# Patient Record
Sex: Female | Born: 1955
Health system: Southern US, Community
[De-identification: ages and names within clinical notes are randomized; demographics above are authoritative.]

## PROBLEM LIST (undated history)

## (undated) DIAGNOSIS — E785 Hyperlipidemia, unspecified: Secondary | ICD-10-CM

## (undated) DIAGNOSIS — K219 Gastro-esophageal reflux disease without esophagitis: Secondary | ICD-10-CM

## (undated) DIAGNOSIS — I2510 Atherosclerotic heart disease of native coronary artery without angina pectoris: Secondary | ICD-10-CM

## (undated) DIAGNOSIS — N3010 Interstitial cystitis (chronic) without hematuria: Secondary | ICD-10-CM

## (undated) DIAGNOSIS — E039 Hypothyroidism, unspecified: Secondary | ICD-10-CM

## (undated) DIAGNOSIS — I639 Cerebral infarction, unspecified: Secondary | ICD-10-CM

## (undated) DIAGNOSIS — I219 Acute myocardial infarction, unspecified: Secondary | ICD-10-CM

## (undated) DIAGNOSIS — F319 Bipolar disorder, unspecified: Secondary | ICD-10-CM

## (undated) DIAGNOSIS — Z972 Presence of dental prosthetic device (complete) (partial): Secondary | ICD-10-CM

## (undated) DIAGNOSIS — I10 Essential (primary) hypertension: Secondary | ICD-10-CM

## (undated) DIAGNOSIS — Z974 Presence of external hearing-aid: Secondary | ICD-10-CM

## (undated) HISTORY — PX: HYSTEROTOMY: SHX1776

## (undated) HISTORY — PX: INTERSTIM IMPLANT PLACEMENT: SHX5130

## (undated) HISTORY — PX: APPENDECTOMY: SHX54

## (undated) HISTORY — PX: ABDOMINAL HYSTERECTOMY: SHX81

---

## 1998-01-16 ENCOUNTER — Ambulatory Visit (HOSPITAL_COMMUNITY): Admission: RE | Admit: 1998-01-16 | Discharge: 1998-01-16 | Payer: Self-pay | Admitting: *Deleted

## 2003-12-16 ENCOUNTER — Other Ambulatory Visit: Payer: Self-pay

## 2007-10-05 ENCOUNTER — Other Ambulatory Visit: Payer: Self-pay

## 2007-10-05 ENCOUNTER — Inpatient Hospital Stay: Payer: Self-pay | Admitting: Internal Medicine

## 2007-12-31 ENCOUNTER — Emergency Department: Payer: Self-pay | Admitting: Emergency Medicine

## 2008-04-23 ENCOUNTER — Inpatient Hospital Stay: Payer: Self-pay | Admitting: Unknown Physician Specialty

## 2008-05-06 ENCOUNTER — Ambulatory Visit: Payer: Self-pay | Admitting: Unknown Physician Specialty

## 2008-05-07 ENCOUNTER — Ambulatory Visit: Payer: Self-pay | Admitting: Unknown Physician Specialty

## 2008-05-27 ENCOUNTER — Ambulatory Visit: Payer: Self-pay | Admitting: Unknown Physician Specialty

## 2008-05-30 ENCOUNTER — Ambulatory Visit: Payer: Self-pay | Admitting: Psychiatry

## 2008-10-24 ENCOUNTER — Ambulatory Visit: Payer: Self-pay | Admitting: Psychiatry

## 2008-11-02 ENCOUNTER — Ambulatory Visit: Payer: Self-pay | Admitting: Psychiatry

## 2008-11-06 ENCOUNTER — Inpatient Hospital Stay: Payer: Self-pay | Admitting: Psychiatry

## 2009-02-08 ENCOUNTER — Other Ambulatory Visit: Payer: Self-pay | Admitting: Psychiatry

## 2009-08-11 ENCOUNTER — Ambulatory Visit: Payer: Self-pay | Admitting: Otolaryngology

## 2010-03-18 ENCOUNTER — Ambulatory Visit: Payer: Self-pay | Admitting: Otolaryngology

## 2010-07-04 ENCOUNTER — Ambulatory Visit: Payer: Self-pay | Admitting: Family Medicine

## 2010-09-07 ENCOUNTER — Emergency Department: Payer: Self-pay | Admitting: Emergency Medicine

## 2010-09-08 ENCOUNTER — Emergency Department: Payer: Self-pay | Admitting: Emergency Medicine

## 2010-12-13 ENCOUNTER — Other Ambulatory Visit: Payer: Self-pay

## 2011-01-21 ENCOUNTER — Inpatient Hospital Stay: Payer: Self-pay | Admitting: Psychiatry

## 2011-04-27 ENCOUNTER — Other Ambulatory Visit: Payer: Self-pay | Admitting: Anesthesiology

## 2011-10-26 ENCOUNTER — Other Ambulatory Visit: Payer: Self-pay | Admitting: Family Medicine

## 2011-10-26 LAB — COMPREHENSIVE METABOLIC PANEL
Albumin: 4.1 g/dL (ref 3.4–5.0)
Alkaline Phosphatase: 108 U/L (ref 50–136)
Anion Gap: 10 (ref 7–16)
BUN: 8 mg/dL (ref 7–18)
Bilirubin,Total: 0.4 mg/dL (ref 0.2–1.0)
Co2: 28 mmol/L (ref 21–32)
EGFR (Non-African Amer.): >60
Glucose: 82 mg/dL (ref 65–99)
Osmolality: 279 (ref 275–301)
Potassium: 4.6 mmol/L (ref 3.5–5.1)
Sodium: 141 mmol/L (ref 136–145)

## 2011-10-26 LAB — LIPID PANEL: Cholesterol: 293 mg/dL — ABNORMAL HIGH (ref 0–200)

## 2011-12-20 ENCOUNTER — Emergency Department: Payer: Self-pay | Admitting: Emergency Medicine

## 2012-02-22 ENCOUNTER — Ambulatory Visit: Payer: Self-pay

## 2012-06-14 ENCOUNTER — Emergency Department: Payer: Self-pay

## 2012-07-21 DIAGNOSIS — G5750 Tarsal tunnel syndrome, unspecified lower limb: Secondary | ICD-10-CM | POA: Insufficient documentation

## 2012-07-21 DIAGNOSIS — G622 Polyneuropathy due to other toxic agents: Secondary | ICD-10-CM | POA: Insufficient documentation

## 2012-07-21 DIAGNOSIS — G619 Inflammatory polyneuropathy, unspecified: Secondary | ICD-10-CM | POA: Insufficient documentation

## 2012-07-21 DIAGNOSIS — M722 Plantar fascial fibromatosis: Secondary | ICD-10-CM | POA: Insufficient documentation

## 2012-08-25 ENCOUNTER — Inpatient Hospital Stay: Payer: Self-pay | Admitting: Psychiatry

## 2012-08-25 LAB — DRUG SCREEN, URINE
Amphetamines, Ur Screen: NEGATIVE (ref ?–1000)
Cannabinoid 50 Ng, Ur ~~LOC~~: NEGATIVE (ref ?–50)
Cocaine Metabolite,Ur ~~LOC~~: NEGATIVE (ref ?–300)
Methadone, Ur Screen: NEGATIVE (ref ?–300)
Opiate, Ur Screen: NEGATIVE (ref ?–300)

## 2012-08-25 LAB — COMPREHENSIVE METABOLIC PANEL
Albumin: 4.2 g/dL (ref 3.4–5.0)
Alkaline Phosphatase: 142 U/L — ABNORMAL HIGH (ref 50–136)
Anion Gap: 5 — ABNORMAL LOW (ref 7–16)
BUN: 7 mg/dL (ref 7–18)
Chloride: 108 mmol/L — ABNORMAL HIGH (ref 98–107)
Co2: 27 mmol/L (ref 21–32)
Creatinine: 0.87 mg/dL (ref 0.60–1.30)
EGFR (Non-African Amer.): >60
Glucose: 108 mg/dL — ABNORMAL HIGH (ref 65–99)
Osmolality: 278 (ref 275–301)
Potassium: 4.1 mmol/L (ref 3.5–5.1)
SGPT (ALT): 23 U/L (ref 12–78)

## 2012-08-25 LAB — SALICYLATE LEVEL: Salicylates, Serum: <1.7 mg/dL

## 2012-08-25 LAB — URINALYSIS, COMPLETE
Bilirubin,UR: NEGATIVE
Blood: NEGATIVE
Glucose,UR: NEGATIVE mg/dL (ref 0–75)
Leukocyte Esterase: NEGATIVE
Nitrite: NEGATIVE
Ph: 7 (ref 4.5–8.0)
Protein: NEGATIVE
Squamous Epithelial: 3
WBC UR: 1 /HPF (ref 0–5)

## 2012-08-25 LAB — CBC
HCT: 39.1 % (ref 35.0–47.0)
MCH: 31.5 pg (ref 26.0–34.0)
MCHC: 33.2 g/dL (ref 32.0–36.0)
MCV: 95 fL (ref 80–100)
Platelet: 274 10*3/uL (ref 150–440)
WBC: 12.4 10*3/uL — ABNORMAL HIGH (ref 3.6–11.0)

## 2012-08-25 LAB — ETHANOL: Ethanol %: <0.003 % (ref 0.000–0.080)

## 2012-08-25 LAB — TSH: Thyroid Stimulating Horm: 4.39 u[IU]/mL

## 2012-08-25 LAB — ACETAMINOPHEN LEVEL: Acetaminophen: <2 ug/mL

## 2012-09-01 LAB — LITHIUM LEVEL: Lithium: 1.72 mmol/L

## 2012-09-01 LAB — LIPID PANEL
Cholesterol: 174 mg/dL (ref 0–200)
Ldl Cholesterol, Calc: 106 mg/dL — ABNORMAL HIGH (ref 0–100)
Triglycerides: 113 mg/dL (ref 0–200)
VLDL Cholesterol, Calc: 23 mg/dL (ref 5–40)

## 2012-09-04 LAB — BASIC METABOLIC PANEL
BUN: 15 mg/dL (ref 7–18)
Co2: 27 mmol/L (ref 21–32)
Creatinine: 0.9 mg/dL (ref 0.60–1.30)
EGFR (Non-African Amer.): >60
Glucose: 97 mg/dL (ref 65–99)
Osmolality: 278 (ref 275–301)
Sodium: 139 mmol/L (ref 136–145)

## 2013-01-02 DIAGNOSIS — M26609 Unspecified temporomandibular joint disorder, unspecified side: Secondary | ICD-10-CM | POA: Insufficient documentation

## 2013-01-19 DIAGNOSIS — Z720 Tobacco use: Secondary | ICD-10-CM | POA: Insufficient documentation

## 2013-02-11 ENCOUNTER — Emergency Department: Payer: Self-pay | Admitting: Unknown Physician Specialty

## 2013-05-25 ENCOUNTER — Inpatient Hospital Stay: Payer: Self-pay | Admitting: Psychiatry

## 2013-05-25 LAB — URINALYSIS, COMPLETE
Bacteria: NONE SEEN
Ketone: NEGATIVE
Leukocyte Esterase: NEGATIVE
Nitrite: NEGATIVE
Ph: 7 (ref 4.5–8.0)
Protein: NEGATIVE
Specific Gravity: 1.004 (ref 1.003–1.030)
WBC UR: 1 /HPF (ref 0–5)

## 2013-05-25 LAB — DRUG SCREEN, URINE
Amphetamines, Ur Screen: NEGATIVE (ref ?–1000)
Barbiturates, Ur Screen: NEGATIVE (ref ?–200)
Cannabinoid 50 Ng, Ur ~~LOC~~: NEGATIVE (ref ?–50)
Cocaine Metabolite,Ur ~~LOC~~: NEGATIVE (ref ?–300)
Methadone, Ur Screen: NEGATIVE (ref ?–300)

## 2013-05-25 LAB — COMPREHENSIVE METABOLIC PANEL
Albumin: 3.8 g/dL (ref 3.4–5.0)
BUN: 5 mg/dL — ABNORMAL LOW (ref 7–18)
Calcium, Total: 9.6 mg/dL (ref 8.5–10.1)
Creatinine: 0.7 mg/dL (ref 0.60–1.30)
EGFR (African American): >60
EGFR (Non-African Amer.): >60
Glucose: 96 mg/dL (ref 65–99)
Potassium: 4.3 mmol/L (ref 3.5–5.1)
SGOT(AST): 22 U/L (ref 15–37)
SGPT (ALT): 20 U/L (ref 12–78)
Sodium: 142 mmol/L (ref 136–145)

## 2013-05-25 LAB — ACETAMINOPHEN LEVEL: Acetaminophen: <2 ug/mL

## 2013-05-25 LAB — CBC
HCT: 37.5 % (ref 35.0–47.0)
HGB: 12.8 g/dL (ref 12.0–16.0)
MCH: 31.6 pg (ref 26.0–34.0)
MCHC: 34.1 g/dL (ref 32.0–36.0)
MCV: 93 fL (ref 80–100)
Platelet: 194 10*3/uL (ref 150–440)
RBC: 4.04 10*6/uL (ref 3.80–5.20)

## 2013-05-25 LAB — ETHANOL
Ethanol %: <0.003 % (ref 0.000–0.080)
Ethanol: <3 mg/dL

## 2013-05-25 LAB — LITHIUM LEVEL: Lithium: 0.31 mmol/L — ABNORMAL LOW

## 2013-05-25 LAB — TSH: Thyroid Stimulating Horm: 1.33 u[IU]/mL

## 2013-05-25 LAB — SALICYLATE LEVEL: Salicylates, Serum: 2.5 mg/dL

## 2013-05-29 LAB — LITHIUM LEVEL: Lithium: 1.52 mmol/L

## 2013-05-31 LAB — BASIC METABOLIC PANEL
BUN: 19 mg/dL — ABNORMAL HIGH (ref 7–18)
Calcium, Total: 9.4 mg/dL (ref 8.5–10.1)
Chloride: 109 mmol/L — ABNORMAL HIGH (ref 98–107)
Co2: 28 mmol/L (ref 21–32)
Glucose: 87 mg/dL (ref 65–99)
Osmolality: 281 (ref 275–301)
Sodium: 140 mmol/L (ref 136–145)

## 2013-05-31 LAB — LITHIUM LEVEL: Lithium: 1.26 mmol/L — ABNORMAL HIGH

## 2013-06-21 ENCOUNTER — Inpatient Hospital Stay: Payer: Self-pay | Admitting: Psychiatry

## 2013-06-21 LAB — SALICYLATE LEVEL: Salicylates, Serum: 2.8 mg/dL

## 2013-06-21 LAB — ETHANOL
Ethanol %: <0.003 % (ref 0.000–0.080)
Ethanol: <3 mg/dL

## 2013-06-21 LAB — CBC
HCT: 39.2 % (ref 35.0–47.0)
MCV: 92 fL (ref 80–100)
Platelet: 199 10*3/uL (ref 150–440)
RDW: 13.2 % (ref 11.5–14.5)
WBC: 8.7 10*3/uL (ref 3.6–11.0)

## 2013-06-21 LAB — COMPREHENSIVE METABOLIC PANEL
Albumin: 3.9 g/dL (ref 3.4–5.0)
Anion Gap: 2 — ABNORMAL LOW (ref 7–16)
BUN: 13 mg/dL (ref 7–18)
Bilirubin,Total: 0.4 mg/dL (ref 0.2–1.0)
Calcium, Total: 10 mg/dL (ref 8.5–10.1)
Co2: 26 mmol/L (ref 21–32)
Creatinine: 0.88 mg/dL (ref 0.60–1.30)
EGFR (African American): >60
EGFR (Non-African Amer.): >60
Osmolality: 272 (ref 275–301)
Potassium: 4 mmol/L (ref 3.5–5.1)
SGOT(AST): 19 U/L (ref 15–37)
Sodium: 136 mmol/L (ref 136–145)
Total Protein: 7.5 g/dL (ref 6.4–8.2)

## 2013-06-21 LAB — ACETAMINOPHEN LEVEL: Acetaminophen: <2 ug/mL

## 2013-06-21 LAB — URINALYSIS, COMPLETE
Bilirubin,UR: NEGATIVE
Ketone: NEGATIVE
Protein: NEGATIVE
Squamous Epithelial: 4
WBC UR: <1 /HPF (ref 0–5)

## 2013-06-21 LAB — DRUG SCREEN, URINE
Amphetamines, Ur Screen: NEGATIVE (ref ?–1000)
Barbiturates, Ur Screen: NEGATIVE (ref ?–200)
Benzodiazepine, Ur Scrn: NEGATIVE (ref ?–200)
Cannabinoid 50 Ng, Ur ~~LOC~~: NEGATIVE (ref ?–50)
Methadone, Ur Screen: NEGATIVE (ref ?–300)
Opiate, Ur Screen: NEGATIVE (ref ?–300)
Phencyclidine (PCP) Ur S: NEGATIVE (ref ?–25)

## 2013-06-21 LAB — MAGNESIUM: Magnesium: 2 mg/dL

## 2013-06-21 LAB — LITHIUM LEVEL: Lithium: 1.27 mmol/L — ABNORMAL HIGH

## 2013-06-23 LAB — LITHIUM LEVEL: Lithium: 0.34 mmol/L — ABNORMAL LOW

## 2013-10-26 DIAGNOSIS — F315 Bipolar disorder, current episode depressed, severe, with psychotic features: Secondary | ICD-10-CM | POA: Diagnosis not present

## 2013-11-08 DIAGNOSIS — IMO0001 Reserved for inherently not codable concepts without codable children: Secondary | ICD-10-CM | POA: Diagnosis not present

## 2013-11-08 DIAGNOSIS — F17200 Nicotine dependence, unspecified, uncomplicated: Secondary | ICD-10-CM | POA: Diagnosis not present

## 2013-11-08 DIAGNOSIS — M2550 Pain in unspecified joint: Secondary | ICD-10-CM | POA: Diagnosis not present

## 2013-11-08 DIAGNOSIS — E782 Mixed hyperlipidemia: Secondary | ICD-10-CM | POA: Diagnosis not present

## 2013-11-08 DIAGNOSIS — I10 Essential (primary) hypertension: Secondary | ICD-10-CM | POA: Diagnosis not present

## 2013-11-08 DIAGNOSIS — F339 Major depressive disorder, recurrent, unspecified: Secondary | ICD-10-CM | POA: Diagnosis not present

## 2013-11-09 ENCOUNTER — Other Ambulatory Visit: Payer: Self-pay | Admitting: Physician Assistant

## 2013-11-09 LAB — CBC WITH DIFFERENTIAL/PLATELET
BASOS PCT: 0.7 %
Basophil #: 0.1 10*3/uL (ref 0.0–0.1)
EOS PCT: 2.1 %
Eosinophil #: 0.2 10*3/uL (ref 0.0–0.7)
HCT: 35.5 % (ref 35.0–47.0)
HGB: 11.4 g/dL — AB (ref 12.0–16.0)
Lymphocyte #: 1.5 10*3/uL (ref 1.0–3.6)
Lymphocyte %: 19.7 %
MCH: 30.2 pg (ref 26.0–34.0)
MCHC: 32.1 g/dL (ref 32.0–36.0)
MCV: 94 fL (ref 80–100)
Monocyte #: 0.5 x10 3/mm (ref 0.2–0.9)
Monocyte %: 6.1 %
NEUTROS ABS: 5.6 10*3/uL (ref 1.4–6.5)
NEUTROS PCT: 71.4 %
PLATELETS: 193 10*3/uL (ref 150–440)
RBC: 3.77 10*6/uL — ABNORMAL LOW (ref 3.80–5.20)
RDW: 13.9 % (ref 11.5–14.5)
WBC: 7.8 10*3/uL (ref 3.6–11.0)

## 2013-11-09 LAB — COMPREHENSIVE METABOLIC PANEL
Albumin: 3.8 g/dL (ref 3.4–5.0)
Alkaline Phosphatase: 152 U/L — ABNORMAL HIGH
Anion Gap: 4 — ABNORMAL LOW (ref 7–16)
BUN: 11 mg/dL (ref 7–18)
Bilirubin,Total: 0.4 mg/dL (ref 0.2–1.0)
CHLORIDE: 107 mmol/L (ref 98–107)
CREATININE: 0.94 mg/dL (ref 0.60–1.30)
Calcium, Total: 9.8 mg/dL (ref 8.5–10.1)
Co2: 26 mmol/L (ref 21–32)
Glucose: 88 mg/dL (ref 65–99)
OSMOLALITY: 273 (ref 275–301)
Potassium: 4.4 mmol/L (ref 3.5–5.1)
SGOT(AST): 16 U/L (ref 15–37)
SGPT (ALT): 16 U/L (ref 12–78)
SODIUM: 137 mmol/L (ref 136–145)
Total Protein: 7.4 g/dL (ref 6.4–8.2)

## 2013-11-09 LAB — LIPID PANEL
CHOLESTEROL: 222 mg/dL — AB (ref 0–200)
HDL Cholesterol: 46 mg/dL (ref 40–60)
Ldl Cholesterol, Calc: 148 mg/dL — ABNORMAL HIGH (ref 0–100)
Triglycerides: 139 mg/dL (ref 0–200)
VLDL Cholesterol, Calc: 28 mg/dL (ref 5–40)

## 2013-11-09 LAB — LITHIUM LEVEL: LITHIUM: 0.87 mmol/L

## 2013-11-09 LAB — SEDIMENTATION RATE: Erythrocyte Sed Rate: 34 mm/hr — ABNORMAL HIGH (ref 0–30)

## 2013-11-09 LAB — TSH: Thyroid Stimulating Horm: 1.32 u[IU]/mL

## 2013-11-10 DIAGNOSIS — I10 Essential (primary) hypertension: Secondary | ICD-10-CM | POA: Diagnosis not present

## 2013-11-10 DIAGNOSIS — R079 Chest pain, unspecified: Secondary | ICD-10-CM | POA: Diagnosis not present

## 2013-11-10 DIAGNOSIS — R52 Pain, unspecified: Secondary | ICD-10-CM | POA: Diagnosis not present

## 2013-11-10 DIAGNOSIS — E785 Hyperlipidemia, unspecified: Secondary | ICD-10-CM | POA: Diagnosis not present

## 2013-11-10 DIAGNOSIS — R0789 Other chest pain: Secondary | ICD-10-CM | POA: Diagnosis not present

## 2013-11-10 LAB — TROPONIN I
Troponin-I: 0.04 ng/mL
Troponin-I: 0.63 ng/mL — ABNORMAL HIGH

## 2013-11-10 LAB — BASIC METABOLIC PANEL
Anion Gap: 5 — ABNORMAL LOW (ref 7–16)
BUN: 15 mg/dL (ref 7–18)
CHLORIDE: 108 mmol/L — AB (ref 98–107)
CO2: 22 mmol/L (ref 21–32)
CREATININE: 0.92 mg/dL (ref 0.60–1.30)
Calcium, Total: 9.1 mg/dL (ref 8.5–10.1)
GLUCOSE: 97 mg/dL (ref 65–99)
OSMOLALITY: 271 (ref 275–301)
POTASSIUM: 4.1 mmol/L (ref 3.5–5.1)
Sodium: 135 mmol/L — ABNORMAL LOW (ref 136–145)

## 2013-11-10 LAB — CK-MB: CK-MB: 10.7 ng/mL — AB (ref 0.5–3.6)

## 2013-11-10 LAB — LIPASE, BLOOD: LIPASE: 239 U/L (ref 73–393)

## 2013-11-10 LAB — CBC
HCT: 36.6 % (ref 35.0–47.0)
HGB: 12 g/dL (ref 12.0–16.0)
MCH: 31 pg (ref 26.0–34.0)
MCHC: 32.8 g/dL (ref 32.0–36.0)
MCV: 95 fL (ref 80–100)
PLATELETS: 214 10*3/uL (ref 150–440)
RBC: 3.86 10*6/uL (ref 3.80–5.20)
RDW: 14.2 % (ref 11.5–14.5)
WBC: 10.1 10*3/uL (ref 3.6–11.0)

## 2013-11-10 LAB — CK TOTAL AND CKMB (NOT AT ARMC)
CK, TOTAL: 55 U/L
CK-MB: 1.3 ng/mL (ref 0.5–3.6)

## 2013-11-11 ENCOUNTER — Inpatient Hospital Stay: Payer: Self-pay | Admitting: Specialist

## 2013-11-11 DIAGNOSIS — I10 Essential (primary) hypertension: Secondary | ICD-10-CM | POA: Diagnosis not present

## 2013-11-11 DIAGNOSIS — R0789 Other chest pain: Secondary | ICD-10-CM | POA: Diagnosis not present

## 2013-11-11 DIAGNOSIS — E039 Hypothyroidism, unspecified: Secondary | ICD-10-CM | POA: Diagnosis present

## 2013-11-11 DIAGNOSIS — Z886 Allergy status to analgesic agent status: Secondary | ICD-10-CM | POA: Diagnosis not present

## 2013-11-11 DIAGNOSIS — F17200 Nicotine dependence, unspecified, uncomplicated: Secondary | ICD-10-CM | POA: Diagnosis present

## 2013-11-11 DIAGNOSIS — I2510 Atherosclerotic heart disease of native coronary artery without angina pectoris: Secondary | ICD-10-CM | POA: Diagnosis not present

## 2013-11-11 DIAGNOSIS — F319 Bipolar disorder, unspecified: Secondary | ICD-10-CM | POA: Diagnosis present

## 2013-11-11 DIAGNOSIS — R079 Chest pain, unspecified: Secondary | ICD-10-CM

## 2013-11-11 DIAGNOSIS — E785 Hyperlipidemia, unspecified: Secondary | ICD-10-CM | POA: Diagnosis not present

## 2013-11-11 DIAGNOSIS — R52 Pain, unspecified: Secondary | ICD-10-CM | POA: Diagnosis not present

## 2013-11-11 DIAGNOSIS — I214 Non-ST elevation (NSTEMI) myocardial infarction: Secondary | ICD-10-CM | POA: Diagnosis not present

## 2013-11-11 DIAGNOSIS — I2582 Chronic total occlusion of coronary artery: Secondary | ICD-10-CM | POA: Diagnosis present

## 2013-11-11 LAB — LIPID PANEL
Cholesterol: 190 mg/dL (ref 0–200)
HDL Cholesterol: 40 mg/dL (ref 40–60)
LDL CHOLESTEROL, CALC: 113 mg/dL — AB (ref 0–100)
TRIGLYCERIDES: 183 mg/dL (ref 0–200)
VLDL Cholesterol, Calc: 37 mg/dL (ref 5–40)

## 2013-11-11 LAB — CK-MB
CK-MB: 33.7 ng/mL — AB (ref 0.5–3.6)
CK-MB: 49.5 ng/mL — ABNORMAL HIGH (ref 0.5–3.6)

## 2013-11-11 LAB — TROPONIN I: Troponin-I: 2.6 ng/mL — ABNORMAL HIGH

## 2013-11-12 LAB — BASIC METABOLIC PANEL
ANION GAP: 1 — AB (ref 7–16)
BUN: 17 mg/dL (ref 7–18)
CALCIUM: 9.4 mg/dL (ref 8.5–10.1)
CHLORIDE: 106 mmol/L (ref 98–107)
CREATININE: 1.07 mg/dL (ref 0.60–1.30)
Co2: 29 mmol/L (ref 21–32)
EGFR (African American): >60
EGFR (Non-African Amer.): 57 — ABNORMAL LOW
Glucose: 91 mg/dL (ref 65–99)
Osmolality: 273 (ref 275–301)
Potassium: 4 mmol/L (ref 3.5–5.1)
Sodium: 136 mmol/L (ref 136–145)

## 2013-11-12 LAB — CBC
HCT: 35.4 % (ref 35.0–47.0)
HGB: 11.6 g/dL — ABNORMAL LOW (ref 12.0–16.0)
MCH: 30.6 pg (ref 26.0–34.0)
MCHC: 32.7 g/dL (ref 32.0–36.0)
MCV: 94 fL (ref 80–100)
Platelet: 211 10*3/uL (ref 150–440)
RBC: 3.78 10*6/uL — ABNORMAL LOW (ref 3.80–5.20)
RDW: 14.5 % (ref 11.5–14.5)
WBC: 9.5 10*3/uL (ref 3.6–11.0)

## 2013-11-29 DIAGNOSIS — I10 Essential (primary) hypertension: Secondary | ICD-10-CM | POA: Diagnosis not present

## 2013-11-29 DIAGNOSIS — I2510 Atherosclerotic heart disease of native coronary artery without angina pectoris: Secondary | ICD-10-CM | POA: Diagnosis not present

## 2013-12-13 DIAGNOSIS — F17200 Nicotine dependence, unspecified, uncomplicated: Secondary | ICD-10-CM | POA: Diagnosis not present

## 2013-12-13 DIAGNOSIS — F339 Major depressive disorder, recurrent, unspecified: Secondary | ICD-10-CM | POA: Diagnosis not present

## 2013-12-13 DIAGNOSIS — IMO0001 Reserved for inherently not codable concepts without codable children: Secondary | ICD-10-CM | POA: Diagnosis not present

## 2013-12-13 DIAGNOSIS — I10 Essential (primary) hypertension: Secondary | ICD-10-CM | POA: Diagnosis not present

## 2013-12-13 DIAGNOSIS — I2510 Atherosclerotic heart disease of native coronary artery without angina pectoris: Secondary | ICD-10-CM | POA: Diagnosis not present

## 2013-12-13 DIAGNOSIS — M2550 Pain in unspecified joint: Secondary | ICD-10-CM | POA: Diagnosis not present

## 2013-12-13 DIAGNOSIS — E782 Mixed hyperlipidemia: Secondary | ICD-10-CM | POA: Diagnosis not present

## 2013-12-20 ENCOUNTER — Encounter: Payer: Self-pay | Admitting: Cardiology

## 2013-12-20 DIAGNOSIS — F315 Bipolar disorder, current episode depressed, severe, with psychotic features: Secondary | ICD-10-CM | POA: Diagnosis not present

## 2013-12-20 DIAGNOSIS — I252 Old myocardial infarction: Secondary | ICD-10-CM | POA: Diagnosis not present

## 2013-12-20 DIAGNOSIS — Z5189 Encounter for other specified aftercare: Secondary | ICD-10-CM | POA: Diagnosis not present

## 2013-12-24 DIAGNOSIS — M79609 Pain in unspecified limb: Secondary | ICD-10-CM | POA: Diagnosis not present

## 2013-12-24 DIAGNOSIS — IMO0001 Reserved for inherently not codable concepts without codable children: Secondary | ICD-10-CM | POA: Diagnosis not present

## 2014-01-14 ENCOUNTER — Encounter: Payer: Self-pay | Admitting: Cardiology

## 2014-01-14 DIAGNOSIS — Z5189 Encounter for other specified aftercare: Secondary | ICD-10-CM | POA: Diagnosis not present

## 2014-01-14 DIAGNOSIS — I252 Old myocardial infarction: Secondary | ICD-10-CM | POA: Diagnosis not present

## 2014-01-25 DIAGNOSIS — F315 Bipolar disorder, current episode depressed, severe, with psychotic features: Secondary | ICD-10-CM | POA: Diagnosis not present

## 2014-02-04 DIAGNOSIS — I2510 Atherosclerotic heart disease of native coronary artery without angina pectoris: Secondary | ICD-10-CM | POA: Diagnosis not present

## 2014-02-04 DIAGNOSIS — E785 Hyperlipidemia, unspecified: Secondary | ICD-10-CM | POA: Diagnosis not present

## 2014-02-04 DIAGNOSIS — I10 Essential (primary) hypertension: Secondary | ICD-10-CM | POA: Diagnosis not present

## 2014-02-05 DIAGNOSIS — F315 Bipolar disorder, current episode depressed, severe, with psychotic features: Secondary | ICD-10-CM | POA: Diagnosis not present

## 2014-02-13 ENCOUNTER — Encounter: Payer: Self-pay | Admitting: Cardiology

## 2014-02-13 DIAGNOSIS — I252 Old myocardial infarction: Secondary | ICD-10-CM | POA: Diagnosis not present

## 2014-02-13 DIAGNOSIS — Z5189 Encounter for other specified aftercare: Secondary | ICD-10-CM | POA: Diagnosis not present

## 2014-03-14 DIAGNOSIS — I10 Essential (primary) hypertension: Secondary | ICD-10-CM | POA: Diagnosis not present

## 2014-03-14 DIAGNOSIS — F17200 Nicotine dependence, unspecified, uncomplicated: Secondary | ICD-10-CM | POA: Diagnosis not present

## 2014-03-14 DIAGNOSIS — E782 Mixed hyperlipidemia: Secondary | ICD-10-CM | POA: Diagnosis not present

## 2014-03-14 DIAGNOSIS — F339 Major depressive disorder, recurrent, unspecified: Secondary | ICD-10-CM | POA: Diagnosis not present

## 2014-03-14 DIAGNOSIS — M2550 Pain in unspecified joint: Secondary | ICD-10-CM | POA: Diagnosis not present

## 2014-03-14 DIAGNOSIS — I2510 Atherosclerotic heart disease of native coronary artery without angina pectoris: Secondary | ICD-10-CM | POA: Diagnosis not present

## 2014-03-15 ENCOUNTER — Other Ambulatory Visit: Payer: Self-pay | Admitting: Physician Assistant

## 2014-03-15 LAB — CBC WITH DIFFERENTIAL/PLATELET
BASOS ABS: 0.1 10*3/uL (ref 0.0–0.1)
BASOS PCT: 0.7 %
EOS ABS: 0.2 10*3/uL (ref 0.0–0.7)
EOS PCT: 2.1 %
HCT: 33.1 % — ABNORMAL LOW (ref 35.0–47.0)
HGB: 10.6 g/dL — AB (ref 12.0–16.0)
LYMPHS ABS: 1.6 10*3/uL (ref 1.0–3.6)
Lymphocyte %: 20.4 %
MCH: 30.5 pg (ref 26.0–34.0)
MCHC: 32.2 g/dL (ref 32.0–36.0)
MCV: 95 fL (ref 80–100)
MONOS PCT: 5.6 %
Monocyte #: 0.4 x10 3/mm (ref 0.2–0.9)
NEUTROS ABS: 5.5 10*3/uL (ref 1.4–6.5)
Neutrophil %: 71.2 %
Platelet: 226 10*3/uL (ref 150–440)
RBC: 3.49 10*6/uL — ABNORMAL LOW (ref 3.80–5.20)
RDW: 15.2 % — ABNORMAL HIGH (ref 11.5–14.5)
WBC: 7.7 10*3/uL (ref 3.6–11.0)

## 2014-03-15 LAB — COMPREHENSIVE METABOLIC PANEL
ALBUMIN: 3.4 g/dL (ref 3.4–5.0)
AST: 21 U/L (ref 15–37)
Alkaline Phosphatase: 150 U/L — ABNORMAL HIGH
Anion Gap: 2 — ABNORMAL LOW (ref 7–16)
BUN: 8 mg/dL (ref 7–18)
Bilirubin,Total: 0.6 mg/dL (ref 0.2–1.0)
CALCIUM: 9.2 mg/dL (ref 8.5–10.1)
CO2: 29 mmol/L (ref 21–32)
Chloride: 109 mmol/L — ABNORMAL HIGH (ref 98–107)
Creatinine: 1.01 mg/dL (ref 0.60–1.30)
Glucose: 84 mg/dL (ref 65–99)
OSMOLALITY: 277 (ref 275–301)
POTASSIUM: 4 mmol/L (ref 3.5–5.1)
SGPT (ALT): 20 U/L
SODIUM: 140 mmol/L (ref 136–145)
Total Protein: 7 g/dL (ref 6.4–8.2)

## 2014-03-15 LAB — CK-MB: CK-MB: 1.4 ng/mL (ref 0.5–3.6)

## 2014-03-15 LAB — LIPID PANEL
Cholesterol: 207 mg/dL — ABNORMAL HIGH (ref 0–200)
HDL Cholesterol: 51 mg/dL (ref 40–60)
LDL CHOLESTEROL, CALC: 128 mg/dL — AB (ref 0–100)
Triglycerides: 142 mg/dL (ref 0–200)
VLDL Cholesterol, Calc: 28 mg/dL (ref 5–40)

## 2014-03-15 LAB — TSH: THYROID STIMULATING HORM: 3.32 u[IU]/mL

## 2014-03-15 LAB — SEDIMENTATION RATE: Erythrocyte Sed Rate: 39 mm/hr — ABNORMAL HIGH (ref 0–30)

## 2014-03-15 LAB — LITHIUM LEVEL: LITHIUM: 1.32 mmol/L — AB

## 2014-03-16 ENCOUNTER — Encounter: Payer: Self-pay | Admitting: Cardiology

## 2014-03-16 DIAGNOSIS — I252 Old myocardial infarction: Secondary | ICD-10-CM | POA: Diagnosis not present

## 2014-03-16 DIAGNOSIS — Z5189 Encounter for other specified aftercare: Secondary | ICD-10-CM | POA: Diagnosis not present

## 2014-03-18 DIAGNOSIS — Z5189 Encounter for other specified aftercare: Secondary | ICD-10-CM | POA: Diagnosis not present

## 2014-03-18 DIAGNOSIS — I252 Old myocardial infarction: Secondary | ICD-10-CM | POA: Diagnosis not present

## 2014-03-20 DIAGNOSIS — I252 Old myocardial infarction: Secondary | ICD-10-CM | POA: Diagnosis not present

## 2014-03-20 DIAGNOSIS — Z5189 Encounter for other specified aftercare: Secondary | ICD-10-CM | POA: Diagnosis not present

## 2014-03-22 DIAGNOSIS — I252 Old myocardial infarction: Secondary | ICD-10-CM | POA: Diagnosis not present

## 2014-03-22 DIAGNOSIS — Z5189 Encounter for other specified aftercare: Secondary | ICD-10-CM | POA: Diagnosis not present

## 2014-03-28 DIAGNOSIS — F315 Bipolar disorder, current episode depressed, severe, with psychotic features: Secondary | ICD-10-CM | POA: Diagnosis not present

## 2014-04-03 DIAGNOSIS — K5900 Constipation, unspecified: Secondary | ICD-10-CM | POA: Diagnosis not present

## 2014-04-03 DIAGNOSIS — K219 Gastro-esophageal reflux disease without esophagitis: Secondary | ICD-10-CM | POA: Diagnosis not present

## 2014-04-10 ENCOUNTER — Emergency Department: Payer: Self-pay | Admitting: Emergency Medicine

## 2014-04-10 DIAGNOSIS — R110 Nausea: Secondary | ICD-10-CM | POA: Diagnosis not present

## 2014-04-10 DIAGNOSIS — F17200 Nicotine dependence, unspecified, uncomplicated: Secondary | ICD-10-CM | POA: Diagnosis not present

## 2014-04-10 DIAGNOSIS — R51 Headache: Secondary | ICD-10-CM | POA: Diagnosis not present

## 2014-04-10 DIAGNOSIS — G43909 Migraine, unspecified, not intractable, without status migrainosus: Secondary | ICD-10-CM | POA: Diagnosis not present

## 2014-04-16 ENCOUNTER — Ambulatory Visit: Payer: Self-pay | Admitting: Physician Assistant

## 2014-04-16 DIAGNOSIS — Z1231 Encounter for screening mammogram for malignant neoplasm of breast: Secondary | ICD-10-CM | POA: Diagnosis not present

## 2014-04-24 ENCOUNTER — Other Ambulatory Visit: Payer: Self-pay | Admitting: Physician Assistant

## 2014-04-24 DIAGNOSIS — D649 Anemia, unspecified: Secondary | ICD-10-CM | POA: Diagnosis not present

## 2014-04-24 LAB — CBC WITH DIFFERENTIAL/PLATELET
BASOS PCT: 0.9 %
Basophil #: 0.1 10*3/uL (ref 0.0–0.1)
EOS ABS: 0.4 10*3/uL (ref 0.0–0.7)
Eosinophil %: 4.6 %
HCT: 33.9 % — ABNORMAL LOW (ref 35.0–47.0)
HGB: 10.8 g/dL — ABNORMAL LOW (ref 12.0–16.0)
LYMPHS ABS: 1.3 10*3/uL (ref 1.0–3.6)
Lymphocyte %: 14.4 %
MCH: 30.3 pg (ref 26.0–34.0)
MCHC: 31.9 g/dL — ABNORMAL LOW (ref 32.0–36.0)
MCV: 95 fL (ref 80–100)
Monocyte #: 0.4 x10 3/mm (ref 0.2–0.9)
Monocyte %: 4.9 %
NEUTROS ABS: 6.6 10*3/uL — AB (ref 1.4–6.5)
NEUTROS PCT: 75.2 %
Platelet: 207 10*3/uL (ref 150–440)
RBC: 3.57 10*6/uL — AB (ref 3.80–5.20)
RDW: 14.9 % — AB (ref 11.5–14.5)
WBC: 8.8 10*3/uL (ref 3.6–11.0)

## 2014-04-24 LAB — FOLATE: FOLIC ACID: 39.3 ng/mL (ref 3.1–100.0)

## 2014-04-24 LAB — FERRITIN: FERRITIN (ARMC): 8 ng/mL (ref 8–388)

## 2014-04-24 LAB — IRON AND TIBC
IRON: 39 ug/dL — AB (ref 50–170)
Iron Bind.Cap.(Total): 434 ug/dL (ref 250–450)
Iron Saturation: 9 %
UNBOUND IRON-BIND. CAP.: 395 ug/dL

## 2014-04-25 DIAGNOSIS — F315 Bipolar disorder, current episode depressed, severe, with psychotic features: Secondary | ICD-10-CM | POA: Diagnosis not present

## 2014-05-03 DIAGNOSIS — K5900 Constipation, unspecified: Secondary | ICD-10-CM | POA: Diagnosis not present

## 2014-05-03 DIAGNOSIS — D509 Iron deficiency anemia, unspecified: Secondary | ICD-10-CM | POA: Diagnosis not present

## 2014-05-03 DIAGNOSIS — M109 Gout, unspecified: Secondary | ICD-10-CM | POA: Diagnosis not present

## 2014-05-03 DIAGNOSIS — J019 Acute sinusitis, unspecified: Secondary | ICD-10-CM | POA: Diagnosis not present

## 2014-05-03 DIAGNOSIS — I10 Essential (primary) hypertension: Secondary | ICD-10-CM | POA: Diagnosis not present

## 2014-05-03 DIAGNOSIS — I2510 Atherosclerotic heart disease of native coronary artery without angina pectoris: Secondary | ICD-10-CM | POA: Diagnosis not present

## 2014-05-09 ENCOUNTER — Ambulatory Visit: Payer: Self-pay | Admitting: Gastroenterology

## 2014-05-09 DIAGNOSIS — Z886 Allergy status to analgesic agent status: Secondary | ICD-10-CM | POA: Diagnosis not present

## 2014-05-09 DIAGNOSIS — K219 Gastro-esophageal reflux disease without esophagitis: Secondary | ICD-10-CM | POA: Diagnosis not present

## 2014-05-09 DIAGNOSIS — K5900 Constipation, unspecified: Secondary | ICD-10-CM | POA: Diagnosis not present

## 2014-05-09 DIAGNOSIS — Z8249 Family history of ischemic heart disease and other diseases of the circulatory system: Secondary | ICD-10-CM | POA: Diagnosis not present

## 2014-05-09 DIAGNOSIS — D649 Anemia, unspecified: Secondary | ICD-10-CM | POA: Diagnosis not present

## 2014-05-09 DIAGNOSIS — D128 Benign neoplasm of rectum: Secondary | ICD-10-CM | POA: Diagnosis not present

## 2014-05-09 DIAGNOSIS — D126 Benign neoplasm of colon, unspecified: Secondary | ICD-10-CM | POA: Diagnosis not present

## 2014-05-09 DIAGNOSIS — E785 Hyperlipidemia, unspecified: Secondary | ICD-10-CM | POA: Diagnosis not present

## 2014-05-09 DIAGNOSIS — K449 Diaphragmatic hernia without obstruction or gangrene: Secondary | ICD-10-CM | POA: Diagnosis not present

## 2014-05-09 DIAGNOSIS — Z8489 Family history of other specified conditions: Secondary | ICD-10-CM | POA: Diagnosis not present

## 2014-05-09 DIAGNOSIS — Z800 Family history of malignant neoplasm of digestive organs: Secondary | ICD-10-CM | POA: Diagnosis not present

## 2014-05-09 DIAGNOSIS — R12 Heartburn: Secondary | ICD-10-CM | POA: Diagnosis not present

## 2014-05-09 DIAGNOSIS — K620 Anal polyp: Secondary | ICD-10-CM | POA: Diagnosis not present

## 2014-05-09 DIAGNOSIS — F319 Bipolar disorder, unspecified: Secondary | ICD-10-CM | POA: Diagnosis not present

## 2014-05-09 DIAGNOSIS — Z823 Family history of stroke: Secondary | ICD-10-CM | POA: Diagnosis not present

## 2014-05-09 DIAGNOSIS — Z1211 Encounter for screening for malignant neoplasm of colon: Secondary | ICD-10-CM | POA: Diagnosis not present

## 2014-05-10 LAB — PATHOLOGY REPORT

## 2014-05-20 DIAGNOSIS — R062 Wheezing: Secondary | ICD-10-CM | POA: Diagnosis not present

## 2014-05-20 DIAGNOSIS — J209 Acute bronchitis, unspecified: Secondary | ICD-10-CM | POA: Diagnosis not present

## 2014-05-20 DIAGNOSIS — J019 Acute sinusitis, unspecified: Secondary | ICD-10-CM | POA: Diagnosis not present

## 2014-05-27 ENCOUNTER — Ambulatory Visit: Payer: Self-pay | Admitting: Physician Assistant

## 2014-05-27 DIAGNOSIS — R0602 Shortness of breath: Secondary | ICD-10-CM | POA: Diagnosis not present

## 2014-05-27 DIAGNOSIS — R062 Wheezing: Secondary | ICD-10-CM | POA: Diagnosis not present

## 2014-05-27 DIAGNOSIS — J209 Acute bronchitis, unspecified: Secondary | ICD-10-CM | POA: Diagnosis not present

## 2014-05-27 DIAGNOSIS — R05 Cough: Secondary | ICD-10-CM | POA: Diagnosis not present

## 2014-06-06 DIAGNOSIS — R062 Wheezing: Secondary | ICD-10-CM | POA: Diagnosis not present

## 2014-06-06 DIAGNOSIS — J209 Acute bronchitis, unspecified: Secondary | ICD-10-CM | POA: Diagnosis not present

## 2014-06-06 DIAGNOSIS — Z23 Encounter for immunization: Secondary | ICD-10-CM | POA: Diagnosis not present

## 2014-07-08 DIAGNOSIS — F17210 Nicotine dependence, cigarettes, uncomplicated: Secondary | ICD-10-CM | POA: Diagnosis not present

## 2014-07-08 DIAGNOSIS — D485 Neoplasm of uncertain behavior of skin: Secondary | ICD-10-CM | POA: Diagnosis not present

## 2014-07-08 DIAGNOSIS — R05 Cough: Secondary | ICD-10-CM | POA: Diagnosis not present

## 2014-07-08 DIAGNOSIS — R062 Wheezing: Secondary | ICD-10-CM | POA: Diagnosis not present

## 2014-07-22 DIAGNOSIS — J449 Chronic obstructive pulmonary disease, unspecified: Secondary | ICD-10-CM | POA: Diagnosis not present

## 2014-07-22 DIAGNOSIS — F17211 Nicotine dependence, cigarettes, in remission: Secondary | ICD-10-CM | POA: Diagnosis not present

## 2014-07-22 DIAGNOSIS — Z23 Encounter for immunization: Secondary | ICD-10-CM | POA: Diagnosis not present

## 2014-08-20 DIAGNOSIS — I10 Essential (primary) hypertension: Secondary | ICD-10-CM | POA: Diagnosis not present

## 2014-08-20 DIAGNOSIS — E782 Mixed hyperlipidemia: Secondary | ICD-10-CM | POA: Diagnosis not present

## 2014-08-20 DIAGNOSIS — I2510 Atherosclerotic heart disease of native coronary artery without angina pectoris: Secondary | ICD-10-CM | POA: Diagnosis not present

## 2014-10-03 DIAGNOSIS — F3340 Major depressive disorder, recurrent, in remission, unspecified: Secondary | ICD-10-CM | POA: Diagnosis not present

## 2014-10-03 DIAGNOSIS — Z124 Encounter for screening for malignant neoplasm of cervix: Secondary | ICD-10-CM | POA: Diagnosis not present

## 2014-10-03 DIAGNOSIS — D509 Iron deficiency anemia, unspecified: Secondary | ICD-10-CM | POA: Diagnosis not present

## 2014-10-03 DIAGNOSIS — F17211 Nicotine dependence, cigarettes, in remission: Secondary | ICD-10-CM | POA: Diagnosis not present

## 2014-10-03 DIAGNOSIS — N898 Other specified noninflammatory disorders of vagina: Secondary | ICD-10-CM | POA: Diagnosis not present

## 2014-10-03 DIAGNOSIS — R300 Dysuria: Secondary | ICD-10-CM | POA: Diagnosis not present

## 2014-10-03 DIAGNOSIS — Z0001 Encounter for general adult medical examination with abnormal findings: Secondary | ICD-10-CM | POA: Diagnosis not present

## 2014-10-03 DIAGNOSIS — E782 Mixed hyperlipidemia: Secondary | ICD-10-CM | POA: Diagnosis not present

## 2014-10-03 DIAGNOSIS — S39012A Strain of muscle, fascia and tendon of lower back, initial encounter: Secondary | ICD-10-CM | POA: Diagnosis not present

## 2014-10-06 DIAGNOSIS — I200 Unstable angina: Secondary | ICD-10-CM | POA: Diagnosis not present

## 2014-10-22 DIAGNOSIS — F410 Panic disorder [episodic paroxysmal anxiety] without agoraphobia: Secondary | ICD-10-CM | POA: Diagnosis not present

## 2014-10-24 ENCOUNTER — Other Ambulatory Visit: Payer: Self-pay | Admitting: Physician Assistant

## 2014-10-24 DIAGNOSIS — Z0000 Encounter for general adult medical examination without abnormal findings: Secondary | ICD-10-CM | POA: Diagnosis not present

## 2014-10-24 DIAGNOSIS — E611 Iron deficiency: Secondary | ICD-10-CM | POA: Diagnosis not present

## 2014-10-24 DIAGNOSIS — E039 Hypothyroidism, unspecified: Secondary | ICD-10-CM | POA: Diagnosis not present

## 2014-10-24 DIAGNOSIS — Z23 Encounter for immunization: Secondary | ICD-10-CM | POA: Diagnosis not present

## 2014-12-04 ENCOUNTER — Emergency Department: Admit: 2014-12-04 | Disposition: A | Discharge status: Home / Self Care | Payer: Self-pay | Admitting: Student

## 2014-12-04 DIAGNOSIS — S60032A Contusion of left middle finger without damage to nail, initial encounter: Secondary | ICD-10-CM | POA: Diagnosis not present

## 2014-12-04 DIAGNOSIS — M79642 Pain in left hand: Secondary | ICD-10-CM | POA: Diagnosis not present

## 2014-12-04 DIAGNOSIS — S60042A Contusion of left ring finger without damage to nail, initial encounter: Secondary | ICD-10-CM | POA: Diagnosis not present

## 2014-12-04 DIAGNOSIS — M79645 Pain in left finger(s): Secondary | ICD-10-CM | POA: Diagnosis not present

## 2014-12-04 DIAGNOSIS — Z720 Tobacco use: Secondary | ICD-10-CM | POA: Diagnosis not present

## 2014-12-04 DIAGNOSIS — S6992XA Unspecified injury of left wrist, hand and finger(s), initial encounter: Secondary | ICD-10-CM | POA: Diagnosis not present

## 2014-12-04 DIAGNOSIS — S60022A Contusion of left index finger without damage to nail, initial encounter: Secondary | ICD-10-CM | POA: Diagnosis not present

## 2014-12-06 NOTE — H&P (Signed)
PATIENT NAME:  Carol [name], GATHERS MR#:  [id number] DATE OF BIRTH:  [birth year]  DATE OF ADMISSION:  06/22/2013  IDENTIFYING INFORMATION AND CHIEF COMPLAINT: A 59 year old woman with a history of recurrent depression versus bipolar disorder with possible posttraumatic stress disorder admitted to the hospital because of a suicide attempt.   CHIEF COMPLAINT: "I just got real overwhelmed."   HISTORY OF PRESENT ILLNESS: Information obtained from the patient and the chart. The patient felt like she was getting overwhelmed by stress. A most acute stress is that apparently she is involved with a next-door neighbor, somehow providing care for someone in the family, got upset with her over something. It is kind of hard to put together from her vague description. Additionally, she has the judicial hearing for her disability coming up this week which she has been worried about a great deal. The patient took an overdose of multiple medications. She describes it to me now as seeing herself doing it implying that it was somehow out of her control or that she was dissociating, although she admits that she had been having suicidal thoughts. Mood stays down and depressed with a lot of anxiety. Sleep is chronically poor. Tends to have a very hopeless outlook upon things. She has been going to her outpatient treatment when she can afford the gas to do it. She takes her medication regularly. May not have been fully compliant with her therapy as much as they would like. It does not appear that there have been any acute significant changes to her psychiatric medication.   PAST PSYCHIATRIC HISTORY: Long history of depression problems going back decades. Used to be seen by Dr. Thurmond Butts then stop seeing him for outpatient treatment. Has had multiple hospitalizations. Positive past history of overdoses and suicide attempts. Past diagnosis used to be depression and possibly PTSD. More recently has been changed to bipolar disorder, although I am  not entirely clear why since I do not see any history of any manic episodes. She has been on multiple medications with antipsychotics and mood stabilizers emphasized in the last few years. She has a hard time telling me what if anything has really been helpful for her before.   SUBSTANCE ABUSE HISTORY: The patient has overdosed on prescription medicine before but denies that she abuses drugs. Denies that she drinks alcohol.   FAMILY HISTORY: Positive for depression.   SOCIAL HISTORY: She lives with her husband. Neither of them are working. They live on her husband's Social Security check. The patient has applied for disability. It sounds like they are having some conflict with some neighbors.   PAST MEDICAL HISTORY: The patient has hypothyroidism, high blood pressure, dyslipidemia.   CURRENT MEDICATIONS:  1.  Propranolol 10 mg 3 times a day. 2.  Abilify 5 mg once a day. 3.  Lisinopril 5 mg once a day. 4.  Amitriptyline 50 mg at night. 5.  Lithium 300 mg in the morning and 600 mg at night. 6.  Quetiapine 300 mg at night. 7.  Levothyroxine 100 mcg per day. 8.  Simvastatin 20 mg per day.   ALLERGIES: ASPIRIN, MORPHINE, SEPTRA AND VIOXX.   REVIEW OF SYSTEMS: Recent depression. At the moment, she denies acute suicidal intent or desire. Denies homicidal ideation. Not reporting psychotic symptoms. Not having any other specific physical complaints right now.   MENTAL STATUS EXAMINATION: A somewhat disheveled woman who looks older than her stated age. Cooperative with the interview. Good eye contact. Psychomotor activity a little bit slow. Speech  easy to understand, normal volume. Affect is a little bit blunted, not severely so. Mood stated as being better. Thoughts are lucid with no evidence of loosening of associations or delusions. Denies auditory or visual hallucinations. Denies suicidal or homicidal ideation. Shows insight and judgment that have recently been quite impaired, but a little bit  better right now. Intelligence probably average to low average. Alert and oriented.   PHYSICAL EXAMINATION: GENERAL: The patient does not appear to be in any acute distress.  SKIN: She has a couple of scabs on her arms, but no acute skin lesions.  HEENT: Pupils equal and reactive. Face symmetric. Oral mucosa dry. Dentition poor.  MUSCULOSKELETAL: Full range of motion at extremities.  NEUROLOGIC: Gait within normal limits. Strength and reflexes normal and symmetric throughout. Cranial nerves symmetric and normal.  LUNGS: Clear without wheezes.  HEART: Regular rate and rhythm.  ABDOMEN: Soft, nontender. Normal bowel sounds.  VITAL SIGNS: Temperature 98.7, pulse 68, respirations  18, blood pressure 161/73.   LABORATORY RESULTS: Drug screen positive for tricyclics, as would be expected. Lithium level 1.27. Magnesium level 2. TSH low at 0.42. Alcohol undetected. Chemistry shows elevated chloride at 108, elevated alkaline phosphatase 160, otherwise normal. CBC all normal. Urinalysis unremarkable.   ASSESSMENT: A 59 year old woman with recurrent depression versus bipolar disorder, easily overwhelmed, took an overdose, now no longer endorsing suicidal ideation, but still with chronic depression and anxiety and poor coping skills.   TREATMENT PLAN: Continue suicide precautions. Continue current medicine. Reviewed her medicine history with her I suggest that we try restarting an actual antidepressant medicine such as Celexa. She agreed to the plan. I think this is relatively safe. I do not see any past history of antidepressants induced mania. Engage her in daily individual and group psychotherapy. The patient wants to be discharged by next Wednesday, which should not be a problem.   DIAGNOSIS, PRINCIPAL AND PRIMARY:  AXIS I: Bipolar disorder type II currently depressed.   SECONDARY DIAGNOSES: AXIS I: Post traumatic stress disorder. AXIS II: Deferred.  AXIS III: Hypertension, hypothyroid,  dyslipidemia.  AXIS IV: Severe from chronic poor functioning, chronic financial problems.  AXIS V: Functioning at time of evaluation is 30.  ____________________________ Gonzella Lex, MD jtc:sb D: 06/22/2013 13:30:33 ET T: 06/22/2013 13:53:22 ET JOB#: [id number]  cc: Gonzella Lex, MD, <Dictator> Gonzella Lex MD ELECTRONICALLY SIGNED 06/22/2013 17:15

## 2014-12-06 NOTE — H&P (Signed)
PATIENT NAME:  [name], Carol MR#:  [id number] DATE OF BIRTH:  1956-02-07  DATE OF ADMISSION:  05/25/2013  REFERRING PHYSICIAN:  Emergency Room MD  ATTENDING PHYSICIAN:  Orson Slick, MD  IDENTIFYING DATA:  Ms. Stroschein is a 59 year old female with history of bipolar disorder.   CHIEF COMPLAINT: " I am suicidal".   HISTORY OF PRESENT ILLNESS: Ms. Vickers reports that she has been doing well since her last hospitalization in January 2014. In the past 2 months, however, she became increasingly depressed, and in the past 2 weeks she had intrusive thoughts of suicide with a plan to overdose on pills. She reports that on multiple occasions, when taking her pills, she would have strong urges to overdose. She decided to come to the hospital. She reports poor sleep, decreased appetite, anhedonia, feeling of guilt, hopelessness, worthlessness, social isolation, poor memory and concentration, crying spells, and intrusive suicidal thoughts. She also reports increased anxiety with panic attacks and episodes of really low energy. This is due to a combination of insomnia and anxiety. She is under considerable stress. The financial situation is bad. Her husband receives disability, but she is still waiting. She has a Chief Executive Officer and is awaiting a court date, but it is unknown when this could happen. She denies psychotic symptoms, denies symptoms suggestive of bipolar mania, and has been compliant with her medications including lithium. She denies alcohol, illicit drug or prescription pill abuse.   PAST PSYCHIATRIC HISTORY: There are several psychiatric hospitalizations for depression and suicide attempts, they were by overdose. She has a history of alcoholism, but has been sober for the past 5 or 6 years. She has been tried on multiple medication regimens, but believes that lithium works best for her.   FAMILY PSYCHIATRIC HISTORY:  Son with anxiety.   PAST MEDICAL HISTORY: Fibromyalgia, hypothyroidism, migraine  headaches, hypertension.   ALLERGIES:  ASPIRIN, MORPHINE, SEPTRA, VIOXX.   MEDICATIONS ON ADMISSION:  Abilify 5 mg daily, Estrace 1 mg daily, Synthroid 125 mcg daily, lisinopril 5 mg daily, lithium 600 mg twice daily, Pravachol 20 mg daily, propranolol 10 mg 3 times daily, Seroquel 200 mg at bedtime, Restoril 15 mg at bedtime, tramadol 50 mg every 6 hours as needed for pain.   SOCIAL HISTORY:  She is unable to work due to mental and physical problems. She is married and lives with her husband, and awaits disability. She obtained her medications through [facility].   REVIEW OF SYSTEMS:   CONSTITUTIONAL: No fevers or chills. No weight changes.  EYES: No double or blurred vision.  EARS, NOSE, THROAT:  No hearing loss.  RESPIRATORY: No shortness of breath or cough.  CARDIOVASCULAR: No chest pain or orthopnea.  GASTROINTESTINAL: No abdominal pain, nausea, vomiting or diarrhea.  GENITOURINARY: No incontinence or frequency.  ENDOCRINE: No heat or cold intolerance.  LYMPHATIC: No anemia or easy bruising.  INTEGUMENTARY: No acne or rash.  MUSCULOSKELETAL: Positive for fibromyalgia.  NEUROLOGIC: No tingling or weakness.  PSYCHIATRIC: See history of present illness for details.   PHYSICAL EXAMINATION: VITAL SIGNS: Blood pressure 136/64, pulse 62, respirations 20, temperature 98.6.  GENERAL: This is a well-developed female in no acute distress.  HEENT: The pupils are equal, round and reactive to light. Sclerae anicteric.  NECK: Supple. No thyromegaly.  LUNGS: Clear to auscultation. No dullness to percussion.  HEART: Regular rhythm and rate. No murmurs, rubs or gallops.  ABDOMEN: Soft, nontender, nondistended. Positive bowel sounds.  MUSCULOSKELETAL: Normal muscle strength in all extremities.  SKIN: No rashes or bruises.  LYMPHATIC: No cervical adenopathy.  NEUROLOGIC: Cranial nerves II through XII are intact.   LABORATORY DATA: Chemistries are within normal limits. Blood  alcohol level is zero. LFTs within normal limits. TSH 1.33. Lithium 0.31. Urine tox screen negative for substances. CBC within normal limits. Urinalysis is not suggestive of urinary tract infection. Serum acetaminophen and salicylates are low.   MENTAL STATUS EXAMINATION ON ADMISSION: The patient is alert and oriented to person, place, time and situation. She is pleasant, polite and cooperative. She recognizes me from previous admission. She maintains good eye contact. Her speech is of normal rhythm, rate and volume. Mood is depressed, with flat affect. Thought process is logical and goal-oriented. Thought content: She denies suicidal or homicidal ideation at the moment, but reports intrusive, strong thoughts of suicide by overdose over the past 2 weeks. There are no delusions or paranoia. There are no auditory or visual hallucinations. Her cognition is grossly intact. She registers 3 out of 3 and recalls 3 out of 3 objects after 5 minutes. She can spell WORLD forward and backward. She knows the current president. Her insight and judgment are fair.   SUICIDE RISK ASSESSMENT ON ADMISSION: This is a patient with a history of depression, anxiety, mood instability, multiple suicide attempts, who is here for worsening of depression and suicidal thoughts in the context of severe social stressors. She is at increased risk of suicide.   INITIAL DIAGNOSES:  AXIS I: Bipolar disorder, depressed. Panic disorder, without agoraphobia. Post-traumatic stress disorder. Alcohol dependence, in full sustained remission.  AXIS II:  Deferred.  AXIS III:  Dyslipidemia, fibromyalgia, migraine headaches, hypothyroidism, hypertension.  AXIS IV:  Mental illness, financial problems.  AXIS V:  GAF on admission 25.   PLAN: The patient was admitted to [place] unit for safety, stabilization and medication management. She was initially placed on suicide precautions, and was closely monitored  for any unsafe behaviors. She underwent full psychiatric and risk assessment. She received pharmacotherapy, individual and group psychotherapy, substance abuse counseling, and support from therapeutic milieu.   1.  Suicidal ideation:  The patient is able to contract for safety.   2.  Mood:  Will continue all medications as prescribed in the community. Her lithium level was subtherapeutic. Will just restart 600 mg twice daily. Will continue Abilify.   3.  Anxiety: The patient suffers severe anxiety, with nightmares and flashbacks, stemming from past abuse. She is taking Minipress for that, with great success.   4. Medical:  We will continue antihypertensive, cholesterol-lowering drugs, and Synthroid as prescribed in the community.   5.  Disposition:  She will return to home.    ____________________________ Herma Ard B. Bary Leriche, MD jbp:mr D: 05/25/2013 82:64:15 ET T: 05/25/2013 22:13:47 ET JOB#: [id number]  cc: Makaylin Carlo B. Bary Leriche, MD, <Dictator> Clovis Fredrickson MD ELECTRONICALLY SIGNED 05/29/2013 21:00

## 2014-12-06 NOTE — Discharge Summary (Signed)
PATIENT NAME:  Carol [name], Carol [name] MR#:  [id number] DATE OF BIRTH:  October 13, 1955  DATE OF ADMISSION:  06/21/2013 DATE OF DISCHARGE:  06/27/2013   HOSPITAL COURSE: See dictated history and physical for details of admission. A 59 year old woman, admitted to the hospital after making a suicide attempt. On first admission, she was tearful, somewhat confused and agitated. She did not engage in any dangerous or suicidal behavior here in the hospital. She was treated with medication management, as well as individual and group psychotherapy. Medicines were adjusted, based on her history of prior response and current symptoms. By the time of discharge, she was taking a combination of a low dose of Abilify, Celexa 40 mg a day, lithium carbonate 300 mg in the morning and 600 mg at night and Seroquel 300 mg night, plus her medical medicine. She was not having any side effects. She participated in groups appropriately. Totally denied any suicidal ideation. Showed appropriate social interaction. The patient was counseled about the importance of staying stable on her medication and avoiding extra drama in her life, which she agreed to. She has follow-up in the community that has already been arranged at [facility].   MENTAL STATUS EXAM AT DISCHARGE: Neatly groomed, casually dressed woman, looks her stated age, cooperative with the interview. Good eye contact, normal psychomotor activity. Speech normal in rate, tone and volume. Affect euthymic, reactive, appropriate to the situation. Mood stated as good. Thoughts are lucid, without any loosening of associations or delusions. Denies auditory or visual hallucinations. Denies suicidal or homicidal ideation. Shows good insight and judgment. Normal intelligence. Alert and oriented x4.   DISCHARGE MEDICATIONS: Amitriptyline 50 mg at night, Abilify 5 mg in the morning, citalopram 40 mg per day, levothyroxine 100 mcg per day, lisinopril 5 mg per day, lithium carbonate 300 mg in the morning and 600  at night, propranolol 10 mg 3 times a day, Seroquel 300 mg at night, Zocor 20 mg at bedtime.   LABORATORY RESULTS: Lithium level checked prior to discharge showed a lithium level of 1.18. T4 level was normal at 6.3. Lithium on admission was low at 0.34. EKG was normal. Urinalysis normal. Drug screen positive for tricyclics. TSH was low at 0.42. Salicylates normal. CBC normal. Chemistry panel shows an elevated alkaline phosphatase at 160, otherwise normal.   DISPOSITION: Discharge home with her husband, follow up at [facility].   DIAGNOSIS, PRINCIPAL AND PRIMARY:  AXIS I: Bipolar disorder type 2, depressed.   SECONDARY DIAGNOSES: AXIS I: No further.   AXIS II: Borderline features.   AXIS III: Dyslipidemia, high blood pressure, chronic pain, chronic tremor, hypothyroid.   AXIS IV: Moderate to severe chronic social stress.   AXIS V: Functioning at time of discharge 60.  ____________________________ Gonzella Lex, MD jtc:cg D: 06/27/2013 22:38:21 ET T: 06/27/2013 23:31:32 ET JOB#: [id number]  cc: Gonzella Lex, MD, <Dictator> Gonzella Lex MD ELECTRONICALLY SIGNED 06/28/2013 11:13

## 2014-12-06 NOTE — H&P (Signed)
PATIENT NAME:  Carol [name], Carol [name] MR#:  [id number] DATE OF BIRTH:  Dec 21, 1955  DATE OF ADMISSION:  08/25/2012  DATE OF ADMISSION: Dependent effecting   PRIMARY CARE PHYSICIAN: Lenise Arena, MD.  ATTENDING PHYSICIAN:  Orson Slick, M.D.   IDENTIFYING DATA: The patient is a 59 year old female with history of bipolar disorder.   CHIEF COMPLAINT: "I have strange thoughts".   HISTORY OF PRESENT ILLNESS: The patient has been under considerable stress over the holidays and has been increasingly unstable. She had a manic episode a month and a half ago and then another to one last week. She then crashes into severe depression. She came to the hospital after she developed thoughts of hurting her husband, killing her three dogs and herself. the thoughts are very strange and ego-dystonic to her. She need help. She reports poor sleep, decreased appetite, anhedonia, feeling of guilt, hopelessness, worthlessness, decreased memory and concentration, crying spells, social isolation, and suicidal and homicidal thoughts. There is also heightened anxiety with panic attacks. She describes two types of panic attacks. One is associated with episodes of exhaustion. The patient reports good compliance with treatment and doctor's visits. She denies alcohol, illicit drugs, or prescription pill abuse.   PAST PSYCHIATRIC HISTORY: She has been hospitalized several times at [facility] for worsening of depression and suicide attempts by overdose. She has a history of alcoholism but has not been drinking since her discharge from [facility] in 2009. She has been tried on different medications but has been stable on her current regimen.   FAMILY PSYCHIATRIC HISTORY: Son with panic attacks.   PAST MEDICAL HISTORY: 1. Migraine headaches.  2. Fibromyalgia.  3. Hypothyroidism.   ALLERGIES: ASPIRIN, MORPHINE, SEPTRA, VIOXX.   MEDICATIONS ON ADMISSION:  1. Vitamin D2 50,000 units  once monthly. 2. Tramadol 100 mg 3 times daily. 3. Seroquel 200 mg at bedtime. 4. Propranolol 10 mg 3 times daily. 5. Pravastatin 20 mg daily. 6. Lithium 300 mg in the morning and 600 mg in the evening.  7. Lisinopril 10 mg daily.  8. Synthroid 125 mcg daily.  9. Estradiol 1 mg daily.  10. Xanax 0.5 mg daily as needed for panic attacks. 11. Abilify 5 mg daily.   SOCIAL HISTORY: She lives with her husband who is very support. She has two children and three grandchildren. She has an associate degree in business administration and they used to work for [employer].  REVIEW OF SYSTEMS: CONSTITUTIONAL: No fevers or chills. No weight changes.  EYES: No double or blurred vision.  ENT: No hearing losses.  RESPIRATORY: No shortness of breath or cough.  CARDIOVASCULAR: No chest pain or orthopnea.  GASTROINTESTINAL: No abdominal pain, nausea, vomiting, or diarrhea.  GENITOURINARY: No incontinence or frequency.  ENDOCRINE: No heat or cold intolerance.  LYMPHATIC: No anemia or easy bruising.  INTEGUMENTARY: No acne or rash.  MUSCULOSKELETAL: No muscle or joint pain.  NEUROLOGIC: No tingling or weakness.  PSYCHIATRIC: See history of present illness for details.   PHYSICAL EXAMINATION:  VITAL SIGNS: Blood pressure 134/76, pulse 72, respirations 20, temperature 98.3.  GENERAL: This is a slender female in no acute distress.  HEENT: The pupils are equal, round, and reactive to light. Sclerae are anicteric.  NECK: Supple. No thyromegaly.  LUNGS: Clear to auscultation. No dullness to percussion.  HEART: Regular rhythm and rate. No murmurs, rubs, or gallops.  ABDOMEN: Soft, nontender, nondistended. Positive bowel sounds.  MUSCULOSKELETAL: Normal muscle strength in lower extremities.  LYMPHATIC: No cervical  adenopathy.  SKIN: No rashes or bruises.  NEUROLOGIC: Cranial nerves II through XII are intact.   LABORATORY DATA: Chemistries are within normal limits. Blood alcohol level is zero. LFTs normal limits  except for alkaline phosphatase 142. TSH 4.39, lithium 0.9. A urine tox screen positive for benzodiazepines. CBC within normal limits except for white blood count of 12.4. Urinalysis is not suggestive of urinary tract infection. Serum acetaminophen and salicylates are low.   MENTAL STATUS EXAMINATION ON ADMISSION: The patient is alert and oriented to person, place, time, and situation. She is pleasant, polite and cooperative. She maintains good eye contact. She is wearing hospital scrubs. Her speech is soft. Her mood is depressed with flat affect. Thought processing is logical and goal oriented. Thought content: She denies suicidal or homicidal ideations at the moment but was brought to the hospital after voicing thoughts of hurting herself, her husband and the dogs. There are no delusions or paranoia. There are no auditory or visual hallucinations. Her cognition is grossly intact. Her insight and judgment are fair.   SUICIDE RISK ASSESSMENT ON ADMISSION: This is a patient with a history of bipolar with frequent cycling mood instability and multiple suicide attempts in the past who is under considerable stress from family relations.   DIAGNOSES:  AXIS I: Bipolar affective disorder, most recent episode depressed, panic disorder, PTSD, alcohol dependence, in full sustained remission.  AXIS II: Deferred.  AXIS III: Dyslipidemia, fibromyalgia, migraine headaches, hypothyroidism.  AXIS IV: Mental illness, financial problems, family conflict.  AXIS V: GAF on admission: 25.   PLAN: The patient was admitted to [place] Unit for safety, stabilization and medication management. She was initially placed on suicide precautions and was closely monitored for any unsafe behaviors. She underwent full psychiatric and risk assessment. She received pharmacotherapy, individual and group psychotherapy, substance abuse counseling, and support from therapeutic milieu.   1.  Suicidal and homicidal ideation. The patient is able to contract for safety.  2. Mood: We will continue all medications as prescribed in the community:  Seroquel, lithium, Abilify and Xanax.  3. Medical: We will continue antihypertensives and Synthroid.   DISPOSITION: The patient will return to home.  ____________________________ Herma Ard B. Bary Leriche, MD jbp:jm D: 08/26/2012 14:53:18 ET T: 08/26/2012 16:28:30 ET JOB#: [id number]  cc: Yahmir Sokolov B. Bary Leriche, MD, <Dictator> Clovis Fredrickson MD ELECTRONICALLY SIGNED 08/28/2012 0:07

## 2014-12-07 NOTE — Consult Note (Signed)
   Present Illness 59 yo female with no prior cardiac history and history of hypertension and tobacco abuse who was admitted with chest pain. She has ruled in for a nstemi with canadian class iv angina. She is currently stable. EKG is unremarkable. She has not had pain like this in the past.   Physical Exam:  GEN no acute distress   HEENT PERRL, hearing intact to voice   NECK supple   RESP normal resp effort   CARD Regular rate and rhythm  Normal, S1, S2  No murmur   ABD denies tenderness  denies Flank Tenderness   LYMPH negative neck, negative axillae   EXTR negative cyanosis/clubbing, negative edema   SKIN normal to palpation   NEURO cranial nerves intact, negative Babinski R/L, motor/sensory function intact   PSYCH A+O to time, place, person   Review of Systems:  Subjective/Chief Complaint chest pain   General: Fatigue   Skin: No Complaints   ENT: No Complaints   Eyes: No Complaints   Neck: No Complaints   Respiratory: Short of breath   Cardiovascular: Chest pain or discomfort  Tightness   Gastrointestinal: No Complaints   Genitourinary: No Complaints   Vascular: No Complaints   Musculoskeletal: No Complaints   Neurologic: No Complaints   Hematologic: No Complaints   Endocrine: No Complaints   Psychiatric: Depression  Anxiety   Medications/Allergies Reviewed Medications/Allergies reviewed   EKG:  EKG NSR   Abnormal NSSTTW changes    Morphine: GI Distress  Septra: Blisters, Hives  Aspirin: Unknown  Vioxx: Unknown   Impression 59 yo female with history of bipolar disorder and no prior cardiac history who was admitted with chest pain. Ruled in for nstemi. Currrently stable on current meds inclusing asa, beta blockers and nitrates.   Plan 1. Continue current meds including propranolol, asa and topical nitrates 2. Risk and benefits or cardiac cath explained to patient and she agrees to proceed.  3. Further recs after cath   Electronic  Signatures: Teodoro Spray (MD)  (Signed 29-Mar-15 15:32)  Authored: General Aspect/Present Illness, History and Physical Exam, Review of System, Home Medications, EKG , Allergies, Impression/Plan   Last Updated: 29-Mar-15 15:32 by Teodoro Spray (MD)

## 2014-12-07 NOTE — Discharge Summary (Signed)
PATIENT NAME:  Carol [name], Carol [name] MR#:  [id number] DATE OF BIRTH:  Jun 22, 1956  DATE OF ADMISSION:  11/11/2013 DATE OF DISCHARGE:  11/12/2013  For detailed note, please see history and physical done on admission by Dr. Valentino Nose.   DIAGNOSES AT DISCHARGE: Non-ST-elevation myocardial infarction. Bipolar disorder.  Hypertension. Hyperlipidemia. Tobacco abuse. Hypothyroidism.   DISCHARGE DIET: The patient is being discharged on a low-sodium, low-fat diet.   ACTIVITY: As tolerated.   FOLLOWUP: Dr. Jordan Hawks in next 1-2 weeks.    DISCHARGE MEDICATIONS: Lisinopril 5 mg daily, amitriptyline 50 mg at bedtime, Celexa 40 mg daily, simvastatin 20 mg at bedtime, lithium 300 mg 2 tabs at bedtime, Seroquel 300 mg at bedtime,  Abilify 5 mg daily, propranolol 10 mg t.i.d., Synthroid 100 mcg daily, amoxicillin 500 mg q. 6 hours,  meloxicam 7.5 mg daily, Voltaren gel to be applied to the affected area, lithium 300 mg once daily in the morning, Plavix 75 mg daily.   CONSULTANTS DURING THE HOSPITAL COURSE: Dr. Jordan Hawks from cardiology.   PERTINENT STUDIES DONE DURING THE HOSPITAL COURSE:  A chest x-ray done on admission showing no acute cardiopulmonary disease. A cardiac catheterization done on 11/12/2013 showing ejection fraction to be 50%, significant single vessel coronary artery disease with 100% stenosis of the mid RCA, but with good collateral flow.   HOSPITAL COURSE: This is a 59 year old female with medical problems as mentioned above, presented to the hospital, with chest pain.   1. Non-ST-elevation myocardial infarction. This was likely the cause of the patient's chest pain as the patient ruled in by cardiac markers, as the troponins went up to as high as 2. She was admitted to the hospital, started on aspirin, Lovenox, maintained on her beta-blockers and statin and ACE inhibitor. A cardiology consult was obtained. The patient was seen by Dr. Jordan Hawks. The patient underwent a cardiac catheterization , which  showed significant single-vessel coronary artery disease with 100% stenosis of the mid RCA, although the patient had good collateral flow with no other further acute intervention needed. At this point, the patient is being discharged on medical management on Plavix, beta-blocker, statin, with close follow up with cardiology as an outpatient.  2. Hypertension. The patient remained hemodynamically stable. She will continue her propranolol and lisinopril.  3. Hyperlipidemia. The patient was maintained on her simvastatin. She will resume that.  4. Hypothyroidism. The patient was maintained on her Synthroid. She will resume that. 5. History of bipolar disorder. The patient was maintained on lithium, Seroquel and Abilify. She will resume that. A lithium level on admission was normal at 0.87.   The patient is a full code.   DISPOSITION: She is being discharged home.   TIME SPENT: Forty minutes.   ____________________________ Belia Heman. Verdell Carmine, MD vjs:tc D: 11/12/2013 14:19:57 ET T: 11/12/2013 20:49:25 ET JOB#: [id number]  cc: Belia Heman. Verdell Carmine, MD, <Dictator> Javier Docker. Ubaldo Glassing, MD  Henreitta Leber MD ELECTRONICALLY SIGNED 11/14/2013 14:52

## 2014-12-07 NOTE — H&P (Signed)
PATIENT NAME:  Carol [name], Carol [name] MR#:  [id number] DATE OF BIRTH:  1956-01-28  DATE OF ADMISSION:  11/10/2013  REFERRING PHYSICIAN:  Dr. Hinda Kehr and physician's assistant Delene Loll.  PRIMARY CARE PHYSICIAN:  Leta Baptist, PA-C.  CHIEF COMPLAINT:  Chest pain.   HISTORY OF PRESENT ILLNESS:  A 59 year old Caucasian female with history of hypertension, hyperlipidemia, presenting with chest pain.  She describes acute onset of chest pain occurring at rest, retrosternal in location and burning pressure in quality, 8 out of 10 in intensity, radiating to the left arm.  No worsening or relieving factors.  No prior anginal symptoms.  No prior chest pain symptoms.  She also describes associated shortness of breath.  Describes dyspnea on exertion with nausea, vomiting x 1, nonbloody, nonbilious emesis and diaphoresis.  Her symptoms have markedly improved while in the Emergency Department; however, still has minimal chest pain.  Otherwise, no further complaints.   REVIEW OF SYSTEMS:  CONSTITUTIONAL:  Denies fever, fatigue, weakness.  EYES:  Denies blurred vision, double vision, eye pain.  EARS, NOSE, THROAT:  Denies tinnitus, ear pain, hearing loss.  RESPIRATORY:  Denies cough, wheeze.  Positive shortness of breath as per above.  CARDIOVASCULAR:  Positive for chest pain as described above.  Denies any orthopnea, edema, palpitations.   GASTROINTESTINAL:  Denies abdominal pain.  Positive for nausea, vomiting.  GENITOURINARY:  Denies dysuria, hematuria.  ENDOCRINE:  Denies nocturia or thyroid problems.  HEMATOLOGIC AND LYMPHATIC:  Denies easy bruising, bleeding.  SKIN:  Denies rash or lesions.  MUSCULOSKELETAL:  Denies pain in neck, back, shoulders, knees, hips or arthritic symptoms.  NEUROLOGIC:  Denies paralysis, paresthesia.  PSYCHIATRIC:  Denies anxiety or depressive symptoms.   Otherwise, full review of systems performed by me is negative.   PAST MEDICAL HISTORY:  Hypothyroidism, hypertension,  hyperlipidemia, bipolar disorder, not otherwise specified, anxiety, not otherwise specified.   SOCIAL HISTORY:  Positive for tobacco usage.  Denies alcohol or drug usage.   FAMILY HISTORY:  Positive for coronary artery disease.   ALLERGIES:  ASPIRIN CAUSING RASH.  MORPHINE CAUSING HALLUCINATIONS, SEPTRA AND VIOXX.   HOME MEDICATIONS:  Meloxicam 7.5 mg by mouth daily, lisinopril 5 mg by mouth daily, propranolol 10 mg by mouth 3 times daily, amitriptyline 50 mg by mouth at bedtime, citalopram 40 mg by mouth daily, simvastatin 20 mg by mouth at bedtime, aripiprazole 5 mg by mouth daily, lithium 600 mg by mouth at bedtime, lithium 300 mg by mouth daily, quetiapine 300 mg by mouth at bedtime, Synthroid 100 mcg by mouth daily.   PHYSICAL EXAMINATION: VITAL SIGNS:  Temperature 98.2, heart rate 62, respirations 20, blood pressure 156/70, saturating 95% on room air.  Weight 71.7 kg, BMI of 30.9.  GENERAL:  Well-nourished, well-developed, Caucasian female, currently in no acute distress.  HEAD:  Normocephalic, atraumatic.  EYES:  Pupils equal, round and reactive to light.  Extraocular muscles intact.  No scleral icterus.   MOUTH:  Moist mucous membranes.  Dentition intact.  No abscess noted.   EAR, NOSE, THROAT:  Throat clear without exudates.  No external lesions.  NECK:  Supple.  No thyromegaly.  No nodules.  No JVD.  PULMONARY:  Clear to auscultation bilaterally without wheeze, rubs or rhonchi.  No use of accessory muscles.  Good respiratory effort.  CHEST:  Nontender to palpation.  CARDIOVASCULAR:  S1, S2, regular rate and rhythm.  No murmurs, rubs, or gallops.  No edema.  Pedal pulses 2+ bilaterally.  GASTROINTESTINAL:  Soft, nontender, nondistended.  No masses.  Positive bowel sounds.  No hepatosplenomegaly.  MUSCULOSKELETAL:  No swelling, clubbing, edema.  Range of motion full in all extremities.  NEUROLOGIC:  Cranial nerves II through XII intact.  No gross focal neurological deficits.  Sensation  intact.  Reflexes intact.  SKIN:  No ulcerations, lesions, rash, cyanosis.  Skin warm, dry.  Turgor intact.  PSYCHIATRIC:  Mood and affect within normal limits.  The patient is awake, alert, oriented x 3.  Insight and judgment intact.   LABORATORY DATA:  EKG performed, sinus bradycardia, heart rate 59, in lead III and aVF there is less than 0.5 mm concave ST elevation consistent with repolarization abnormality.  Remainder of laboratory data:  Sodium 135, potassium 4.1, chloride 108, bicarb 22, BUN 15, creatinine 0.92, glucose 97.  Troponin I 0.04.  WBC 10.1, hemoglobin 12, platelets 214.  Chest x-ray performed, no acute cardiopulmonary process.   ASSESSMENT AND PLAN:  A 59 year old Caucasian female with history of hypertension, hyperlipidemia, presenting with chest pain.  1.  Chest pain.  Admit to telemetry under observation status.  She has received aspirin.  She is to continue with aspirin and statin therapy.  Trend cardiac enzymes x 3 and nitroglycerin as needed for pain as well as Norco if required.  2.  Hyperlipidemia.  Continue statin therapy.  3.  Hypertension.  Continue lisinopril.  4.  Bipolar disorder, not otherwise specified.  Continue with lithium.  Her Lithium level was checked yesterday and within normal limits.  5.  Venous thromboembolism prophylaxis with heparin subQ. 6.  CODE STATUS:  THE PATIENT IS A FULL CODE.    TIME SPENT:  45 minutes.    ____________________________ Aaron Mose. Malva Diesing, MD dkh:ea D: 11/10/2013 21:23:11 ET T: 11/10/2013 23:00:43 ET JOB#: [id number]  cc: Aaron Mose. Ellyssa Zagal, MD, <Dictator> Merrick Feutz Woodfin Ganja MD ELECTRONICALLY SIGNED 11/11/2013 2:58

## 2015-02-03 ENCOUNTER — Emergency Department: Payer: Medicare Other

## 2015-02-03 ENCOUNTER — Observation Stay
Admission: EM | Admit: 2015-02-03 | Discharge: 2015-02-04 | Disposition: A | Discharge status: Home / Self Care | Payer: Medicare Other | Attending: Internal Medicine | Admitting: Internal Medicine

## 2015-02-03 DIAGNOSIS — I200 Unstable angina: Secondary | ICD-10-CM

## 2015-02-03 DIAGNOSIS — E785 Hyperlipidemia, unspecified: Secondary | ICD-10-CM | POA: Insufficient documentation

## 2015-02-03 DIAGNOSIS — E039 Hypothyroidism, unspecified: Secondary | ICD-10-CM | POA: Diagnosis not present

## 2015-02-03 DIAGNOSIS — F17200 Nicotine dependence, unspecified, uncomplicated: Secondary | ICD-10-CM | POA: Diagnosis not present

## 2015-02-03 DIAGNOSIS — Z720 Tobacco use: Secondary | ICD-10-CM | POA: Diagnosis not present

## 2015-02-03 DIAGNOSIS — R0602 Shortness of breath: Secondary | ICD-10-CM | POA: Diagnosis not present

## 2015-02-03 DIAGNOSIS — R0789 Other chest pain: Secondary | ICD-10-CM | POA: Diagnosis not present

## 2015-02-03 DIAGNOSIS — R918 Other nonspecific abnormal finding of lung field: Secondary | ICD-10-CM | POA: Diagnosis not present

## 2015-02-03 DIAGNOSIS — F319 Bipolar disorder, unspecified: Secondary | ICD-10-CM | POA: Insufficient documentation

## 2015-02-03 DIAGNOSIS — Z886 Allergy status to analgesic agent status: Secondary | ICD-10-CM | POA: Insufficient documentation

## 2015-02-03 DIAGNOSIS — I25110 Atherosclerotic heart disease of native coronary artery with unstable angina pectoris: Secondary | ICD-10-CM | POA: Diagnosis not present

## 2015-02-03 DIAGNOSIS — Z8249 Family history of ischemic heart disease and other diseases of the circulatory system: Secondary | ICD-10-CM | POA: Diagnosis not present

## 2015-02-03 DIAGNOSIS — Z79899 Other long term (current) drug therapy: Secondary | ICD-10-CM | POA: Insufficient documentation

## 2015-02-03 DIAGNOSIS — I10 Essential (primary) hypertension: Secondary | ICD-10-CM | POA: Diagnosis not present

## 2015-02-03 DIAGNOSIS — R079 Chest pain, unspecified: Secondary | ICD-10-CM | POA: Diagnosis not present

## 2015-02-03 HISTORY — DX: Bipolar disorder, unspecified: F31.9

## 2015-02-03 HISTORY — DX: Hypothyroidism, unspecified: E03.9

## 2015-02-03 HISTORY — DX: Hyperlipidemia, unspecified: E78.5

## 2015-02-03 HISTORY — DX: Essential (primary) hypertension: I10

## 2015-02-03 HISTORY — DX: Atherosclerotic heart disease of native coronary artery without angina pectoris: I25.10

## 2015-02-03 LAB — BASIC METABOLIC PANEL
Anion gap: 7 (ref 5–15)
BUN: 11 mg/dL (ref 6–20)
CHLORIDE: 109 mmol/L (ref 101–111)
CO2: 24 mmol/L (ref 22–32)
CREATININE: 0.89 mg/dL (ref 0.44–1.00)
Calcium: 9.8 mg/dL (ref 8.9–10.3)
GFR calc Af Amer: >60 mL/min (ref >60)
GFR calc non Af Amer: >60 mL/min (ref >60)
Glucose, Bld: 79 mg/dL (ref 65–99)
Potassium: 3.8 mmol/L (ref 3.5–5.1)
Sodium: 140 mmol/L (ref 135–145)

## 2015-02-03 LAB — CBC
HCT: 34.3 % — ABNORMAL LOW (ref 35.0–47.0)
HEMOGLOBIN: 11.3 g/dL — AB (ref 12.0–16.0)
MCH: 31.7 pg (ref 26.0–34.0)
MCHC: 33 g/dL (ref 32.0–36.0)
MCV: 96 fL (ref 80.0–100.0)
PLATELETS: 215 10*3/uL (ref 150–440)
RBC: 3.58 MIL/uL — AB (ref 3.80–5.20)
RDW: 14.8 % — ABNORMAL HIGH (ref 11.5–14.5)
WBC: 8.8 10*3/uL (ref 3.6–11.0)

## 2015-02-03 LAB — TROPONIN I

## 2015-02-03 LAB — CK: Total CK: 34 U/L — ABNORMAL LOW (ref 38–234)

## 2015-02-03 MED ORDER — ACETAMINOPHEN 325 MG PO TABS: 650.0000 mg | ORAL_TABLET | Freq: Four times a day (QID) | ORAL | Status: DC | PRN

## 2015-02-03 MED ORDER — NITROGLYCERIN 0.4 MG SL SUBL: 0.4000 mg | SUBLINGUAL_TABLET | SUBLINGUAL | Status: DC | PRN

## 2015-02-03 MED ORDER — HYDROCODONE-ACETAMINOPHEN 5-325 MG PO TABS: 1.0000 | ORAL_TABLET | ORAL | Status: DC | PRN

## 2015-02-03 MED ORDER — NITROGLYCERIN 0.4 MG SL SUBL
0.4000 mg | SUBLINGUAL_TABLET | Freq: Once | SUBLINGUAL | Status: AC
  Administered 2015-02-03: 0.4 mg via SUBLINGUAL

## 2015-02-03 MED ORDER — ENOXAPARIN SODIUM 80 MG/0.8ML ~~LOC~~ SOLN
70.0000 mg | Freq: Two times a day (BID) | SUBCUTANEOUS | Status: DC
  Administered 2015-02-04: 70 mg via SUBCUTANEOUS
  Filled 2015-02-03 (×3): qty 0.8

## 2015-02-03 MED ORDER — SODIUM CHLORIDE 0.9 % IJ SOLN
3.0000 mL | Freq: Two times a day (BID) | INTRAMUSCULAR | Status: DC
Start: 2015-02-03 — End: 2015-02-03

## 2015-02-03 MED ORDER — NITROGLYCERIN 0.4 MG SL SUBL
SUBLINGUAL_TABLET | SUBLINGUAL | Status: AC
  Administered 2015-02-03: 0.4 mg via SUBLINGUAL
  Filled 2015-02-03: qty 1

## 2015-02-03 MED ORDER — LEVOTHYROXINE SODIUM 100 MCG PO TABS
100.0000 ug | ORAL_TABLET | Freq: Every day | ORAL | Status: DC
  Administered 2015-02-04: 100 ug via ORAL
  Filled 2015-02-03 (×2): qty 1

## 2015-02-03 MED ORDER — PROPRANOLOL HCL 10 MG PO TABS
10.0000 mg | ORAL_TABLET | Freq: Three times a day (TID) | ORAL | Status: DC
Start: 2015-02-03 — End: 2015-02-04
  Administered 2015-02-03 – 2015-02-04 (×2): 10 mg via ORAL
  Filled 2015-02-03 (×5): qty 1

## 2015-02-03 MED ORDER — ALUM & MAG HYDROXIDE-SIMETH 200-200-20 MG/5ML PO SUSP
30.0000 mL | Freq: Four times a day (QID) | ORAL | Status: DC | PRN
  Administered 2015-02-03: 30 mL via ORAL
  Filled 2015-02-03: qty 30

## 2015-02-03 MED ORDER — LORATADINE 10 MG PO TABS
10.0000 mg | ORAL_TABLET | Freq: Every day | ORAL | Status: DC
  Administered 2015-02-04: 10 mg via ORAL
  Filled 2015-02-03: qty 1

## 2015-02-03 MED ORDER — ONDANSETRON HCL 4 MG PO TABS: 4.0000 mg | ORAL_TABLET | Freq: Four times a day (QID) | ORAL | Status: DC | PRN

## 2015-02-03 MED ORDER — CITALOPRAM HYDROBROMIDE 20 MG PO TABS
40.0000 mg | ORAL_TABLET | Freq: Every day | ORAL | Status: DC
  Administered 2015-02-04: 40 mg via ORAL
  Filled 2015-02-03: qty 2

## 2015-02-03 MED ORDER — NICOTINE 21 MG/24HR TD PT24
21.0000 mg | MEDICATED_PATCH | Freq: Every day | TRANSDERMAL | Status: DC
  Administered 2015-02-03 – 2015-02-04 (×3): 21 mg via TRANSDERMAL
  Filled 2015-02-03: qty 1

## 2015-02-03 MED ORDER — QUETIAPINE FUMARATE 300 MG PO TABS
300.0000 mg | ORAL_TABLET | Freq: Every day | ORAL | Status: DC
  Administered 2015-02-03: 300 mg via ORAL
  Filled 2015-02-03: qty 1

## 2015-02-03 MED ORDER — AMITRIPTYLINE HCL 50 MG PO TABS
50.0000 mg | ORAL_TABLET | Freq: Every day | ORAL | Status: DC
  Administered 2015-02-03: 50 mg via ORAL
  Filled 2015-02-03: qty 1

## 2015-02-03 MED ORDER — NICOTINE 21 MG/24HR TD PT24
MEDICATED_PATCH | TRANSDERMAL | Status: AC
  Administered 2015-02-03: 21 mg via TRANSDERMAL
  Filled 2015-02-03: qty 1

## 2015-02-03 MED ORDER — BUDESONIDE-FORMOTEROL FUMARATE 160-4.5 MCG/ACT IN AERO
1.0000 | INHALATION_SPRAY | Freq: Two times a day (BID) | RESPIRATORY_TRACT | Status: DC
  Administered 2015-02-03 – 2015-02-04 (×2): 1 via RESPIRATORY_TRACT
  Filled 2015-02-03: qty 6

## 2015-02-03 MED ORDER — SODIUM CHLORIDE 0.9 % IJ SOLN: 3.0000 mL | INTRAMUSCULAR | Status: DC | PRN

## 2015-02-03 MED ORDER — CLOPIDOGREL BISULFATE 75 MG PO TABS
75.0000 mg | ORAL_TABLET | Freq: Every day | ORAL | Status: DC
  Administered 2015-02-04: 75 mg via ORAL
  Filled 2015-02-03: qty 1

## 2015-02-03 MED ORDER — LISINOPRIL 5 MG PO TABS
5.0000 mg | ORAL_TABLET | Freq: Every day | ORAL | Status: DC
  Administered 2015-02-04: 5 mg via ORAL
  Filled 2015-02-03: qty 1

## 2015-02-03 MED ORDER — NITROGLYCERIN 2 % TD OINT
0.5000 [in_us] | TOPICAL_OINTMENT | Freq: Four times a day (QID) | TRANSDERMAL | Status: DC
  Administered 2015-02-03 – 2015-02-04 (×3): 0.5 [in_us] via TOPICAL
  Filled 2015-02-03 (×3): qty 1

## 2015-02-03 MED ORDER — LITHIUM CARBONATE 300 MG PO CAPS
300.0000 mg | ORAL_CAPSULE | Freq: Two times a day (BID) | ORAL | Status: DC
  Administered 2015-02-03: 600 mg via ORAL
  Administered 2015-02-04: 300 mg via ORAL
  Filled 2015-02-03: qty 2
  Filled 2015-02-03: qty 1

## 2015-02-03 MED ORDER — SENNOSIDES-DOCUSATE SODIUM 8.6-50 MG PO TABS: 1.0000 | ORAL_TABLET | Freq: Every evening | ORAL | Status: DC | PRN

## 2015-02-03 MED ORDER — ACETAMINOPHEN 650 MG RE SUPP: 650.0000 mg | Freq: Four times a day (QID) | RECTAL | Status: DC | PRN

## 2015-02-03 MED ORDER — SIMVASTATIN 20 MG PO TABS
20.0000 mg | ORAL_TABLET | Freq: Every day | ORAL | Status: DC
  Administered 2015-02-03: 20 mg via ORAL
  Filled 2015-02-03: qty 1

## 2015-02-03 MED ORDER — ONDANSETRON HCL 4 MG/2ML IJ SOLN: 4.0000 mg | Freq: Four times a day (QID) | INTRAMUSCULAR | Status: DC | PRN

## 2015-02-03 NOTE — Progress Notes (Signed)
   02/03/15 1600  Clinical Encounter Type  Visited With Patient and family together  Visit Type Initial  Spiritual Encounters  Spiritual Needs Prayer  Stress Factors  Patient Stress Factors Health changes  Family Stress Factors None identified   Faith Tradition: [employer] Status: alert oriented and in good spirits Family: Husband since 2012 by bedside Visit Assessment: Chaplain visited with patient and her husband. She shared that she may have had a heart attack for the 2nd time. She says that she is trying to relax. She said that she is awaiting   nitroGLYCERIN  for the pain.       She also shared that she has a rat terrier dog.  Chaplain and pastoral care can be reached by pager No. [phone] or by submitting an online request, 24x7

## 2015-02-03 NOTE — ED Provider Notes (Signed)
[facility] Emergency Department Provider Note  Time seen: 7:38 PM  I have reviewed the triage vital signs and the nursing notes.   HISTORY  Chief Complaint Chest Pain    HPI Carol [name] is a 59 y.o. female with a past medical history of CAD, hypertension, bipolar, hyperlipidemia, hypothyroidism, MI last year who presents the emergency department with chest pain. According to the patient she developed midsternal chest pain earlier this afternoon. She states this is the first chest pain she has had since her myocardial infarction one year ago. Patient states at that time she had a 100% blockage, but they did not stent. Patient sees Dr. Ubaldo Glassing. Patient describes her chest pressure as dull, pressure than sedation, moderate in severity currently. She ranks it a 9/10 at its maximum severity. Does state some shortness of breath with the chest pressure, but denies any nausea or diaphoresis.     Past Medical History  Diagnosis Date  . Coronary artery disease   . Hypertension   . Bipolar affective disorder   . Hyperlipidemia   . Hypothyroid     Patient Active Problem List   Diagnosis Date Noted  . Unstable angina 02/03/2015    History reviewed. No pertinent past surgical history.  No current outpatient prescriptions on file.  Allergies Asa  History reviewed. No pertinent family history.  Social History History  Substance Use Topics  . Smoking status: Current Every Day Smoker  . Smokeless tobacco: Never Used  . Alcohol Use: No    Review of Systems Constitutional: Negative for fever. Cardiovascular: Positive for chest pressure. Respiratory: Positive for shortness breath, now resolved. Gastrointestinal: Negative for abdominal pain, vomiting and diarrhea. Musculoskeletal: Negative for back pain.  10-point ROS otherwise negative.  ____________________________________________   PHYSICAL EXAM:  VITAL SIGNS: ED Triage Vitals  Enc Vitals Group      BP 02/03/15 1438 133/64 mmHg     Pulse Rate 02/03/15 1438 65     Resp 02/03/15 1530 18     Temp 02/03/15 1438 98.9 F (37.2 C)     Temp Source 02/03/15 1828 Oral     SpO2 02/03/15 1438 100 %     Weight 02/03/15 1438 155 lb (70.308 kg)     Height 02/03/15 1438 5' (1.524 m)     Head Cir --      Peak Flow --      Pain Score 02/03/15 1439 7     Pain Loc --      Pain Edu? --      Excl. in [place]? --     Constitutional: Alert and oriented. Well appearing ENT   Mouth/Throat: Mucous membranes are moist. Cardiovascular: Normal rate, regular rhythm. No murmur Respiratory: Normal respiratory effort without tachypnea nor retractions. Breath sounds are clear  Gastrointestinal: Soft and nontender. No distention.   Musculoskeletal: Nontender with normal range of motion in all extremities. No lower extremity tenderness or edema. Neurologic:  Normal speech and language. No gross focal neurologic deficits Skin:  Skin is warm, dry and intact.  Psychiatric: Mood and affect are normal. Speech and behavior are normal.  ____________________________________________    EKG  EKG reviewed and interpreted by myself. No STEMI.  ____________________________________________    RADIOLOGY  Chest x-ray shows a small nodule, otherwise within normal limits.  ____________________________________________   INITIAL IMPRESSION / ASSESSMENT AND PLAN / ED COURSE  Pertinent labs & imaging results that were available during my care of the patient were reviewed by me and  considered in my medical decision making (see chart for details).  Patient with largely normal labs, nodule on chest x-ray, within normal limits EKG who presents the emergency department with chest pain. I discussed the patient with Dr. Clayborn Bigness.  We will admit the patient for further workup and close monitoring. Patient agreeable to plan.  ____________________________________________   FINAL CLINICAL IMPRESSION(S) / ED DIAGNOSES  Chest  pain   Harvest Dark, MD 02/03/15 2227

## 2015-02-03 NOTE — H&P (Signed)
[facility] [place] NAME: Carol [name]    MR#:  [id number]  DATE OF BIRTH:  1956-06-20  DATE OF ADMISSION:  02/03/2015  PRIMARY CARE PHYSICIAN: Christie Nottingham., MD   REQUESTING/REFERRING PHYSICIAN: Dr. Kerman Passey   CHIEF COMPLAINT:   Chest pain HISTORY OF PRESENT ILLNESS:  Carol [name]  is a 60 y.o. female with a known history of CAD with a cardiac catheterization May 2015 showing 100% lesion with collaterals, essential hypertension and bipolar affective disorder who presents with above complaint. Patient says approximate noon this afternoon while she was walking on her porch she developed sudden onset of left arm pain which then proceeded to her chest. It was a 9 out of 10 at its worse. She called 911. Upon arrival to the R she received nitroglycerin her pain has now subsided. She has not had pain or pressure since May 2015. She does states she is compliant with her medications. Patient denies any aggravating factors. Patient does state that she shortness of breath associated with her pain.  PAST MEDICAL HISTORY:   Past Medical History  Diagnosis Date  . Coronary artery disease   . Hypertension   . Bipolar affective disorder   . Hyperlipidemia   . Hypothyroid     PAST SURGICAL HISTORY:  Appendectomy Hysterectomy Carpal tunnel surgery Tarsal surgery  SOCIAL HISTORY:   History  Substance Use Topics  . Smoking status: Current Every Day Smoker  . Smokeless tobacco: Never Used  . Alcohol Use: No    FAMILY HISTORY:  Positive for CAD  DRUG ALLERGIES:   Allergies  Allergen Reactions  . Asa [Aspirin] Hives     REVIEW OF SYSTEMS:  CONSTITUTIONAL: No fever, fatigue or weakness.  EYES: No blurred or double vision.  EARS, NOSE, AND THROAT: No tinnitus or ear pain.  RESPIRATORY: No cough, shortness of breath, wheezing or hemoptysis.  CARDIOVASCULAR: Positive chest pain associated with shortness of breath and left arm pain.  No orthopnea or edema  GASTROINTESTINAL: No nausea, vomiting, diarrhea or abdominal pain.  GENITOURINARY: No dysuria, hematuria.  ENDOCRINE: No polyuria, nocturia,  HEMATOLOGY: No anemia, easy bruising or bleeding SKIN: No rash or lesion. MUSCULOSKELETAL: No joint pain or arthritis.   NEUROLOGIC: No tingling, numbness, weakness. She has tardive dyskinesia PSYCHIATRY: She has bipolar affective disorder  MEDICATIONS AT HOME:   Prior to Admission medications   Medication Sig Start Date End Date Taking? Authorizing Provider  amitriptyline (ELAVIL) 50 MG tablet Take 50 mg by mouth at bedtime.   Yes Historical Provider, MD  budesonide-formoterol (SYMBICORT) 160-4.5 MCG/ACT inhaler Inhale 1 puff into the lungs 2 (two) times daily.   Yes Historical Provider, MD  cetirizine (ZYRTEC) 10 MG tablet Take 10 mg by mouth daily.   Yes Historical Provider, MD  citalopram (CELEXA) 40 MG tablet Take 40 mg by mouth daily.   Yes Historical Provider, MD  clopidogrel (PLAVIX) 75 MG tablet Take 75 mg by mouth daily.   Yes Historical Provider, MD  levothyroxine (SYNTHROID, LEVOTHROID) 100 MCG tablet Take 100 mcg by mouth daily.   Yes Historical Provider, MD  lisinopril (PRINIVIL,ZESTRIL) 5 MG tablet Take 5 mg by mouth daily.   Yes Historical Provider, MD  lithium carbonate 300 MG capsule Take 300-600 mg by mouth 2 (two) times daily. Pt takes one capsule in the morning and two at bedtime.   Yes Historical Provider, MD  lubiprostone (AMITIZA) 8 MCG capsule Take 8 mcg by mouth 2 (two)  times daily as needed for constipation.   Yes Historical Provider, MD  propranolol (INDERAL) 10 MG tablet Take 10 mg by mouth 3 (three) times daily.   Yes Historical Provider, MD  QUEtiapine (SEROQUEL) 300 MG tablet Take 300 mg by mouth at bedtime.   Yes Historical Provider, MD  simvastatin (ZOCOR) 20 MG tablet Take 20 mg by mouth at bedtime.   Yes Historical Provider, MD      VITAL SIGNS:  Blood pressure 141/76, pulse 65, temperature  98.9 F (37.2 C), resp. rate 18, height 5' (1.524 m), weight 70.308 kg (155 lb), SpO2 98 %.  PHYSICAL EXAMINATION:  GENERAL:  59 y.o.-year-old patient lying in the bed with no acute distress.  EYES: Pupils equal, round, reactive to light and accommodation. No scleral icterus. Extraocular muscles intact.  HEENT: Head atraumatic, normocephalic. Oropharynx and nasopharynx clear.  NECK:  Supple, no jugular venous distention. No thyroid enlargement, no tenderness.  LUNGS: Normal breath sounds bilaterally, no wheezing, rales,rhonchi or crepitation. No use of accessory muscles of respiration.  CARDIOVASCULAR: S1, S2 normal. No murmurs, rubs, or gallops.  ABDOMEN: Soft, nontender, nondistended. Bowel sounds present. No organomegaly or mass.  EXTREMITIES: No pedal edema, cyanosis, or clubbing.  NEUROLOGIC: Cranial nerves II through XII are intact. Muscle strength 5/5 in all extremities. Sensation intact. Gait not checked. She has tardive dyskinesia  PSYCHIATRIC: The patient is alert and oriented x 3.  SKIN: No obvious rash, lesion, or ulcer.   LABORATORY PANEL:   CBC  Recent Labs Lab 02/03/15 1434  WBC 8.8  HGB 11.3*  HCT 34.3*  PLT 215   ------------------------------------------------------------------------------------------------------------------  Chemistries   Recent Labs Lab 02/03/15 1434  NA 140  K 3.8  CL 109  CO2 24  GLUCOSE 79  BUN 11  CREATININE 0.89  CALCIUM 9.8   ------------------------------------------------------------------------------------------------------------------  Cardiac Enzymes  Recent Labs Lab 02/03/15 1434  TROPONINI <0.03   ------------------------------------------------------------------------------------------------------------------  RADIOLOGY:  Dg Chest 2 View  02/03/2015   IMPRESSION: Rounded density overlying the left lung base. It measures approximately 8 mm. It is not well appreciated on the lateral projection and may represent  a nipple shadow or calcified granuloma. If the patient is at high risk for bronchogenic carcinoma, follow-up chest CT at 3-8months is recommended. If the patient is at low risk for bronchogenic carcinoma, follow-up chest CT at 6-12 months is recommended. This recommendation follows the consensus statement: Guidelines for Management of Small Pulmonary Nodules Detected on CT Scans: A Statement from the [employer] as published in Radiology 2005; 237:395-400.   Electronically Signed   By: Inez Catalina M.D.   On: 02/03/2015 16:43   Dg Chest Portable 1 View  02/03/2015     IMPRESSION: Low lung volumes. Opacity at the LEFT costophrenic angle which may represent atelectasis or airspace disease. Consider PA and lateral with full inspiration when patient condition permits.   Electronically Signed   By: Dereck Ligas M.D.   On: 02/03/2015 15:12    EKG:  Sinus rhythm with short PR no ST elevation or depression. She has Q waves in the inferior leads  IMPRESSION AND PLAN:  56-year-old female with known CAD, essential hypertension and hyperlipidemia who presents with unstable angina.  1. Unstable angina: Patient has known CAD with a cardiac catheterization in May 2015 which shows 100% lesion and collaterals. She did not have a stent placed at that time. Patient will be admitted to Telemetry for further monitoring. I will check serial cardiac enzymes and lipid panel.  Patient will continue on Full Medical Therapy including Plavix, BB blocker, Statin and  PRN sublingual NTG. If cardiac enzymes times three are negative then sheshould undergo cardiac stress test in am. I have also placed a cardiology consult.     2. Essential hypertension: Controlled. Patient will continue on propanolol and lisinopril.  3. Bipolar affective disorder:  Stable. Patient will continue on Celexa, amitriptyline, lithium and Seroquel.  4. Hyperlipidemia: Patient will continue Zocor. I will check fasting lipids in a.m.  5.  Hypothyroidism: Patient is on Synthroid which I will continue.  6. Tobacco dependence: Patient was counseled for 4 minutes regarding stopping smoking. She says she has stopped in the past and would want to stop. I will place a nicotine patch.  7. Abnormal chest x-ray: Patient will need a follow-up CT scan in 3 months due to the abnormal chest x-ray showing a 8 mm lung nodule. She is at high risk due to her smoking history. At discharge she should have follow-up appointment with pulmonary.  All the records are reviewed and case discussed with ED provider. Management plans discussed with the patient and she is in agreement.  CODE STATUS: FULL  TOTAL TIME TAKING CARE OF THIS PATIENT: 50 minutes.    Cecil Bixby M.D on 02/03/2015 at 5:18 PM  Between 7am to 6pm - Pager - [phone] After 6pm go to [email or web address] - password [email or web address] [facility]  Office  [phone]  CC: Primary care physician; Christie Nottingham., MD

## 2015-02-03 NOTE — Progress Notes (Signed)
ANTICOAGULATION CONSULT NOTE - Initial Consult  Pharmacy Consult for Lovenox Indication: chest pain/ACS  Allergies  Allergen Reactions  . Asa [Aspirin] Hives    Patient Measurements: Height: 5' (152.4 cm) Weight: 155 lb (70.308 kg) IBW/kg (Calculated) : 45.5 Heparin Dosing Weight:   Vital Signs: Temp: 98.9 F (37.2 C) (06/20 1438) BP: 117/64 mmHg (06/20 1700) Pulse Rate: 61 (06/20 1700)  Labs:  Recent Labs  02/03/15 1434  HGB 11.3*  HCT 34.3*  PLT 215  CREATININE 0.89  TROPONINI <0.03    Estimated Creatinine Clearance: 58.8 mL/min (by C-G formula based on Cr of 0.89).   Medical History: Past Medical History  Diagnosis Date  . Coronary artery disease   . Hypertension   . Bipolar affective disorder   . Hyperlipidemia   . Hypothyroid     Medications:   (Not in a hospital admission)  Assessment: ACS CrCl = 58.8 ml/min  Goal of Therapy:   Monitor platelets by anticoagulation protocol: Yes   Plan:  Will begin Lovenox 70 mg SQ Q12H.  Griffyn Kucinski D 02/03/2015,6:03 PM

## 2015-02-03 NOTE — ED Notes (Signed)
Pt brought in via EMS for chest pain.  18g peripheral IV to left wrist in place.  Pt verbalized that she has heaviness in chest that started 12 pm today and feels like her last MI in May 2015.  Pt denies nausea and vomiting.

## 2015-02-04 ENCOUNTER — Inpatient Hospital Stay: Payer: Medicare Other

## 2015-02-04 DIAGNOSIS — I25119 Atherosclerotic heart disease of native coronary artery with unspecified angina pectoris: Secondary | ICD-10-CM | POA: Diagnosis not present

## 2015-02-04 DIAGNOSIS — I10 Essential (primary) hypertension: Secondary | ICD-10-CM | POA: Diagnosis not present

## 2015-02-04 DIAGNOSIS — F319 Bipolar disorder, unspecified: Secondary | ICD-10-CM | POA: Diagnosis not present

## 2015-02-04 DIAGNOSIS — M940 Chondrocostal junction syndrome [Tietze]: Secondary | ICD-10-CM | POA: Diagnosis not present

## 2015-02-04 DIAGNOSIS — I2510 Atherosclerotic heart disease of native coronary artery without angina pectoris: Secondary | ICD-10-CM | POA: Diagnosis not present

## 2015-02-04 DIAGNOSIS — I209 Angina pectoris, unspecified: Secondary | ICD-10-CM | POA: Diagnosis not present

## 2015-02-04 LAB — NM MYOCAR MULTI W/SPECT W/WALL MOTION / EF
CSEPED: 1 min
CSEPEDS: 0 s
CSEPPHR: 77 {beats}/min
Estimated workload: 1 METS
LV dias vol: 63 mL
LV sys vol: 12 mL
Rest HR: 62 {beats}/min
SDS: 0
SRS: 0
SSS: 0
TID: 1.22

## 2015-02-04 LAB — LIPID PANEL
CHOL/HDL RATIO: 5 ratio
Cholesterol: 223 mg/dL — ABNORMAL HIGH (ref 0–200)
HDL: 45 mg/dL (ref >40)
LDL CALC: 128 mg/dL — AB (ref 0–99)
TRIGLYCERIDES: 248 mg/dL — AB (ref <150)
VLDL: 50 mg/dL — ABNORMAL HIGH (ref 0–40)

## 2015-02-04 MED ORDER — REGADENOSON 0.4 MG/5ML IV SOLN
0.4000 mg | Freq: Once | INTRAVENOUS | Status: AC
  Administered 2015-02-04: 0.4 mg via INTRAVENOUS

## 2015-02-04 MED ORDER — TECHNETIUM TC 99M SESTAMIBI - CARDIOLITE
12.7800 | Freq: Once | INTRAVENOUS | Status: AC | PRN
  Administered 2015-02-04: 12.78 via INTRAVENOUS

## 2015-02-04 MED ORDER — SIMVASTATIN 40 MG PO TABS: 40.0000 mg | ORAL_TABLET | Freq: Every day | ORAL | Status: DC

## 2015-02-04 MED ORDER — TECHNETIUM TC 99M SESTAMIBI - CARDIOLITE
33.3000 | Freq: Once | INTRAVENOUS | Status: AC | PRN
  Administered 2015-02-04: 10:00:00 33.3 via INTRAVENOUS

## 2015-02-04 MED ORDER — TRAMADOL HCL 50 MG PO TABS
50.0000 mg | ORAL_TABLET | ORAL | Status: AC
  Administered 2015-02-04: 50 mg via ORAL
  Filled 2015-02-04: qty 1

## 2015-02-04 MED ORDER — TRAMADOL HCL 50 MG PO TABS
50.0000 mg | ORAL_TABLET | Freq: Four times a day (QID) | ORAL | Status: DC | PRN
Start: 2015-02-04 — End: 2016-06-17

## 2015-02-04 NOTE — Discharge Instructions (Signed)

## 2015-02-04 NOTE — Care Management (Signed)
Patient admitted with chest pain.  Troponins are negative and cardiology consult and stress pending.  Patient with mi one year ago and has 100% blockage of a vessel per cath at that time but was managed medically.    Patient presents from home and independent in all adls.   Has health insurance.  Denies issues accessing medical care, obtaining medications, maintaining housing, utilities and food.

## 2015-02-04 NOTE — Care Management (Signed)
Secondary review by Dr. Doy Hutching determined observation.  Medicare observation letter reviewed and signed by patient.  Obs letter and code 44 sheet sent to medical records

## 2015-02-04 NOTE — Progress Notes (Signed)
Pt has not reported any chest pain. A & O. Stress test was neg. Takes meds ok.; Iv and tele removed. Prescriptions given to pt. Discharge instructions given to pt. Pt has no further concerns at this time.

## 2015-02-04 NOTE — Consult Note (Signed)
Reason for Consult:Angina chest pain Referring Physician:  Dr. Benjie Karvonen  hospitalist Cardiologist Dr. Penny Pia Carol [name] is an 59 y.o. female.  HPI:  59 y/o white female known coronary disease history of cardiac catheterization in May, 2015 which showed 100% lesion with collaterals. Patient has history of hypertension bipolar disorder she has been having angina and started having worsening symptoms so she finally came to the emergency room. Patient states nitroglycerin seemed to improve her symptoms she complained of chest pressure similar to what her angina was in the past patient denies blackout spells syncope no palpitations or tachycardia.  Past Medical History  Diagnosis Date  . Coronary artery disease   . Hypertension   . Bipolar affective disorder   . Hyperlipidemia   . Hypothyroid     History reviewed. No pertinent past surgical history.  History reviewed. No pertinent family history.  Social History:  reports that she has been smoking.  She has never used smokeless tobacco. She reports that she does not drink alcohol or use illicit drugs.  Allergies:  Allergies  Allergen Reactions  . Asa [Aspirin] Hives    Medications:  Prior to Admission:  No prescriptions prior to admission    Results for orders placed or performed during the hospital encounter of 02/03/15 (from the past 48 hour(s))  CBC     Status: Abnormal   Collection Time: 02/03/15  2:34 PM  Result Value Ref Range   WBC 8.8 3.6 - 11.0 K/uL   RBC 3.58 (L) 3.80 - 5.20 MIL/uL   Hemoglobin 11.3 (L) 12.0 - 16.0 g/dL   HCT 34.3 (L) 35.0 - 47.0 %   MCV 96.0 80.0 - 100.0 fL   MCH 31.7 26.0 - 34.0 pg   MCHC 33.0 32.0 - 36.0 g/dL   RDW 14.8 (H) 11.5 - 14.5 %   Platelets 215 150 - 440 K/uL  Basic metabolic panel     Status: None   Collection Time: 02/03/15  2:34 PM  Result Value Ref Range   Sodium 140 135 - 145 mmol/L   Potassium 3.8 3.5 - 5.1 mmol/L   Chloride 109 101 - 111 mmol/L   CO2 24 22 - 32 mmol/L    Glucose, Bld 79 65 - 99 mg/dL   BUN 11 6 - 20 mg/dL   Creatinine, Ser 0.89 0.44 - 1.00 mg/dL   Calcium 9.8 8.9 - 10.3 mg/dL   GFR calc non Af Amer >60 >60 mL/min   GFR calc Af Amer >60 >60 mL/min    Comment: (NOTE) The eGFR has been calculated using the CKD EPI equation. This calculation has not been validated in all clinical situations. eGFR's persistently <60 mL/min signify possible Chronic Kidney Disease.    Anion gap 7 5 - 15  Troponin I     Status: None   Collection Time: 02/03/15  2:34 PM  Result Value Ref Range   Troponin I <0.03 <0.031 ng/mL    Comment:        NO INDICATION OF MYOCARDIAL INJURY.   Troponin I     Status: None   Collection Time: 02/03/15  5:39 PM  Result Value Ref Range   Troponin I <0.03 <0.031 ng/mL    Comment:        NO INDICATION OF MYOCARDIAL INJURY.   CK     Status: Abnormal   Collection Time: 02/03/15 10:26 PM  Result Value Ref Range   Total CK 34 (L) 38 - 234 U/L  Lipid panel  Status: Abnormal   Collection Time: 02/04/15  3:58 AM  Result Value Ref Range   Cholesterol 223 (H) 0 - 200 mg/dL   Triglycerides 248 (H) <150 mg/dL   HDL 45 >40 mg/dL   Total CHOL/HDL Ratio 5.0 RATIO   VLDL 50 (H) 0 - 40 mg/dL   LDL Cholesterol 128 (H) 0 - 99 mg/dL    Comment:        Total Cholesterol/HDL:CHD Risk Coronary Heart Disease Risk Table                     Men   Women  1/2 Average Risk   3.4   3.3  Average Risk       5.0   4.4  2 X Average Risk   9.6   7.1  3 X Average Risk  23.4   11.0        Use the calculated Patient Ratio above and the CHD Risk Table to determine the patient's CHD Risk.        ATP III CLASSIFICATION (LDL):  <100     mg/dL   Optimal  100-129  mg/dL   Near or Above                    Optimal  130-159  mg/dL   Borderline  160-189  mg/dL   High  >190     mg/dL   Very High     Dg Chest 2 View  02/03/2015   CLINICAL DATA:  Chest pain for 1 day  EXAM: CHEST - 2 VIEW  COMPARISON:  Film from earlier in the same day   FINDINGS: Cardiac shadow is within normal limits. The lungs are well aerated bilaterally. Extrinsic leads are noted over the upper chest bilaterally. Rounded density is noted in the left lung base which is not well appreciated on the lateral projection. This may represent a nipple shadow or calcified granuloma. No other focal abnormality is noted.  IMPRESSION: Rounded density overlying the left lung base. It measures approximately 8 mm. It is not well appreciated on the lateral projection and may represent a nipple shadow or calcified granuloma. If the patient is at high risk for bronchogenic carcinoma, follow-up chest CT at 3-31month is recommended. If the patient is at low risk for bronchogenic carcinoma, follow-up chest CT at 6-12 months is recommended. This recommendation follows the consensus statement: Guidelines for Management of Small Pulmonary Nodules Detected on CT Scans: A Statement from the [employer] published in Radiology 2005; 237:395-400.   Electronically Signed   By: MInez CatalinaM.D.   On: 02/03/2015 16:43   Nm Myocar Multi W/spect W/wall Motion / Ef  02/04/2015    The study is normal.  This is a low risk study.  The left ventricular ejection fraction is hyperdynamic (>65%).  There was no ST segment deviation noted during stress.    Dg Chest Portable 1 View  02/03/2015   CLINICAL DATA:  Chest pain. Onset of chest pain today. Worsening shortness of breath. LEFT side and arm pain.  EXAM: PORTABLE CHEST - 1 VIEW  COMPARISON:  05/27/2014.  07/04/2010.  FINDINGS: Cardiopericardial silhouette within normal limits. RIGHT lung clear. Monitoring leads project over the chest. Lung volumes are lower than on prior exams. There is density at the LEFT costophrenic angle which could represent a small focus of airspace disease or atelectasis based on the low volumes.  IMPRESSION: Low lung volumes. Opacity at the LEFT  costophrenic angle which may represent atelectasis or airspace disease.  Consider PA and lateral with full inspiration when patient condition permits.   Electronically Signed   By: Dereck Ligas M.D.   On: 02/03/2015 15:12    Review of Systems  Constitutional: Negative.   HENT: Negative.   Eyes: Negative.   Respiratory: Negative.   Cardiovascular: Positive for chest pain.  Gastrointestinal: Negative.   Genitourinary: Negative.   Musculoskeletal: Negative.   Skin: Negative.   Neurological: Positive for speech change.  Psychiatric/Behavioral: Negative.    Blood pressure 120/58, pulse 62, temperature 98.2 F (36.8 C), temperature source Oral, resp. rate 17, height 5' (1.524 m), weight 70.67 kg (155 lb 12.8 oz), SpO2 95 %. Physical Exam  Constitutional: She is oriented to person, place, and time. She appears well-developed and well-nourished.  HENT:  Head: Normocephalic.  Eyes: Pupils are equal, round, and reactive to light.  Neck: Normal range of motion. Neck supple.  Cardiovascular: Normal rate and normal heart sounds.   Respiratory: Effort normal and breath sounds normal.  GI: Soft. Bowel sounds are normal.  Musculoskeletal: Normal range of motion.  Neurological: She is alert and oriented to person, place, and time. She has normal reflexes.  Skin: Skin is warm and dry.  Psychiatric: She has a normal mood and affect.    Assessment/Plan:  unstable angina  coronary artery disease  hypertension  hyperlipidemia  bipolar affective disorder  hypothyroidism . PLAN  rule out for myocardial infarction  follow-up cardiac enzymes  follow-up EKGs  continue telemetry  recommend functional study  continue hypertension control  continue aspirin and Plavix therapy  agree with simvastatin for lipid management  levothyroxine therapy for hypothyroidism  outpatient follow-up with Cardiology as an outpatient of [facility] was okay   Ac Colan D. 02/04/2015, 3:50 PM

## 2015-02-04 NOTE — Discharge Summary (Signed)
Carol [name] at [place] NAME: Carol [name]    MR#:  [id number]  DATE OF BIRTH:  Apr 18, 1956  DATE OF ADMISSION:  02/03/2015 ADMITTING PHYSICIAN: Bettey Costa, MD  DATE OF DISCHARGE: 02/04/15  PRIMARY CARE PHYSICIAN: Christie Nottingham., MD    ADMISSION DIAGNOSIS:  Unstable angina [I20.0] Chest pain [R07.9]  DISCHARGE DIAGNOSIS:  Active Problems:   Unstable angina   SECONDARY DIAGNOSIS:   Past Medical History  Diagnosis Date  . Coronary artery disease   . Hypertension   . Bipolar affective disorder   . Hyperlipidemia   . Hypothyroid     HOSPITAL COURSE:   [place] female with known history of coronary artery disease status post RCA occlusion in 2015 on medical management, bipolar disorder presents to the hospital secondary to chest pain  #1 chest pain-likely costochondritis. -Troponins are negative, Myoview is negative. -Tender to touch in the costochondral junction. We'll start on some tramadol as needed. Continue her home cardiac medications. Outpatient follow-up with cardiology.  #2 coronary artery disease-status post cardiac catheterization in May 2015 showing 100% RCA occlusion with the lesions in the left anterior descending. Medical management recommended at the time. -Patient allergic to aspirin so is on Plavix -Last known ejection fraction of 50%. Continue other cardiac medications at this time. Myoview is negative. Follow up with Dr. Clayborn Bigness in the office in a week.  #3 bipolar disorder continue home medications patient on lithium, Celexa and Seroquel.  #4 hyperlipidemia-increase Zocor dose as elevated LDL. Discharge home today.  DISCHARGE CONDITIONS:   Stable  CONSULTS OBTAINED:  Treatment Team:  Yolonda Kida, MD  DRUG ALLERGIES:   Allergies  Allergen Reactions  . Asa [Aspirin] Hives    DISCHARGE MEDICATIONS:   Current Discharge Medication List    START taking these medications   Details   traMADol (ULTRAM) 50 MG tablet Take 1 tablet (50 mg total) by mouth every 6 (six) hours as needed. Qty: 10 tablet, Refills: 0      CONTINUE these medications which have NOT CHANGED   Details  amitriptyline (ELAVIL) 50 MG tablet Take 50 mg by mouth at bedtime.    budesonide-formoterol (SYMBICORT) 160-4.5 MCG/ACT inhaler Inhale 1 puff into the lungs 2 (two) times daily.    cetirizine (ZYRTEC) 10 MG tablet Take 10 mg by mouth daily.    citalopram (CELEXA) 40 MG tablet Take 40 mg by mouth daily.    clopidogrel (PLAVIX) 75 MG tablet Take 75 mg by mouth daily.    levothyroxine (SYNTHROID, LEVOTHROID) 100 MCG tablet Take 100 mcg by mouth daily.    lisinopril (PRINIVIL,ZESTRIL) 5 MG tablet Take 5 mg by mouth daily.    lithium carbonate 300 MG capsule Take 300-600 mg by mouth 2 (two) times daily. Pt takes one capsule in the morning and two at bedtime.    lubiprostone (AMITIZA) 8 MCG capsule Take 8 mcg by mouth 2 (two) times daily as needed for constipation.    propranolol (INDERAL) 10 MG tablet Take 10 mg by mouth 3 (three) times daily.    QUEtiapine (SEROQUEL) 300 MG tablet Take 300 mg by mouth at bedtime.    simvastatin (ZOCOR) 20 MG tablet Take 20 mg by mouth at bedtime.         DISCHARGE INSTRUCTIONS:   1. PCP f/u in 2 weeks 2. Cardiology f/u in 1 week  If you experience worsening of your admission symptoms, develop shortness of breath, life threatening emergency, suicidal or homicidal  thoughts you must seek medical attention immediately by calling 911 or calling your MD immediately  if symptoms less severe.  You Must read complete instructions/literature along with all the possible adverse reactions/side effects for all the Medicines you take and that have been prescribed to you. Take any new Medicines after you have completely understood and accept all the possible adverse reactions/side effects.   Please note  You were cared for by a hospitalist during your hospital stay.  If you have any questions about your discharge medications or the care you received while you were in the hospital after you are discharged, you can call the unit and asked to speak with the hospitalist on call if the hospitalist that took care of you is not available. Once you are discharged, your primary care physician will handle any further medical issues. Please note that NO REFILLS for any discharge medications will be authorized once you are discharged, as it is imperative that you return to your primary care physician (or establish a relationship with a primary care physician if you do not have one) for your aftercare needs so that they can reassess your need for medications and monitor your lab values.    Today   CHIEF COMPLAINT:   Chief Complaint  Patient presents with  . Chest Pain    VITAL SIGNS:  Blood pressure 120/58, pulse 62, temperature 98.2 F (36.8 C), temperature source Oral, resp. rate 17, height 5' (1.524 m), weight 70.67 kg (155 lb 12.8 oz), SpO2 95 %.  I/O:   Intake/Output Summary (Last 24 hours) at 02/04/15 1403 Last data filed at 02/04/15 0610  Gross per 24 hour  Intake      0 ml  Output   1350 ml  Net  -1350 ml    PHYSICAL EXAMINATION:   Physical Exam  GENERAL:  59 y.o.-year-old patient lying in the bed with no acute distress.  EYES: Pupils equal, round, reactive to light and accommodation. No scleral icterus. Extraocular muscles intact.  HEENT: Head atraumatic, normocephalic. Oropharynx and nasopharynx clear.  NECK:  Supple, no jugular venous distention. No thyroid enlargement, no tenderness.  LUNGS: Normal breath sounds bilaterally, no wheezing, rales,rhonchi or crepitation. No use of accessory muscles of respiration.  CARDIOVASCULAR: S1, S2 normal. No murmurs, rubs, or gallops.  Mild tenderness in both costochondral junctions noted. ABDOMEN: Soft, non-tender, non-distended. Bowel sounds present. No organomegaly or mass.  EXTREMITIES: No pedal  edema, cyanosis, or clubbing.  NEUROLOGIC: Cranial nerves II through XII are intact. Muscle strength 5/5 in all extremities. Sensation intact. Gait not checked.  PSYCHIATRIC: The patient is alert and oriented x 3.  SKIN: No obvious rash, lesion, or ulcer.   DATA REVIEW:   CBC  Recent Labs Lab 02/03/15 1434  WBC 8.8  HGB 11.3*  HCT 34.3*  PLT 215    Chemistries   Recent Labs Lab 02/03/15 1434  NA 140  K 3.8  CL 109  CO2 24  GLUCOSE 79  BUN 11  CREATININE 0.89  CALCIUM 9.8    Cardiac Enzymes  Recent Labs Lab 02/03/15 1739  TROPONINI <0.03    Microbiology Results  No results found for this or any previous visit.  RADIOLOGY:  Dg Chest 2 View  02/03/2015   CLINICAL DATA:  Chest pain for 1 day  EXAM: CHEST - 2 VIEW  COMPARISON:  Film from earlier in the same day  FINDINGS: Cardiac shadow is within normal limits. The lungs are well aerated bilaterally. Extrinsic leads are  noted over the upper chest bilaterally. Rounded density is noted in the left lung base which is not well appreciated on the lateral projection. This may represent a nipple shadow or calcified granuloma. No other focal abnormality is noted.  IMPRESSION: Rounded density overlying the left lung base. It measures approximately 8 mm. It is not well appreciated on the lateral projection and may represent a nipple shadow or calcified granuloma. If the patient is at high [name] for bronchogenic carcinoma, follow-up chest CT at 3-74months is recommended. If the patient is at low [name] for bronchogenic carcinoma, follow-up chest CT at 6-12 months is recommended. This recommendation follows the consensus statement: Guidelines for Management of Small Pulmonary Nodules Detected on CT Scans: A Statement from the [place] as published in Radiology 2005; 237:395-400.   Electronically Signed   By: Inez Catalina M.D.   On: 02/03/2015 16:43   Nm Myocar Multi W/spect W/wall Motion / Ef  02/04/2015    The study is normal.   This is a low [name] study.  The left ventricular ejection fraction is hyperdynamic (>65%).  There was no ST segment deviation noted during stress.    Dg Chest Portable 1 View  02/03/2015   CLINICAL DATA:  Chest pain. Onset of chest pain today. Worsening shortness of breath. LEFT side and arm pain.  EXAM: PORTABLE CHEST - 1 VIEW  COMPARISON:  05/27/2014.  07/04/2010.  FINDINGS: Cardiopericardial silhouette within normal limits. RIGHT lung clear. Monitoring leads project over the chest. Lung volumes are lower than on prior exams. There is density at the LEFT costophrenic angle which could represent a small focus of airspace disease or atelectasis based on the low volumes.  IMPRESSION: Low lung volumes. Opacity at the LEFT costophrenic angle which may represent atelectasis or airspace disease. Consider PA and lateral with full inspiration when patient condition permits.   Electronically Signed   By: Dereck Ligas M.D.   On: 02/03/2015 15:12    EKG:   Orders placed or performed during the hospital encounter of 02/03/15  . EKG 12-Lead  . EKG 12-Lead      Management plans discussed with the patient, family and they are in agreement.  CODE STATUS:     Code Status Orders        Start     Ordered   02/03/15 1753  Full code   Continuous     02/03/15 1752      TOTAL TIME TAKING CARE OF THIS PATIENT: 38 minutes.    Gladstone Lighter M.D on 02/04/2015 at 2:03 PM  Between 7am to 6pm - Pager - [phone]  After 6pm go to [email or web address] - password [email or web address] [facility]  Office  [phone]  CC: Primary care physician; Christie Nottingham., MD

## 2015-02-14 DIAGNOSIS — I2510 Atherosclerotic heart disease of native coronary artery without angina pectoris: Secondary | ICD-10-CM | POA: Diagnosis not present

## 2015-02-14 DIAGNOSIS — I10 Essential (primary) hypertension: Secondary | ICD-10-CM | POA: Diagnosis not present

## 2015-02-14 DIAGNOSIS — E782 Mixed hyperlipidemia: Secondary | ICD-10-CM | POA: Diagnosis not present

## 2015-02-26 ENCOUNTER — Emergency Department: Payer: Medicare Other

## 2015-02-26 ENCOUNTER — Inpatient Hospital Stay: Payer: Medicare Other

## 2015-02-26 ENCOUNTER — Encounter: Payer: Self-pay | Admitting: Emergency Medicine

## 2015-02-26 ENCOUNTER — Inpatient Hospital Stay
Admit: 2015-02-26 | Discharge: 2015-02-26 | Disposition: A | Discharge status: Home / Self Care | Payer: Medicare Other | Attending: Internal Medicine | Admitting: Internal Medicine

## 2015-02-26 ENCOUNTER — Inpatient Hospital Stay
Admission: EM | Admit: 2015-02-26 | Discharge: 2015-02-28 | DRG: 093 | Disposition: A | Discharge status: Home / Self Care | Payer: Medicare Other | Attending: Internal Medicine | Admitting: Internal Medicine

## 2015-02-26 DIAGNOSIS — I6359 Cerebral infarction due to unspecified occlusion or stenosis of other cerebral artery: Secondary | ICD-10-CM | POA: Diagnosis not present

## 2015-02-26 DIAGNOSIS — I252 Old myocardial infarction: Secondary | ICD-10-CM | POA: Diagnosis not present

## 2015-02-26 DIAGNOSIS — F319 Bipolar disorder, unspecified: Secondary | ICD-10-CM

## 2015-02-26 DIAGNOSIS — R270 Ataxia, unspecified: Secondary | ICD-10-CM | POA: Diagnosis not present

## 2015-02-26 DIAGNOSIS — E785 Hyperlipidemia, unspecified: Secondary | ICD-10-CM | POA: Diagnosis present

## 2015-02-26 DIAGNOSIS — R197 Diarrhea, unspecified: Secondary | ICD-10-CM | POA: Diagnosis present

## 2015-02-26 DIAGNOSIS — R4781 Slurred speech: Secondary | ICD-10-CM | POA: Diagnosis not present

## 2015-02-26 DIAGNOSIS — I959 Hypotension, unspecified: Secondary | ICD-10-CM | POA: Diagnosis not present

## 2015-02-26 DIAGNOSIS — I2510 Atherosclerotic heart disease of native coronary artery without angina pectoris: Secondary | ICD-10-CM | POA: Diagnosis present

## 2015-02-26 DIAGNOSIS — Z882 Allergy status to sulfonamides status: Secondary | ICD-10-CM | POA: Diagnosis not present

## 2015-02-26 DIAGNOSIS — T43595A Adverse effect of other antipsychotics and neuroleptics, initial encounter: Secondary | ICD-10-CM | POA: Diagnosis present

## 2015-02-26 DIAGNOSIS — E039 Hypothyroidism, unspecified: Secondary | ICD-10-CM | POA: Diagnosis present

## 2015-02-26 DIAGNOSIS — R531 Weakness: Secondary | ICD-10-CM | POA: Diagnosis not present

## 2015-02-26 DIAGNOSIS — Y92009 Unspecified place in unspecified non-institutional (private) residence as the place of occurrence of the external cause: Secondary | ICD-10-CM | POA: Diagnosis not present

## 2015-02-26 DIAGNOSIS — Z79899 Other long term (current) drug therapy: Secondary | ICD-10-CM

## 2015-02-26 DIAGNOSIS — R110 Nausea: Secondary | ICD-10-CM | POA: Diagnosis present

## 2015-02-26 DIAGNOSIS — Z8673 Personal history of transient ischemic attack (TIA), and cerebral infarction without residual deficits: Secondary | ICD-10-CM | POA: Diagnosis not present

## 2015-02-26 DIAGNOSIS — Z886 Allergy status to analgesic agent status: Secondary | ICD-10-CM

## 2015-02-26 DIAGNOSIS — T56891A Toxic effect of other metals, accidental (unintentional), initial encounter: Secondary | ICD-10-CM | POA: Diagnosis not present

## 2015-02-26 DIAGNOSIS — R7989 Other specified abnormal findings of blood chemistry: Secondary | ICD-10-CM

## 2015-02-26 DIAGNOSIS — I10 Essential (primary) hypertension: Secondary | ICD-10-CM | POA: Diagnosis present

## 2015-02-26 DIAGNOSIS — R7889 Finding of other specified substances, not normally found in blood: Secondary | ICD-10-CM | POA: Diagnosis not present

## 2015-02-26 DIAGNOSIS — I6523 Occlusion and stenosis of bilateral carotid arteries: Secondary | ICD-10-CM | POA: Diagnosis not present

## 2015-02-26 DIAGNOSIS — I639 Cerebral infarction, unspecified: Secondary | ICD-10-CM | POA: Diagnosis present

## 2015-02-26 DIAGNOSIS — R4789 Other speech disturbances: Secondary | ICD-10-CM | POA: Diagnosis not present

## 2015-02-26 HISTORY — DX: Acute myocardial infarction, unspecified: I21.9

## 2015-02-26 LAB — CBC
HCT: 32.1 % — ABNORMAL LOW (ref 35.0–47.0)
Hemoglobin: 10.8 g/dL — ABNORMAL LOW (ref 12.0–16.0)
MCH: 32 pg (ref 26.0–34.0)
MCHC: 33.5 g/dL (ref 32.0–36.0)
MCV: 95.8 fL (ref 80.0–100.0)
Platelets: 180 10*3/uL (ref 150–440)
RBC: 3.35 MIL/uL — ABNORMAL LOW (ref 3.80–5.20)
RDW: 14.2 % (ref 11.5–14.5)
WBC: 7.2 10*3/uL (ref 3.6–11.0)

## 2015-02-26 LAB — COMPREHENSIVE METABOLIC PANEL
ALBUMIN: 4.3 g/dL (ref 3.5–5.0)
ALT: 14 U/L (ref 14–54)
AST: 18 U/L (ref 15–41)
Alkaline Phosphatase: 148 U/L — ABNORMAL HIGH (ref 38–126)
Anion gap: 3 — ABNORMAL LOW (ref 5–15)
BILIRUBIN TOTAL: 0.5 mg/dL (ref 0.3–1.2)
BUN: 16 mg/dL (ref 6–20)
CALCIUM: 9.8 mg/dL (ref 8.9–10.3)
CO2: 26 mmol/L (ref 22–32)
Chloride: 107 mmol/L (ref 101–111)
Creatinine, Ser: 1.2 mg/dL — ABNORMAL HIGH (ref 0.44–1.00)
GFR calc Af Amer: 56 mL/min — ABNORMAL LOW (ref >60)
GFR, EST NON AFRICAN AMERICAN: 48 mL/min — AB (ref >60)
Glucose, Bld: 86 mg/dL (ref 65–99)
POTASSIUM: 4.3 mmol/L (ref 3.5–5.1)
SODIUM: 136 mmol/L (ref 135–145)
Total Protein: 7.3 g/dL (ref 6.5–8.1)

## 2015-02-26 LAB — PROTIME-INR
INR: 1
PROTHROMBIN TIME: 13.4 s (ref 11.4–15.0)

## 2015-02-26 LAB — DIFFERENTIAL
BASOS PCT: 1 %
Basophils Absolute: 0 10*3/uL (ref 0–0.1)
Eosinophils Absolute: 0.2 10*3/uL (ref 0–0.7)
Eosinophils Relative: 3 %
LYMPHS PCT: 17 %
Lymphs Abs: 1.2 10*3/uL (ref 1.0–3.6)
Monocytes Absolute: 0.5 10*3/uL (ref 0.2–0.9)
Monocytes Relative: 7 %
Neutro Abs: 5.3 10*3/uL (ref 1.4–6.5)
Neutrophils Relative %: 72 %

## 2015-02-26 LAB — LITHIUM LEVEL: Lithium Lvl: 2.4 mmol/L (ref 0.60–1.20)

## 2015-02-26 LAB — ETHANOL: Alcohol, Ethyl (B): <5 mg/dL (ref <5)

## 2015-02-26 LAB — APTT: aPTT: 29 seconds (ref 24–36)

## 2015-02-26 MED ORDER — CITALOPRAM HYDROBROMIDE 20 MG PO TABS
40.0000 mg | ORAL_TABLET | Freq: Every day | ORAL | Status: DC
  Administered 2015-02-27 – 2015-02-28 (×2): 40 mg via ORAL
  Filled 2015-02-26 (×2): qty 2

## 2015-02-26 MED ORDER — SENNOSIDES-DOCUSATE SODIUM 8.6-50 MG PO TABS
1.0000 | ORAL_TABLET | Freq: Every evening | ORAL | Status: DC | PRN
Start: 2015-02-26 — End: 2015-02-28

## 2015-02-26 MED ORDER — AMITRIPTYLINE HCL 25 MG PO TABS
50.0000 mg | ORAL_TABLET | Freq: Every day | ORAL | Status: DC
  Administered 2015-02-26 – 2015-02-27 (×2): 50 mg via ORAL
  Filled 2015-02-26 (×2): qty 2

## 2015-02-26 MED ORDER — ENOXAPARIN SODIUM 40 MG/0.4ML ~~LOC~~ SOLN
40.0000 mg | SUBCUTANEOUS | Status: DC
  Administered 2015-02-26 – 2015-02-27 (×2): 40 mg via SUBCUTANEOUS
  Filled 2015-02-26 (×2): qty 0.4

## 2015-02-26 MED ORDER — BUDESONIDE-FORMOTEROL FUMARATE 160-4.5 MCG/ACT IN AERO
1.0000 | INHALATION_SPRAY | Freq: Two times a day (BID) | RESPIRATORY_TRACT | Status: DC
  Administered 2015-02-26 – 2015-02-28 (×4): 1 via RESPIRATORY_TRACT
  Filled 2015-02-26: qty 6

## 2015-02-26 MED ORDER — SIMVASTATIN 20 MG PO TABS
20.0000 mg | ORAL_TABLET | Freq: Every day | ORAL | Status: DC
  Administered 2015-02-26 – 2015-02-28 (×3): 20 mg via ORAL
  Filled 2015-02-26 (×3): qty 1

## 2015-02-26 MED ORDER — CLOPIDOGREL BISULFATE 75 MG PO TABS
75.0000 mg | ORAL_TABLET | Freq: Every day | ORAL | Status: DC
  Administered 2015-02-26 – 2015-02-28 (×3): 75 mg via ORAL
  Filled 2015-02-26 (×3): qty 1

## 2015-02-26 MED ORDER — FERROUS SULFATE 325 (65 FE) MG PO TABS
325.0000 mg | ORAL_TABLET | ORAL | Status: DC
  Administered 2015-02-27: 13:00:00 325 mg via ORAL
  Filled 2015-02-26: qty 1

## 2015-02-26 MED ORDER — STROKE: EARLY STAGES OF RECOVERY BOOK
Freq: Once | Status: AC
  Administered 2015-02-26: 17:00:00

## 2015-02-26 MED ORDER — PROPRANOLOL HCL 10 MG PO TABS
10.0000 mg | ORAL_TABLET | Freq: Three times a day (TID) | ORAL | Status: DC
  Administered 2015-02-27: 11:00:00 10 mg via ORAL
  Filled 2015-02-26 (×2): qty 1

## 2015-02-26 MED ORDER — SODIUM CHLORIDE 0.9 % IV SOLN
INTRAVENOUS | Status: DC
  Administered 2015-02-26 – 2015-02-27 (×2): via INTRAVENOUS

## 2015-02-26 MED ORDER — LEVOTHYROXINE SODIUM 100 MCG PO TABS
100.0000 ug | ORAL_TABLET | Freq: Every day | ORAL | Status: DC
  Administered 2015-02-27 – 2015-02-28 (×2): 100 ug via ORAL
  Filled 2015-02-26 (×2): qty 1

## 2015-02-26 MED ORDER — QUETIAPINE FUMARATE 200 MG PO TABS
300.0000 mg | ORAL_TABLET | Freq: Every day | ORAL | Status: DC
  Administered 2015-02-26 – 2015-02-27 (×2): 300 mg via ORAL
  Filled 2015-02-26 (×2): qty 1

## 2015-02-26 MED ORDER — LORATADINE 10 MG PO TABS
10.0000 mg | ORAL_TABLET | Freq: Every day | ORAL | Status: DC
Start: 2015-02-27 — End: 2015-02-28
  Administered 2015-02-27 – 2015-02-28 (×2): 10 mg via ORAL
  Filled 2015-02-26 (×2): qty 1

## 2015-02-26 MED ORDER — TRAMADOL HCL 50 MG PO TABS: 50.0000 mg | ORAL_TABLET | Freq: Four times a day (QID) | ORAL | Status: DC | PRN

## 2015-02-26 NOTE — H&P (Addendum)
[facility] [place] NAME: Carol [name]    MR#:  [id number]  DATE OF BIRTH:  1956/05/05  DATE OF ADMISSION:  02/26/2015  PRIMARY CARE PHYSICIAN: Christie Nottingham., PA   REQUESTING/REFERRING PHYSICIAN: Dalphine Handing M.D.  CHIEF COMPLAINT:   Chief Complaint  Patient presents with  . Weakness    HISTORY OF PRESENT ILLNESS: Carol [name]  is a 59 y.o. female with a known history of coronary artery disease, bipolar affective disorder, nicotine addiction, hypertension, hyperlipidemia, and hypothyroidism who presents to the emergency room with the difficulty with her speech as well as her thought process and right upper extremity and right lower extremity weakness. She reports that she thinks it started Monday evening but could've started yesterday. She is able to ambulate. She reports that her speech feels slurred and garbled. She also is having trouble with concentrating. Denies any numbness no visual difficulties.   PAST MEDICAL HISTORY:   Past Medical History  Diagnosis Date  . Coronary artery disease   . Hypertension   . Bipolar affective disorder   . Hyperlipidemia   . Hypothyroid     PAST SURGICAL HISTORY:  Past Surgical History  Procedure Laterality Date  . Appendectomy    . Hysterotomy      SOCIAL HISTORY:  History  Substance Use Topics  . Smoking status: Current Every Day Smoker  . Smokeless tobacco: Never Used  . Alcohol Use: No    FAMILY HISTORY:  Family History  Problem Relation Age of Onset  . CAD      DRUG ALLERGIES:  Allergies  Allergen Reactions  . Morphine Other (See Comments)  . Pregabalin Other (See Comments)  . Rofecoxib Other (See Comments)  . Sulfamethoxazole-Trimethoprim Other (See Comments)  . Asa [Aspirin] Hives, Nausea Only and Rash    Rash also    REVIEW OF SYSTEMS:   CONSTITUTIONAL: No fever, positive fatigue or positive weakness.  EYES: No blurred or double vision.  EARS, NOSE, AND THROAT:  No tinnitus or ear pain.  RESPIRATORY: No cough, shortness of breath, wheezing or hemoptysis.  CARDIOVASCULAR: No chest pain, orthopnea, edema.  GASTROINTESTINAL: No nausea, vomiting, diarrhea or abdominal pain.  GENITOURINARY: No dysuria, hematuria.  ENDOCRINE: No polyuria, nocturia,  HEMATOLOGY: No anemia, easy bruising or bleeding SKIN: No rash or lesion. MUSCULOSKELETAL: No joint pain or arthritis.   NEUROLOGIC: No tingling, numbness, positive right sided weakness. Positive slurred speech PSYCHIATRY: No anxiety or depression.   MEDICATIONS AT HOME:  Prior to Admission medications   Medication Sig Start Date End Date Taking? Authorizing Provider  amitriptyline (ELAVIL) 50 MG tablet Take 50 mg by mouth at bedtime.   Yes Historical Provider, MD  budesonide-formoterol (SYMBICORT) 160-4.5 MCG/ACT inhaler Inhale 1 puff into the lungs 2 (two) times daily.   Yes Historical Provider, MD  cetirizine (ZYRTEC) 10 MG tablet Take 10 mg by mouth daily.   Yes Historical Provider, MD  citalopram (CELEXA) 40 MG tablet Take 40 mg by mouth daily.   Yes Historical Provider, MD  clopidogrel (PLAVIX) 75 MG tablet Take 75 mg by mouth daily.   Yes Historical Provider, MD  ferrous sulfate 325 (65 FE) MG tablet Take 325 mg by mouth every 7 (seven) days.   Yes Historical Provider, MD  levothyroxine (SYNTHROID, LEVOTHROID) 100 MCG tablet Take 100 mcg by mouth daily.   Yes Historical Provider, MD  lisinopril (PRINIVIL,ZESTRIL) 5 MG tablet Take 5 mg by mouth daily.   Yes Historical  Provider, MD  lithium carbonate 300 MG capsule Take 300-600 mg by mouth 2 (two) times daily. Pt takes one capsule in the morning and two at bedtime.   Yes Historical Provider, MD  metoprolol tartrate (LOPRESSOR) 25 MG tablet Take 1 tablet by mouth daily. 02/14/15  Yes Historical Provider, MD  QUEtiapine (SEROQUEL) 300 MG tablet Take 300 mg by mouth at bedtime.   Yes Historical Provider, MD  simvastatin (ZOCOR) 20 MG tablet Take 1 tablet by  mouth daily. 12/19/14  Yes Historical Provider, MD  traMADol (ULTRAM) 50 MG tablet Take 1 tablet (50 mg total) by mouth every 6 (six) hours as needed. 02/04/15  Yes Gladstone Lighter, MD  lubiprostone (AMITIZA) 8 MCG capsule Take 8 mcg by mouth 2 (two) times daily as needed for constipation.    Historical Provider, MD  propranolol (INDERAL) 10 MG tablet Take 10 mg by mouth 3 (three) times daily.    Historical Provider, MD  simvastatin (ZOCOR) 40 MG tablet Take 1 tablet (40 mg total) by mouth at bedtime. 02/04/15   Gladstone Lighter, MD      PHYSICAL EXAMINATION:   VITAL SIGNS: Blood pressure 131/57, pulse 57, temperature 98.1 F (36.7 C), temperature source Oral, resp. rate 18, height 5' (1.524 m), weight 70.308 kg (155 lb), SpO2 99 %.  GENERAL:  59 y.o.-year-old patient lying in the bed with no acute distress.  EYES: Pupils equal, round, reactive to light and accommodation. No scleral icterus. Extraocular muscles intact.  HEENT: Head atraumatic, normocephalic. Oropharynx and nasopharynx clear.  NECK:  Supple, no jugular venous distention. No thyroid enlargement, no tenderness.  LUNGS: Normal breath sounds bilaterally, no wheezing, rales,rhonchi or crepitation. No use of accessory muscles of respiration.  CARDIOVASCULAR: S1, S2 normal. No murmurs, rubs, or gallops.  ABDOMEN: Soft, nontender, nondistended. Bowel sounds present. No organomegaly or mass.  EXTREMITIES: No pedal edema, cyanosis, or clubbing.  NEUROLOGIC: Cranial nerves II through XII are intact. Muscle strength 4/5 in right upper and right lower. Sensation intact. Gait not checked. Speech is slurred PSYCHIATRIC: The patient is alert and oriented x 3.  SKIN: No obvious rash, lesion, or ulcer.   LABORATORY PANEL:   CBC  Recent Labs Lab 02/26/15 1144  WBC 7.2  HGB 10.8*  HCT 32.1*  PLT 180  MCV 95.8  MCH 32.0  MCHC 33.5  RDW 14.2  LYMPHSABS 1.2  MONOABS 0.5  EOSABS 0.2  BASOSABS 0.0    ------------------------------------------------------------------------------------------------------------------  Chemistries   Recent Labs Lab 02/26/15 1144  NA 136  K 4.3  CL 107  CO2 26  GLUCOSE 86  BUN 16  CREATININE 1.20*  CALCIUM 9.8  AST 18  ALT 14  ALKPHOS 148*  BILITOT 0.5   ------------------------------------------------------------------------------------------------------------------ estimated creatinine clearance is 43.6 mL/min (by C-G formula based on Cr of 1.2). ------------------------------------------------------------------------------------------------------------------ No results for input(s): TSH, T4TOTAL, T3FREE, THYROIDAB in the last 72 hours.  Invalid input(s): FREET3   Coagulation profile No results for input(s): INR, PROTIME in the last 168 hours. ------------------------------------------------------------------------------------------------------------------- No results for input(s): DDIMER in the last 72 hours. -------------------------------------------------------------------------------------------------------------------  Cardiac Enzymes No results for input(s): CKMB, TROPONINI, MYOGLOBIN in the last 168 hours.  Invalid input(s): CK ------------------------------------------------------------------------------------------------------------------ Invalid input(s): POCBNP  ---------------------------------------------------------------------------------------------------------------  Urinalysis No results found for: COLORURINE, APPEARANCEUR, LABSPEC, PHURINE, GLUCOSEU, HGBUR, BILIRUBINUR, KETONESUR, PROTEINUR, UROBILINOGEN, NITRITE, LEUKOCYTESUR   RADIOLOGY: Ct Head Wo Contrast  02/26/2015   CLINICAL DATA:  Weakness started last night.  Slurred speech.  EXAM: CT HEAD WITHOUT CONTRAST  TECHNIQUE: Contiguous  axial images were obtained from the base of the skull through the vertex without contrast.  COMPARISON:  04/10/2014   FINDINGS: No evidence for acute hemorrhage, mass lesion, midline shift, hydrocephalus or large infarct. Visualized paranasal sinuses are clear. No acute bone abnormality.  IMPRESSION: No acute intracranial abnormality.   Electronically Signed   By: Markus Daft M.D.   On: 02/26/2015 11:45    EKG: Orders placed or performed during the hospital encounter of 02/03/15  . EKG 12-Lead  . EKG 12-Lead    IMPRESSION AND PLAN: Patient is a 59 year old white female with bipolar affective disorder hypertension hypothyroidism nicotine addiction presents with slurred speech and weakness  1. Slurred speech and right-sided weakness: Suspect due to acute CVA. Patient states that she is allergic to aspirin and breaks out in a severe rash therefore we'll continue her Plavix.. Will obtain MRI of the brain as well as carotid Dopplers and echocardiogram of heart. Her symptoms also could be related to her lithium level being high will hold lithium  2. Lithium toxicity: Provide IV fluids repeat lithium level in the morning psychiatry evaluation for recommendation for the dose  3. Hypotension give her IV fluids monitor blood pressure hold antihypertensives  4. Hyperlipidemia continue simvastatin fasting lipid panel in the a.m.  5. Bipolar affective disorder continue Elavil and citalopram  6. Hypothyroidism continue Synthroid  7. Miscellaneous we'll use Lovenox for DVT prophylaxis  8. Nicotine addiction: Smoking cessation provided 4 minutes spent recommended she stop smoking patient currently not interested in nicotine replacement therapy All the records are reviewed and case discussed with ED provider. Management plans discussed with the patient, family and they are in agreement.  CODE STATUS: Full    TOTAL TIME TAKING CARE OF THIS PATIENT: 55 minutes.    Dustin Flock M.D on 02/26/2015 at 1:20 PM  Between 7am to 6pm - Pager - [phone]  After 6pm go to [email or web address] - password [email or web address] [facility]  [facility]  Office  [phone]  CC: Primary care physician; Christie Nottingham., PA

## 2015-02-26 NOTE — ED Notes (Signed)
Pt oob with stand by assist to BR

## 2015-02-26 NOTE — ED Notes (Signed)
Pt with weakness starting last night and slurring speech. Pt with hx of TIA.

## 2015-02-26 NOTE — ED Provider Notes (Signed)
[facility] Emergency Department Provider Note  ____________________________________________  Time seen: Approximately 12:43 PM  I have reviewed the triage vital signs and the nursing notes.   HISTORY  Chief Complaint Weakness    HPI Carol [name] is a 59 y.o. female who states that last night she noticed that her speech seemed abnormal and her right leg was weak. She also reported feeling fatigued. She comes to the ER today because she still feels that her speech is off in her right leg is weak.  [place] fevers or chills. No overdose. She states she has been having her lithium level adjusted recently. No headache, no confusion, no nausea or vomiting. No chest pain or abdominal pain.  She does report a previous history of a "TIA". She is allergic to aspirin, but took Plavix 75 mg last night.   Past Medical History  Diagnosis Date  . Coronary artery disease   . Hypertension   . Bipolar affective disorder   . Hyperlipidemia   . Hypothyroid     Patient Active Problem List   Diagnosis Date Noted  . Unstable angina 02/03/2015    History reviewed. No pertinent past surgical history.  Current Outpatient Rx  Name  Route  Sig  Dispense  Refill  . amitriptyline (ELAVIL) 50 MG tablet   Oral   Take 50 mg by mouth at bedtime.         . budesonide-formoterol (SYMBICORT) 160-4.5 MCG/ACT inhaler   Inhalation   Inhale 1 puff into the lungs 2 (two) times daily.         . cetirizine (ZYRTEC) 10 MG tablet   Oral   Take 10 mg by mouth daily.         . citalopram (CELEXA) 40 MG tablet   Oral   Take 40 mg by mouth daily.         . clopidogrel (PLAVIX) 75 MG tablet   Oral   Take 75 mg by mouth daily.         . ferrous sulfate 325 (65 FE) MG tablet   Oral   Take 325 mg by mouth every 7 (seven) days.         Marland Kitchen levothyroxine (SYNTHROID, LEVOTHROID) 100 MCG tablet   Oral   Take 100 mcg by mouth daily.         Marland Kitchen lisinopril  (PRINIVIL,ZESTRIL) 5 MG tablet   Oral   Take 5 mg by mouth daily.         Marland Kitchen lithium carbonate 300 MG capsule   Oral   Take 300-600 mg by mouth 2 (two) times daily. Pt takes one capsule in the morning and two at bedtime.         . metoprolol tartrate (LOPRESSOR) 25 MG tablet   Oral   Take 1 tablet by mouth daily.      11   . QUEtiapine (SEROQUEL) 300 MG tablet   Oral   Take 300 mg by mouth at bedtime.         . simvastatin (ZOCOR) 20 MG tablet   Oral   Take 1 tablet by mouth daily.      0   . traMADol (ULTRAM) 50 MG tablet   Oral   Take 1 tablet (50 mg total) by mouth every 6 (six) hours as needed.   10 tablet   0   . lubiprostone (AMITIZA) 8 MCG capsule   Oral   Take 8 mcg by mouth 2 (two) times  daily as needed for constipation.         . propranolol (INDERAL) 10 MG tablet   Oral   Take 10 mg by mouth 3 (three) times daily.         . simvastatin (ZOCOR) 40 MG tablet   Oral   Take 1 tablet (40 mg total) by mouth at bedtime.   30 tablet   2     Allergies Morphine; Pregabalin; Rofecoxib; Sulfamethoxazole-trimethoprim; and Asa  No family history on file.  Social History History  Substance Use Topics  . Smoking status: Current Every Day Smoker  . Smokeless tobacco: Never Used  . Alcohol Use: No    Review of Systems Constitutional: No fever/chills Eyes: No visual changes. ENT: No sore throat. Cardiovascular: Denies chest pain. Respiratory: Denies shortness of breath. Gastrointestinal: No abdominal pain.  No nausea, no vomiting.  No diarrhea.  No constipation. Genitourinary: Negative for dysuria. Musculoskeletal: Negative for back pain. Skin: Negative for rash. Neurological: See history of present illness. 10-point ROS otherwise negative.  ____________________________________________   PHYSICAL EXAM:  VITAL SIGNS: ED Triage Vitals  Enc Vitals Group     BP 02/26/15 1102 131/57 mmHg     Pulse Rate 02/26/15 1102 57     Resp 02/26/15  1102 18     Temp 02/26/15 1102 98.1 F (36.7 C)     Temp Source 02/26/15 1102 Oral     SpO2 02/26/15 1102 99 %     Weight 02/26/15 1102 155 lb (70.308 kg)     Height 02/26/15 1102 5' (1.524 m)     Head Cir --      Peak Flow --      Pain Score --      Pain Loc --      Pain Edu? --      Excl. in [place]? --     Constitutional: Alert and oriented. Well appearing and in no acute distress. Eyes: Conjunctivae are normal. PERRL. EOMI. Head: Atraumatic. Nose: No congestion/rhinnorhea. Mouth/Throat: Mucous membranes are moist.  Oropharynx non-erythematous. Neck: No stridor.   Cardiovascular: Normal rate, regular rhythm. Grossly normal heart sounds.  Good peripheral circulation. Respiratory: Normal respiratory effort.  No retractions. Lungs CTAB. Gastrointestinal: Soft and nontender. No distention. No abdominal bruits. No CVA tenderness. Musculoskeletal: No lower extremity tenderness nor edema.  No joint effusions. Neurologic:  The patient has normal-appearing cranial nerves, but does have thick speech. She does have some mild akathisia-like movements, which the patient states are chronic due to medications for her bipolar. She does exhibit approximately 3 out of 5 strength in the right lower extremity with 5 out of 5 strength in remaining extremities with normal sensation throughout.  Skin:  Skin is warm, dry and intact. No rash noted. Psychiatric: Mood and affect are normal. Speech and behavior are normal.  ____________________________________________   LABS (all labs ordered are listed, but only abnormal results are displayed)  Labs Reviewed  CBC - Abnormal; Notable for the following:    RBC 3.35 (*)    Hemoglobin 10.8 (*)    HCT 32.1 (*)    All other components within normal limits  COMPREHENSIVE METABOLIC PANEL - Abnormal; Notable for the following:    Creatinine, Ser 1.20 (*)    Alkaline Phosphatase 148 (*)    GFR calc non Af Amer 48 (*)    GFR calc Af Amer 56 (*)    Anion gap 3  (*)    All other components within normal limits  LITHIUM  LEVEL - Abnormal; Notable for the following:    Lithium Lvl 2.40 (*)    All other components within normal limits  ETHANOL  DIFFERENTIAL  PROTIME-INR  APTT   ____________________________________________  EKG  ED ECG REPORT I, Anabelle Bungert, the attending physician, personally viewed and interpreted this ECG.  Date: 02/26/2015 EKG Time: 1 PM Rate: 50 Rhythm: Sinus bradycardia  QRS Axis: normal Intervals: normal ST/T Wave abnormalities: normal Conduction Disutrbances: Prolonged QT Narrative Interpretation: bradycardia, slightly prolonged QT  ____________________________________________  RADIOLOGY  CT Head Wo Contrast (Final result) Result time: 02/26/15 11:45:49   Final result by Rad Results In Interface (02/26/15 11:45:49)   Narrative:   CLINICAL DATA: Weakness started last night. Slurred speech.  EXAM: CT HEAD WITHOUT CONTRAST  TECHNIQUE: Contiguous axial images were obtained from the base of the skull through the vertex without contrast.  COMPARISON: 04/10/2014  FINDINGS: No evidence for acute hemorrhage, mass lesion, midline shift, hydrocephalus or large infarct. Visualized paranasal sinuses are clear. No acute bone abnormality.  IMPRESSION: No acute intracranial abnormality.    ____________________________________________   PROCEDURES  Procedure(s) performed: None  Critical Care performed: No  ____________________________________________   INITIAL IMPRESSION / ASSESSMENT AND PLAN / ED COURSE  Pertinent labs & imaging results that were available during my care of the patient were reviewed by me and considered in my medical decision making (see chart for details).  Patient presents with right-sided leg weakness and thick speech since last night. Her CT scan is normal and she does have a slightly elevated lithium level which we will hold. Based on her symptomatology, I will admit the  patient for rule out of possible stroke as etiology and also neurologic consultation and hospitalist evaluation for further etiology of weakness. ____________________________________________   FINAL CLINICAL IMPRESSION(S) / ED DIAGNOSES  Final diagnoses:  Ischemic stroke  Elevated lithium level      Delman Kitten, MD 02/26/15 1316

## 2015-02-26 NOTE — Progress Notes (Signed)
Report given to Dru, Therapist, sports. Patient is alert and oriented x4. No complaints of pain. Husband at bedside. Madlyn Frankel, RN

## 2015-02-27 DIAGNOSIS — T56891A Toxic effect of other metals, accidental (unintentional), initial encounter: Secondary | ICD-10-CM

## 2015-02-27 DIAGNOSIS — F319 Bipolar disorder, unspecified: Secondary | ICD-10-CM

## 2015-02-27 LAB — LITHIUM LEVEL: LITHIUM LVL: 1.71 mmol/L — AB (ref 0.60–1.20)

## 2015-02-27 LAB — HEMOGLOBIN A1C: HEMOGLOBIN A1C: 5.2 % (ref 4.0–6.0)

## 2015-02-27 LAB — LIPID PANEL
Cholesterol: 205 mg/dL — ABNORMAL HIGH (ref 0–200)
HDL: 35 mg/dL — AB (ref >40)
LDL CALC: 127 mg/dL — AB (ref 0–99)
TRIGLYCERIDES: 216 mg/dL — AB (ref <150)
Total CHOL/HDL Ratio: 5.9 RATIO
VLDL: 43 mg/dL — AB (ref 0–40)

## 2015-02-27 NOTE — Care Management (Signed)
Admitted to [name] with the diagnosis of CVA. Lives with husband Dwayne [phone]). Sees Conservation officer, historic buildings . Takes care of all activities of daily living herself. Uses no aids for ambulation.  No home health in the past.  MRI revealed old infarct. Motor skills are returning gradually. Carol [name] does take Lithium. Lithium  level was elevated on presentation to the hospital.  Physical therapy evaluation completed. Recommends outpatient therapy and a rolling walker. Shelbie Ammons RN MSN Care Management [phone]

## 2015-02-27 NOTE — Plan of Care (Signed)
Problem: Discharge/Transitional Outcomes Goal: Other Discharge Outcomes/Goals Outcome: Completed/Met Date Met:  02/27/15 Stroke ruled out. Lithium toxicity is main problem. Level decreased from 2.4 to 1.71. Swallow screen performed today. Patient with less slurred speech. PT worked with patient and walked with walker performing well. Requires assistance bedside her. Taking meds and swallowing well. IV maintenance fluids completed at 2pm and site flushed.

## 2015-02-27 NOTE — Progress Notes (Signed)
Patient ID: Carol [name], female   DOB: 1956/02/26, 59 y.o.   MRN: [id number] [facility] PROGRESS NOTE  PCP: Christie Nottingham., PA  HPI/Subjective: Patient came into the hospital because of weakness in her right leg gave out on her. She is having trouble with her thoughts and getting her words out. And she is having nausea. Her lithium level was found to be in the toxic range at 2.4 on admission and MRI of the brain did not show an acute stroke. Patient is feeling a little bit better today but still having some difficulty getting her words out.  Objective: Filed Vitals:   02/27/15 0606  BP: 126/45  Pulse: 51  Temp: 97.9 F (36.6 C)  Resp: 18    Intake/Output Summary (Last 24 hours) at 02/27/15 0908 Last data filed at 02/27/15 0043  Gross per 24 hour  Intake      0 ml  Output    900 ml  Net   -900 ml   Filed Weights   02/26/15 1102 02/26/15 1646  Weight: 70.308 kg (155 lb) 69.032 kg (152 lb 3 oz)    ROS: Review of Systems  Constitutional: Negative for fever and chills.  Eyes: Negative for blurred vision.  Respiratory: Negative for cough and shortness of breath.   Cardiovascular: Negative for chest pain.  Gastrointestinal: Positive for nausea. Negative for vomiting, abdominal pain, diarrhea and constipation.  Genitourinary: Negative for dysuria.  Musculoskeletal: Negative for joint pain.  Neurological: Positive for speech change. Negative for dizziness and headaches.   Exam: Physical Exam  Constitutional: She is oriented to person, place, and time.  HENT:  Nose: No mucosal edema.  Mouth/Throat: No oropharyngeal exudate or posterior oropharyngeal edema.  Eyes: Conjunctivae, EOM and lids are normal. Pupils are equal, round, and reactive to light.  Neck: No JVD present. Carotid bruit is not present. No edema present. No thyroid mass and no thyromegaly present.  Cardiovascular: S1 normal and S2 normal.  Exam reveals no gallop.   Murmur heard.  Systolic murmur  is present with a grade of 2/6  Pulses:      Dorsalis pedis pulses are 2+ on the right side, and 2+ on the left side.  Respiratory: No respiratory distress. She has no wheezes. She has no rhonchi. She has no rales.  After patient coughs the lungs were clear.  GI: Soft. Bowel sounds are normal. There is no tenderness.  Musculoskeletal:       Right ankle: She exhibits no swelling.       Left ankle: She exhibits no swelling.  Lymphadenopathy:    She has no cervical adenopathy.  Neurological: She is alert and oriented to person, place, and time. No cranial nerve deficit.  Power 5 out of 5 upper and lower extremities bilaterally. Patient has tongue repeated movements and the patient states that this is chronic for her. When I was talking to her she has periods of when she has trouble getting her words out.  Skin: Skin is warm. No rash noted. Nails show no clubbing.  Psychiatric: She has a normal mood and affect.    Data Reviewed: Basic Metabolic Panel:  Recent Labs Lab 02/26/15 1144  NA 136  K 4.3  CL 107  CO2 26  GLUCOSE 86  BUN 16  CREATININE 1.20*  CALCIUM 9.8   Liver Function Tests:  Recent Labs Lab 02/26/15 1144  AST 18  ALT 14  ALKPHOS 148*  BILITOT 0.5  PROT 7.3  ALBUMIN  4.3   CBC:  Recent Labs Lab 02/26/15 1144  WBC 7.2  NEUTROABS 5.3  HGB 10.8*  HCT 32.1*  MCV 95.8  PLT 180    Studies: Ct Head Wo Contrast  02/26/2015   CLINICAL DATA:  Weakness started last night.  Slurred speech.  EXAM: CT HEAD WITHOUT CONTRAST  TECHNIQUE: Contiguous axial images were obtained from the base of the skull through the vertex without contrast.  COMPARISON:  04/10/2014  FINDINGS: No evidence for acute hemorrhage, mass lesion, midline shift, hydrocephalus or large infarct. Visualized paranasal sinuses are clear. No acute bone abnormality.  IMPRESSION: No acute intracranial abnormality.   Electronically Signed   By: Markus Daft M.D.   On: 02/26/2015 11:45   Mr Brain Wo  Contrast  02/26/2015   CLINICAL DATA:  Speech disturbance and right leg weakness beginning last night.  EXAM: MRI HEAD WITHOUT CONTRAST  TECHNIQUE: Multiplanar, multiecho pulse sequences of the brain and surrounding structures were obtained without intravenous contrast.  COMPARISON:  Head CT 02/26/2015  FINDINGS: Diffusion imaging does not show any acute or subacute infarction. The brainstem is normal. There is an old small vessel cerebellar infarction on the left. Cerebral hemispheres show an old lacunar infarction in the anterior caudate on the left and minimal small vessel change of the white matter. No cortical or large vessel territory insult. No mass lesion, hemorrhage, hydrocephalus or extra-axial collection. No pituitary mass. There is mild mucosal thickening of the paranasal sinuses.  IMPRESSION: No acute finding. Old small vessel infarction left cerebellum and left caudate. Mild small vessel change of the cerebral hemispheric white matter.   Electronically Signed   By: Nelson Chimes M.D.   On: 02/26/2015 18:28   US Carotid Bilateral  02/26/2015   CLINICAL DATA:  59 year old female with slurred speech  EXAM: BILATERAL CAROTID DUPLEX ULTRASOUND  TECHNIQUE: Pearline Cables scale imaging, color Doppler and duplex ultrasound were performed of bilateral carotid and vertebral arteries in the neck.  COMPARISON:  Brain MRI 02/26/2015; prior carotid duplex ultrasound 10/05/2007  FINDINGS: Criteria: Quantification of carotid stenosis is based on velocity parameters that correlate the residual internal carotid diameter with NASCET-based stenosis levels, using the diameter of the distal internal carotid lumen as the denominator for stenosis measurement.  The following velocity measurements were obtained:  RIGHT  ICA:  88/31 cm/sec  CCA:  78/93 cm/sec  SYSTOLIC ICA/CCA RATIO:  1.3  DIASTOLIC ICA/CCA RATIO:  1.7  ECA:  120 cm/sec  LEFT  ICA:  103/19 cm/sec  CCA:  81/01 cm/sec  SYSTOLIC ICA/CCA RATIO:  1.2  DIASTOLIC ICA/CCA  RATIO:  1.0  ECA:  124 cm/sec  RIGHT CAROTID ARTERY: Mild heterogeneous atherosclerotic plaque in the distal common carotid artery and proximal carotid bifurcation. No evidence of internal carotid stenosis.  RIGHT VERTEBRAL ARTERY:  Patent with normal antegrade flow.  LEFT CAROTID ARTERY: Trace smooth heterogeneous atherosclerotic plaque in the carotid bifurcation extending into the proximal internal carotid artery. By peak systolic velocity criteria, the estimated stenosis remains less than 50%.  LEFT VERTEBRAL ARTERY:  Patent with normal antegrade flow.  IMPRESSION: 1. Mild heterogeneous atherosclerotic plaque in the distal right common carotid artery without evidence of internal carotid stenosis. 2. Mild (1-49%) stenosis proximal left internal carotid artery secondary to smooth but heterogeneous atherosclerotic plaque. 3. Vertebral arteries are patent with normal antegrade flow.  Signed,  Criselda Peaches, MD  [facility]  [facility]   Electronically Signed   By: Dellis Filbert.D.  On: 02/26/2015 19:48    Scheduled Meds: . amitriptyline  50 mg Oral QHS  . budesonide-formoterol  1 puff Inhalation BID  . citalopram  40 mg Oral Daily  . clopidogrel  75 mg Oral Daily  . enoxaparin (LOVENOX) injection  40 mg Subcutaneous Q24H  . ferrous sulfate  325 mg Oral Q7 days  . levothyroxine  100 mcg Oral QAC breakfast  . loratadine  10 mg Oral Daily  . propranolol  10 mg Oral TID  . QUEtiapine  300 mg Oral QHS  . simvastatin  20 mg Oral Daily   Continuous Infusions: . sodium chloride 100 mL/hr at 02/27/15 0434    Assessment/Plan:  1. Lithium toxicity with GI and neurological symptoms. The patient has nausea. Had diarrhea the other day. She had ataxia and confusion. With hydration the lithium level came down to 1.7 from 2.4. Normal range on the lithium is 0.6-1.2. Hopefully patient will be feeling better once lithium level is in the therapeutic  range. Patient does not wish to go on lithium again. Await physical therapy consultation on her gait. MRI brain negative for acute stroke. 2. Bipolar disorder- psych consult to discuss medication change. Patient does not want to be on lithium anymore 3. Hyperlipidemia unspecified continue simvastatin 4. Hypotension on presentation- better with IV fluids. 5. History of old stroke seen on MRI- continue Plavix and simvastatin. 6. Hypothyroidism unspecified continue levothyroxine.  Code Status:     Code Status Orders        Start     Ordered   02/26/15 1706  Full code   Continuous     02/26/15 1705     Disposition Plan: Hopefully home soon  Time spent: 62minutes.  Loletha Grayer  [facility]

## 2015-02-27 NOTE — Plan of Care (Signed)
Problem: Discharge/Transitional Outcomes Goal: Other Discharge Outcomes/Goals Outcome: Progressing Plan of care progress to goal: Educational plan - stroke info handout given and reviewed with pt Hemodynamically stable - pts vital stable this shift with BP running low Mobility - pt has steady gait, calls with assistance to bathroom Diet - appetite good, tolerating diet

## 2015-02-27 NOTE — Progress Notes (Signed)
Pt sinus brady on the monitor. Telemetry monitoring has expired. Notified Dr Lavetta Nielsen to see if he wanted to keep patient on the monitor.Jeffie Pollock, RN

## 2015-02-27 NOTE — Plan of Care (Signed)
Problem: Discharge Progression Outcomes Goal: Discharge plan in place and appropriate Outcome: Progressing Individualization: Patient with history of bipolar, HTN, CAD, Hypothyroidism, HLD, Fibromyalgia controlled with current meds. Lives at home with husband.

## 2015-02-27 NOTE — Evaluation (Signed)
Clinical/Bedside Swallow Evaluation Patient Details  Name: Carol [name] MRN: [id number] Date of Birth: November 08, 1955  Today's Date: 02/27/2015 Time: SLP Start Time (ACUTE ONLY): 0955 SLP Stop Time (ACUTE ONLY): 1055 SLP Time Calculation (min) (ACUTE ONLY): 60 min  Past Medical History:  Past Medical History  Diagnosis Date  . Coronary artery disease   . Hypertension   . Bipolar affective disorder   . Hyperlipidemia   . Hypothyroid   . Myocardial infarction    Past Surgical History:  Past Surgical History  Procedure Laterality Date  . Appendectomy    . Hysterotomy     HPI:  Patient came into the hospital because of weakness in her right leg gave out on her. She is having trouble with her thoughts and getting her words out. And she is having nausea. Her lithium level was found to be in the toxic range at 2.4 on admission and MRI of the brain did not show an acute stroke. Patient is feeling a little bit better today. She was conversed w/ friend at conversational level on the phone while in the room w/ no deficits noted. Noted involuntary lingual movements (baseline).   Assessment / Plan / Recommendation Clinical Impression  Pt appears at reduced risk for aspiration and appeared to safely tolerate trials of thin liquids and solids w/ no overt s/s of apsiration noted. No oral phase deficits noted w/ all trials. Noted involuntary lingual movements (baseline) but it does not appear to impact bolus control. Pt tends to drink fast and was encouraged/educated on slowly down when drinking to reduce risk for aspiration. Rec. continue w/ current diet w/ general aspiration precautions. No further skilled ST services indicated at this time as speech is clear and appropriate for pt, per pt. NSG to reconsult if any decline in status.     Aspiration Risk   (reduced)    Diet Recommendation Age appropriate regular solids;Thin   Medication Administration: Whole meds with liquid Compensations: Slow  rate;Small sips/bites    Other  Recommendations Oral Care Recommendations: Oral care BID;Oral care before and after PO;Patient independent with oral care   Follow Up Recommendations       Frequency and Duration        Pertinent Vitals/Pain denied    SLP Swallow Goals   n/a  Swallow Study Prior Functional Status  Type of Home: House    General Date of Onset: 02/26/15 Other Pertinent Information: Patient came into the hospital because of weakness in her right leg gave out on her. She is having trouble with her thoughts and getting her words out. And she is having nausea. Her lithium level was found to be in the toxic range at 2.4 on admission and MRI of the brain did not show an acute stroke. Patient is feeling a little bit better today. She was conversed w/ friend at conversational level on the phone while in the room w/ no deficits noted. Noted involuntary lingual movements (baseline). Type of Study: Bedside swallow evaluation Previous Swallow Assessment: none Diet Prior to this Study: Regular;Thin liquids Temperature Spikes Noted: No Respiratory Status: Room air History of Recent Intubation: No Behavior/Cognition: Alert;Cooperative;Pleasant mood Oral Cavity - Dentition: Edentulous (sometimes eats w/ her dentures in place) Self-Feeding Abilities: Able to feed self Patient Positioning: Upright in bed Baseline Vocal Quality: Normal Volitional Cough: Strong Volitional Swallow: Able to elicit    Oral/Motor/Sensory Function Overall Oral Motor/Sensory Function: Appears within functional limits for tasks assessed Labial ROM: Within Functional Limits Labial Symmetry: Within  Functional Limits Labial Strength: Within Functional Limits Lingual ROM: Within Functional Limits (involntary movements) Lingual Symmetry: Within Functional Limits Lingual Strength: Within Functional Limits Facial Symmetry: Within Functional Limits Mandible: Within Functional Limits   Ice Chips Ice chips: Not  tested (already drinking liquids)   Thin Liquid Thin Liquid: Within functional limits Presentation: Cup;Straw;Self Fed    Nectar Thick Nectar Thick Liquid: Not tested   Honey Thick Honey Thick Liquid: Not tested   Puree Puree: Within functional limits Presentation: Self Fed;Spoon (4 ozs)   Solid   GO    Solid: Within functional limits Presentation: Self Fed (x1 trial)       Watson,Katherine 02/27/2015,2:16 PM

## 2015-02-27 NOTE — Evaluation (Signed)
Physical Therapy Evaluation Patient Details Name: Carol [name] MRN: [id number] DOB: Apr 19, 1956 Today's Date: 02/27/2015   History of Present Illness  Carol [name] is a 59 y.o. female with a known history of coronary artery disease, bipolar affective disorder, nicotine addiction, hypertension, hyperlipidemia, and hypothyroidism who presents to the emergency room with the difficulty with her speech as well as her thought process and right upper extremity and right lower extremity weakness. She reports that she thinks it started Monday evening but could've started yesterday. She is able to ambulate. She reports that her speech feels slurred and garbled. She also is having trouble with concentrating. Denies any numbness no visual difficulties. Pt was found to have lithium toxicity. MRI was negative for acute stroke but did show evidence of chronic L cerebellar stroke  Clinical Impression  Pt demonstrates some balance deficits scoring 44/56 on there BERG. Recommended that pt use rolling walker for ambulation and follow-up with OP PT for balance training. She is somewhat ataxic with her gait and it is unclear if this is more related to lithium toxicity or baseline for patient due to old cerebellar infarct. Pt reports that she feels slightly weak during ambulation but otherwise is at her baseline. Pt is OK to return home with husband at discharge. Pt will benefit from skilled PT services to address deficits in strength, balance, and mobility in order to return to full function at home.     Follow Up Recommendations Outpatient PT (OP PT for balance)    Equipment Recommendations  Rolling walker with 5" wheels    Recommendations for Other Services       Precautions / Restrictions Precautions Precautions: None Restrictions Weight Bearing Restrictions: No      Mobility  Bed Mobility Overal bed mobility: Independent                Transfers Overall transfer level: Needs assistance Equipment  used: None Transfers: Sit to/from Stand Sit to Stand: Supervision         General transfer comment: Reasonable strength and stabiilty noted. Able to perform sit to stand without UE support.  Ambulation/Gait Ambulation/Gait assistance: Min guard Ambulation Distance (Feet): 80 Feet Assistive device: None Gait Pattern/deviations: Decreased step length - right;Decreased step length - left;Step-through pattern;Ataxic   Gait velocity interpretation: <1.8 ft/sec, indicative of risk for recurrent falls General Gait Details: Patient's gait is midly ataxic with wider stance and shorter step length. Somewhat uncoordinated and not extremely safe but no overt LOB  Stairs            Wheelchair Mobility    Modified Rankin (Stroke Patients Only)       Balance Overall balance assessment: Needs assistance Sitting-balance support: No upper extremity supported Sitting balance-Leahy Scale: Good     Standing balance support: No upper extremity supported Standing balance-Leahy Scale: Fair   Single Leg Stance - Right Leg: 1 Single Leg Stance - Left Leg: 1 Tandem Stance - Right Leg: 1 Tandem Stance - Left Leg: 1 Rhomberg - Eyes Opened: 30 Rhomberg - Eyes Closed: 3     Standardized Balance Assessment Standardized Balance Assessment : Berg Balance Test Berg Balance Test Sit to Stand: Able to stand without using hands and stabilize independently Standing Unsupported: Able to stand safely 2 minutes Sitting with Back Unsupported but Feet Supported on Floor or Stool: Able to sit safely and securely 2 minutes Stand to Sit: Sits safely with minimal use of hands Transfers: Able to transfer safely, minor use of hands Standing  Unsupported with Eyes Closed: Able to stand 10 seconds with supervision Standing Ubsupported with Feet Together: Able to place feet together independently and stand 1 minute safely From Standing, Reach Forward with Outstretched Arm: Can reach forward >12 cm safely  (5") From Standing Position, Pick up Object from Floor: Able to pick up shoe safely and easily From Standing Position, Turn to Look Behind Over each Shoulder: Looks behind from both sides and weight shifts well Turn 360 Degrees: Able to turn 360 degrees safely in 4 seconds or less Standing Unsupported, Alternately Place Feet on Step/Stool: Able to complete >2 steps/needs minimal assist Standing Unsupported, One Foot in Front: Loses balance while stepping or standing Standing on One Leg: Tries to lift leg/unable to hold 3 seconds but remains standing independently Total Score: 44         Pertinent Vitals/Pain Pain Assessment: 0-10 Pain Score: 4  Pain Location: Bilateral LEs, chronic prior to admission Pain Intervention(s): Limited activity within patient's tolerance;Monitored during session    Home Living Family/patient expects to be discharged to:: Private residence Living Arrangements: Spouse/significant other   Type of Home: House Home Access: Stairs to enter Entrance Stairs-Rails: Can reach both Entrance Stairs-Number of Steps: 5 Home Layout: One level Home Equipment: Cane - single point;Other (comment) (no walker)      Prior Function Level of Independence: Independent with assistive device(s)         Comments: prn use of single point cane     Hand Dominance        Extremity/Trunk Assessment   Upper Extremity Assessment: Overall WFL for tasks assessed (4 to 4+/5 throughout)           Lower Extremity Assessment: Overall WFL for tasks assessed (4 to 4+/5 througout. No focal weakness identified)         Communication   Communication: No difficulties  Cognition Arousal/Alertness: Awake/alert Behavior During Therapy: WFL for tasks assessed/performed Overall Cognitive Status: Within Functional Limits for tasks assessed                      General Comments      Exercises        Assessment/Plan    PT Assessment Patient needs continued PT  services  PT Diagnosis Difficulty walking;Abnormality of gait   PT Problem List Decreased strength;Decreased balance;Decreased activity tolerance;Decreased knowledge of use of DME;Decreased safety awareness  PT Treatment Interventions DME instruction;Gait training;Stair training;Functional mobility training;Therapeutic activities;Therapeutic exercise;Balance training;Neuromuscular re-education;Patient/family education   PT Goals (Current goals can be found in the Care Plan section) Acute Rehab PT Goals Patient Stated Goal: "I'll do anything that will help" PT Goal Formulation: With patient Time For Goal Achievement: 03/13/15 Potential to Achieve Goals: Fair    Frequency Min 2X/week   Barriers to discharge        Co-evaluation               End of Session Equipment Utilized During Treatment: Gait belt Activity Tolerance: Patient tolerated treatment well Patient left: in bed;with call bell/phone within reach;with bed alarm set      Functional Limitation: Mobility: Walking and moving around Mobility: Walking and Moving Around Current Status [phone]): At least 40 percent but less than 60 percent impaired, limited or restricted Mobility: Walking and Moving Around Goal Status [phone]): At least 20 percent but less than 40 percent impaired, limited or restricted    Time: 1120-1135 PT Time Calculation (min) (ACUTE ONLY): 15 min   Charges:   PT  Evaluation $Initial PT Evaluation Tier I: 1 Procedure     PT G Codes:   PT G-Codes **NOT FOR INPATIENT CLASS** Functional Limitation: Mobility: Walking and moving around Mobility: Walking and Moving Around Current Status (J8250): At least 40 percent but less than 60 percent impaired, limited or restricted Mobility: Walking and Moving Around Goal Status [phone]): At least 20 percent but less than 40 percent impaired, limited or restricted   Phillips Grout PT, DPT   Huprich,Jason 02/27/2015, 11:49 AM

## 2015-02-27 NOTE — Consult Note (Signed)
[facility] Consult   Reason for Consult:  Consult for 59 year old woman with a history of bipolar disorder who came in to the hospital with confusion and weakness Referring Physician:  Leslye Peer Patient Identification: Carol [name] MRN:  [id number] Principal Diagnosis: Lithium intoxication Diagnosis:   Patient Active Problem List   Diagnosis Date Noted  . Lithium intoxication [T56.891A] 02/27/2015  . Bipolar disorder [F31.9] 02/27/2015  . CVA (cerebral infarction) [I63.9] 02/26/2015  . Unstable angina [I20.0] 02/03/2015    Total Time spent with patient: 1 hour  Subjective:   Carol [name] is a 59 y.o. female patient admitted with patient was admitted with weakness and new onset falls and concern about a stroke. Chief complaint "my legs were giving out".  History of present.  HPI:  Patient's history obtained from the patient and the chart. She states that about a week ago she started having weakness in her legs. Her legs felt like they were giving way under her and she had several falls. She is also starting to feel dizzy and was having some nausea. She came into the hospital and there was concern about her possibly having had a stroke. From a psychiatric standpoint her mood has been stable and good. No return of severe depression. Not having any depressed mood. Generally sleeps okay. Denies suicidal or homicidal ideation. No mania and no psychosis recently. There haven't been any changes to any of her usually prescribed medicines other than a discontinuation of one of her beta blockers. She has not been abusing substances.  Past psychiatric history: Patient has a long history of mood instability diagnosed variously as depression or bipolar disorder. She used to be seen by Dr. Thurmond Butts and had multiple hospitalizations. She has not been in the psychiatric hospital for 2 years now and is seeing Dr. Jacqualine Code. She thinks that her current medications have been very stable for  her.  Social history: Patient lives with her husband and their dogs. Both are disabled. Patient has no complaints about any of her social situation.  Medical history: Past history of stroke. History of unstable angina. History of coronary artery disease. Hypothyroidism. Chronic allergies chronic pain  Family history: Negative  Substance abuse history: No recent alcohol or drug use not a significant part of past history. HPI Elements:   Quality:  Dizziness and confusion. Severity:  Moderate. Timing:  Happened over the past week or so. Duration:  Starting to clear up now. Context:  Elevated lithium level.  Past Medical History:  Past Medical History  Diagnosis Date  . Coronary artery disease   . Hypertension   . Bipolar affective disorder   . Hyperlipidemia   . Hypothyroid   . Myocardial infarction     Past Surgical History  Procedure Laterality Date  . Appendectomy    . Hysterotomy     Family History:  Family History  Problem Relation Age of Onset  . CAD     Social History:  History  Alcohol Use No     History  Drug Use No    History   Social History  . Marital Status: Married    Spouse Name: N/A  . Number of Children: N/A  . Years of Education: N/A   Social History Main Topics  . Smoking status: Current Every Day Smoker  . Smokeless tobacco: Never Used  . Alcohol Use: No  . Drug Use: No  . Sexual Activity: Yes   Other Topics Concern  . None   Social History  Narrative   Additional Social History:                          Allergies:   Allergies  Allergen Reactions  . Morphine Other (See Comments)  . Pregabalin Other (See Comments)  . Rofecoxib Other (See Comments)  . Sulfamethoxazole-Trimethoprim Other (See Comments)  . Asa [Aspirin] Hives, Nausea Only and Rash    Rash also    Labs:  Results for orders placed or performed during the hospital encounter of 02/26/15 (from the past 48 hour(s))  Ethanol     Status: None   Collection  Time: 02/26/15 11:44 AM  Result Value Ref Range   Alcohol, Ethyl (B) <5 <5 mg/dL    Comment:        LOWEST DETECTABLE LIMIT FOR SERUM ALCOHOL IS 5 mg/dL FOR MEDICAL PURPOSES ONLY   Protime-INR     Status: None   Collection Time: 02/26/15 11:44 AM  Result Value Ref Range   Prothrombin Time 13.4 11.4 - 15.0 seconds   INR 1.00   APTT     Status: None   Collection Time: 02/26/15 11:44 AM  Result Value Ref Range   aPTT 29 24 - 36 seconds  CBC     Status: Abnormal   Collection Time: 02/26/15 11:44 AM  Result Value Ref Range   WBC 7.2 3.6 - 11.0 K/uL   RBC 3.35 (L) 3.80 - 5.20 MIL/uL   Hemoglobin 10.8 (L) 12.0 - 16.0 g/dL   HCT 32.1 (L) 35.0 - 47.0 %   MCV 95.8 80.0 - 100.0 fL   MCH 32.0 26.0 - 34.0 pg   MCHC 33.5 32.0 - 36.0 g/dL   RDW 14.2 11.5 - 14.5 %   Platelets 180 150 - 440 K/uL  Differential     Status: None   Collection Time: 02/26/15 11:44 AM  Result Value Ref Range   Neutrophils Relative % 72 %   Neutro Abs 5.3 1.4 - 6.5 K/uL   Lymphocytes Relative 17 %   Lymphs Abs 1.2 1.0 - 3.6 K/uL   Monocytes Relative 7 %   Monocytes Absolute 0.5 0.2 - 0.9 K/uL   Eosinophils Relative 3 %   Eosinophils Absolute 0.2 0 - 0.7 K/uL   Basophils Relative 1 %   Basophils Absolute 0.0 0 - 0.1 K/uL  Comprehensive metabolic panel     Status: Abnormal   Collection Time: 02/26/15 11:44 AM  Result Value Ref Range   Sodium 136 135 - 145 mmol/L   Potassium 4.3 3.5 - 5.1 mmol/L   Chloride 107 101 - 111 mmol/L   CO2 26 22 - 32 mmol/L   Glucose, Bld 86 65 - 99 mg/dL   BUN 16 6 - 20 mg/dL   Creatinine, Ser 1.20 (H) 0.44 - 1.00 mg/dL   Calcium 9.8 8.9 - 10.3 mg/dL   Total Protein 7.3 6.5 - 8.1 g/dL   Albumin 4.3 3.5 - 5.0 g/dL   AST 18 15 - 41 U/L   ALT 14 14 - 54 U/L   Alkaline Phosphatase 148 (H) 38 - 126 U/L   Total Bilirubin 0.5 0.3 - 1.2 mg/dL   GFR calc non Af Amer 48 (L) >60 mL/min   GFR calc Af Amer 56 (L) >60 mL/min    Comment: (NOTE) The eGFR has been calculated using the  CKD EPI equation. This calculation has not been validated in all clinical situations. eGFR's persistently <60 mL/min signify possible Chronic  Kidney Disease.    Anion gap 3 (L) 5 - 15  Lithium level     Status: Abnormal   Collection Time: 02/26/15 11:44 AM  Result Value Ref Range   Lithium Lvl 2.40 (HH) 0.60 - 1.20 mmol/L    Comment: CRITICAL RESULT CALLED TO, READ BACK BY AND VERIFIED WITH LAUREN CLOUDEN 02/26/15 1245P BOD   Lithium level     Status: Abnormal   Collection Time: 02/27/15  5:17 AM  Result Value Ref Range   Lithium Lvl 1.71 (HH) 0.60 - 1.20 mmol/L    Comment: CRITICAL RESULT CALLED TO, READ BACK BY AND VERIFIED WITH CINDY SMITH AT 1583 ON 02/27/15.Marland KitchenMarland KitchenGilbert   Hemoglobin A1c     Status: None   Collection Time: 02/27/15  5:17 AM  Result Value Ref Range   Hgb A1c MFr Bld 5.2 4.0 - 6.0 %  Lipid panel     Status: Abnormal   Collection Time: 02/27/15  5:17 AM  Result Value Ref Range   Cholesterol 205 (H) 0 - 200 mg/dL   Triglycerides 216 (H) <150 mg/dL   HDL 35 (L) >40 mg/dL   Total CHOL/HDL Ratio 5.9 RATIO   VLDL 43 (H) 0 - 40 mg/dL   LDL Cholesterol 127 (H) 0 - 99 mg/dL    Comment:        Total Cholesterol/HDL:CHD Risk Coronary Heart Disease Risk Table                     Men   Women  1/2 Average Risk   3.4   3.3  Average Risk       5.0   4.4  2 X Average Risk   9.6   7.1  3 X Average Risk  23.4   11.0        Use the calculated Patient Ratio above and the CHD Risk Table to determine the patient's CHD Risk.        ATP III CLASSIFICATION (LDL):  <100     mg/dL   Optimal  100-129  mg/dL   Near or Above                    Optimal  130-159  mg/dL   Borderline  160-189  mg/dL   High  >190     mg/dL   Very High     Vitals: Blood pressure 131/56, pulse 56, temperature 98.9 F (37.2 C), temperature source Oral, resp. rate 18, height 5' (1.524 m), weight 69.032 kg (152 lb 3 oz), SpO2 100 %.  Risk to Self: Is patient at risk for suicide?: No Risk to Others:    Prior Inpatient Therapy:   Prior Outpatient Therapy:    Current Facility-Administered Medications  Medication Dose Route Frequency Provider Last Rate Last Dose  . amitriptyline (ELAVIL) tablet 50 mg  50 mg Oral QHS Dustin Flock, MD   50 mg at 02/26/15 2116  . budesonide-formoterol (SYMBICORT) 160-4.5 MCG/ACT inhaler 1 puff  1 puff Inhalation BID Dustin Flock, MD   1 puff at 02/27/15 0734  . citalopram (CELEXA) tablet 40 mg  40 mg Oral Daily Dustin Flock, MD   40 mg at 02/27/15 1053  . clopidogrel (PLAVIX) tablet 75 mg  75 mg Oral Daily Dustin Flock, MD   75 mg at 02/27/15 1053  . enoxaparin (LOVENOX) injection 40 mg  40 mg Subcutaneous Q24H Dustin Flock, MD   40 mg at 02/26/15 2116  . ferrous sulfate tablet 325  mg  325 mg Oral Q7 days Dustin Flock, MD   325 mg at 02/27/15 1305  . levothyroxine (SYNTHROID, LEVOTHROID) tablet 100 mcg  100 mcg Oral QAC breakfast Dustin Flock, MD   100 mcg at 02/27/15 0609  . loratadine (CLARITIN) tablet 10 mg  10 mg Oral Daily Dustin Flock, MD   10 mg at 02/27/15 1101  . QUEtiapine (SEROQUEL) tablet 300 mg  300 mg Oral QHS Dustin Flock, MD   300 mg at 02/26/15 2116  . senna-docusate (Senokot-S) tablet 1 tablet  1 tablet Oral QHS PRN Dustin Flock, MD      . simvastatin (ZOCOR) tablet 20 mg  20 mg Oral Daily Dustin Flock, MD   20 mg at 02/27/15 1100  . traMADol (ULTRAM) tablet 50 mg  50 mg Oral Q6H PRN Dustin Flock, MD        Musculoskeletal: Strength & Muscle Tone: within normal limits Gait & Station: unsteady Patient leans: N/A  Psychiatric Specialty Exam: Physical Exam  Constitutional: She appears well-developed and well-nourished.  HENT:  Head: Normocephalic and atraumatic.  Eyes: Conjunctivae are normal. Pupils are equal, round, and reactive to light.  Neck: Normal range of motion.  Cardiovascular: Normal heart sounds.   Respiratory: Effort normal.  GI: Soft.  Musculoskeletal: Normal range of motion.  Neurological: She is  alert.  Skin: Skin is warm and dry.  Psychiatric: She has a normal mood and affect. Her speech is normal and behavior is normal. Judgment and thought content normal. Cognition and memory are normal.    Review of Systems  Constitutional: Negative.   HENT: Negative.   Eyes: Negative.   Respiratory: Negative.   Cardiovascular: Negative.   Gastrointestinal: Negative.   Musculoskeletal: Positive for falls.  Skin: Negative.   Neurological: Negative.   Psychiatric/Behavioral: Negative.     Blood pressure 131/56, pulse 56, temperature 98.9 F (37.2 C), temperature source Oral, resp. rate 18, height 5' (1.524 m), weight 69.032 kg (152 lb 3 oz), SpO2 100 %.Body mass index is 29.72 kg/(m^2).  General Appearance: Fairly Groomed  Engineer, water::  Good  Speech:  Slow  Volume:  Normal  Mood:  Euthymic  Affect:  Congruent  Thought Process:  Logical  Orientation:  Full (Time, Place, and Person)  Thought Content:  Negative  Suicidal Thoughts:  No  Homicidal Thoughts:  No  Memory:  Immediate;   Good Recent;   Good Remote;   Fair  Judgement:  Intact  Insight:  Present  Psychomotor Activity:  Negative  Concentration:  Good  Recall:  Good  Fund of Knowledge:Good  Language: Good  Akathisia:  No  Handed:  Right  AIMS (if indicated):     Assets:  Communication Skills Desire for Improvement Financial Resources/Insurance Housing Intimacy Social Support  ADL's:  Intact  Cognition: WNL  Sleep:      Medical Decision Making: New problem, with additional work up planned, Review of Psycho-Social Stressors (1), Review or order clinical lab tests (1), Review and summation of old records (2) and Review of Medication Regimen & Side Effects (2)  Treatment Plan Summary: Plan Patient appears to have had lithium toxicity as the primary cause of her symptoms rather than a stroke or any other neurologic event. She came into the hospital with a lithium level over 2. It is not clear to me why she had a  elevated lithium level after being stable for many years. She denies that she's been using non-steroidal pain medicines. She denies any episodes of dehydration.  It is possible that just the heat and may be some gradual change in her kidney function could have led to an elevated lithium level. Appropriate treatment at this point is to discontinue lithium entirely and check the levels progressively. If there is concern about her kidney function we might want to have nephrology see her but otherwise I would wait until her lithium level gets down below 1 at which point I would restart lithium at 300 mg a day. Patient is educated about the plan and agrees. We'll follow-up as needed.  Plan:  No evidence of imminent risk to self or others at present.   Patient does not meet criteria for psychiatric inpatient admission. Supportive therapy provided about ongoing stressors. Discussed crisis plan, support from social network, calling 911, coming to the Emergency Department, and calling Suicide Hotline. Disposition: Continue monitoring lithium level and then restart as appropriate  Alethia Berthold 02/27/2015 7:19 PM

## 2015-02-27 NOTE — Evaluation (Signed)
Occupational Therapy Evaluation Patient Details Name: Carol [name] MRN: [id number] DOB: 01-30-1956 Today's Date: 02/27/2015    History of Present Illness Carol [name] is a 59 y.o. female with a known history of coronary artery disease, bipolar affective disorder, nicotine addiction, hypertension, hyperlipidemia, and hypothyroidism who presents to the emergency room with the difficulty with her speech as well as her thought process and right upper extremity and right lower extremity weakness. She reports that she thinks it started Monday evening but could've started yesterday. She is able to ambulate. She reports that her speech feels slurred and garbled. She also is having trouble with concentrating. Denies any numbness no visual difficulties. Pt was found to have lithium toxicity. MRI was negative for acute stroke but did show evidence of chronic L cerebellar stroke   Clinical Impression   Pt seen to assess ADLs.  She has a resting tremor in bilateral hands which she states is new but is able to complete hand writing with R hand and ADLs with use of R hand only or with L hand.  She is able to reach to her feet to complete LB dressing with rest breaks and rec that she purchase elastic shoe laces to increase safety since she is SOB with leaning forward.  Grip strength was 29# on L and 38# on R but pt is able to use hands functionally so no further OT is recommended.  Pt seen for OT evaluation only.    Follow Up Recommendations  No OT follow up    Equipment Recommendations       Recommendations for Other Services       Precautions / Restrictions Precautions Precautions: None Restrictions Weight Bearing Restrictions: No      Mobility Bed Mobility                  Transfers                      Balance                                            ADL                                         General ADL Comments: Pt is able to  complete all ADLs from seated position with extra time for SOB.  No assistance or AD needed.  Rec elastic shoe laces so pt does not have to lean forward to tie shoes.     Vision     Perception     Praxis      Pertinent Vitals/Pain Pain Assessment: 0-10 Pain Score: 2  Pain Location: bilateral LEs when geting up and walking which was chronic prior to admission Pain Descriptors / Indicators: Aching Pain Intervention(s): Limited activity within patient's tolerance;Monitored during session     Hand Dominance Right   Extremity/Trunk Assessment Upper Extremity Assessment Upper Extremity Assessment: Overall WFL for tasks assessed   Lower Extremity Assessment Lower Extremity Assessment: Defer to PT evaluation       Communication Communication Communication: No difficulties   Cognition Arousal/Alertness: Awake/alert Behavior During Therapy: WFL for tasks assessed/performed Overall Cognitive Status: Within Functional Limits for tasks assessed       Memory: Decreased short-term memory  General Comments       Exercises       Shoulder Instructions      Home Living Family/patient expects to be discharged to:: Private residence Living Arrangements: Spouse/significant other   Type of Home: House Home Access: Stairs to enter   Entrance Stairs-Rails: Can reach both Home Layout: One level     Bathroom Shower/Tub: Tub only   Biochemist, clinical: Standard Bathroom Accessibility: No   Home Equipment: Cane - single point;Other (comment)          Prior Functioning/Environment Level of Independence: Independent             OT Diagnosis:     OT Problem List:     OT Treatment/Interventions:      OT Goals(Current goals can be found in the care plan section)    OT Frequency:     Barriers to D/C:            Co-evaluation              End of Session    Activity Tolerance: Patient tolerated treatment well Patient left: in bed;with call  bell/phone within reach;with bed alarm set;with family/visitor present   Time: 1430-1500 OT Time Calculation (min): 30 min Charges:  OT General Charges $OT Visit: 1 Procedure OT Evaluation $Initial OT Evaluation Tier I: 1 Procedure OT Treatments $Self Care/Home Management : 8-22 mins G-Codes:    Wofford,Susan 03-19-2015, 4:56 PM  Chrys Racer, OTR/L

## 2015-02-28 LAB — BASIC METABOLIC PANEL
Anion gap: 7 (ref 5–15)
BUN: 12 mg/dL (ref 6–20)
CHLORIDE: 110 mmol/L (ref 101–111)
CO2: 21 mmol/L — ABNORMAL LOW (ref 22–32)
Calcium: 9.8 mg/dL (ref 8.9–10.3)
Creatinine, Ser: 0.91 mg/dL (ref 0.44–1.00)
GFR calc Af Amer: >60 mL/min (ref >60)
Glucose, Bld: 112 mg/dL — ABNORMAL HIGH (ref 65–99)
POTASSIUM: 3.5 mmol/L (ref 3.5–5.1)
Sodium: 138 mmol/L (ref 135–145)

## 2015-02-28 LAB — LITHIUM LEVEL: Lithium Lvl: 1.05 mmol/L (ref 0.60–1.20)

## 2015-02-28 NOTE — Discharge Instructions (Signed)
MD making rounds. Discharge orders received. No appointments ordered by MD to be scheduled. Telemetry discontinued. IV removed. Discharge paperwork provided, explained, signed and witnessed. No unanswered questions. Discharged via wheelchair with auxiliary staff. Belongings sent with patient and family.   DIET:  Regular diet  DISCHARGE CONDITION:  Fair  ACTIVITY:  Activity as tolerated  OXYGEN:  Home Oxygen: No.   Oxygen Delivery: room air  DISCHARGE LOCATION:  home   If you experience worsening of your admission symptoms, develop shortness of breath, life threatening emergency, suicidal or homicidal thoughts you must seek medical attention immediately by calling 911 or calling your MD immediately  if symptoms less severe.  You Must read complete instructions/literature along with all the possible adverse reactions/side effects for all the Medicines you take and that have been prescribed to you. Take any new Medicines after you have completely understood and accpet all the possible adverse reactions/side effects.   Please note  You were cared for by a hospitalist during your hospital stay. If you have any questions about your discharge medications or the care you received while you were in the hospital after you are discharged, you can call the unit and asked to speak with the hospitalist on call if the hospitalist that took care of you is not available. Once you are discharged, your primary care physician will handle any further medical issues. Please note that NO REFILLS for any discharge medications will be authorized once you are discharged, as it is imperative that you return to your primary care physician (or establish a relationship with a primary care physician if you do not have one) for your aftercare needs so that they can reassess your need for medications and monitor your lab values.

## 2015-02-28 NOTE — Progress Notes (Signed)
Patient doing well. OK for DC today, but she would like to come off of Lithium. Dr. Weber Cooks will discuss with her prior to discharge. DC summary and final dc planning pending Dr. Weber Cooks recommendations.

## 2015-02-28 NOTE — Plan of Care (Signed)
Problem: Discharge Progression Outcomes Goal: Discharge plan in place and appropriate Individualism Outcome: Progressing Patient with history of bipolar, HTN, CAD, Hypothyroidism, HLD, Fibromyalgia controlled with current meds.  Lives at home with husband.     Goal: Other Discharge Outcomes/Goals Outcome: Progressing Plan of care progress to goal: Pain - pt complains of no pain Hemodynamically stable - tends to run SB Complications - lithium levels decreasing Activity - pt calls when she needs to get up

## 2015-02-28 NOTE — Progress Notes (Signed)
Previously received discharge orders. In process of discharging patient when received call from Dr. Volanda Napoleon regarding need to delay discharge until seen by Dr. Weber Cooks. Spoke with Dr. Weber Cooks and was instructed to discharge patient and instruct patient to follow up out patient with Dr. Jacqualine Code. Followed up with Dr. Volanda Napoleon regarding conversation with Dr. Weber Cooks. Order received to discharge patient. Discharge carried out. Discharged via wheelchair by auxiliary staff.

## 2015-02-28 NOTE — Discharge Summary (Signed)
[facility] [place]   PATIENT NAME: Carol [name]    MR#:  [id number]  DATE OF BIRTH:  May 05, 1956  DATE OF ADMISSION:  02/26/2015 ADMITTING PHYSICIAN: Dustin Flock, MD  DATE OF DISCHARGE: 02/20/15  PRIMARY CARE PHYSICIAN: Christie Nottingham., PA    ADMISSION DIAGNOSIS:  Slurred speech [R47.81] Ischemic stroke [I63.50] Elevated lithium level [R79.9]  DISCHARGE DIAGNOSIS:  Principal Problem:   Lithium intoxication Active Problems:   CVA (cerebral infarction)   Bipolar disorder   SECONDARY DIAGNOSIS:   Past Medical History  Diagnosis Date  . Coronary artery disease   . Hypertension   . Bipolar affective disorder   . Hyperlipidemia   . Hypothyroid   . Myocardial infarction     HOSPITAL COURSE:    1. Lithium toxicity with GI and neurological symptoms. The patient presented with nausea, diarrhea, ataxia and confusion. Initial lithium level was 2.4 which came down to 1.05 with hydration. She is feeling much better at this time and is back to her mental status baseline. She would like to avoid going back on lithium if possible. She will follow up with her psychiatrist Dr. Randel Books in the outpatient setting. She has been referred to outpatient physical therapy and given a prescription for rolling walker. Physical manifestations should improve over time. MRI brain negative for acute stroke. 2. Bipolar disorder- appreciate psychiatry consultation. She will remain on her prior medications with exception of lithium. She will follow-up with her outpatient psychiatrist upon discharge. 3. Hyperlipidemia unspecified continue simvastatin 4. Hypotension on presentation-resolved with IV fluids 5. History of old stroke seen on MRI- continue Plavix and simvastatin. 6. Hypothyroidism unspecified continue levothyroxine.  DISCHARGE CONDITIONS:   Fair  CONSULTS OBTAINED:  Treatment Team:  Dustin Flock, MD Gonzella Lex,  MD  DRUG ALLERGIES:   Allergies  Allergen Reactions  . Morphine Other (See Comments)  . Pregabalin Other (See Comments)  . Rofecoxib Other (See Comments)  . Sulfamethoxazole-Trimethoprim Other (See Comments)  . Asa [Aspirin] Hives, Nausea Only and Rash    Rash also    DISCHARGE MEDICATIONS:   Current Discharge Medication List    CONTINUE these medications which have NOT CHANGED   Details  amitriptyline (ELAVIL) 50 MG tablet Take 50 mg by mouth at bedtime.    budesonide-formoterol (SYMBICORT) 160-4.5 MCG/ACT inhaler Inhale 1 puff into the lungs 2 (two) times daily.    cetirizine (ZYRTEC) 10 MG tablet Take 10 mg by mouth daily.    citalopram (CELEXA) 40 MG tablet Take 40 mg by mouth daily.    clopidogrel (PLAVIX) 75 MG tablet Take 75 mg by mouth daily.    ferrous sulfate 325 (65 FE) MG tablet Take 325 mg by mouth every 7 (seven) days.    levothyroxine (SYNTHROID, LEVOTHROID) 100 MCG tablet Take 100 mcg by mouth daily.    lisinopril (PRINIVIL,ZESTRIL) 5 MG tablet Take 5 mg by mouth daily.    lithium carbonate 300 MG capsule Take 300-600 mg by mouth 2 (two) times daily. Pt takes one capsule in the morning and two at bedtime.    metoprolol tartrate (LOPRESSOR) 25 MG tablet Take 1 tablet by mouth daily. Refills: 11    QUEtiapine (SEROQUEL) 300 MG tablet Take 300 mg by mouth at bedtime.    simvastatin (ZOCOR) 20 MG tablet Take 1 tablet by mouth daily. Refills: 0    traMADol (ULTRAM) 50 MG tablet Take 1 tablet (50 mg total) by mouth every 6 (six) hours  as needed. Qty: 10 tablet, Refills: 0    lubiprostone (AMITIZA) 8 MCG capsule Take 8 mcg by mouth 2 (two) times daily as needed for constipation.         DISCHARGE INSTRUCTIONS:    DIET:  Regular diet  DISCHARGE CONDITION:  Fair  ACTIVITY:  Activity as tolerated  OXYGEN:  Home Oxygen: No.   Oxygen Delivery: room air  DISCHARGE LOCATION:  home   If you experience worsening of your admission symptoms,  develop shortness of breath, life threatening emergency, suicidal or homicidal thoughts you must seek medical attention immediately by calling 911 or calling your MD immediately  if symptoms less severe.  You Must read complete instructions/literature along with all the possible adverse reactions/side effects for all the Medicines you take and that have been prescribed to you. Take any new Medicines after you have completely understood and accpet all the possible adverse reactions/side effects.   Please note  You were cared for by a hospitalist during your hospital stay. If you have any questions about your discharge medications or the care you received while you were in the hospital after you are discharged, you can call the unit and asked to speak with the hospitalist on call if the hospitalist that took care of you is not available. Once you are discharged, your primary care physician will handle any further medical issues. Please note that NO REFILLS for any discharge medications will be authorized once you are discharged, as it is imperative that you return to your primary care physician (or establish a relationship with a primary care physician if you do not have one) for your aftercare needs so that they can reassess your need for medications and monitor your lab values.  Today   CHIEF COMPLAINT:   Chief Complaint  Patient presents with  . Weakness    HISTORY OF PRESENT ILLNESS:  Carol [name] is a 59 y.o. female with a known history of coronary artery disease, bipolar affective disorder, nicotine addiction, hypertension, hyperlipidemia, and hypothyroidism who presents to the emergency room with the difficulty with her speech as well as her thought process and right upper extremity and right lower extremity weakness. She reports that she thinks it started Monday evening but could've started yesterday. She is able to ambulate. She reports that her speech feels slurred and garbled. She also is  having trouble with concentrating. Denies any numbness no visual difficulties.  VITAL SIGNS:  Blood pressure 141/48, pulse 59, temperature 97.5 F (36.4 C), temperature source Oral, resp. rate 20, height 5' (1.524 m), weight 69.032 kg (152 lb 3 oz), SpO2 100 %.  I/O:   Intake/Output Summary (Last 24 hours) at 02/28/15 1239 Last data filed at 02/28/15 1134  Gross per 24 hour  Intake    240 ml  Output   2100 ml  Net  -1860 ml    PHYSICAL EXAMINATION:  GENERAL:  59 y.o.-year-old patient lying in the bed with no acute distress.  EYES: Pupils equal, round, reactive to light and accommodation. No scleral icterus. Extraocular muscles intact.  HEENT: Head atraumatic, normocephalic. Oropharynx and nasopharynx clear. Mucous membranes are moist NECK:  Supple, no jugular venous distention. No thyroid enlargement, no tenderness.  LUNGS: Normal breath sounds bilaterally, no wheezing, rales,rhonchi or crepitation. No use of accessory muscles of respiration.  CARDIOVASCULAR: S1, S2 normal. No murmurs, rubs, or gallops.  ABDOMEN: Soft, non-tender, non-distended. Bowel sounds present. No organomegaly or mass.  EXTREMITIES: No pedal edema, cyanosis, or clubbing.  NEUROLOGIC:  Cranial nerves II through XII are intact. Muscle strength 5/5 in all extremities. Sensation intact. Gait not checked.  PSYCHIATRIC: The patient is alert and oriented x 3.  SKIN: No obvious rash, lesion, or ulcer.   DATA REVIEW:   CBC  Recent Labs Lab 02/26/15 1144  WBC 7.2  HGB 10.8*  HCT 32.1*  PLT 180    Chemistries   Recent Labs Lab 02/26/15 1144 02/28/15 0513  NA 136 138  K 4.3 3.5  CL 107 110  CO2 26 21*  GLUCOSE 86 112*  BUN 16 12  CREATININE 1.20* 0.91  CALCIUM 9.8 9.8  AST 18  --   ALT 14  --   ALKPHOS 148*  --   BILITOT 0.5  --     Cardiac Enzymes No results for input(s): TROPONINI in the last 168 hours.  Microbiology Results  No results found for this or any previous visit.  RADIOLOGY:   Mr Herby Abraham Contrast  02/26/2015   CLINICAL DATA:  Speech disturbance and right leg weakness beginning last night.  EXAM: MRI HEAD WITHOUT CONTRAST  TECHNIQUE: Multiplanar, multiecho pulse sequences of the brain and surrounding structures were obtained without intravenous contrast.  COMPARISON:  Head CT 02/26/2015  FINDINGS: Diffusion imaging does not show any acute or subacute infarction. The brainstem is normal. There is an old small vessel cerebellar infarction on the left. Cerebral hemispheres show an old lacunar infarction in the anterior caudate on the left and minimal small vessel change of the white matter. No cortical or large vessel territory insult. No mass lesion, hemorrhage, hydrocephalus or extra-axial collection. No pituitary mass. There is mild mucosal thickening of the paranasal sinuses.  IMPRESSION: No acute finding. Old small vessel infarction left cerebellum and left caudate. Mild small vessel change of the cerebral hemispheric white matter.   Electronically Signed   By: Nelson Chimes M.D.   On: 02/26/2015 18:28   US Carotid Bilateral  02/26/2015   CLINICAL DATA:  59 year old female with slurred speech  EXAM: BILATERAL CAROTID DUPLEX ULTRASOUND  TECHNIQUE: Pearline Cables scale imaging, color Doppler and duplex ultrasound were performed of bilateral carotid and vertebral arteries in the neck.  COMPARISON:  Brain MRI 02/26/2015; prior carotid duplex ultrasound 10/05/2007  FINDINGS: Criteria: Quantification of carotid stenosis is based on velocity parameters that correlate the residual internal carotid diameter with NASCET-based stenosis levels, using the diameter of the distal internal carotid lumen as the denominator for stenosis measurement.  The following velocity measurements were obtained:  RIGHT  ICA:  88/31 cm/sec  CCA:  70/01 cm/sec  SYSTOLIC ICA/CCA RATIO:  1.3  DIASTOLIC ICA/CCA RATIO:  1.7  ECA:  120 cm/sec  LEFT  ICA:  103/19 cm/sec  CCA:  74/94 cm/sec  SYSTOLIC ICA/CCA RATIO:  1.2   DIASTOLIC ICA/CCA RATIO:  1.0  ECA:  124 cm/sec  RIGHT CAROTID ARTERY: Mild heterogeneous atherosclerotic plaque in the distal common carotid artery and proximal carotid bifurcation. No evidence of internal carotid stenosis.  RIGHT VERTEBRAL ARTERY:  Patent with normal antegrade flow.  LEFT CAROTID ARTERY: Trace smooth heterogeneous atherosclerotic plaque in the carotid bifurcation extending into the proximal internal carotid artery. By peak systolic velocity criteria, the estimated stenosis remains less than 50%.  LEFT VERTEBRAL ARTERY:  Patent with normal antegrade flow.  IMPRESSION: 1. Mild heterogeneous atherosclerotic plaque in the distal right common carotid artery without evidence of internal carotid stenosis. 2. Mild (1-49%) stenosis proximal left internal carotid artery secondary to smooth but heterogeneous atherosclerotic plaque. 3.  Vertebral arteries are patent with normal antegrade flow.  Signed,  Criselda Peaches, MD  [facility]  [facility]   Electronically Signed   By: Jacqulynn Cadet M.D.   On: 02/26/2015 19:48    EKG:   Orders placed or performed during the hospital encounter of 02/03/15  . EKG 12-Lead  . EKG 12-Lead    Management plans discussed with the patient, family and they are in agreement.  CODE STATUS:     Code Status Orders        Start     Ordered   02/26/15 1706  Full code   Continuous     02/26/15 1705      TOTAL TIME TAKING CARE OF THIS PATIENT: 40 minutes.  Greater than 50% of time spent in care coordination and counseling.  Myrtis Ser M.D on 02/28/2015 at 12:39 PM  Between 7am to 6pm - Pager - [phone]  After 6pm go to [email or web address] - password [email or web address] [facility]  [facility]  Office  [phone]  CC: Primary care physician; Christie Nottingham., PA

## 2015-02-28 NOTE — Care Management Important Message (Signed)
Important Message  Patient Details  Name: Carol [name] MRN: [id number] Date of Birth: 24-Apr-1956   Medicare Important Message Given:  Yes-second notification given    Juliann Pulse A Allmond 02/28/2015, 9:35 AM

## 2015-02-28 NOTE — Care Management Note (Signed)
Case Management Note  Patient Details  Name: Carol [name] MRN: [id number] Date of Birth: 11/14/1955  Subjective/Objective:   Rolling walker to be delivered to room by [place]. Patient to follow up with OP physical therapy and states she is aware of how to do that. Primary nurse updated and will give prescription for OP PT. Will sign off.                  Action/Plan: Home with OP PT  Expected Discharge Date:    02/28/2015              Expected Discharge Plan:  Home/Self Care  In-House Referral:     Discharge planning Services     Post Acute Care Choice:  Durable Medical Equipment Choice offered to:  Patient  DME Arranged:  [facility]ford Rile rolling DME Agency:  [place]:    [place]:     Status of Service:  Completed, signed off  Medicare Important Message Given:    Date Medicare IM Given:    Medicare IM give by:    Date Additional Medicare IM Given:    Additional Medicare Important Message give by:     If discussed at [place] of Stay Meetings, dates discussed:    Additional Comments:  Jolly Mango, RN 02/28/2015, 9:28 AM

## 2015-02-28 NOTE — Plan of Care (Addendum)
Problem: Discharge Progression Outcomes Goal: Other Discharge Outcomes/Goals Outcome: Completed/Met Date Met:  02/28/15 MD making rounds. Discharge orders received. No appointments ordered by MD to be scheduled. Telemetry discontinued. IV removed. Discharge paperwork provided, explained, signed and witnessed. No unanswered questions.   Discharge postponed.

## 2015-02-28 NOTE — Progress Notes (Addendum)
MD making rounds. Discharge orders received. No appointments ordered by MD to be scheduled. Telemetry discontinued. IV removed. Discharge paperwork provided, explained, signed and witnessed. No unanswered questions.   Discharge postponed.

## 2015-03-01 NOTE — Care Management Note (Signed)
Case Management Note  Patient Details  Name: Carol [name] MRN: [id number] Date of Birth: 12-31-1955  Subjective/Objective:          Ms Carol [name] called today and stated that she did not know how to arrange getting out patient PT. She was instructed by this writer to call her PCP on Monday, tell him/her that she was discharged from [facility] with an MD order to go to outpatient PT/OT and that she needs a referral/appointment with an outpatient PT/OT provider.          Action/Plan:   Expected Discharge Date:                  Expected Discharge Plan:  Home/Self Care  In-House Referral:     Discharge planning Services     Post Acute Care Choice:  Durable Medical Equipment Choice offered to:  Patient  DME Arranged:  Walker rolling DME Agency:  [place]:    [place]:     Status of Service:  Completed, signed off  Medicare Important Message Given:  Yes-second notification given Date Medicare IM Given:    Medicare IM give by:    Date Additional Medicare IM Given:    Additional Medicare Important Message give by:     If discussed at [place] of Stay Meetings, dates discussed:    Additional Comments:  Leaira Fullam A, RN 03/01/2015, 12:10 PM

## 2015-03-17 DIAGNOSIS — F410 Panic disorder [episodic paroxysmal anxiety] without agoraphobia: Secondary | ICD-10-CM | POA: Diagnosis not present

## 2015-03-21 DIAGNOSIS — I63542 Cerebral infarction due to unspecified occlusion or stenosis of left cerebellar artery: Secondary | ICD-10-CM | POA: Diagnosis not present

## 2015-03-21 DIAGNOSIS — K5900 Constipation, unspecified: Secondary | ICD-10-CM | POA: Diagnosis not present

## 2015-03-21 DIAGNOSIS — F339 Major depressive disorder, recurrent, unspecified: Secondary | ICD-10-CM | POA: Diagnosis not present

## 2015-03-21 DIAGNOSIS — R7889 Finding of other specified substances, not normally found in blood: Secondary | ICD-10-CM | POA: Diagnosis not present

## 2015-03-21 DIAGNOSIS — J449 Chronic obstructive pulmonary disease, unspecified: Secondary | ICD-10-CM | POA: Diagnosis not present

## 2015-03-21 DIAGNOSIS — I6522 Occlusion and stenosis of left carotid artery: Secondary | ICD-10-CM | POA: Diagnosis not present

## 2015-03-22 DIAGNOSIS — D509 Iron deficiency anemia, unspecified: Secondary | ICD-10-CM | POA: Diagnosis not present

## 2015-03-22 DIAGNOSIS — I10 Essential (primary) hypertension: Secondary | ICD-10-CM | POA: Diagnosis not present

## 2015-03-22 DIAGNOSIS — J449 Chronic obstructive pulmonary disease, unspecified: Secondary | ICD-10-CM | POA: Diagnosis not present

## 2015-03-22 DIAGNOSIS — I2510 Atherosclerotic heart disease of native coronary artery without angina pectoris: Secondary | ICD-10-CM | POA: Diagnosis not present

## 2015-03-22 DIAGNOSIS — M109 Gout, unspecified: Secondary | ICD-10-CM | POA: Diagnosis not present

## 2015-03-22 DIAGNOSIS — Z7902 Long term (current) use of antithrombotics/antiplatelets: Secondary | ICD-10-CM | POA: Diagnosis not present

## 2015-03-22 DIAGNOSIS — F319 Bipolar disorder, unspecified: Secondary | ICD-10-CM | POA: Diagnosis not present

## 2015-03-22 DIAGNOSIS — I69351 Hemiplegia and hemiparesis following cerebral infarction affecting right dominant side: Secondary | ICD-10-CM | POA: Diagnosis not present

## 2015-03-22 DIAGNOSIS — I2584 Coronary atherosclerosis due to calcified coronary lesion: Secondary | ICD-10-CM | POA: Diagnosis not present

## 2015-03-22 DIAGNOSIS — Z9181 History of falling: Secondary | ICD-10-CM | POA: Diagnosis not present

## 2015-03-26 DIAGNOSIS — I2510 Atherosclerotic heart disease of native coronary artery without angina pectoris: Secondary | ICD-10-CM | POA: Diagnosis not present

## 2015-03-26 DIAGNOSIS — F319 Bipolar disorder, unspecified: Secondary | ICD-10-CM | POA: Diagnosis not present

## 2015-03-26 DIAGNOSIS — I69351 Hemiplegia and hemiparesis following cerebral infarction affecting right dominant side: Secondary | ICD-10-CM | POA: Diagnosis not present

## 2015-03-26 DIAGNOSIS — J449 Chronic obstructive pulmonary disease, unspecified: Secondary | ICD-10-CM | POA: Diagnosis not present

## 2015-03-26 DIAGNOSIS — I2584 Coronary atherosclerosis due to calcified coronary lesion: Secondary | ICD-10-CM | POA: Diagnosis not present

## 2015-03-26 DIAGNOSIS — D509 Iron deficiency anemia, unspecified: Secondary | ICD-10-CM | POA: Diagnosis not present

## 2015-03-28 DIAGNOSIS — I2510 Atherosclerotic heart disease of native coronary artery without angina pectoris: Secondary | ICD-10-CM | POA: Diagnosis not present

## 2015-03-28 DIAGNOSIS — I69351 Hemiplegia and hemiparesis following cerebral infarction affecting right dominant side: Secondary | ICD-10-CM | POA: Diagnosis not present

## 2015-03-28 DIAGNOSIS — J449 Chronic obstructive pulmonary disease, unspecified: Secondary | ICD-10-CM | POA: Diagnosis not present

## 2015-03-28 DIAGNOSIS — D509 Iron deficiency anemia, unspecified: Secondary | ICD-10-CM | POA: Diagnosis not present

## 2015-03-28 DIAGNOSIS — F319 Bipolar disorder, unspecified: Secondary | ICD-10-CM | POA: Diagnosis not present

## 2015-03-28 DIAGNOSIS — I2584 Coronary atherosclerosis due to calcified coronary lesion: Secondary | ICD-10-CM | POA: Diagnosis not present

## 2015-03-30 ENCOUNTER — Emergency Department: Payer: Medicare Other

## 2015-03-30 ENCOUNTER — Observation Stay
Admission: EM | Admit: 2015-03-30 | Discharge: 2015-03-31 | Disposition: A | Discharge status: Home / Self Care | Payer: Medicare Other | Attending: Internal Medicine | Admitting: Internal Medicine

## 2015-03-30 DIAGNOSIS — I25110 Atherosclerotic heart disease of native coronary artery with unstable angina pectoris: Secondary | ICD-10-CM | POA: Diagnosis not present

## 2015-03-30 DIAGNOSIS — Z886 Allergy status to analgesic agent status: Secondary | ICD-10-CM | POA: Insufficient documentation

## 2015-03-30 DIAGNOSIS — Z7902 Long term (current) use of antithrombotics/antiplatelets: Secondary | ICD-10-CM | POA: Insufficient documentation

## 2015-03-30 DIAGNOSIS — E785 Hyperlipidemia, unspecified: Secondary | ICD-10-CM | POA: Insufficient documentation

## 2015-03-30 DIAGNOSIS — E039 Hypothyroidism, unspecified: Secondary | ICD-10-CM | POA: Diagnosis not present

## 2015-03-30 DIAGNOSIS — F319 Bipolar disorder, unspecified: Secondary | ICD-10-CM | POA: Diagnosis not present

## 2015-03-30 DIAGNOSIS — Z888 Allergy status to other drugs, medicaments and biological substances status: Secondary | ICD-10-CM | POA: Diagnosis not present

## 2015-03-30 DIAGNOSIS — R202 Paresthesia of skin: Secondary | ICD-10-CM

## 2015-03-30 DIAGNOSIS — R51 Headache: Secondary | ICD-10-CM | POA: Diagnosis not present

## 2015-03-30 DIAGNOSIS — Z885 Allergy status to narcotic agent status: Secondary | ICD-10-CM | POA: Diagnosis not present

## 2015-03-30 DIAGNOSIS — I739 Peripheral vascular disease, unspecified: Secondary | ICD-10-CM | POA: Insufficient documentation

## 2015-03-30 DIAGNOSIS — Z882 Allergy status to sulfonamides status: Secondary | ICD-10-CM | POA: Insufficient documentation

## 2015-03-30 DIAGNOSIS — G43009 Migraine without aura, not intractable, without status migrainosus: Secondary | ICD-10-CM | POA: Diagnosis not present

## 2015-03-30 DIAGNOSIS — F17200 Nicotine dependence, unspecified, uncomplicated: Secondary | ICD-10-CM | POA: Diagnosis not present

## 2015-03-30 DIAGNOSIS — I10 Essential (primary) hypertension: Secondary | ICD-10-CM | POA: Diagnosis not present

## 2015-03-30 DIAGNOSIS — Z8249 Family history of ischemic heart disease and other diseases of the circulatory system: Secondary | ICD-10-CM | POA: Insufficient documentation

## 2015-03-30 DIAGNOSIS — Z8673 Personal history of transient ischemic attack (TIA), and cerebral infarction without residual deficits: Secondary | ICD-10-CM | POA: Diagnosis not present

## 2015-03-30 DIAGNOSIS — I252 Old myocardial infarction: Secondary | ICD-10-CM | POA: Insufficient documentation

## 2015-03-30 DIAGNOSIS — I2510 Atherosclerotic heart disease of native coronary artery without angina pectoris: Secondary | ICD-10-CM | POA: Insufficient documentation

## 2015-03-30 DIAGNOSIS — R200 Anesthesia of skin: Secondary | ICD-10-CM

## 2015-03-30 DIAGNOSIS — R209 Unspecified disturbances of skin sensation: Secondary | ICD-10-CM | POA: Diagnosis not present

## 2015-03-30 DIAGNOSIS — Z79899 Other long term (current) drug therapy: Secondary | ICD-10-CM | POA: Diagnosis not present

## 2015-03-30 HISTORY — DX: Cerebral infarction, unspecified: I63.9

## 2015-03-30 LAB — BASIC METABOLIC PANEL
Anion gap: 8 (ref 5–15)
BUN: 20 mg/dL (ref 6–20)
CALCIUM: 9.5 mg/dL (ref 8.9–10.3)
CO2: 24 mmol/L (ref 22–32)
CREATININE: 0.97 mg/dL (ref 0.44–1.00)
Chloride: 107 mmol/L (ref 101–111)
GLUCOSE: 108 mg/dL — AB (ref 65–99)
Potassium: 3.6 mmol/L (ref 3.5–5.1)
SODIUM: 139 mmol/L (ref 135–145)

## 2015-03-30 LAB — PROTIME-INR
INR: 0.92
PROTHROMBIN TIME: 12.6 s (ref 11.4–15.0)

## 2015-03-30 LAB — ETHANOL: ALCOHOL ETHYL (B): 6 mg/dL — AB (ref <5)

## 2015-03-30 LAB — CBC
HEMATOCRIT: 34.3 % — AB (ref 35.0–47.0)
Hemoglobin: 11.5 g/dL — ABNORMAL LOW (ref 12.0–16.0)
MCH: 32.3 pg (ref 26.0–34.0)
MCHC: 33.6 g/dL (ref 32.0–36.0)
MCV: 96.1 fL (ref 80.0–100.0)
Platelets: 193 10*3/uL (ref 150–440)
RBC: 3.57 MIL/uL — ABNORMAL LOW (ref 3.80–5.20)
RDW: 14 % (ref 11.5–14.5)
WBC: 6.9 10*3/uL (ref 3.6–11.0)

## 2015-03-30 LAB — LITHIUM LEVEL

## 2015-03-30 LAB — TROPONIN I: Troponin I: <0.03 ng/mL (ref <0.031)

## 2015-03-30 LAB — APTT: aPTT: 28 seconds (ref 24–36)

## 2015-03-30 LAB — SEDIMENTATION RATE: Sed Rate: 47 mm/hr — ABNORMAL HIGH (ref 0–30)

## 2015-03-30 MED ORDER — CLOPIDOGREL BISULFATE 75 MG PO TABS
75.0000 mg | ORAL_TABLET | Freq: Every day | ORAL | Status: DC
  Administered 2015-03-31: 08:00:00 75 mg via ORAL
  Filled 2015-03-30: qty 1

## 2015-03-30 MED ORDER — LISINOPRIL 5 MG PO TABS
5.0000 mg | ORAL_TABLET | Freq: Every day | ORAL | Status: DC
  Administered 2015-03-31: 08:00:00 5 mg via ORAL
  Filled 2015-03-30: qty 1

## 2015-03-30 MED ORDER — METOPROLOL TARTRATE 25 MG PO TABS
25.0000 mg | ORAL_TABLET | Freq: Every day | ORAL | Status: DC
  Administered 2015-03-31: 25 mg via ORAL
  Filled 2015-03-30: qty 1

## 2015-03-30 MED ORDER — SODIUM CHLORIDE 0.9 % IJ SOLN
3.0000 mL | Freq: Two times a day (BID) | INTRAMUSCULAR | Status: DC
  Administered 2015-03-30 – 2015-03-31 (×2): 3 mL via INTRAVENOUS

## 2015-03-30 MED ORDER — LORATADINE 10 MG PO TABS
10.0000 mg | ORAL_TABLET | Freq: Every day | ORAL | Status: DC
  Administered 2015-03-31: 08:00:00 10 mg via ORAL
  Filled 2015-03-30: qty 1

## 2015-03-30 MED ORDER — SODIUM CHLORIDE 0.9 % IV SOLN: 250.0000 mL | INTRAVENOUS | Status: DC | PRN

## 2015-03-30 MED ORDER — SIMVASTATIN 20 MG PO TABS
20.0000 mg | ORAL_TABLET | Freq: Every day | ORAL | Status: DC
  Administered 2015-03-31: 08:00:00 20 mg via ORAL
  Filled 2015-03-30: qty 1

## 2015-03-30 MED ORDER — BUDESONIDE-FORMOTEROL FUMARATE 160-4.5 MCG/ACT IN AERO
1.0000 | INHALATION_SPRAY | Freq: Two times a day (BID) | RESPIRATORY_TRACT | Status: DC
  Administered 2015-03-31 (×2): 1 via RESPIRATORY_TRACT
  Filled 2015-03-30: qty 6

## 2015-03-30 MED ORDER — OXYCODONE HCL 5 MG PO TABS
5.0000 mg | ORAL_TABLET | ORAL | Status: AC
Start: 2015-03-30 — End: 2015-03-30
  Administered 2015-03-30: 5 mg via ORAL
  Filled 2015-03-30: qty 1

## 2015-03-30 MED ORDER — FERROUS SULFATE 325 (65 FE) MG PO TABS
325.0000 mg | ORAL_TABLET | ORAL | Status: DC
  Administered 2015-03-30: 325 mg via ORAL
  Filled 2015-03-30: qty 1

## 2015-03-30 MED ORDER — TRAMADOL HCL 50 MG PO TABS
50.0000 mg | ORAL_TABLET | Freq: Four times a day (QID) | ORAL | Status: DC | PRN
  Administered 2015-03-30: 50 mg via ORAL
  Filled 2015-03-30: qty 1

## 2015-03-30 MED ORDER — QUETIAPINE FUMARATE 200 MG PO TABS
400.0000 mg | ORAL_TABLET | Freq: Every day | ORAL | Status: DC
Start: 2015-03-30 — End: 2015-03-31
  Administered 2015-03-30: 400 mg via ORAL
  Filled 2015-03-30: qty 2

## 2015-03-30 MED ORDER — AMITRIPTYLINE HCL 50 MG PO TABS
50.0000 mg | ORAL_TABLET | Freq: Every day | ORAL | Status: DC
  Administered 2015-03-30: 50 mg via ORAL
  Filled 2015-03-30: qty 1

## 2015-03-30 MED ORDER — LEVOTHYROXINE SODIUM 100 MCG PO TABS
100.0000 ug | ORAL_TABLET | Freq: Every day | ORAL | Status: DC
  Administered 2015-03-31: 08:00:00 100 ug via ORAL
  Filled 2015-03-30: qty 1

## 2015-03-30 MED ORDER — SODIUM CHLORIDE 0.9 % IJ SOLN: 3.0000 mL | INTRAMUSCULAR | Status: DC | PRN

## 2015-03-30 MED ORDER — CITALOPRAM HYDROBROMIDE 20 MG PO TABS
40.0000 mg | ORAL_TABLET | Freq: Every day | ORAL | Status: DC
  Administered 2015-03-31: 08:00:00 40 mg via ORAL
  Filled 2015-03-30: qty 2

## 2015-03-30 MED ORDER — METOCLOPRAMIDE HCL 5 MG/ML IJ SOLN
10.0000 mg | Freq: Once | INTRAMUSCULAR | Status: AC
Start: 2015-03-30 — End: 2015-03-30
  Administered 2015-03-30: 10 mg via INTRAVENOUS
  Filled 2015-03-30: qty 2

## 2015-03-30 NOTE — ED Notes (Signed)
Pt on [place] at this time. RN at bedside.

## 2015-03-30 NOTE — ED Notes (Signed)
MD Qual[place] at bedside. 

## 2015-03-30 NOTE — ED Notes (Signed)
Right sided facial numbness and severe headache x 1 hr.

## 2015-03-30 NOTE — H&P (Signed)
Carol [name] is an 59 y.o. female.   Chief Complaint: Headache and right facial numbness HPI: Awoke this morning with headache and right facial numbness. Headache improved with pain meds but continues to have right facial numbness. Numbness unchanged. No other neurologic symptoms.  Past Medical History  Diagnosis Date  . Coronary artery disease   . Hypertension   . Bipolar affective disorder   . Hyperlipidemia   . Hypothyroid   . Myocardial infarction     Past Surgical History  Procedure Laterality Date  . Appendectomy    . Hysterotomy      Family History  Problem Relation Age of Onset  . CAD     Social History:  reports that she has been smoking.  She has never used smokeless tobacco. She reports that she does not drink alcohol or use illicit drugs.  Allergies:  Allergies  Allergen Reactions  . Morphine Other (See Comments)  . Pregabalin Other (See Comments)  . Rofecoxib Other (See Comments)  . Sulfamethoxazole-Trimethoprim Other (See Comments)  . Asa [Aspirin] Hives, Nausea Only and Rash    Rash also     (Not in a hospital admission)  Results for orders placed or performed during the hospital encounter of 03/30/15 (from the past 48 hour(s))  APTT     Status: None   Collection Time: 03/30/15  8:01 PM  Result Value Ref Range   aPTT 28 24 - 36 seconds  Basic metabolic panel     Status: Abnormal   Collection Time: 03/30/15  8:01 PM  Result Value Ref Range   Sodium 139 135 - 145 mmol/L   Potassium 3.6 3.5 - 5.1 mmol/L   Chloride 107 101 - 111 mmol/L   CO2 24 22 - 32 mmol/L   Glucose, Bld 108 (H) 65 - 99 mg/dL   BUN 20 6 - 20 mg/dL   Creatinine, Ser 0.97 0.44 - 1.00 mg/dL   Calcium 9.5 8.9 - 10.3 mg/dL   GFR calc non Af Amer >60 >60 mL/min   GFR calc Af Amer >60 >60 mL/min    Comment: (NOTE) The eGFR has been calculated using the CKD EPI equation. This calculation has not been validated in all clinical situations. eGFR's persistently <60 mL/min signify  possible Chronic Kidney Disease.    Anion gap 8 5 - 15  CBC     Status: Abnormal   Collection Time: 03/30/15  8:01 PM  Result Value Ref Range   WBC 6.9 3.6 - 11.0 K/uL   RBC 3.57 (L) 3.80 - 5.20 MIL/uL   Hemoglobin 11.5 (L) 12.0 - 16.0 g/dL   HCT 34.3 (L) 35.0 - 47.0 %   MCV 96.1 80.0 - 100.0 fL   MCH 32.3 26.0 - 34.0 pg   MCHC 33.6 32.0 - 36.0 g/dL   RDW 14.0 11.5 - 14.5 %   Platelets 193 150 - 440 K/uL  Troponin I     Status: None   Collection Time: 03/30/15  8:01 PM  Result Value Ref Range   Troponin I <0.03 <0.031 ng/mL    Comment:        NO INDICATION OF MYOCARDIAL INJURY.   Protime-INR     Status: None   Collection Time: 03/30/15  8:01 PM  Result Value Ref Range   Prothrombin Time 12.6 11.4 - 15.0 seconds   INR 0.92    Ct Head Wo Contrast  03/30/2015   CLINICAL DATA:  Acute onset of right facial numbness and headache. Initial  encounter.  EXAM: CT HEAD WITHOUT CONTRAST  TECHNIQUE: Contiguous axial images were obtained from the base of the skull through the vertex without intravenous contrast.  COMPARISON:  CT of the head and MRI of the brain performed 02/26/2015  FINDINGS: There is no evidence of acute infarction, mass lesion, or intra- or extra-axial hemorrhage on CT.  Mild periventricular white matter change likely reflects small vessel ischemic microangiopathy. A small chronic lacunar infarct is noted at the left caudate.  The posterior fossa, including the cerebellum, brainstem and fourth ventricle, is within normal limits. The third and lateral ventricles are unremarkable in appearance. The cerebral hemispheres are symmetric in appearance, with normal gray-white differentiation. No mass effect or midline shift is seen.  There is no evidence of fracture; visualized osseous structures are unremarkable in appearance. The visualized portions of the orbits are within normal limits. The paranasal sinuses and mastoid air cells are well-aerated. No significant soft tissue  abnormalities are seen.  IMPRESSION: 1. No acute intracranial pathology seen on CT. 2. Mild small vessel ischemic microangiopathy and small chronic lacunar infarct at the left caudate. These results were called by telephone at the time of interpretation on 03/30/2015 at 8:20 pm to Dr. Delman Kitten, who verbally acknowledged these results.   Electronically Signed   By: Garald Balding M.D.   On: 03/30/2015 20:22    Review of Systems  Constitutional: Negative for fever and chills.  HENT: Negative for hearing loss.   Eyes: Negative for blurred vision.  Respiratory: Negative for shortness of breath.   Cardiovascular: Negative for chest pain.  Gastrointestinal: Negative for nausea, vomiting and diarrhea.  Genitourinary: Negative for dysuria.  Musculoskeletal: Negative for back pain.  Skin: Negative for rash.  Neurological: Positive for sensory change and headaches.  Endo/Heme/Allergies: Does not bruise/bleed easily.  Psychiatric/Behavioral: Positive for depression.    Blood pressure 122/53, pulse 67, temperature 97.7 F (36.5 C), temperature source Oral, resp. rate 22, height 5' (1.524 m), weight 68.947 kg (152 lb), SpO2 94 %. Physical Exam  Constitutional: She is oriented to person, place, and time. She appears well-developed and well-nourished. No distress.  HENT:  Head: Normocephalic and atraumatic.  Mouth/Throat: Oropharynx is clear and moist. No oropharyngeal exudate.  Eyes: EOM are normal. Pupils are equal, round, and reactive to light. No scleral icterus.  Neck: Neck supple. No JVD present. No thyromegaly present.  Cardiovascular: Normal rate.   Murmur heard. Respiratory: No respiratory distress.  Clear to ascultation. No dullness to percussion.  GI: Soft. Bowel sounds are normal. She exhibits no mass.  Musculoskeletal: She exhibits no edema or tenderness.  Lymphadenopathy:    She has no cervical adenopathy.  Neurological: She is alert and oriented to person, place, and time.   Right sided facial numbness otherwise cranial nerves 2-12 are intact.  4/5 right LE weakness unchanged.  Skin: Skin is warm and dry. No rash noted.     Assessment/Plan 1. Right Facial Numbness: Could be related to headache. However, pt has history of CVA.. Currently on meds for secondary prevention. Will continue plavix and lipitor. No intervention with HTN just continue home meds. MRI to r/o acute CVA.  2. Headache: Improved with pain meds. No bleed on CT.  3. Bipolar: No longer taking lithium. Seraquel increased to 400 mg Qhs.  4. Htn: Continue home meds as above.  T= 35 min  Baxter Hire 03/30/2015, 9:35 PM

## 2015-03-30 NOTE — ED Notes (Signed)
Lab called regarding add on labs at this time. Will add on.

## 2015-03-30 NOTE — ED Provider Notes (Signed)
[facility] Emergency Department Provider Note  ____________________________________________  Time seen: Approximately 8:05 PM  I have reviewed the triage vital signs and the nursing notes.   HISTORY  Chief Complaint Migraine    HPI Carol [name] is a 59 y.o. female reports that she had sudden onset of a severe headache starting approximately one hour, which she relates as about 6:45 PM. She reports that with this headache she has noticed severe throbbing sensation over the right side of the face and numbness in the right face. She has chronic weakness in the right arm and right leg and denies any change in this. No numbness or tingling except in the right face. She does feel slightly nauseated. No chest pain or trouble breathing. Denies any overdose, and states she has stopped taking lithium altogether.  She believes she may be having a "migraine" as she has had these previously, but states this is far worse than any migraine that she's previously had. No fevers. No recent illness, except for a diagnosis of an old stroke for which she has been seeing physical therapy for.   Past Medical History  Diagnosis Date  . Coronary artery disease   . Hypertension   . Bipolar affective disorder   . Hyperlipidemia   . Hypothyroid   . Myocardial infarction     Patient Active Problem List   Diagnosis Date Noted  . Lithium intoxication 02/27/2015  . Bipolar disorder 02/27/2015  . CVA (cerebral infarction) 02/26/2015  . Unstable angina 02/03/2015    Past Surgical History  Procedure Laterality Date  . Appendectomy    . Hysterotomy      Current Outpatient Rx  Name  Route  Sig  Dispense  Refill  . amitriptyline (ELAVIL) 50 MG tablet   Oral   Take 50 mg by mouth at bedtime.         . budesonide-formoterol (SYMBICORT) 160-4.5 MCG/ACT inhaler   Inhalation   Inhale 1 puff into the lungs 2 (two) times daily.         . cetirizine (ZYRTEC) 10 MG tablet    Oral   Take 10 mg by mouth daily.         . citalopram (CELEXA) 40 MG tablet   Oral   Take 40 mg by mouth daily.         . clopidogrel (PLAVIX) 75 MG tablet   Oral   Take 75 mg by mouth daily.         . ferrous sulfate 325 (65 FE) MG tablet   Oral   Take 325 mg by mouth every 7 (seven) days.         Marland Kitchen levothyroxine (SYNTHROID, LEVOTHROID) 100 MCG tablet   Oral   Take 100 mcg by mouth daily.         Marland Kitchen lisinopril (PRINIVIL,ZESTRIL) 5 MG tablet   Oral   Take 5 mg by mouth daily.         Marland Kitchen lithium carbonate 300 MG capsule   Oral   Take 300-600 mg by mouth 2 (two) times daily. Pt takes one capsule in the morning and two at bedtime.         Marland Kitchen lubiprostone (AMITIZA) 8 MCG capsule   Oral   Take 8 mcg by mouth 2 (two) times daily as needed for constipation.         . metoprolol tartrate (LOPRESSOR) 25 MG tablet   Oral   Take 1 tablet by mouth daily.  11   . QUEtiapine (SEROQUEL) 300 MG tablet   Oral   Take 300 mg by mouth at bedtime.         . simvastatin (ZOCOR) 20 MG tablet   Oral   Take 1 tablet by mouth daily.      0   . traMADol (ULTRAM) 50 MG tablet   Oral   Take 1 tablet (50 mg total) by mouth every 6 (six) hours as needed.   10 tablet   0     Allergies Morphine; Pregabalin; Rofecoxib; Sulfamethoxazole-trimethoprim; and Asa  Family History  Problem Relation Age of Onset  . CAD      Social History Social History  Substance Use Topics  . Smoking status: Current Every Day Smoker  . Smokeless tobacco: Never Used  . Alcohol Use: No    Review of Systems Constitutional: No fever/chills Eyes: No visual changes except she noticed that she had a blood vessel burst in her left eye yesterday morning and it is read now. No pain in the eye. ENT: No sore throat. Cardiovascular: Denies chest pain. Respiratory: Denies shortness of breath. Gastrointestinal: No abdominal pain.  No nausea, no vomiting.  No diarrhea.  No  constipation. Genitourinary: Negative for dysuria. Musculoskeletal: Negative for back pain. Skin: Negative for rash. Neurological: Negative for focal weakness or numbness except over the right side of the face.  Denies any desire to hurt herself or others. She has bipolar disorder, but states it is well controlled.  10-point ROS otherwise negative.  ____________________________________________   PHYSICAL EXAM:  VITAL SIGNS: ED Triage Vitals  Enc Vitals Group     BP 03/30/15 1940 148/52 mmHg     Pulse Rate 03/30/15 1940 73     Resp 03/30/15 1940 18     Temp 03/30/15 1940 97.7 F (36.5 C)     Temp Source 03/30/15 1940 Oral     SpO2 03/30/15 1940 97 %     Weight 03/30/15 1940 152 lb (68.947 kg)     Height 03/30/15 1940 5' (1.524 m)     Head Cir --      Peak Flow --      Pain Score 03/30/15 1940 8     Pain Loc --      Pain Edu? --      Excl. in [place]? --     Constitutional: Alert and oriented. Well appearing and in no acute distress. Eyes: Conjunctivae are normal on the right, but she does have a sub-conjunctival hemorrhage on the left. PERRL. EOMI. Head: Atraumatic. Nose: No congestion/rhinnorhea. Mouth/Throat: Mucous membranes are moist.  Oropharynx non-erythematous. Neck: No stridor.   Cardiovascular: Normal rate, regular rhythm. Grossly normal heart sounds.  Good peripheral circulation. Respiratory: Normal respiratory effort.  No retractions. Lungs CTAB. Gastrointestinal: Soft and nontender. No distention. No abdominal bruits. No CVA tenderness. Musculoskeletal: No lower extremity tenderness nor edema.  No joint effusions. Neurologic:  Normal speech and language. No gross focal neurologic deficits are appreciated except for proximally 4-5 strength in the right lower leg and subjective numbness over the right side of the face. 5 out of 5 strength in the right arm and left-sided extremities. No sensory deficits in the extremities. No pronator drift. No facial droop. Cranial  nerves intact with exception to numbness over the right side of the face. Skin:  Skin is warm, dry and intact. No rash noted. Psychiatric: Mood and affect are normal. Speech and behavior are normal.  ____________________________________________   LABS (all labs  ordered are listed, but only abnormal results are displayed)  Labs Reviewed  BASIC METABOLIC PANEL - Abnormal; Notable for the following:    Glucose, Bld 108 (*)    All other components within normal limits  CBC - Abnormal; Notable for the following:    RBC 3.57 (*)    Hemoglobin 11.5 (*)    HCT 34.3 (*)    All other components within normal limits  APTT  TROPONIN I  PROTIME-INR  ETHANOL  URINE RAPID DRUG SCREEN, HOSP PERFORMED  LITHIUM LEVEL  SEDIMENTATION RATE   ____________________________________________  EKG  ED ECG REPORT I, Shaddai Shapley, the attending physician, personally viewed and interpreted this ECG.  Date: 03/30/2015 EKG Time: 1957 Rate: 65 Rhythm: normal sinus rhythm QRS Axis: normal Intervals: normal ST/T Wave abnormalities: normal Conduction Disutrbances: none Narrative Interpretation: unremarkable  ____________________________________________  RADIOLOGY   CT Head Wo Contrast (Final result) Result time: 03/30/15 20:22:46   Final result by Rad Results In Interface (03/30/15 20:22:46)   Narrative:   CLINICAL DATA: Acute onset of right facial numbness and headache. Initial encounter.  EXAM: CT HEAD WITHOUT CONTRAST  TECHNIQUE: Contiguous axial images were obtained from the base of the skull through the vertex without intravenous contrast.  COMPARISON: CT of the head and MRI of the brain performed 02/26/2015  FINDINGS: There is no evidence of acute infarction, mass lesion, or intra- or extra-axial hemorrhage on CT.  Mild periventricular white matter change likely reflects small vessel ischemic microangiopathy. A small chronic lacunar infarct is noted at the left  caudate.  The posterior fossa, including the cerebellum, brainstem and fourth ventricle, is within normal limits. The third and lateral ventricles are unremarkable in appearance. The cerebral hemispheres are symmetric in appearance, with normal gray-white differentiation. No mass effect or midline shift is seen.  There is no evidence of fracture; visualized osseous structures are unremarkable in appearance. The visualized portions of the orbits are within normal limits. The paranasal sinuses and mastoid air cells are well-aerated. No significant soft tissue abnormalities are seen.  IMPRESSION: 1. No acute intracranial pathology seen on CT. 2. Mild small vessel ischemic microangiopathy and small chronic lacunar infarct at the left caudate. These results were called by telephone at the time of interpretation on 03/30/2015 at 8:20 pm to Dr. Delman Kitten, who verbally acknowledged these results.     ____________________________________________   PROCEDURES  Procedure(s) performed: None  Critical Care performed: Yes, see critical care note(s)   CRITICAL CARE Performed by: Delman Kitten   Total critical care time: 35  Critical care time was exclusive of separately billable procedures and treating other patients.  Critical care was necessary to treat or prevent imminent or life-threatening deterioration.  Critical care was time spent personally by me on the following activities: development of treatment plan with patient and/or surrogate as well as nursing, discussions with consultants, evaluation of patient's response to treatment, examination of patient, obtaining history from patient or surrogate, ordering and performing treatments and interventions, ordering and review of laboratory studies, ordering and review of radiographic studies, pulse oximetry and re-evaluation of patient's condition.  Patient required immediate evaluation for acute severe headache with reported  right-sided facial numbness. Evaluate and rule out acute stroke and aneurysm. ____________________________________________   INITIAL IMPRESSION / ASSESSMENT AND PLAN / ED COURSE  Pertinent labs & imaging results that were available during my care of the patient were reviewed by me and considered in my medical decision making (see chart for details).  Acute headache with  right-sided facial paresthesia. Chronic and long-standing weakness in the right lower leg per the patient without change. Differential diagnosis would include complex migraine, subarachnoid hemorrhage, ischemic stroke, or other etiologies of severe acute headache. No cardiopulmonary symptoms. As the patient is less than 2 hours from time of onset I have requested stat CT of the head as well as stat neurology consultation by tele-neurology as we continue to work her up and rule out acute neurologic abnormalities.  ----------------------------------------- 8:32 PM on 03/30/2015 -----------------------------------------  CT head shows no acute. Discussed with Dr. Radene Knee radiology. Old ischemic infarctions noted.  ----------------------------------------- 9:09 PM on 03/30/2015 -----------------------------------------  Discussed with neurologist on call from tele-neurology, who advises ESR and as the patient has ongoing numbness over the right face, but no other deficits who will admit her to the hospital for MRI and further evaluation. In her setting with her previous history, I am in agreement with neurology that this is most likely some type of migraine-type syndrome is unlikely to represent a new ischemic stroke or aneurysmal hemorrhage.  ____________________________________________   FINAL CLINICAL IMPRESSION(S) / ED DIAGNOSES  Final diagnoses:  Migraine without aura and without status migrainosus, not intractable  Facial paresthesia      Delman Kitten, MD 03/30/15 2111

## 2015-03-31 ENCOUNTER — Observation Stay: Payer: Medicare Other

## 2015-03-31 DIAGNOSIS — I10 Essential (primary) hypertension: Secondary | ICD-10-CM | POA: Diagnosis not present

## 2015-03-31 DIAGNOSIS — R51 Headache: Secondary | ICD-10-CM | POA: Diagnosis not present

## 2015-03-31 DIAGNOSIS — F319 Bipolar disorder, unspecified: Secondary | ICD-10-CM | POA: Diagnosis not present

## 2015-03-31 DIAGNOSIS — G43909 Migraine, unspecified, not intractable, without status migrainosus: Secondary | ICD-10-CM | POA: Diagnosis not present

## 2015-03-31 DIAGNOSIS — R200 Anesthesia of skin: Secondary | ICD-10-CM | POA: Diagnosis not present

## 2015-03-31 LAB — URINE DRUG SCREEN, QUALITATIVE (ARMC ONLY)
Amphetamines, Ur Screen: NOT DETECTED
BARBITURATES, UR SCREEN: NOT DETECTED
BENZODIAZEPINE, UR SCRN: NOT DETECTED
CANNABINOID 50 NG, UR ~~LOC~~: NOT DETECTED
Cocaine Metabolite,Ur ~~LOC~~: NOT DETECTED
MDMA (Ecstasy)Ur Screen: NOT DETECTED
METHADONE SCREEN, URINE: NOT DETECTED
OPIATE, UR SCREEN: NOT DETECTED
Phencyclidine (PCP) Ur S: NOT DETECTED
Tricyclic, Ur Screen: POSITIVE — AB

## 2015-03-31 NOTE — Progress Notes (Signed)
Pt discharged home per MD order. IV removed. Discharge instructions given. All questions answered. Patient verbalized understanding. Pt left via wheelchair with auxiliary and family.

## 2015-03-31 NOTE — Plan of Care (Signed)
Problem: Discharge Progression Outcomes Goal: Discharge plan in place and appropriate Outcome: Progressing Individualization of Care Pt prefers to be called Carol [name] Hx of CAD, HTN, Bipolar affective disorder, HLD, hypothyroid, MI and CVA.  Admitted for migraine and right side facial numbness.  1.  Discharge Plan:  Patient admitted as code stroke due to right side facial numbness.  She had complaints of migraine but has history of CVA. 2.  Pain:  Patient had complaints of pain 6/10 from headache.  Given Ultram per eMAR.  Pain resolved upon reassessment. 3.  Hemodynamically stable:  Patient afebrile and VSS this shift.  BP 117/51 mmHg  Pulse 62  Temp(Src) 98.1 F (36.7 C) (Oral)  Resp 18  Ht 5' (1.524 m)  Wt 152 lb (68.947 kg)  BMI 29.69 kg/m2  SpO2 99% 4.  Complications:  Neuro checks Q2 hours.  NIH scale resulted as 0.  Stroke/swallow screen was negative in ED. 5.  Diet:  Patient is regular diet and had sandwich tray upon arrival to unit.  She tolerated well. 6.  Activity:  Patient is up independently with stand by assist.  She is steady on her feet, but is a high fall risk due to fall at home.

## 2015-04-03 DIAGNOSIS — I2584 Coronary atherosclerosis due to calcified coronary lesion: Secondary | ICD-10-CM | POA: Diagnosis not present

## 2015-04-03 DIAGNOSIS — I63542 Cerebral infarction due to unspecified occlusion or stenosis of left cerebellar artery: Secondary | ICD-10-CM | POA: Diagnosis not present

## 2015-04-03 DIAGNOSIS — K5900 Constipation, unspecified: Secondary | ICD-10-CM | POA: Diagnosis not present

## 2015-04-03 DIAGNOSIS — R7889 Finding of other specified substances, not normally found in blood: Secondary | ICD-10-CM | POA: Diagnosis not present

## 2015-04-03 DIAGNOSIS — I6522 Occlusion and stenosis of left carotid artery: Secondary | ICD-10-CM | POA: Diagnosis not present

## 2015-04-03 DIAGNOSIS — I69351 Hemiplegia and hemiparesis following cerebral infarction affecting right dominant side: Secondary | ICD-10-CM | POA: Diagnosis not present

## 2015-04-03 DIAGNOSIS — J449 Chronic obstructive pulmonary disease, unspecified: Secondary | ICD-10-CM | POA: Diagnosis not present

## 2015-04-03 DIAGNOSIS — F339 Major depressive disorder, recurrent, unspecified: Secondary | ICD-10-CM | POA: Diagnosis not present

## 2015-04-03 DIAGNOSIS — G43009 Migraine without aura, not intractable, without status migrainosus: Secondary | ICD-10-CM | POA: Diagnosis not present

## 2015-04-03 DIAGNOSIS — I2510 Atherosclerotic heart disease of native coronary artery without angina pectoris: Secondary | ICD-10-CM | POA: Diagnosis not present

## 2015-04-03 DIAGNOSIS — F319 Bipolar disorder, unspecified: Secondary | ICD-10-CM | POA: Diagnosis not present

## 2015-04-03 DIAGNOSIS — D509 Iron deficiency anemia, unspecified: Secondary | ICD-10-CM | POA: Diagnosis not present

## 2015-04-03 NOTE — Discharge Summary (Signed)
DANYETTA GILLHAM, is a 59 y.o. female  DOB [birth year]  MRN [id number].  Admission date:  03/30/2015  Admitting Physician  Baxter Hire, MD  Discharge Date:  03/31/2015   Primary MD  Christie Nottingham., PA  Recommendations for primary care physician for things to follow:     Admission Diagnosis  Migraine without aura and without status migrainosus, not intractable [G43.009] Facial paresthesia [R20.9]   Discharge Diagnosis  Migraine without aura and without status migrainosus, not intractable [G43.009] Facial paresthesia [R20.9]    Active Problems:   Right facial numbness      Past Medical History  Diagnosis Date  . Coronary artery disease   . Hypertension   . Bipolar affective disorder   . Hyperlipidemia   . Hypothyroid   . Myocardial infarction   . Stroke     Past Surgical History  Procedure Laterality Date  . Appendectomy    . Hysterotomy         History of present illness and  Hospital Course:     Kindly see H&P for history of present illness and admission details, please review complete Labs, Consult reports and Test reports for all details in brief  HPI  from the history and physical done on the day of admission  59 year old the female admitted for right facial numbness. Admitted for TIA evaluation.  Hospital Course   #1 right facial numbness patient admitted to telemetry for TIA evaluation. At the head CT did not show any acute stroke MRI of the brain is negative for any new strokes. Patient continued on Plavix and Lipitor. Her numbness resolved the next day. Neurological exam was normal. Thought to have a atypical migraines. Patient discharged home in stable condition. Echocardiogram was done in July and we did not repeat echocardiogram. Carotid ultrasound also was done in July so we did not repeat  the carotid ultrasound. #2 bipolar disorder patient continued on her home medications.  Hypertension continue home medications.    Discharge Condition:stable.   Follow UP  Follow-up Information    Follow up with Christie Nottingham., PA In 1 week.   Specialty:  Physician Assistant   Contact information:   [place] [address] [phone]         Discharge Instructions  and  Discharge Medications       Medication List    TAKE these medications        amitriptyline 50 MG tablet  Commonly known as:  ELAVIL  Take 50 mg by mouth at bedtime.     budesonide-formoterol 160-4.5 MCG/ACT inhaler  Commonly known as:  SYMBICORT  Inhale 1 puff into the lungs 2 (two) times daily.     cetirizine 10 MG tablet  Commonly known as:  ZYRTEC  Take 10 mg by mouth daily.     citalopram 40 MG tablet  Commonly known as:  CELEXA  Take 40 mg by mouth daily.     clopidogrel 75 MG tablet  Commonly known as:  PLAVIX  Take 75 mg by mouth daily.     ferrous sulfate 325 (65 FE) MG tablet  Take 325 mg by mouth every 7 (seven) days.     levothyroxine 100 MCG tablet  Commonly known as:  SYNTHROID, LEVOTHROID  Take 100 mcg by mouth daily.     lisinopril 5 MG tablet  Commonly known as:  PRINIVIL,ZESTRIL  Take 5 mg by mouth daily.     lithium carbonate 300 MG capsule  Take 300-600 mg by mouth 2 (two) times daily. Pt takes one capsule in the morning and two at bedtime.     lubiprostone 8 MCG capsule  Commonly known as:  AMITIZA  Take 8 mcg by mouth 2 (two) times daily as needed for constipation.     metoprolol tartrate 25 MG tablet  Commonly known as:  LOPRESSOR  Take 25 mg by mouth 2 (two) times daily.     simvastatin 20 MG tablet  Commonly known as:  ZOCOR  Take 20 mg by mouth at bedtime.     traMADol 50 MG tablet  Commonly known as:  ULTRAM  Take 1 tablet (50 mg total) by mouth every 6 (six) hours as needed.          Diet and Activity recommendation: See  Discharge Instructions above   Consults obtained - none   Major procedures and Radiology Reports - PLEASE review detailed and final reports for all details, in brief -      Ct Head Wo Contrast  03/30/2015   CLINICAL DATA:  Acute onset of right facial numbness and headache. Initial encounter.  EXAM: CT HEAD WITHOUT CONTRAST  TECHNIQUE: Contiguous axial images were obtained from the base of the skull through the vertex without intravenous contrast.  COMPARISON:  CT of the head and MRI of the brain performed 02/26/2015  FINDINGS: There is no evidence of acute infarction, mass lesion, or intra- or extra-axial hemorrhage on CT.  Mild periventricular white matter change likely reflects small vessel ischemic microangiopathy. A small chronic lacunar infarct is noted at the left caudate.  The posterior fossa, including the cerebellum, brainstem and fourth ventricle, is within normal limits. The third and lateral ventricles are unremarkable in appearance. The cerebral hemispheres are symmetric in appearance, with normal gray-white differentiation. No mass effect or midline shift is seen.  There is no evidence of fracture; visualized osseous structures are unremarkable in appearance. The visualized portions of the orbits are within normal limits. The paranasal sinuses and mastoid air cells are well-aerated. No significant soft tissue abnormalities are seen.  IMPRESSION: 1. No acute intracranial pathology seen on CT. 2. Mild small vessel ischemic microangiopathy and small chronic lacunar infarct at the left caudate. These results were called by telephone at the time of interpretation on 03/30/2015 at 8:20 pm to Dr. Delman Kitten, who verbally acknowledged these results.   Electronically Signed   By: Garald Balding M.D.   On: 03/30/2015 20:22   Mr Brain Wo Contrast  03/31/2015   CLINICAL DATA:  Migraine headache with vomiting and photophobia beginning 12 hours ago.  EXAM: MRI HEAD WITHOUT CONTRAST  TECHNIQUE:  Multiplanar, multiecho pulse sequences of the brain and surrounding structures were obtained without intravenous contrast.  COMPARISON:  02/26/2015  FINDINGS: Diffusion imaging does not show any acute or subacute infarction. The brainstem is normal. There are old small vessel infarctions affecting the left cerebellum. There are old small vessel infarctions affecting the cerebral hemispheric white matter in the left caudate. No cortical or large vessel territory infarction. No mass lesion, hemorrhage, hydrocephalus or extra-axial collection. No pituitary mass. No inflammatory sinus disease. No skull or skullbase lesion.  IMPRESSION: No change since the previous study. No acute finding. Old small vessel infarctions affecting the left cerebellum, the cerebral hemispheric white matter in the left caudate.   Electronically Signed   By: Nelson Chimes M.D.   On: 03/31/2015 10:59    Micro Results     No results found for  this or any previous visit (from the past 240 hour(s)).     Today   Subjective:   Raschelle Wisenbaker today has no headache,no chest abdominal pain,no new weakness tingling or numbness, feels much better wants to go home today.   Objective:   Blood pressure 131/50, pulse 63, temperature 97.6 F (36.4 C), temperature source Oral, resp. rate 20, height 5' (1.524 m), weight 68.947 kg (152 lb), SpO2 97 %.  No intake or output data in the 24 hours ending 04/03/15 1335  Exam Awake Alert, Oriented x 3, No new F.N deficits, Normal affect [place].AT,PERRAL Supple Neck,No JVD, No cervical lymphadenopathy appriciated.  Symmetrical Chest wall movement, Good air movement bilaterally, CTAB RRR,No Gallops,Rubs or new Murmurs, No Parasternal Heave +ve B.Sounds, Abd Soft, Non tender, No organomegaly appriciated, No rebound -guarding or rigidity. No Cyanosis, Clubbing or edema, No new Rash or bruise  Data Review   CBC w Diff:  Lab Results  Component Value Date   WBC 6.9 03/30/2015   WBC 8.8  04/24/2014   HGB 11.5* 03/30/2015   HGB 10.8* 04/24/2014   HCT 34.3* 03/30/2015   HCT 33.9* 04/24/2014   PLT 193 03/30/2015   PLT 207 04/24/2014   LYMPHOPCT 17 02/26/2015   LYMPHOPCT 14.4 04/24/2014   MONOPCT 7 02/26/2015   MONOPCT 4.9 04/24/2014   EOSPCT 3 02/26/2015   EOSPCT 4.6 04/24/2014   BASOPCT 1 02/26/2015   BASOPCT 0.9 04/24/2014    CMP:  Lab Results  Component Value Date   NA 139 03/30/2015   NA 140 03/15/2014   K 3.6 03/30/2015   K 4.0 03/15/2014   CL 107 03/30/2015   CL 109* 03/15/2014   CO2 24 03/30/2015   CO2 29 03/15/2014   BUN 20 03/30/2015   BUN 8 03/15/2014   CREATININE 0.97 03/30/2015   CREATININE 1.01 03/15/2014   PROT 7.3 02/26/2015   PROT 7.0 03/15/2014   ALBUMIN 4.3 02/26/2015   ALBUMIN 3.4 03/15/2014   BILITOT 0.5 02/26/2015   BILITOT 0.6 03/15/2014   ALKPHOS 148* 02/26/2015   ALKPHOS 150* 03/15/2014   AST 18 02/26/2015   AST 21 03/15/2014   ALT 14 02/26/2015   ALT 20 03/15/2014  .   Total Time in preparing paper work, data evaluation and todays exam - 52 minutes  Joandry Slagter M.D on 03/31/2015 at 1:35 PM

## 2015-04-08 DIAGNOSIS — D509 Iron deficiency anemia, unspecified: Secondary | ICD-10-CM | POA: Diagnosis not present

## 2015-04-08 DIAGNOSIS — I2510 Atherosclerotic heart disease of native coronary artery without angina pectoris: Secondary | ICD-10-CM | POA: Diagnosis not present

## 2015-04-08 DIAGNOSIS — I69351 Hemiplegia and hemiparesis following cerebral infarction affecting right dominant side: Secondary | ICD-10-CM | POA: Diagnosis not present

## 2015-04-08 DIAGNOSIS — J449 Chronic obstructive pulmonary disease, unspecified: Secondary | ICD-10-CM | POA: Diagnosis not present

## 2015-04-08 DIAGNOSIS — I2584 Coronary atherosclerosis due to calcified coronary lesion: Secondary | ICD-10-CM | POA: Diagnosis not present

## 2015-04-08 DIAGNOSIS — F319 Bipolar disorder, unspecified: Secondary | ICD-10-CM | POA: Diagnosis not present

## 2015-04-09 ENCOUNTER — Emergency Department
Admission: EM | Admit: 2015-04-09 | Discharge: 2015-04-09 | Disposition: A | Discharge status: Home / Self Care | Payer: Medicare Other | Attending: Emergency Medicine | Admitting: Emergency Medicine

## 2015-04-09 ENCOUNTER — Emergency Department: Payer: Medicare Other

## 2015-04-09 ENCOUNTER — Other Ambulatory Visit
Admission: RE | Admit: 2015-04-09 | Discharge: 2015-04-09 | Disposition: A | Discharge status: Home / Self Care | Payer: Medicare Other | Source: Ambulatory Visit | Attending: Physician Assistant | Admitting: Physician Assistant

## 2015-04-09 DIAGNOSIS — R930 Abnormal findings on diagnostic imaging of skull and head, not elsewhere classified: Secondary | ICD-10-CM | POA: Diagnosis not present

## 2015-04-09 DIAGNOSIS — Z79899 Other long term (current) drug therapy: Secondary | ICD-10-CM | POA: Diagnosis not present

## 2015-04-09 DIAGNOSIS — Z720 Tobacco use: Secondary | ICD-10-CM | POA: Diagnosis not present

## 2015-04-09 DIAGNOSIS — G43809 Other migraine, not intractable, without status migrainosus: Secondary | ICD-10-CM | POA: Insufficient documentation

## 2015-04-09 DIAGNOSIS — Z7902 Long term (current) use of antithrombotics/antiplatelets: Secondary | ICD-10-CM | POA: Insufficient documentation

## 2015-04-09 DIAGNOSIS — R51 Headache: Secondary | ICD-10-CM | POA: Diagnosis present

## 2015-04-09 DIAGNOSIS — Z7951 Long term (current) use of inhaled steroids: Secondary | ICD-10-CM | POA: Insufficient documentation

## 2015-04-09 DIAGNOSIS — I10 Essential (primary) hypertension: Secondary | ICD-10-CM | POA: Insufficient documentation

## 2015-04-09 LAB — COMPREHENSIVE METABOLIC PANEL
ALBUMIN: 4.2 g/dL (ref 3.5–5.0)
ALK PHOS: 108 U/L (ref 38–126)
ALK PHOS: 103 U/L (ref 38–126)
ALT: 16 U/L (ref 14–54)
ALT: 16 U/L (ref 14–54)
ANION GAP: 9 (ref 5–15)
AST: 23 U/L (ref 15–41)
AST: 21 U/L (ref 15–41)
Albumin: 4 g/dL (ref 3.5–5.0)
Anion gap: 8 (ref 5–15)
BILIRUBIN TOTAL: 0.2 mg/dL — AB (ref 0.3–1.2)
BUN: 19 mg/dL (ref 6–20)
BUN: 18 mg/dL (ref 6–20)
CALCIUM: 9.5 mg/dL (ref 8.9–10.3)
CALCIUM: 9.2 mg/dL (ref 8.9–10.3)
CO2: 24 mmol/L (ref 22–32)
CO2: 24 mmol/L (ref 22–32)
CREATININE: 1.06 mg/dL — AB (ref 0.44–1.00)
Chloride: 107 mmol/L (ref 101–111)
Chloride: 107 mmol/L (ref 101–111)
Creatinine, Ser: 1.05 mg/dL — ABNORMAL HIGH (ref 0.44–1.00)
GFR calc non Af Amer: 55 mL/min — ABNORMAL LOW (ref >60)
GFR calc non Af Amer: 56 mL/min — ABNORMAL LOW (ref >60)
GLUCOSE: 110 mg/dL — AB (ref 65–99)
GLUCOSE: 97 mg/dL (ref 65–99)
Potassium: 4.2 mmol/L (ref 3.5–5.1)
Potassium: 3.7 mmol/L (ref 3.5–5.1)
SODIUM: 139 mmol/L (ref 135–145)
Sodium: 140 mmol/L (ref 135–145)
TOTAL PROTEIN: 7 g/dL (ref 6.5–8.1)
Total Bilirubin: 0.5 mg/dL (ref 0.3–1.2)
Total Protein: 7.5 g/dL (ref 6.5–8.1)

## 2015-04-09 LAB — LIPID PANEL
Cholesterol: 234 mg/dL — ABNORMAL HIGH (ref 0–200)
HDL: 45 mg/dL (ref >40)
LDL Cholesterol: 143 mg/dL — ABNORMAL HIGH (ref 0–99)
Total CHOL/HDL Ratio: 5.2 RATIO
Triglycerides: 228 mg/dL — ABNORMAL HIGH (ref <150)
VLDL: 46 mg/dL — ABNORMAL HIGH (ref 0–40)

## 2015-04-09 LAB — CBC WITH DIFFERENTIAL/PLATELET
Basophils Absolute: 0 10*3/uL (ref 0–0.1)
Basophils Absolute: 0 10*3/uL (ref 0–0.1)
Basophils Relative: 1 %
Basophils Relative: 0 %
Eosinophils Absolute: 0.1 10*3/uL (ref 0–0.7)
Eosinophils Absolute: 0.1 10*3/uL (ref 0–0.7)
Eosinophils Relative: 1 %
Eosinophils Relative: 2 %
HCT: 34.2 % — ABNORMAL LOW (ref 35.0–47.0)
HEMATOCRIT: 33 % — AB (ref 35.0–47.0)
HEMOGLOBIN: 10.9 g/dL — AB (ref 12.0–16.0)
Hemoglobin: 11.4 g/dL — ABNORMAL LOW (ref 12.0–16.0)
LYMPHS ABS: 1.6 10*3/uL (ref 1.0–3.6)
Lymphocytes Relative: 24 %
Lymphocytes Relative: 26 %
Lymphs Abs: 1.4 10*3/uL (ref 1.0–3.6)
MCH: 31.6 pg (ref 26.0–34.0)
MCH: 31.6 pg (ref 26.0–34.0)
MCHC: 33.3 g/dL (ref 32.0–36.0)
MCHC: 33 g/dL (ref 32.0–36.0)
MCV: 94.8 fL (ref 80.0–100.0)
MCV: 95.7 fL (ref 80.0–100.0)
MONOS PCT: 7 %
Monocytes Absolute: 0.4 10*3/uL (ref 0.2–0.9)
Monocytes Absolute: 0.5 10*3/uL (ref 0.2–0.9)
Monocytes Relative: 8 %
NEUTROS ABS: 4.1 10*3/uL (ref 1.4–6.5)
NEUTROS PCT: 65 %
Neutro Abs: 3.9 10*3/uL (ref 1.4–6.5)
Neutrophils Relative %: 66 %
Platelets: 190 10*3/uL (ref 150–440)
Platelets: 190 10*3/uL (ref 150–440)
RBC: 3.61 MIL/uL — ABNORMAL LOW (ref 3.80–5.20)
RBC: 3.45 MIL/uL — ABNORMAL LOW (ref 3.80–5.20)
RDW: 13.5 % (ref 11.5–14.5)
RDW: 13.6 % (ref 11.5–14.5)
WBC: 5.9 10*3/uL (ref 3.6–11.0)
WBC: 6.4 10*3/uL (ref 3.6–11.0)

## 2015-04-09 LAB — T4, FREE: Free T4: 0.51 ng/dL — ABNORMAL LOW (ref 0.61–1.12)

## 2015-04-09 LAB — IRON AND TIBC
IRON: 66 ug/dL (ref 28–170)
Saturation Ratios: 19 % (ref 10.4–31.8)
TIBC: 356 ug/dL (ref 250–450)
UIBC: 290 ug/dL

## 2015-04-09 LAB — FERRITIN: Ferritin: 29 ng/mL (ref 11–307)

## 2015-04-09 LAB — APTT: aPTT: 27 seconds (ref 24–36)

## 2015-04-09 LAB — TSH: TSH: 0.399 u[IU]/mL (ref 0.350–4.500)

## 2015-04-09 MED ORDER — METOCLOPRAMIDE HCL 5 MG/ML IJ SOLN
10.0000 mg | Freq: Once | INTRAMUSCULAR | Status: AC
  Administered 2015-04-09: 10 mg via INTRAVENOUS
  Filled 2015-04-09: qty 2

## 2015-04-09 MED ORDER — MAGNESIUM SULFATE 2 GM/50ML IV SOLN
2.0000 g | Freq: Once | INTRAVENOUS | Status: AC
Start: 2015-04-09 — End: 2015-04-09
  Administered 2015-04-09: 2 g via INTRAVENOUS
  Filled 2015-04-09: qty 50

## 2015-04-09 MED ORDER — ONDANSETRON HCL 4 MG/2ML IJ SOLN
4.0000 mg | Freq: Once | INTRAMUSCULAR | Status: AC
  Administered 2015-04-09: 4 mg via INTRAVENOUS
  Filled 2015-04-09: qty 2

## 2015-04-09 MED ORDER — HYDROMORPHONE HCL 1 MG/ML IJ SOLN
0.5000 mg | INTRAMUSCULAR | Status: DC | PRN
  Administered 2015-04-09: 0.5 mg via INTRAVENOUS

## 2015-04-09 MED ORDER — HYDROMORPHONE HCL 1 MG/ML IJ SOLN
INTRAMUSCULAR | Status: AC
  Filled 2015-04-09: qty 1

## 2015-04-09 NOTE — Discharge Instructions (Signed)
You had a complex migraine this evening. This is a similar problem as which you have a week and a half ago. It resolved with Reglan and magnesium. Follow-up with your regular doctor and with Dr. Brigitte Pulse. Return to the emergency department if you have further neurologic symptoms or other urgent concerns.  Migraine Headache A migraine headache is very bad, throbbing pain on one or both sides of your head. Talk to your doctor about what things may bring on (trigger) your migraine headaches. HOME CARE  Only take medicines as told by your doctor.  Lie down in a dark, quiet room when you have a migraine.  Keep a journal to find out if certain things bring on migraine headaches. For example, write down:  What you eat and drink.  How much sleep you get.  Any change to your diet or medicines.  Lessen how much alcohol you drink.  Quit smoking if you smoke.  Get enough sleep.  Lessen any stress in your life.  Keep lights dim if bright lights bother you or make your migraines worse. GET HELP RIGHT AWAY IF:   Your migraine becomes really bad.  You have a fever.  You have a stiff neck.  You have trouble seeing.  Your muscles are weak, or you lose muscle control.  You lose your balance or have trouble walking.  You feel like you will pass out (faint), or you pass out.  You have really bad symptoms that are different than your first symptoms. MAKE SURE YOU:   Understand these instructions.  Will watch your condition.  Will get help right away if you are not doing well or get worse. Document Released: 05/11/2008 Document Revised: 10/25/2011 Document Reviewed: 04/09/2013 [facility] Patient Information 2015 [place], [place]. This information is not intended to replace advice given to you by your health care provider. Make sure you discuss any questions you have with your health care provider.

## 2015-04-09 NOTE — ED Notes (Signed)
Pt arrives to ED w/ c/o headache.  Pt sts feeling numbness on R side. Pt has hx of stroke.

## 2015-04-09 NOTE — ED Provider Notes (Signed)
[facility] Emergency Department Provider Note  ____________________________________________  Time seen: Approximately 1705  I have reviewed the triage vital signs and the nursing notes.   HISTORY  Chief Complaint Headache  paresthesia, numbness, and right side    HPI Carol [name] is a 59 y.o. female with a history of prior strokes noted on MRI. She is also had some right-sided weakness in the past and required physical therapy, which she and the family member present reports was due to the "TIAs" noted on the MRI.  Today, at approximate 3:00, she had acute onset headache with numbness in her right arm and right leg. That numbness continues currently. She denies any weakness and she has no acute change in her face according to both her and her son present.  She also complains of tightness in her right neck.  She is not having any slurred speech. She does have what appears to be an odd dyskinetic movement with her face.  She denies any chest pain, shortness of breath, or nausea.   Past Medical History  Diagnosis Date  . Coronary artery disease   . Hypertension   . Bipolar affective disorder   . Hyperlipidemia   . Hypothyroid   . Myocardial infarction   . Stroke     Patient Active Problem List   Diagnosis Date Noted  . Right facial numbness 03/30/2015  . Lithium intoxication 02/27/2015  . Bipolar disorder 02/27/2015  . CVA (cerebral infarction) 02/26/2015  . Unstable angina 02/03/2015    Past Surgical History  Procedure Laterality Date  . Appendectomy    . Hysterotomy      Current Outpatient Rx  Name  Route  Sig  Dispense  Refill  . amitriptyline (ELAVIL) 50 MG tablet   Oral   Take 50 mg by mouth at bedtime.         . budesonide-formoterol (SYMBICORT) 160-4.5 MCG/ACT inhaler   Inhalation   Inhale 1 puff into the lungs 2 (two) times daily.         . cetirizine (ZYRTEC) 10 MG tablet   Oral   Take 10 mg by mouth daily.          . citalopram (CELEXA) 40 MG tablet   Oral   Take 40 mg by mouth daily.         . clopidogrel (PLAVIX) 75 MG tablet   Oral   Take 75 mg by mouth daily.         . ferrous sulfate 325 (65 FE) MG tablet   Oral   Take 325 mg by mouth every 7 (seven) days.         Marland Kitchen levothyroxine (SYNTHROID, LEVOTHROID) 100 MCG tablet   Oral   Take 100 mcg by mouth daily.         Marland Kitchen lisinopril (PRINIVIL,ZESTRIL) 5 MG tablet   Oral   Take 5 mg by mouth daily.         Marland Kitchen lithium carbonate 300 MG capsule   Oral   Take 300-600 mg by mouth 2 (two) times daily. Pt takes one capsule in the morning and two at bedtime.         Marland Kitchen lubiprostone (AMITIZA) 8 MCG capsule   Oral   Take 8 mcg by mouth 2 (two) times daily as needed for constipation.         . metoprolol tartrate (LOPRESSOR) 25 MG tablet   Oral   Take 25 mg by mouth 2 (two) times daily.         Marland Kitchen  simvastatin (ZOCOR) 20 MG tablet   Oral   Take 20 mg by mouth at bedtime.         . traMADol (ULTRAM) 50 MG tablet   Oral   Take 1 tablet (50 mg total) by mouth every 6 (six) hours as needed. Patient not taking: Reported on 03/30/2015   10 tablet   0     Allergies Morphine; Pregabalin; Rofecoxib; Sulfamethoxazole-trimethoprim; and Asa  Family History  Problem Relation Age of Onset  . CAD      Social History Social History  Substance Use Topics  . Smoking status: Current Every Day Smoker -- 1.00 packs/day for 40 years  . Smokeless tobacco: Never Used  . Alcohol Use: No    Review of Systems  Constitutional: Negative for fever. ENT: Negative for sore throat. Cardiovascular: Negative for chest pain. Respiratory: Negative for shortness of breath. Gastrointestinal: Negative for abdominal pain, vomiting and diarrhea. Genitourinary: Negative for dysuria. Musculoskeletal: No myalgias or injuries. Skin: Negative for rash. Neurological: Positive for headache with right-sided paresthesia.  10-point ROS otherwise  negative.  ____________________________________________   PHYSICAL EXAM:  VITAL SIGNS: ED Triage Vitals  Enc Vitals Group     BP --      Pulse Rate 04/09/15 1715 57     Resp 04/09/15 1715 19     Temp --      Temp src --      SpO2 04/09/15 1715 95 %     Weight --      Height --      Head Cir --      Peak Flow --      Pain Score 04/09/15 1713 10     Pain Loc --      Pain Edu? --      Excl. in [place]? --     Constitutional: Alert, communicative. Well appearing and in no distress. ENT   Head: Normocephalic and atraumatic.   Nose: No congestion/rhinnorhea.   Mouth/Throat: Mucous membranes are moist. Cardiovascular: Normal rate, regular rhythm, no murmur noted Respiratory:  Normal respiratory effort, no tachypnea.    Breath sounds are clear and equal bilaterally.  Gastrointestinal: Soft and nontender. No distention.  Back: No muscle spasm, no tenderness, no CVA tenderness. Musculoskeletal: No deformity noted. Nontender with normal range of motion in all extremities.  No noted edema. Neurologic: Patient does have a dyskinetic movement of her mouth. When asked to smile or open her mouth, all movements are symmetrical.  Normal speech and language. The patient has 5 over 5 strength in all 4 extremities. Her sensation appears to be diminished in the right leg and right arm.  Skin:  Skin is warm, dry. No rash noted. Psychiatric: Mood and affect are normal. Speech and behavior are normal.  ____________________________________________    LABS (pertinent positives/negatives)  Labs Reviewed  CBC WITH DIFFERENTIAL/PLATELET - Abnormal; Notable for the following:    RBC 3.45 (*)    Hemoglobin 10.9 (*)    HCT 33.0 (*)    All other components within normal limits  COMPREHENSIVE METABOLIC PANEL - Abnormal; Notable for the following:    Creatinine, Ser 1.05 (*)    Total Bilirubin 0.2 (*)    GFR calc non Af Amer 56 (*)    All other components within normal limits  APTT   URINALYSIS COMPLETEWITH MICROSCOPIC ([facility] ONLY)  CBG MONITORING, ED     ____________________________________________   EKG  ED ECG REPORT I, Canaan Holzer W, the attending physician, personally viewed and  interpreted this ECG.   Date: 04/09/2015  EKG Time: 1721  Rate: 56  Rhythm: Sinus bradycardia  Axis: Normal  Intervals: Normal  ST&T Change: None noted   ____________________________________________    RADIOLOGY  CT head:  IMPRESSION: 1. No acute intracranial abnormalities. 2. Small vessel ischemic change.  ____________________________________________   INITIAL IMPRESSION / ASSESSMENT AND PLAN / ED COURSE  Pertinent labs & imaging results that were available during my care of the patient were reviewed by me and considered in my medical decision making (see chart for details).  Headache and acute right-sided paresthesia with loss of sensation on exam. CT scan is pending.  The patient does not have any motor dysfunction and her loss of sensation is not complete. She has a low NIH score of 2.  ----------------------------------------- 6:25 PM on 04/09/2015 -----------------------------------------  CT scan result shows no acute change. Labs are pending.  ----------------------------------------- 7:26 PM on 04/09/2015 -----------------------------------------  Labs have not resulted yet. I have called the lab and am awaiting their response in terms of the lab process.  ----------------------------------------- 7:45 PM on 04/09/2015 -----------------------------------------  Reviewing the records, I now see the patient presents to the emergency department approximately a week and a half ago with right-sided facial numbness. She was admitted to the hospital for an MRI that showed no new additional strokes. It was determined that she was having migraine activity. I will treat her with Reglan and magnesium currently and see how she does after  that.  ----------------------------------------- 9:48 PM on 04/09/2015 -----------------------------------------  Patient feels back to normal now after treatment with Reglan and magnesium. I believe that her symptoms tonight were part of a complex migraine. We will discharge her home. She has a follow-up appointment with Dr. Brigitte Pulse scheduled. ____________________________________________   FINAL CLINICAL IMPRESSION(S) / ED DIAGNOSES  Final diagnoses:  Other migraine without status migrainosus, not intractable   right-sided paresthesia due to complex migraine    Ahmed Prima, MD 04/09/15 2151

## 2015-04-10 LAB — T4: T4 TOTAL: 4.1 ug/dL — AB (ref 4.5–12.0)

## 2015-04-14 DIAGNOSIS — I2584 Coronary atherosclerosis due to calcified coronary lesion: Secondary | ICD-10-CM | POA: Diagnosis not present

## 2015-04-14 DIAGNOSIS — I69351 Hemiplegia and hemiparesis following cerebral infarction affecting right dominant side: Secondary | ICD-10-CM | POA: Diagnosis not present

## 2015-04-14 DIAGNOSIS — D509 Iron deficiency anemia, unspecified: Secondary | ICD-10-CM | POA: Diagnosis not present

## 2015-04-14 DIAGNOSIS — F319 Bipolar disorder, unspecified: Secondary | ICD-10-CM | POA: Diagnosis not present

## 2015-04-14 DIAGNOSIS — I2510 Atherosclerotic heart disease of native coronary artery without angina pectoris: Secondary | ICD-10-CM | POA: Diagnosis not present

## 2015-04-14 DIAGNOSIS — J449 Chronic obstructive pulmonary disease, unspecified: Secondary | ICD-10-CM | POA: Diagnosis not present

## 2015-04-17 DIAGNOSIS — I2510 Atherosclerotic heart disease of native coronary artery without angina pectoris: Secondary | ICD-10-CM | POA: Diagnosis not present

## 2015-04-17 DIAGNOSIS — F319 Bipolar disorder, unspecified: Secondary | ICD-10-CM | POA: Diagnosis not present

## 2015-04-17 DIAGNOSIS — I2584 Coronary atherosclerosis due to calcified coronary lesion: Secondary | ICD-10-CM | POA: Diagnosis not present

## 2015-04-17 DIAGNOSIS — I69351 Hemiplegia and hemiparesis following cerebral infarction affecting right dominant side: Secondary | ICD-10-CM | POA: Diagnosis not present

## 2015-04-17 DIAGNOSIS — J449 Chronic obstructive pulmonary disease, unspecified: Secondary | ICD-10-CM | POA: Diagnosis not present

## 2015-04-17 DIAGNOSIS — D509 Iron deficiency anemia, unspecified: Secondary | ICD-10-CM | POA: Diagnosis not present

## 2015-04-28 DIAGNOSIS — Z886 Allergy status to analgesic agent status: Secondary | ICD-10-CM | POA: Diagnosis not present

## 2015-04-28 DIAGNOSIS — F410 Panic disorder [episodic paroxysmal anxiety] without agoraphobia: Secondary | ICD-10-CM | POA: Diagnosis not present

## 2015-04-28 DIAGNOSIS — M5442 Lumbago with sciatica, left side: Secondary | ICD-10-CM | POA: Diagnosis not present

## 2015-04-28 DIAGNOSIS — Z8673 Personal history of transient ischemic attack (TIA), and cerebral infarction without residual deficits: Secondary | ICD-10-CM | POA: Diagnosis not present

## 2015-04-28 DIAGNOSIS — M797 Fibromyalgia: Secondary | ICD-10-CM | POA: Diagnosis not present

## 2015-04-28 DIAGNOSIS — Z885 Allergy status to narcotic agent status: Secondary | ICD-10-CM | POA: Diagnosis not present

## 2015-04-28 DIAGNOSIS — F17210 Nicotine dependence, cigarettes, uncomplicated: Secondary | ICD-10-CM | POA: Diagnosis not present

## 2015-04-28 DIAGNOSIS — M1990 Unspecified osteoarthritis, unspecified site: Secondary | ICD-10-CM | POA: Diagnosis not present

## 2015-04-28 DIAGNOSIS — Z8249 Family history of ischemic heart disease and other diseases of the circulatory system: Secondary | ICD-10-CM | POA: Diagnosis not present

## 2015-04-28 DIAGNOSIS — Z8269 Family history of other diseases of the musculoskeletal system and connective tissue: Secondary | ICD-10-CM | POA: Diagnosis not present

## 2015-04-28 DIAGNOSIS — H9190 Unspecified hearing loss, unspecified ear: Secondary | ICD-10-CM | POA: Diagnosis not present

## 2015-04-28 DIAGNOSIS — Z79899 Other long term (current) drug therapy: Secondary | ICD-10-CM | POA: Diagnosis not present

## 2015-04-28 DIAGNOSIS — F4310 Post-traumatic stress disorder, unspecified: Secondary | ICD-10-CM | POA: Diagnosis not present

## 2015-04-28 DIAGNOSIS — F319 Bipolar disorder, unspecified: Secondary | ICD-10-CM | POA: Diagnosis not present

## 2015-04-28 DIAGNOSIS — E039 Hypothyroidism, unspecified: Secondary | ICD-10-CM | POA: Diagnosis not present

## 2015-04-28 DIAGNOSIS — I252 Old myocardial infarction: Secondary | ICD-10-CM | POA: Diagnosis not present

## 2015-04-28 DIAGNOSIS — I10 Essential (primary) hypertension: Secondary | ICD-10-CM | POA: Diagnosis not present

## 2015-04-28 DIAGNOSIS — Z90710 Acquired absence of both cervix and uterus: Secondary | ICD-10-CM | POA: Diagnosis not present

## 2015-04-28 DIAGNOSIS — E785 Hyperlipidemia, unspecified: Secondary | ICD-10-CM | POA: Diagnosis not present

## 2015-05-07 DIAGNOSIS — I6931 Cognitive deficits following cerebral infarction: Secondary | ICD-10-CM | POA: Diagnosis not present

## 2015-05-12 ENCOUNTER — Emergency Department: Payer: Medicare Other

## 2015-05-12 ENCOUNTER — Observation Stay
Admission: EM | Admit: 2015-05-12 | Discharge: 2015-05-13 | Disposition: A | Discharge status: Home / Self Care | Payer: Medicare Other | Attending: Internal Medicine | Admitting: Internal Medicine

## 2015-05-12 ENCOUNTER — Encounter: Payer: Self-pay | Admitting: Emergency Medicine

## 2015-05-12 DIAGNOSIS — J441 Chronic obstructive pulmonary disease with (acute) exacerbation: Secondary | ICD-10-CM | POA: Diagnosis not present

## 2015-05-12 DIAGNOSIS — Z7951 Long term (current) use of inhaled steroids: Secondary | ICD-10-CM | POA: Insufficient documentation

## 2015-05-12 DIAGNOSIS — F17200 Nicotine dependence, unspecified, uncomplicated: Secondary | ICD-10-CM | POA: Insufficient documentation

## 2015-05-12 DIAGNOSIS — I252 Old myocardial infarction: Secondary | ICD-10-CM | POA: Insufficient documentation

## 2015-05-12 DIAGNOSIS — F319 Bipolar disorder, unspecified: Secondary | ICD-10-CM | POA: Insufficient documentation

## 2015-05-12 DIAGNOSIS — M797 Fibromyalgia: Secondary | ICD-10-CM | POA: Diagnosis not present

## 2015-05-12 DIAGNOSIS — G894 Chronic pain syndrome: Secondary | ICD-10-CM | POA: Diagnosis not present

## 2015-05-12 DIAGNOSIS — Z8249 Family history of ischemic heart disease and other diseases of the circulatory system: Secondary | ICD-10-CM | POA: Insufficient documentation

## 2015-05-12 DIAGNOSIS — R0789 Other chest pain: Secondary | ICD-10-CM | POA: Diagnosis not present

## 2015-05-12 DIAGNOSIS — R0602 Shortness of breath: Secondary | ICD-10-CM | POA: Insufficient documentation

## 2015-05-12 DIAGNOSIS — Z885 Allergy status to narcotic agent status: Secondary | ICD-10-CM | POA: Diagnosis not present

## 2015-05-12 DIAGNOSIS — I2510 Atherosclerotic heart disease of native coronary artery without angina pectoris: Secondary | ICD-10-CM | POA: Diagnosis not present

## 2015-05-12 DIAGNOSIS — E039 Hypothyroidism, unspecified: Secondary | ICD-10-CM | POA: Insufficient documentation

## 2015-05-12 DIAGNOSIS — Z7289 Other problems related to lifestyle: Secondary | ICD-10-CM | POA: Insufficient documentation

## 2015-05-12 DIAGNOSIS — Z23 Encounter for immunization: Secondary | ICD-10-CM | POA: Diagnosis not present

## 2015-05-12 DIAGNOSIS — Z79899 Other long term (current) drug therapy: Secondary | ICD-10-CM | POA: Diagnosis not present

## 2015-05-12 DIAGNOSIS — Z882 Allergy status to sulfonamides status: Secondary | ICD-10-CM | POA: Insufficient documentation

## 2015-05-12 DIAGNOSIS — E785 Hyperlipidemia, unspecified: Secondary | ICD-10-CM | POA: Insufficient documentation

## 2015-05-12 DIAGNOSIS — Z8673 Personal history of transient ischemic attack (TIA), and cerebral infarction without residual deficits: Secondary | ICD-10-CM | POA: Diagnosis not present

## 2015-05-12 DIAGNOSIS — R079 Chest pain, unspecified: Secondary | ICD-10-CM | POA: Diagnosis not present

## 2015-05-12 DIAGNOSIS — R109 Unspecified abdominal pain: Secondary | ICD-10-CM | POA: Diagnosis not present

## 2015-05-12 DIAGNOSIS — I10 Essential (primary) hypertension: Secondary | ICD-10-CM | POA: Insufficient documentation

## 2015-05-12 DIAGNOSIS — Z886 Allergy status to analgesic agent status: Secondary | ICD-10-CM | POA: Insufficient documentation

## 2015-05-12 LAB — BASIC METABOLIC PANEL
Anion gap: 8 (ref 5–15)
BUN: 17 mg/dL (ref 6–20)
CALCIUM: 9.9 mg/dL (ref 8.9–10.3)
CO2: 24 mmol/L (ref 22–32)
CREATININE: 0.88 mg/dL (ref 0.44–1.00)
Chloride: 106 mmol/L (ref 101–111)
GFR calc Af Amer: >60 mL/min (ref >60)
GLUCOSE: 78 mg/dL (ref 65–99)
Potassium: 4.2 mmol/L (ref 3.5–5.1)
Sodium: 138 mmol/L (ref 135–145)

## 2015-05-12 LAB — CBC
HCT: 32.9 % — ABNORMAL LOW (ref 35.0–47.0)
HEMATOCRIT: 36.4 % (ref 35.0–47.0)
Hemoglobin: 10.9 g/dL — ABNORMAL LOW (ref 12.0–16.0)
Hemoglobin: 12.1 g/dL (ref 12.0–16.0)
MCH: 31.5 pg (ref 26.0–34.0)
MCH: 31.5 pg (ref 26.0–34.0)
MCHC: 33 g/dL (ref 32.0–36.0)
MCHC: 33.3 g/dL (ref 32.0–36.0)
MCV: 95.5 fL (ref 80.0–100.0)
MCV: 94.8 fL (ref 80.0–100.0)
PLATELETS: 180 10*3/uL (ref 150–440)
Platelets: 207 10*3/uL (ref 150–440)
RBC: 3.45 MIL/uL — ABNORMAL LOW (ref 3.80–5.20)
RBC: 3.84 MIL/uL (ref 3.80–5.20)
RDW: 13.4 % (ref 11.5–14.5)
RDW: 13.3 % (ref 11.5–14.5)
WBC: 5.6 10*3/uL (ref 3.6–11.0)
WBC: 7.4 10*3/uL (ref 3.6–11.0)

## 2015-05-12 LAB — CREATININE, SERUM
CREATININE: 0.88 mg/dL (ref 0.44–1.00)
GFR calc Af Amer: >60 mL/min (ref >60)

## 2015-05-12 LAB — TROPONIN I
Troponin I: <0.03 ng/mL (ref <0.031)
Troponin I: <0.03 ng/mL (ref <0.031)

## 2015-05-12 MED ORDER — ONDANSETRON HCL 4 MG PO TABS: 4.0000 mg | ORAL_TABLET | Freq: Four times a day (QID) | ORAL | Status: DC | PRN

## 2015-05-12 MED ORDER — ALBUTEROL SULFATE (2.5 MG/3ML) 0.083% IN NEBU: 2.5000 mg | INHALATION_SOLUTION | Freq: Four times a day (QID) | RESPIRATORY_TRACT | Status: DC | PRN

## 2015-05-12 MED ORDER — CLOPIDOGREL BISULFATE 75 MG PO TABS
75.0000 mg | ORAL_TABLET | Freq: Once | ORAL | Status: AC
  Administered 2015-05-12: 75 mg via ORAL

## 2015-05-12 MED ORDER — ENOXAPARIN SODIUM 40 MG/0.4ML ~~LOC~~ SOLN
40.0000 mg | SUBCUTANEOUS | Status: DC
  Administered 2015-05-12: 40 mg via SUBCUTANEOUS
  Filled 2015-05-12: qty 0.4

## 2015-05-12 MED ORDER — SIMVASTATIN 20 MG PO TABS
20.0000 mg | ORAL_TABLET | Freq: Every day | ORAL | Status: DC
  Administered 2015-05-12: 20 mg via ORAL
  Filled 2015-05-12: qty 1

## 2015-05-12 MED ORDER — ONDANSETRON HCL 4 MG/2ML IJ SOLN: 4.0000 mg | Freq: Four times a day (QID) | INTRAMUSCULAR | Status: DC | PRN

## 2015-05-12 MED ORDER — SODIUM CHLORIDE 0.9 % IJ SOLN
3.0000 mL | Freq: Two times a day (BID) | INTRAMUSCULAR | Status: DC
  Administered 2015-05-12 – 2015-05-13 (×2): 3 mL via INTRAVENOUS

## 2015-05-12 MED ORDER — LORATADINE 10 MG PO TABS
10.0000 mg | ORAL_TABLET | Freq: Every day | ORAL | Status: DC
  Administered 2015-05-13: 10 mg via ORAL
  Filled 2015-05-12: qty 1

## 2015-05-12 MED ORDER — ACETAMINOPHEN 325 MG PO TABS
650.0000 mg | ORAL_TABLET | Freq: Four times a day (QID) | ORAL | Status: DC | PRN
Start: 2015-05-12 — End: 2015-05-13

## 2015-05-12 MED ORDER — ALBUTEROL SULFATE HFA 108 (90 BASE) MCG/ACT IN AERS: 2.0000 | INHALATION_SPRAY | Freq: Four times a day (QID) | RESPIRATORY_TRACT | Status: DC | PRN

## 2015-05-12 MED ORDER — FERROUS SULFATE 325 (65 FE) MG PO TABS: 325.0000 mg | ORAL_TABLET | ORAL | Status: DC

## 2015-05-12 MED ORDER — NITROGLYCERIN 0.4 MG SL SUBL
0.4000 mg | SUBLINGUAL_TABLET | SUBLINGUAL | Status: DC | PRN
  Administered 2015-05-12: 0.4 mg via SUBLINGUAL

## 2015-05-12 MED ORDER — ISOSORBIDE MONONITRATE ER 30 MG PO TB24
30.0000 mg | ORAL_TABLET | Freq: Every day | ORAL | Status: DC
  Administered 2015-05-12 – 2015-05-13 (×2): 30 mg via ORAL
  Filled 2015-05-12 (×2): qty 1

## 2015-05-12 MED ORDER — LISINOPRIL 5 MG PO TABS
5.0000 mg | ORAL_TABLET | Freq: Every day | ORAL | Status: DC
  Filled 2015-05-12: qty 1

## 2015-05-12 MED ORDER — HYDROMORPHONE HCL 1 MG/ML IJ SOLN
0.5000 mg | Freq: Once | INTRAMUSCULAR | Status: AC
  Administered 2015-05-12: 0.5 mg via INTRAVENOUS

## 2015-05-12 MED ORDER — OXYCODONE HCL 5 MG PO TABS: 5.0000 mg | ORAL_TABLET | ORAL | Status: DC | PRN

## 2015-05-12 MED ORDER — INFLUENZA VAC SPLIT QUAD 0.5 ML IM SUSY
0.5000 mL | PREFILLED_SYRINGE | INTRAMUSCULAR | Status: AC
  Administered 2015-05-13: 0.5 mL via INTRAMUSCULAR
  Filled 2015-05-12: qty 0.5

## 2015-05-12 MED ORDER — QUETIAPINE FUMARATE 100 MG PO TABS
400.0000 mg | ORAL_TABLET | Freq: Every day | ORAL | Status: DC
  Administered 2015-05-12: 400 mg via ORAL
  Filled 2015-05-12: qty 4

## 2015-05-12 MED ORDER — ACETAMINOPHEN 650 MG RE SUPP
650.0000 mg | Freq: Four times a day (QID) | RECTAL | Status: DC | PRN
Start: 2015-05-12 — End: 2015-05-13

## 2015-05-12 MED ORDER — BUDESONIDE-FORMOTEROL FUMARATE 160-4.5 MCG/ACT IN AERO
2.0000 | INHALATION_SPRAY | Freq: Two times a day (BID) | RESPIRATORY_TRACT | Status: DC
  Administered 2015-05-13: 2 via RESPIRATORY_TRACT
  Filled 2015-05-12: qty 6

## 2015-05-12 MED ORDER — AMITRIPTYLINE HCL 25 MG PO TABS
50.0000 mg | ORAL_TABLET | Freq: Every day | ORAL | Status: DC
  Administered 2015-05-12: 50 mg via ORAL
  Filled 2015-05-12: qty 2

## 2015-05-12 MED ORDER — METOPROLOL TARTRATE 25 MG PO TABS
25.0000 mg | ORAL_TABLET | Freq: Two times a day (BID) | ORAL | Status: DC
  Administered 2015-05-12: 25 mg via ORAL
  Filled 2015-05-12 (×2): qty 1

## 2015-05-12 MED ORDER — CLOPIDOGREL BISULFATE 75 MG PO TABS: 75.0000 mg | ORAL_TABLET | Freq: Every evening | ORAL | Status: DC

## 2015-05-12 MED ORDER — ONDANSETRON HCL 4 MG/2ML IJ SOLN
4.0000 mg | Freq: Once | INTRAMUSCULAR | Status: AC
  Administered 2015-05-12: 4 mg via INTRAVENOUS

## 2015-05-12 MED ORDER — LEVOTHYROXINE SODIUM 100 MCG PO TABS
100.0000 ug | ORAL_TABLET | Freq: Every day | ORAL | Status: DC
Start: 2015-05-13 — End: 2015-05-13
  Administered 2015-05-13: 100 ug via ORAL
  Filled 2015-05-12: qty 1

## 2015-05-12 MED ORDER — CITALOPRAM HYDROBROMIDE 20 MG PO TABS
40.0000 mg | ORAL_TABLET | Freq: Every day | ORAL | Status: DC
  Administered 2015-05-13: 40 mg via ORAL
  Filled 2015-05-12: qty 2

## 2015-05-12 NOTE — ED Provider Notes (Signed)
[facility] Emergency Department Provider Note  ____________________________________________  Time seen: Approximately 5:25 PM  I have reviewed the triage vital signs and the nursing notes.   HISTORY  Chief Complaint Chest Pain    HPI Carol [name] is a 59 y.o. female with history of coronary artery disease with catheter in 2015 showing RCA occlusion as well as LAD lesions which were medically managed, status post normal Myoview stress test on 02/04/2015, hypertension, bipolar disorder, history of CVA who presents for evaluation of gradual onset central chest pain which began at approximately 2:45 PM. She reports the pain did radiate into the right arm and she was nauseated and mildly short of breath. She called EMS, received 2 nitroglycerin sprays and at this time her pain has resolved. No other modifying factors. Currently she has no complaints. She has otherwise been in her usual state of health without illness. She denies any cough, sneezing, runny nose, congestion, vomiting, diarrhea, fevers or chills.   Past Medical History  Diagnosis Date  . Coronary artery disease   . Hypertension   . Bipolar affective disorder   . Hyperlipidemia   . Hypothyroid   . Myocardial infarction   . Stroke     Patient Active Problem List   Diagnosis Date Noted  . Right facial numbness 03/30/2015  . Lithium intoxication 02/27/2015  . Bipolar disorder 02/27/2015  . CVA (cerebral infarction) 02/26/2015  . Unstable angina 02/03/2015    Past Surgical History  Procedure Laterality Date  . Appendectomy    . Hysterotomy      Current Outpatient Rx  Name  Route  Sig  Dispense  Refill  . albuterol (PROVENTIL HFA;VENTOLIN HFA) 108 (90 BASE) MCG/ACT inhaler   Inhalation   Inhale 2 puffs into the lungs every 6 (six) hours as needed for wheezing or shortness of breath.         Marland Kitchen amitriptyline (ELAVIL) 50 MG tablet   Oral   Take 50 mg by mouth at bedtime.         .  budesonide-formoterol (SYMBICORT) 160-4.5 MCG/ACT inhaler   Inhalation   Inhale 2 puffs into the lungs 2 (two) times daily.          . cetirizine (ZYRTEC) 10 MG tablet   Oral   Take 10 mg by mouth daily.         . citalopram (CELEXA) 40 MG tablet   Oral   Take 40 mg by mouth daily.         . clopidogrel (PLAVIX) 75 MG tablet   Oral   Take 75 mg by mouth every evening.          . ferrous sulfate 325 (65 FE) MG tablet   Oral   Take 325 mg by mouth every 7 (seven) days. Pt takes on Saturday.         . levothyroxine (SYNTHROID, LEVOTHROID) 100 MCG tablet   Oral   Take 100 mcg by mouth daily.         Marland Kitchen lisinopril (PRINIVIL,ZESTRIL) 5 MG tablet   Oral   Take 5 mg by mouth daily.         Marland Kitchen lubiprostone (AMITIZA) 8 MCG capsule   Oral   Take 8 mcg by mouth 2 (two) times daily as needed for constipation.         . metoprolol tartrate (LOPRESSOR) 25 MG tablet   Oral   Take 25 mg by mouth 2 (two) times daily.         Marland Kitchen  QUEtiapine (SEROQUEL) 400 MG tablet   Oral   Take 400 mg by mouth at bedtime.         . simvastatin (ZOCOR) 20 MG tablet   Oral   Take 20 mg by mouth at bedtime.         . traMADol (ULTRAM) 50 MG tablet   Oral   Take 1 tablet (50 mg total) by mouth every 6 (six) hours as needed. Patient taking differently: Take 50 mg by mouth every 6 (six) hours as needed for moderate pain.    10 tablet   0     Allergies Morphine; Pregabalin; Rofecoxib; Sulfamethoxazole-trimethoprim; and Asa  Family History  Problem Relation Age of Onset  . CAD      Social History Social History  Substance Use Topics  . Smoking status: Current Every Day Smoker -- 1.00 packs/day for 40 years  . Smokeless tobacco: Never Used  . Alcohol Use: No    Review of Systems Constitutional: No fever/chills Eyes: No visual changes. ENT: No sore throat. Cardiovascular: + chest pain. Respiratory: +shortness of breath. Gastrointestinal: No abdominal pain.  No nausea,  no vomiting.  No diarrhea.  No constipation. Genitourinary: Negative for dysuria. Musculoskeletal: Negative for back pain. Skin: Negative for rash. Neurological: Negative for headaches, focal weakness or numbness.  10-point ROS otherwise negative.  ____________________________________________   PHYSICAL EXAM:  VITAL SIGNS: ED Triage Vitals  Enc Vitals Group     BP 05/12/15 1555 129/56 mmHg     Pulse Rate 05/12/15 1555 62     Resp 05/12/15 1555 20     Temp --      Temp src --      SpO2 05/12/15 1555 95 %     Weight 05/12/15 1555 160 lb (72.576 kg)     Height 05/12/15 1555 5' (1.524 m)     Head Cir --      Peak Flow --      Pain Score 05/12/15 1556 4     Pain Loc --      Pain Edu? --      Excl. in [place]? --     Constitutional: Alert and oriented. Well appearing and in no acute distress. Eyes: Conjunctivae are normal. PERRL. EOMI. Head: Atraumatic. Nose: No congestion/rhinnorhea. Mouth/Throat: Mucous membranes are moist.  Oropharynx non-erythematous. Neck: No stridor.   Cardiovascular: Normal rate, regular rhythm. Grossly normal heart sounds.  Good peripheral circulation. Respiratory: Normal respiratory effort.  No retractions. Lungs CTAB. Gastrointestinal: Soft and nontender. No distention. No abdominal bruits. No CVA tenderness. Genitourinary: deferred Musculoskeletal: No lower extremity tenderness nor edema.  No joint effusions. Neurologic:  Normal speech and language. No gross focal neurologic deficits are appreciated. No gait instability. Skin:  Skin is warm, dry and intact. No rash noted. Psychiatric: Mood and affect are normal. Speech and behavior are normal.  ____________________________________________   LABS (all labs ordered are listed, but only abnormal results are displayed)  Labs Reviewed  CBC - Abnormal; Notable for the following:    RBC 3.45 (*)    Hemoglobin 10.9 (*)    HCT 32.9 (*)    All other components within normal limits  BASIC METABOLIC  PANEL  TROPONIN I   ____________________________________________  EKG  ED ECG REPORT I, Joanne Gavel, the attending physician, personally viewed and interpreted this ECG.   Date: 05/12/2015  EKG Time: 15:59  Rate: 60  Rhythm: normal sinus rhythm  Axis: Normal  Intervals:none  ST&T Change: No acute ST  elevation. LVH. Q waves in lead 3, V2, V3. EKG unchanged from 04/09/2015.  ED ECG REPORT I, Joanne Gavel, the attending physician, personally viewed and interpreted this ECG.   Date: 05/12/2015  EKG Time: 18:58  Rate: 57  Rhythm: sinus bradycardia  Axis: normal  Intervals:none  ST&T Change: No acute ST elevation. LVH. Q waves in lead 3, the 2, V3. Unchanged from prior.   ____________________________________________  RADIOLOGY  CXR IMPRESSION: No active cardiopulmonary disease. ____________________________________________   PROCEDURES  Procedure(s) performed: None  Critical Care performed: No  ____________________________________________   INITIAL IMPRESSION / ASSESSMENT AND PLAN / ED COURSE  Pertinent labs & imaging results that were available during my care of the patient were reviewed by me and considered in my medical decision making (see chart for details).  Carol [name] is a 59 y.o. female with history of coronary artery disease with catheter in 2015 showing RCA occlusion as well as LAD lesions which were medically managed, status post normal Myoview stress test on 02/04/2015, hypertension, bipolar disorder, history of CVA who presents for evaluation of gradual onset central chest pain which began at approximately 2:45 PM. She reports that at this time her chest pain has resolved. Vital signs stable. She has a benign examination. EKG unchanged from prior. Plan for screening labs, chest x-ray, reassess for disposition.  ----------------------------------------- 6:53 PM on 05/12/2015 -----------------------------------------  First troponin negative  however at this time patient reports return of severe central chest tightness. We will obtain repeat EKG, give Plavix (no aspirin given she has allergy), morphine, subungual nitroglycerin. We'll control her pain and anticipate admission given concern for ACS/unstable angina. Not consistent with PE or acute aortic dissection.  ----------------------------------------- 7:25 PM on 05/12/2015 ----------------------------------------- Patient with near-complete resolution of pain with the above treatment, discussed with Dr. Verdell Carmine, hospitalist, for admission at this timel  ____________________________________________   FINAL CLINICAL IMPRESSION(S) / ED DIAGNOSES  Final diagnoses:  Chest pain, unspecified chest pain type      Joanne Gavel, MD 05/12/15 1926

## 2015-05-12 NOTE — Progress Notes (Signed)
Admission skin assessment with RN Yasmin

## 2015-05-12 NOTE — ED Notes (Signed)
Sharp central chest pain today. Right arm pain around 3pm. Now has improved.

## 2015-05-12 NOTE — ED Notes (Signed)
Pt to ed via ems with left sided chest pain that started today while she was knitting. Pt was given 2 nitro sprays with some relief 4/10.

## 2015-05-12 NOTE — ED Notes (Signed)
Patient called out complaining of CP same as earlier at home. Chest pain at time 7/10. Now better with SL nitro, rates 4/10 now.

## 2015-05-12 NOTE — H&P (Signed)
[facility] [place] NAME: Carol [name]    MR#:  [id number]  DATE OF BIRTH:  Feb 02, 1956  DATE OF ADMISSION:  05/12/2015  PRIMARY CARE PHYSICIAN: Christie Nottingham., PA   REQUESTING/REFERRING PHYSICIAN: Dr. Darrick Penna  CHIEF COMPLAINT:   Chief Complaint  Patient presents with  . Chest Pain    HISTORY OF PRESENT ILLNESS:  Carol [name]  is a 59 y.o. female with a known history of hypertension, hyperlipidemia, hypothyroidism, bipolar disorder, history of coronary disease, history of previous MI, history of previous CVA who presented to the hospital complaining of chest pain. Patient describes the chest pain is sharp in nature located in the center of her chest sometimes radiating to her left arm. The pain was associated with some diaphoresis, nausea but no vomiting. Patient was just watching TV when the pain began. Patient presented to the emergency room and had a recurrent episode of chest pain. Hospitalist services were contacted further treatment and evaluation  PAST MEDICAL HISTORY:   Past Medical History  Diagnosis Date  . Coronary artery disease   . Hypertension   . Bipolar affective disorder   . Hyperlipidemia   . Hypothyroid   . Myocardial infarction   . Stroke     PAST SURGICAL HISTORY:   Past Surgical History  Procedure Laterality Date  . Appendectomy    . Hysterotomy      SOCIAL HISTORY:   Social History  Substance Use Topics  . Smoking status: Current Every Day Smoker -- 1.00 packs/day for 40 years  . Smokeless tobacco: Never Used  . Alcohol Use: No    FAMILY HISTORY:   Family History  Problem Relation Age of Onset  . CAD      DRUG ALLERGIES:   Allergies  Allergen Reactions  . Morphine Other (See Comments)    Reaction:  Hallucinations   . Pregabalin Other (See Comments)    Pt states that she was told not to take this medication.    . Rofecoxib Other (See Comments)    Reaction:  Burning of stomach    . Sulfamethoxazole-Trimethoprim Other (See Comments)    Reaction:  Unknown   . Asa [Aspirin] Hives, Nausea Only and Other (See Comments)    Reaction:  Burning of stomach     REVIEW OF SYSTEMS:   Review of Systems  Constitutional: Negative for fever and weight loss.  HENT: Negative for congestion, nosebleeds and tinnitus.   Eyes: Negative for blurred vision, double vision and redness.  Respiratory: Negative for cough, hemoptysis and shortness of breath.   Cardiovascular: Positive for chest pain. Negative for orthopnea, leg swelling and PND.  Gastrointestinal: Negative for nausea, vomiting, abdominal pain, diarrhea and melena.  Genitourinary: Negative for dysuria, urgency and hematuria.  Musculoskeletal: Negative for joint pain and falls.  Neurological: Negative for dizziness, tingling, sensory change, focal weakness, seizures, weakness and headaches.  Endo/Heme/Allergies: Negative for polydipsia. Does not bruise/bleed easily.  Psychiatric/Behavioral: Negative for depression and memory loss. The patient is not nervous/anxious.     MEDICATIONS AT HOME:   Prior to Admission medications   Medication Sig Start Date End Date Taking? Authorizing Provider  albuterol (PROVENTIL HFA;VENTOLIN HFA) 108 (90 BASE) MCG/ACT inhaler Inhale 2 puffs into the lungs every 6 (six) hours as needed for wheezing or shortness of breath.   Yes Historical Provider, MD  amitriptyline (ELAVIL) 50 MG tablet Take 50 mg by mouth at bedtime.   Yes Historical Provider, MD  budesonide-formoterol (SYMBICORT) 160-4.5 MCG/ACT inhaler Inhale 2 puffs into the lungs 2 (two) times daily.    Yes Historical Provider, MD  cetirizine (ZYRTEC) 10 MG tablet Take 10 mg by mouth daily.   Yes Historical Provider, MD  citalopram (CELEXA) 40 MG tablet Take 40 mg by mouth daily.   Yes Historical Provider, MD  clopidogrel (PLAVIX) 75 MG tablet Take 75 mg by mouth every evening.    Yes Historical Provider, MD  ferrous sulfate 325 (65 FE)  MG tablet Take 325 mg by mouth every 7 (seven) days. Pt takes on Saturday.   Yes Historical Provider, MD  levothyroxine (SYNTHROID, LEVOTHROID) 100 MCG tablet Take 100 mcg by mouth daily.   Yes Historical Provider, MD  lisinopril (PRINIVIL,ZESTRIL) 5 MG tablet Take 5 mg by mouth daily.   Yes Historical Provider, MD  lubiprostone (AMITIZA) 8 MCG capsule Take 8 mcg by mouth 2 (two) times daily as needed for constipation.   Yes Historical Provider, MD  metoprolol tartrate (LOPRESSOR) 25 MG tablet Take 25 mg by mouth 2 (two) times daily.   Yes Historical Provider, MD  QUEtiapine (SEROQUEL) 400 MG tablet Take 400 mg by mouth at bedtime.   Yes Historical Provider, MD  simvastatin (ZOCOR) 20 MG tablet Take 20 mg by mouth at bedtime.   Yes Historical Provider, MD  traMADol (ULTRAM) 50 MG tablet Take 1 tablet (50 mg total) by mouth every 6 (six) hours as needed. Patient taking differently: Take 50 mg by mouth every 6 (six) hours as needed for moderate pain.  02/04/15  Yes Gladstone Lighter, MD      VITAL SIGNS:  Blood pressure 129/58, pulse 59, resp. rate 12, height 5' (1.524 m), weight 72.576 kg (160 lb), SpO2 99 %.  PHYSICAL EXAMINATION:  Physical Exam  GENERAL:  59 y.o.-year-old patient lying in the bed with no acute distress.  EYES: Pupils equal, round, reactive to light and accommodation. No scleral icterus. Extraocular muscles intact.  HEENT: Head atraumatic, normocephalic. Oropharynx and nasopharynx clear. No oropharyngeal erythema, moist oral mucosa  NECK:  Supple, no jugular venous distention. No thyroid enlargement, no tenderness.  LUNGS: Normal breath sounds bilaterally, no wheezing, rales, rhonchi. No use of accessory muscles of respiration.  CARDIOVASCULAR: S1, S2 RRR. No murmurs, rubs, gallops, clicks.  ABDOMEN: Soft, nontender, nondistended. Bowel sounds present. No organomegaly or mass.  EXTREMITIES: No pedal edema, cyanosis, or clubbing. + 2 pedal & radial pulses b/l.   NEUROLOGIC:  Cranial nerves II through XII are intact. No focal Motor or sensory deficits appreciated b/l PSYCHIATRIC: The patient is alert and oriented x 3. Good affect.  SKIN: No obvious rash, lesion, or ulcer.   LABORATORY PANEL:   CBC  Recent Labs Lab 05/12/15 1559  WBC 5.6  HGB 10.9*  HCT 32.9*  PLT 180   ------------------------------------------------------------------------------------------------------------------  Chemistries   Recent Labs Lab 05/12/15 1559  NA 138  K 4.2  CL 106  CO2 24  GLUCOSE 78  BUN 17  CREATININE 0.88  CALCIUM 9.9   ------------------------------------------------------------------------------------------------------------------  Cardiac Enzymes  Recent Labs Lab 05/12/15 1559  TROPONINI <0.03   ------------------------------------------------------------------------------------------------------------------  RADIOLOGY:  Dg Chest 2 View  05/12/2015   CLINICAL DATA:  Shortness of breath.  Chest pain.  EXAM: CHEST  2 VIEW  COMPARISON:  02/03/2015  FINDINGS: Heart size and pulmonary vascularity are normal. No infiltrates or effusions. Slight linear scarring in both lungs, stable. No osseous abnormality. The vague nodular appearing density seen overlying the left lung base on  the prior study is not apparent on the current exam. I suspect it represented a nipple shadow.  IMPRESSION: No active cardiopulmonary disease.   Electronically Signed   By: Lorriane Shire M.D.   On: 05/12/2015 16:34     IMPRESSION AND PLAN:   59 year old female with past medical history of hypertension, hyperlipidemia, bipolar disorder, fibromyalgia, history of MI, history of previous CVA, who presented to the hospital due to chest pain.  #1 chest pain-patient's pain is very atypical. She does have risk factors given her previous history of MI and CVA. -Patient had a recent stress test in June 2016 which was negative. She is also had a recent cardiac catheterization 2 years  back which showed 100% RCA stenosis which is being medically managed. -Observe on telemetry, cycle cardiac markers. -Continue Plavix, statin, nitroglycerin.  Will add some low-dose Imdur. -If symptoms unimproved consider consulting cardiology. Pt. Is known to Dr. Ubaldo Glassing.   #2 hypertension-hemodynamically stable. -Continue metoprolol, lisinopril, Imdur  #3 hypothyroidism-continue Synthroid.  #4 COPD with ongoing tobacco abuse-no acute exacerbation. -Continue Symbicort.  #5 depression-continue Celexa, Elavil.  #6 chronic pain with history of fibromyalgia-continue as needed oxycodone. Patient has high drug seeking behavior.  Patient can likely be discharged home if she has been ruled out from a cardiac perspective.  All the records are reviewed and case discussed with ED provider. Management plans discussed with the patient, family and they are in agreement.  CODE STATUS: Full  TOTAL TIME TAKING CARE OF THIS PATIENT: 45 minutes.    Henreitta Leber M.D on 05/12/2015 at 7:46 PM  Between 7am to 6pm - Pager - [phone]  After 6pm go to [email or web address] - password [email or web address] [facility]  [facility]  Office  [phone]  CC: Primary care physician; Christie Nottingham., PA

## 2015-05-13 DIAGNOSIS — E039 Hypothyroidism, unspecified: Secondary | ICD-10-CM | POA: Diagnosis not present

## 2015-05-13 DIAGNOSIS — G894 Chronic pain syndrome: Secondary | ICD-10-CM | POA: Diagnosis not present

## 2015-05-13 DIAGNOSIS — R079 Chest pain, unspecified: Secondary | ICD-10-CM | POA: Diagnosis not present

## 2015-05-13 DIAGNOSIS — F319 Bipolar disorder, unspecified: Secondary | ICD-10-CM | POA: Diagnosis not present

## 2015-05-13 LAB — BASIC METABOLIC PANEL
Anion gap: 2 — ABNORMAL LOW (ref 5–15)
BUN: 16 mg/dL (ref 6–20)
CO2: 28 mmol/L (ref 22–32)
CREATININE: 0.95 mg/dL (ref 0.44–1.00)
Calcium: 9.4 mg/dL (ref 8.9–10.3)
Chloride: 109 mmol/L (ref 101–111)
Glucose, Bld: 126 mg/dL — ABNORMAL HIGH (ref 65–99)
Potassium: 4 mmol/L (ref 3.5–5.1)
SODIUM: 139 mmol/L (ref 135–145)

## 2015-05-13 LAB — CBC
HCT: 33.7 % — ABNORMAL LOW (ref 35.0–47.0)
Hemoglobin: 11.5 g/dL — ABNORMAL LOW (ref 12.0–16.0)
MCH: 32.5 pg (ref 26.0–34.0)
MCHC: 34 g/dL (ref 32.0–36.0)
MCV: 95.4 fL (ref 80.0–100.0)
PLATELETS: 192 10*3/uL (ref 150–440)
RBC: 3.53 MIL/uL — ABNORMAL LOW (ref 3.80–5.20)
RDW: 13.3 % (ref 11.5–14.5)
WBC: 6.9 10*3/uL (ref 3.6–11.0)

## 2015-05-13 LAB — TROPONIN I

## 2015-05-13 MED ORDER — CITALOPRAM HYDROBROMIDE 40 MG PO TABS: 20.0000 mg | ORAL_TABLET | Freq: Every day | ORAL | Status: DC

## 2015-05-13 NOTE — Discharge Instructions (Signed)

## 2015-05-13 NOTE — Discharge Summary (Signed)
Carol [name], 59 y.o., DOB 1956-02-17, MRN [id number]. Admission date: 05/12/2015 Discharge Date 05/13/2015 Primary MD Christie Nottingham., PA Admitting Physician Henreitta Leber, MD  Admission Diagnosis  Chest pain, unspecified chest pain type [R07.9]  Discharge Diagnosis   Active Problems: Atypical noncardiac Chest pain Coronary artery disease Hypertension Bipolar disorder Hyperlipidemia Hypothyroidism History of previous CVA and MI       Hospital Course Carol [name] is a 59 y.o. female with a known history of hypertension, hyperlipidemia, hypothyroidism, bipolar disorder, history of coronary disease, history of previous MI, history of previous CVA who presented to the hospital complaining of chest pain. Patient describes the chest pain is sharp in nature located in the center of her chest sometimes radiating to her left arm. Patient was seen in the emergency room EKG was nonrevealing her chest pain was thought to be atypical. Patient has had a recent stress test 2 weeks ago by her cardiologist. Patient's cardiac enzymes remained negative she is currently asymptomatic and chest pain-free she'll need a close outpatient follow-up if her symptoms persist she'll need a cardiac catheterization.            Consults  None  Significant Tests:  See full reports for all details    Dg Chest 2 View  05/12/2015   CLINICAL DATA:  Shortness of breath.  Chest pain.  EXAM: CHEST  2 VIEW  COMPARISON:  02/03/2015  FINDINGS: Heart size and pulmonary vascularity are normal. No infiltrates or effusions. Slight linear scarring in both lungs, stable. No osseous abnormality. The vague nodular appearing density seen overlying the left lung base on the prior study is not apparent on the current exam. I suspect it represented a nipple shadow.  IMPRESSION: No active cardiopulmonary disease.   Electronically Signed   By: Lorriane Shire M.D.   On: 05/12/2015 16:34       Today   Subjective:   Carol [name]   denies any chest pain or shortness of breath  Objective:   Blood pressure 98/39, pulse 62, temperature 97.7 F (36.5 C), temperature source Oral, resp. rate 18, height 5' (1.524 m), weight 70.126 kg (154 lb 9.6 oz), SpO2 99 %.  .  Intake/Output Summary (Last 24 hours) at 05/13/15 1140 Last data filed at 05/13/15 0747  Gross per 24 hour  Intake    240 ml  Output    500 ml  Net   -260 ml    Exam VITAL SIGNS: Blood pressure 98/39, pulse 62, temperature 97.7 F (36.5 C), temperature source Oral, resp. rate 18, height 5' (1.524 m), weight 70.126 kg (154 lb 9.6 oz), SpO2 99 %.  GENERAL:  59 y.o.-year-old patient lying in the bed with no acute distress.  EYES: Pupils equal, round, reactive to light and accommodation. No scleral icterus. Extraocular muscles intact.  HEENT: Head atraumatic, normocephalic. Oropharynx and nasopharynx clear.  NECK:  Supple, no jugular venous distention. No thyroid enlargement, no tenderness.  LUNGS: Normal breath sounds bilaterally, no wheezing, rales,rhonchi or crepitation. No use of accessory muscles of respiration.  CARDIOVASCULAR: S1, S2 normal. No murmurs, rubs, or gallops.  ABDOMEN: Soft, nontender, nondistended. Bowel sounds present. No organomegaly or mass.  EXTREMITIES: No pedal edema, cyanosis, or clubbing.  NEUROLOGIC: Cranial nerves II through XII are intact. Muscle strength 5/5 in all extremities. Sensation intact. Gait not checked.  PSYCHIATRIC: The patient is alert and oriented x 3.  SKIN: No obvious rash, lesion, or ulcer.   Data Review     CBC  w Diff: Lab Results  Component Value Date   WBC 6.9 05/13/2015   WBC 8.8 04/24/2014   HGB 11.5* 05/13/2015   HGB 10.8* 04/24/2014   HCT 33.7* 05/13/2015   HCT 33.9* 04/24/2014   PLT 192 05/13/2015   PLT 207 04/24/2014   LYMPHOPCT 26 04/09/2015   LYMPHOPCT 14.4 04/24/2014   MONOPCT 7 04/09/2015   MONOPCT 4.9 04/24/2014   EOSPCT 2 04/09/2015   EOSPCT 4.6 04/24/2014   BASOPCT 0 04/09/2015    BASOPCT 0.9 04/24/2014   CMP: Lab Results  Component Value Date   NA 139 05/13/2015   NA 140 03/15/2014   K 4.0 05/13/2015   K 4.0 03/15/2014   CL 109 05/13/2015   CL 109* 03/15/2014   CO2 28 05/13/2015   CO2 29 03/15/2014   BUN 16 05/13/2015   BUN 8 03/15/2014   CREATININE 0.95 05/13/2015   CREATININE 1.01 03/15/2014   PROT 7.0 04/09/2015   PROT 7.0 03/15/2014   ALBUMIN 4.0 04/09/2015   ALBUMIN 3.4 03/15/2014   BILITOT 0.2* 04/09/2015   BILITOT 0.6 03/15/2014   ALKPHOS 103 04/09/2015   ALKPHOS 150* 03/15/2014   AST 21 04/09/2015   AST 21 03/15/2014   ALT 16 04/09/2015   ALT 20 03/15/2014  .  Micro Results No results found for this or any previous visit (from the past 240 hour(s)).      Code Status Orders        Start     Ordered   05/12/15 2125  Full code   Continuous     05/12/15 2124          Follow-up Information    Follow up with Teodoro Spray., MD.   Specialty:  Cardiology   Why:  Friday, October 7th at 230pm, ccs   Contact information:   [place] [phone]       Discharge Medications     Medication List    TAKE these medications        albuterol 108 (90 BASE) MCG/ACT inhaler  Commonly known as:  PROVENTIL HFA;VENTOLIN HFA  Inhale 2 puffs into the lungs every 6 (six) hours as needed for wheezing or shortness of breath.     amitriptyline 50 MG tablet  Commonly known as:  ELAVIL  Take 50 mg by mouth at bedtime.     budesonide-formoterol 160-4.5 MCG/ACT inhaler  Commonly known as:  SYMBICORT  Inhale 2 puffs into the lungs 2 (two) times daily.     cetirizine 10 MG tablet  Commonly known as:  ZYRTEC  Take 10 mg by mouth daily.     citalopram 40 MG tablet  Commonly known as:  CELEXA  Take 40 mg by mouth daily.     clopidogrel 75 MG tablet  Commonly known as:  PLAVIX  Take 75 mg by mouth every evening.     ferrous sulfate 325 (65 FE) MG tablet  Take 325 mg by mouth every 7 (seven) days.  Pt takes on Saturday.     levothyroxine 100 MCG tablet  Commonly known as:  SYNTHROID, LEVOTHROID  Take 100 mcg by mouth daily.     lisinopril 5 MG tablet  Commonly known as:  PRINIVIL,ZESTRIL  Take 5 mg by mouth daily.     lubiprostone 8 MCG capsule  Commonly known as:  AMITIZA  Take 8 mcg by mouth 2 (two) times daily as needed for constipation.     metoprolol tartrate 25 MG tablet  Commonly known as:  LOPRESSOR  Take 25 mg by mouth 2 (two) times daily.     QUEtiapine 400 MG tablet  Commonly known as:  SEROQUEL  Take 400 mg by mouth at bedtime.     simvastatin 20 MG tablet  Commonly known as:  ZOCOR  Take 20 mg by mouth at bedtime.     traMADol 50 MG tablet  Commonly known as:  ULTRAM  Take 1 tablet (50 mg total) by mouth every 6 (six) hours as needed.           Total Time in preparing paper work, data evaluation and todays exam - 35 minutes  Dustin Flock M.D on 05/13/2015 at 11:40 AM  [place] Carol [name], 59 y.o., DOB May 09, 1956, MRN [id number]. Admission date: 05/12/2015 Discharge Date 05/13/2015 Primary MD Christie Nottingham., PA Admitting Physician Henreitta Leber, MD        .       Total Time in preparing paper work, data evaluation and todays exam - 35 minutes  Dustin Flock M.D on 05/13/2015 at 11:40 AM  [facility]   Office  [phone]

## 2015-05-13 NOTE — Progress Notes (Signed)
Patient d/c'd home. Education provided, no questions at this time. Patient to be picked up by son. Telemetry removed. Brandi R Mansfield  

## 2015-05-18 DIAGNOSIS — I69319 Unspecified symptoms and signs involving cognitive functions following cerebral infarction: Secondary | ICD-10-CM | POA: Insufficient documentation

## 2015-05-30 DIAGNOSIS — H2513 Age-related nuclear cataract, bilateral: Secondary | ICD-10-CM | POA: Diagnosis not present

## 2015-05-30 DIAGNOSIS — H52223 Regular astigmatism, bilateral: Secondary | ICD-10-CM | POA: Diagnosis not present

## 2015-05-30 DIAGNOSIS — H524 Presbyopia: Secondary | ICD-10-CM | POA: Diagnosis not present

## 2015-05-30 DIAGNOSIS — E039 Hypothyroidism, unspecified: Secondary | ICD-10-CM | POA: Diagnosis not present

## 2015-05-30 DIAGNOSIS — I10 Essential (primary) hypertension: Secondary | ICD-10-CM | POA: Diagnosis not present

## 2015-05-30 DIAGNOSIS — H5213 Myopia, bilateral: Secondary | ICD-10-CM | POA: Diagnosis not present

## 2015-06-04 DIAGNOSIS — I10 Essential (primary) hypertension: Secondary | ICD-10-CM | POA: Diagnosis not present

## 2015-06-04 DIAGNOSIS — I2510 Atherosclerotic heart disease of native coronary artery without angina pectoris: Secondary | ICD-10-CM | POA: Diagnosis not present

## 2015-06-04 DIAGNOSIS — E782 Mixed hyperlipidemia: Secondary | ICD-10-CM | POA: Diagnosis not present

## 2015-06-16 DIAGNOSIS — G43B0 Ophthalmoplegic migraine, not intractable: Secondary | ICD-10-CM | POA: Diagnosis not present

## 2015-06-16 DIAGNOSIS — E039 Hypothyroidism, unspecified: Secondary | ICD-10-CM | POA: Diagnosis not present

## 2015-06-16 DIAGNOSIS — H2513 Age-related nuclear cataract, bilateral: Secondary | ICD-10-CM | POA: Diagnosis not present

## 2015-06-16 DIAGNOSIS — H524 Presbyopia: Secondary | ICD-10-CM | POA: Diagnosis not present

## 2015-06-16 DIAGNOSIS — I10 Essential (primary) hypertension: Secondary | ICD-10-CM | POA: Diagnosis not present

## 2015-06-16 DIAGNOSIS — H52223 Regular astigmatism, bilateral: Secondary | ICD-10-CM | POA: Diagnosis not present

## 2015-06-16 DIAGNOSIS — H5213 Myopia, bilateral: Secondary | ICD-10-CM | POA: Diagnosis not present

## 2015-07-13 ENCOUNTER — Emergency Department: Payer: Medicare Other

## 2015-07-13 ENCOUNTER — Encounter: Payer: Self-pay | Admitting: Emergency Medicine

## 2015-07-13 ENCOUNTER — Emergency Department
Admission: EM | Admit: 2015-07-13 | Discharge: 2015-07-13 | Disposition: A | Discharge status: Home / Self Care | Payer: Medicare Other | Attending: Emergency Medicine | Admitting: Emergency Medicine

## 2015-07-13 DIAGNOSIS — W1842XA Slipping, tripping and stumbling without falling due to stepping into hole or opening, initial encounter: Secondary | ICD-10-CM | POA: Diagnosis not present

## 2015-07-13 DIAGNOSIS — S93601A Unspecified sprain of right foot, initial encounter: Secondary | ICD-10-CM | POA: Diagnosis not present

## 2015-07-13 DIAGNOSIS — S99911A Unspecified injury of right ankle, initial encounter: Secondary | ICD-10-CM | POA: Diagnosis present

## 2015-07-13 DIAGNOSIS — Z7902 Long term (current) use of antithrombotics/antiplatelets: Secondary | ICD-10-CM | POA: Insufficient documentation

## 2015-07-13 DIAGNOSIS — Y92007 Garden or yard of unspecified non-institutional (private) residence as the place of occurrence of the external cause: Secondary | ICD-10-CM | POA: Diagnosis not present

## 2015-07-13 DIAGNOSIS — Z79899 Other long term (current) drug therapy: Secondary | ICD-10-CM | POA: Insufficient documentation

## 2015-07-13 DIAGNOSIS — Z87891 Personal history of nicotine dependence: Secondary | ICD-10-CM | POA: Insufficient documentation

## 2015-07-13 DIAGNOSIS — Y998 Other external cause status: Secondary | ICD-10-CM | POA: Insufficient documentation

## 2015-07-13 DIAGNOSIS — Z7951 Long term (current) use of inhaled steroids: Secondary | ICD-10-CM | POA: Diagnosis not present

## 2015-07-13 DIAGNOSIS — Y9389 Activity, other specified: Secondary | ICD-10-CM | POA: Insufficient documentation

## 2015-07-13 DIAGNOSIS — I10 Essential (primary) hypertension: Secondary | ICD-10-CM | POA: Insufficient documentation

## 2015-07-13 DIAGNOSIS — M7731 Calcaneal spur, right foot: Secondary | ICD-10-CM | POA: Diagnosis not present

## 2015-07-13 MED ORDER — TRAMADOL HCL 50 MG PO TABS: 50.0000 mg | ORAL_TABLET | Freq: Four times a day (QID) | ORAL | Status: DC | PRN

## 2015-07-13 MED ORDER — TRAMADOL HCL 50 MG PO TABS
50.0000 mg | ORAL_TABLET | Freq: Once | ORAL | Status: AC
  Administered 2015-07-13: 50 mg via ORAL
  Filled 2015-07-13: qty 1

## 2015-07-13 MED ORDER — NAPROXEN 500 MG PO TABS
500.0000 mg | ORAL_TABLET | Freq: Once | ORAL | Status: AC
  Administered 2015-07-13: 500 mg via ORAL
  Filled 2015-07-13: qty 1

## 2015-07-13 MED ORDER — NAPROXEN 500 MG PO TABS: 500.0000 mg | ORAL_TABLET | Freq: Two times a day (BID) | ORAL | Status: DC

## 2015-07-13 NOTE — Discharge Instructions (Signed)

## 2015-07-13 NOTE — ED Provider Notes (Signed)
[facility] Emergency Department Provider Note  ____________________________________________  Time seen: Approximately 3:24 PM  I have reviewed the triage vital signs and the nursing notes.   HISTORY  Chief Complaint Ankle Pain    HPI Carol [name] is a 59 y.o. female patient complaining of right ankle pain for 2 days. Patient states 2 weeks ago she stepped in a hole in the yard has some mild edema and recovered without any medical intervention. Patient stated there was no identifiable vocative incident for the pain 2 days ago. Patient state the pain is on the top part of her foot near the ankle. Patient stated pain increases with weightbearing. No palliative measures taken for this complaint. She is rating the pain as 8/10. Patient describes pain as sharp.   Past Medical History  Diagnosis Date  . Coronary artery disease   . Hypertension   . Bipolar affective disorder ([place])   . Hyperlipidemia   . Hypothyroid   . Myocardial infarction ([place])   . Stroke [facility])     Patient Active Problem List   Diagnosis Date Noted  . Chest pain 05/12/2015  . Right facial numbness 03/30/2015  . Lithium intoxication 02/27/2015  . Bipolar disorder ([place]) 02/27/2015  . CVA (cerebral infarction) 02/26/2015  . Unstable angina ([place]) 02/03/2015    Past Surgical History  Procedure Laterality Date  . Appendectomy    . Hysterotomy      Current Outpatient Rx  Name  Route  Sig  Dispense  Refill  . albuterol (PROVENTIL HFA;VENTOLIN HFA) 108 (90 BASE) MCG/ACT inhaler   Inhalation   Inhale 2 puffs into the lungs every 6 (six) hours as needed for wheezing or shortness of breath.         Marland Kitchen amitriptyline (ELAVIL) 50 MG tablet   Oral   Take 50 mg by mouth at bedtime.         . budesonide-formoterol (SYMBICORT) 160-4.5 MCG/ACT inhaler   Inhalation   Inhale 2 puffs into the lungs 2 (two) times daily.          . cetirizine (ZYRTEC) 10 MG tablet   Oral   Take 10 mg by  mouth daily.         . citalopram (CELEXA) 40 MG tablet   Oral   Take 0.5 tablets (20 mg total) by mouth daily.   1 tablet   0     Pt may take half of her 40mg  , this is due to conc ...   . clopidogrel (PLAVIX) 75 MG tablet   Oral   Take 75 mg by mouth every evening.          . ferrous sulfate 325 (65 FE) MG tablet   Oral   Take 325 mg by mouth every 7 (seven) days. Pt takes on Saturday.         . levothyroxine (SYNTHROID, LEVOTHROID) 100 MCG tablet   Oral   Take 100 mcg by mouth daily.         Marland Kitchen lisinopril (PRINIVIL,ZESTRIL) 5 MG tablet   Oral   Take 5 mg by mouth daily.         Marland Kitchen lubiprostone (AMITIZA) 8 MCG capsule   Oral   Take 8 mcg by mouth 2 (two) times daily as needed for constipation.         . metoprolol tartrate (LOPRESSOR) 25 MG tablet   Oral   Take 25 mg by mouth 2 (two) times daily.         Marland Kitchen  naproxen (NAPROSYN) 500 MG tablet   Oral   Take 1 tablet (500 mg total) by mouth 2 (two) times daily with a meal.   20 tablet   0   . QUEtiapine (SEROQUEL) 400 MG tablet   Oral   Take 400 mg by mouth at bedtime.         . simvastatin (ZOCOR) 20 MG tablet   Oral   Take 20 mg by mouth at bedtime.         . traMADol (ULTRAM) 50 MG tablet   Oral   Take 1 tablet (50 mg total) by mouth every 6 (six) hours as needed. Patient taking differently: Take 50 mg by mouth every 6 (six) hours as needed for moderate pain.    10 tablet   0   . traMADol (ULTRAM) 50 MG tablet   Oral   Take 1 tablet (50 mg total) by mouth every 6 (six) hours as needed for moderate pain.   12 tablet   0     Allergies Morphine; Pregabalin; Rofecoxib; Sulfamethoxazole-trimethoprim; and Asa  Family History  Problem Relation Age of Onset  . CAD      Social History Social History  Substance Use Topics  . Smoking status: Former Smoker -- 1.00 packs/day for 40 years  . Smokeless tobacco: Never Used  . Alcohol Use: No    Review of Systems Constitutional: No  fever/chills Eyes: No visual changes. ENT: No sore throat. Cardiovascular: Denies chest pain. Respiratory: Denies shortness of breath. Gastrointestinal: No abdominal pain.  No nausea, no vomiting.  No diarrhea.  No constipation. Genitourinary: Negative for dysuria. Musculoskeletal: Right foot and ankle pain. Skin: Negative for rash. Neurological: Negative for headaches, focal weakness or numbness. 10-point ROS otherwise negative.  ____________________________________________   PHYSICAL EXAM:  VITAL SIGNS: ED Triage Vitals  Enc Vitals Group     BP 07/13/15 1423 107/54 mmHg     Pulse Rate 07/13/15 1423 58     Resp 07/13/15 1423 18     Temp 07/13/15 1423 97.8 F (36.6 C)     Temp Source 07/13/15 1423 Oral     SpO2 07/13/15 1423 94 %     Weight 07/13/15 1423 152 lb (68.947 kg)     Height 07/13/15 1423 5' (1.524 m)     Head Cir --      Peak Flow --      Pain Score 07/13/15 1424 8     Pain Loc --      Pain Edu? --      Excl. in [place]? --     Constitutional: Alert and oriented. Well appearing and in no acute distress. Eyes: Conjunctivae are normal. PERRL. EOMI. Head: Atraumatic. Nose: No congestion/rhinnorhea. Mouth/Throat: Mucous membranes are moist.  Oropharynx non-erythematous. Neck: No stridor. No cervical spine tenderness to palpation. Hematological/Lymphatic/Immunilogical: No cervical lymphadenopathy. Cardiovascular: Normal rate, regular rhythm. Grossly normal heart sounds.  Good peripheral circulation. Respiratory: Normal respiratory effort.  No retractions. Lungs CTAB. Gastrointestinal: Soft and nontender. No distention. No abdominal bruits. No CVA tenderness. Musculoskeletal: No obvious deformity edema or ecchymosis to the right foot and ankle. Moderate guarding palpation of the ankle mortise. Full and equal passive and active range of motion. Patient demonstrated atypical gait favoring the right lower extremity when ambulating.  Neurologic:  Normal speech and language.  No gross focal neurologic deficits are appreciated. No gait instability. Skin:  Skin is warm, dry and intact. No rash noted. Psychiatric: Mood and affect are normal. Speech and behavior  are normal.  ____________________________________________   LABS (all labs ordered are listed, but only abnormal results are displayed)  Labs Reviewed - No data to display ____________________________________________  EKG   ____________________________________________  RADIOLOGY  No acute findings on x-ray. Incidental finding of acutely spur which is small. I, Sable Feil, personally viewed and evaluated these images (plain radiographs) as part of my medical decision making.   ____________________________________________   PROCEDURES  Procedure(s) performed: None  Critical Care performed: No  ____________________________________________   INITIAL IMPRESSION / ASSESSMENT AND PLAN / ED COURSE  Pertinent labs & imaging results that were available during my care of the patient were reviewed by me and considered in my medical decision making (see chart for details).  Right right foot. Small right ankle spur. Discussed x-ray findings with the patient. Patient given Ace wrap and advised to wear open shoe for 3-5 days. Patient advised to follow-up with family doctor if no improvement in 3 days. He is given a prescription for tramadol and naproxen. ____________________________________________   FINAL CLINICAL IMPRESSION(S) / ED DIAGNOSES  Final diagnoses:  Sprain of right foot, initial encounter       Sable Feil, PA-C 07/13/15 [place], MD 07/13/15 2330

## 2015-07-13 NOTE — ED Notes (Signed)
Patient c/o pain in right ankle since Friday. Patient states that she stepped into a hole in the yard 2 weeks ago and rolled her ankle but denies any other injuries.

## 2015-07-13 NOTE — ED Notes (Signed)
R ankle pain x 2 days, denies injury, increased pain with weight bearing.

## 2015-07-21 DIAGNOSIS — G5751 Tarsal tunnel syndrome, right lower limb: Secondary | ICD-10-CM | POA: Diagnosis not present

## 2015-08-20 ENCOUNTER — Emergency Department
Admission: EM | Admit: 2015-08-20 | Discharge: 2015-08-21 | Disposition: A | Discharge status: Home / Self Care | Payer: Commercial Managed Care - HMO | Attending: Emergency Medicine | Admitting: Emergency Medicine

## 2015-08-20 ENCOUNTER — Encounter: Payer: Self-pay | Admitting: *Deleted

## 2015-08-20 ENCOUNTER — Emergency Department: Payer: Commercial Managed Care - HMO

## 2015-08-20 DIAGNOSIS — Z79899 Other long term (current) drug therapy: Secondary | ICD-10-CM | POA: Insufficient documentation

## 2015-08-20 DIAGNOSIS — Z87891 Personal history of nicotine dependence: Secondary | ICD-10-CM | POA: Insufficient documentation

## 2015-08-20 DIAGNOSIS — R079 Chest pain, unspecified: Secondary | ICD-10-CM | POA: Diagnosis not present

## 2015-08-20 DIAGNOSIS — I2089 Other forms of angina pectoris: Secondary | ICD-10-CM

## 2015-08-20 DIAGNOSIS — I10 Essential (primary) hypertension: Secondary | ICD-10-CM | POA: Insufficient documentation

## 2015-08-20 DIAGNOSIS — I209 Angina pectoris, unspecified: Secondary | ICD-10-CM | POA: Insufficient documentation

## 2015-08-20 DIAGNOSIS — I208 Other forms of angina pectoris: Secondary | ICD-10-CM

## 2015-08-20 LAB — CBC
HCT: 38.3 % (ref 35.0–47.0)
Hemoglobin: 12.9 g/dL (ref 12.0–16.0)
MCH: 31 pg (ref 26.0–34.0)
MCHC: 33.6 g/dL (ref 32.0–36.0)
MCV: 92.3 fL (ref 80.0–100.0)
PLATELETS: 197 10*3/uL (ref 150–440)
RBC: 4.15 MIL/uL (ref 3.80–5.20)
RDW: 14.1 % (ref 11.5–14.5)
WBC: 6.6 10*3/uL (ref 3.6–11.0)

## 2015-08-20 LAB — TROPONIN I: Troponin I: <0.03 ng/mL (ref <0.031)

## 2015-08-20 LAB — BASIC METABOLIC PANEL
Anion gap: 6 (ref 5–15)
BUN: 13 mg/dL (ref 6–20)
CALCIUM: 10 mg/dL (ref 8.9–10.3)
CHLORIDE: 106 mmol/L (ref 101–111)
CO2: 24 mmol/L (ref 22–32)
CREATININE: 0.93 mg/dL (ref 0.44–1.00)
GFR calc Af Amer: >60 mL/min (ref >60)
GFR calc non Af Amer: >60 mL/min (ref >60)
GLUCOSE: 136 mg/dL — AB (ref 65–99)
Potassium: 4.1 mmol/L (ref 3.5–5.1)
Sodium: 136 mmol/L (ref 135–145)

## 2015-08-20 NOTE — ED Notes (Signed)
Pt arrives with complaints of chest pain, states nausea and left arm pain, states she went to the fire station and the medics stated she was okay, arrives tearful and hyperventilating

## 2015-08-21 LAB — TROPONIN I: Troponin I: <0.03 ng/mL (ref <0.031)

## 2015-08-21 MED ORDER — NITROGLYCERIN 0.4 MG SL SUBL
0.4000 mg | SUBLINGUAL_TABLET | SUBLINGUAL | Status: DC | PRN
  Administered 2015-08-21: 0.4 mg via SUBLINGUAL
  Filled 2015-08-21: qty 1

## 2015-08-21 NOTE — ED Provider Notes (Signed)
[facility] Emergency Department Provider Note  ____________________________________________  Time seen: 12:15 AM  I have reviewed the triage vital signs and the nursing notes.   HISTORY  Chief Complaint Chest Pain    HPI Carol [name] is a 60 y.o. female presents with intermittent chest pain since 6 PM yesterday evening current pain score at 7 out of 10. Patient denies any dyspnea no nausea no vomiting no diaphoresis. Of note patient has a history of myocardial infarction as well as angina for which she is prescribed nitroglycerin daily I Dr. Ubaldo Glassing     Past Medical History  Diagnosis Date  . Coronary artery disease   . Hypertension   . Bipolar affective disorder ([place])   . Hyperlipidemia   . Hypothyroid   . Myocardial infarction ([place])   . Stroke [facility])     Patient Active Problem List   Diagnosis Date Noted  . Chest pain 05/12/2015  . Right facial numbness 03/30/2015  . Lithium intoxication 02/27/2015  . Bipolar disorder ([place]) 02/27/2015  . CVA (cerebral infarction) 02/26/2015  . Unstable angina ([place]) 02/03/2015    Past Surgical History  Procedure Laterality Date  . Appendectomy    . Hysterotomy      Current Outpatient Rx  Name  Route  Sig  Dispense  Refill  . albuterol (PROVENTIL HFA;VENTOLIN HFA) 108 (90 BASE) MCG/ACT inhaler   Inhalation   Inhale 2 puffs into the lungs every 6 (six) hours as needed for wheezing or shortness of breath.         Marland Kitchen amitriptyline (ELAVIL) 50 MG tablet   Oral   Take 50 mg by mouth at bedtime.         . budesonide-formoterol (SYMBICORT) 160-4.5 MCG/ACT inhaler   Inhalation   Inhale 2 puffs into the lungs 2 (two) times daily.          . cetirizine (ZYRTEC) 10 MG tablet   Oral   Take 10 mg by mouth daily.         . citalopram (CELEXA) 40 MG tablet   Oral   Take 0.5 tablets (20 mg total) by mouth daily.   1 tablet   0     Pt may take half of her 40mg  , this is due to conc ...   .  clopidogrel (PLAVIX) 75 MG tablet   Oral   Take 75 mg by mouth every evening.          . ferrous sulfate 325 (65 FE) MG tablet   Oral   Take 325 mg by mouth every 7 (seven) days. Pt takes on Saturday.         . isosorbide mononitrate (IMDUR) 30 MG 24 hr tablet   Oral   Take 30 mg by mouth daily.         Marland Kitchen levothyroxine (SYNTHROID, LEVOTHROID) 100 MCG tablet   Oral   Take 100 mcg by mouth daily.         Marland Kitchen lisinopril (PRINIVIL,ZESTRIL) 5 MG tablet   Oral   Take 5 mg by mouth daily.         Marland Kitchen lubiprostone (AMITIZA) 8 MCG capsule   Oral   Take 8 mcg by mouth 2 (two) times daily as needed for constipation.         . metoprolol tartrate (LOPRESSOR) 25 MG tablet   Oral   Take 25 mg by mouth 2 (two) times daily.         . QUEtiapine (  SEROQUEL) 400 MG tablet   Oral   Take 400 mg by mouth at bedtime.         . simvastatin (ZOCOR) 20 MG tablet   Oral   Take 20 mg by mouth at bedtime.         . naproxen (NAPROSYN) 500 MG tablet   Oral   Take 1 tablet (500 mg total) by mouth 2 (two) times daily with a meal. Patient not taking: Reported on 08/21/2015   20 tablet   0   . traMADol (ULTRAM) 50 MG tablet   Oral   Take 1 tablet (50 mg total) by mouth every 6 (six) hours as needed. Patient not taking: Reported on 08/21/2015   10 tablet   0   . traMADol (ULTRAM) 50 MG tablet   Oral   Take 1 tablet (50 mg total) by mouth every 6 (six) hours as needed for moderate pain. Patient not taking: Reported on 08/21/2015   12 tablet   0     Allergies Morphine; Pregabalin; Rofecoxib; Sulfamethoxazole-trimethoprim; and Asa  Family History  Problem Relation Age of Onset  . CAD      Social History Social History  Substance Use Topics  . Smoking status: Former Smoker -- 1.00 packs/day for 40 years  . Smokeless tobacco: Never Used  . Alcohol Use: No    Review of Systems  Constitutional: Negative for fever. Eyes: Negative for visual changes. ENT: Negative for sore  throat. Cardiovascular: Positive for chest pain. Respiratory: Negative for shortness of breath. Gastrointestinal: Negative for abdominal pain, vomiting and diarrhea. Genitourinary: Negative for dysuria. Musculoskeletal: Negative for back pain. Skin: Negative for rash. Neurological: Negative for headaches, focal weakness or numbness.   10-point ROS otherwise negative.  ____________________________________________   PHYSICAL EXAM:  VITAL SIGNS: ED Triage Vitals  Enc Vitals Group     BP 08/20/15 1840 163/59 mmHg     Pulse Rate 08/20/15 1840 77     Resp 08/20/15 1840 18     Temp 08/20/15 1840 97.9 F (36.6 C)     Temp Source 08/20/15 1840 Oral     SpO2 08/20/15 1840 95 %     Weight 08/20/15 1840 155 lb (70.308 kg)     Height 08/20/15 1840 5' (1.524 m)     Head Cir --      Peak Flow --      Pain Score 08/20/15 1840 10     Pain Loc --      Pain Edu? --      Excl. in [place]? --      Constitutional: Alert and oriented. Well appearing and in no distress. Eyes: Conjunctivae are normal. PERRL. Normal extraocular movements. ENT   Head: Normocephalic and atraumatic.   Nose: No congestion/rhinnorhea.   Mouth/Throat: Mucous membranes are moist.   Neck: No stridor. Hematological/Lymphatic/Immunilogical: No cervical lymphadenopathy. Cardiovascular: Normal rate, regular rhythm. Normal and symmetric distal pulses are present in all extremities. No murmurs, rubs, or gallops. Respiratory: Normal respiratory effort without tachypnea nor retractions. Breath sounds are clear and equal bilaterally. No wheezes/rales/rhonchi. Gastrointestinal: Soft and nontender. No distention. There is no CVA tenderness. Genitourinary: deferred Musculoskeletal: Nontender with normal range of motion in all extremities. No joint effusions.  No lower extremity tenderness nor edema. Neurologic:  Normal speech and language. No gross focal neurologic deficits are appreciated. Speech is normal.  Skin:   Skin is warm, dry and intact. No rash noted. Psychiatric: Mood and affect are normal. Speech and behavior are  normal. Patient exhibits appropriate insight and judgment.  ____________________________________________    LABS (pertinent positives/negatives)  Labs Reviewed  BASIC METABOLIC PANEL - Abnormal; Notable for the following:    Glucose, Bld 136 (*)    All other components within normal limits  CBC  TROPONIN I  TROPONIN I     ____________________________________________   EKG  ED ECG REPORT I, Hala Narula, [place], the attending physician, personally viewed and interpreted this ECG.   Date: 08/21/2015  EKG Time: 6:41 PM  Rate: 74  Rhythm: Normal sinus rhythm  Axis: None  Intervals:normal  ST&T Change: none   ____________________________________________    RADIOLOGY     DG Chest 2 View (Final result) Result time: 08/20/15 19:05:53   Final result by Rad Results In Interface (08/20/15 19:05:53)   Narrative:   CLINICAL DATA: Increasing central chest pain. Recent episode of bronchitis.  EXAM: CHEST 2 VIEW  COMPARISON: 05/12/2015  FINDINGS: Few chronic linear densities in the left chest may represent scarring. No airspace disease. There is a rounded nodular density in the left lower chest which was present on the chest radiograph from 02/03/2015. This structure roughly measures 9 mm. Heart and mediastinum are within normal limits. Trachea is midline. No pleural effusions. No acute bone abnormality.  IMPRESSION: No active cardiopulmonary disease.  Indeterminate nodular structure in the left lower chest. This could represent a nipple shadow. This could be confirmed with follow up images using nipple markers.   Electronically Signed By: Markus Daft M.D. On: 08/20/2015 19:05      INITIAL IMPRESSION / ASSESSMENT AND PLAN / ED COURSE  Pertinent labs & imaging results that were available during my care of the patient were reviewed by me and  considered in my medical decision making (see chart for details). EKG cardiac enzymes unremarkable Patient reevaluated stating that all pain resolved no dyspnea or any other complaints at this time. See history and physical exam consistent with known angina. Patient is advised to follow-up with Dr. Ubaldo Glassing today  ____________________________________________   FINAL CLINICAL IMPRESSION(S) / ED DIAGNOSES  Final diagnoses:  None      Gregor Hams, MD 08/21/15 [phone]

## 2015-08-21 NOTE — Discharge Instructions (Signed)
Angina Pectoris  Angina pectoris, often called angina, is extreme discomfort in the chest, neck, or arm. This is caused by a lack of blood in the middle and thickest layer of the heart wall (myocardium). There are four types of angina:  · Stable angina. Stable angina usually occurs in episodes of predictable frequency and duration. It is usually brought on by physical activity, stress, or excitement. Stable angina usually lasts a few minutes and can often be relieved by a medicine that you place under your tongue. This medicine is called sublingual nitroglycerin.  · Unstable angina. Unstable angina can occur even when you are doing little or no physical activity. It can even occur while you are sleeping or when you are at rest. It can suddenly increase in severity or frequency. It may not be relieved by sublingual nitroglycerin, and it can last up to 30 minutes.  · Microvascular angina. This type of angina is caused by a disorder of tiny blood vessels called arterioles. Microvascular angina is more common in women. The pain may be more severe and last longer than other types of angina pectoris.  · Prinzmetal or variant angina. This type of angina pectoris is rare and usually occurs when you are doing little or no physical activity. It especially occurs in the early morning hours.  CAUSES  Atherosclerosis is the cause of angina. This is the buildup of fat and cholesterol (plaque) on the inside of the arteries. Over time, the plaque may narrow or block the artery, and this will lessen blood flow to the heart. Plaque can also become weak and break off within a coronary artery to form a clot and cause a sudden blockage.  RISK FACTORS  Risk factors common to both men and women include:  · High cholesterol levels.  · High blood pressure (hypertension).  · Tobacco use.  · Diabetes.  · Family history of angina.  · Obesity.  · Lack of exercise.  · A diet high in saturated fats.  Women are at greater risk for angina if they  are:  · Over age 55.  · Postmenopausal.  SYMPTOMS  Many people do not experience any symptoms during the early stages of angina. As the condition progresses, symptoms common to both men and women may include:  · Chest pain.    The pain can be described as a crushing or squeezing in the chest, or a tightness, pressure, fullness, or heaviness in the chest.    The pain can last more than a few minutes, or it can stop and recur.  · Pain in the arms, neck, jaw, or back.  · Unexplained heartburn or indigestion.  · Shortness of breath.  · Nausea.  · Sudden cold sweats.  · Sudden light-headedness.  Many women have chest discomfort and some of the other symptoms. However, women often have different (atypical) symptoms, such as:   · Fatigue.  · Unexplained feelings of nervousness or anxiety.  · Unexplained weakness.  · Dizziness or fainting.  Sometimes, women may have angina without any symptoms.  DIAGNOSIS   Tests to diagnose angina may include:  · ECG (electrocardiogram).  · Exercise stress test. This looks for signs of blockage when the heart is being exercised.  · Pharmacologic stress test. This test looks for signs of blockage when the heart is being stressed with a medicine.  · Blood tests.  · Coronary angiogram. This is a procedure to look at the coronary arteries to see if there is any blockage.    TREATMENT   The treatment of angina may include the following:  · Healthy behavioral changes to reduce or control risk factors.  · Medicine.  · Coronary stenting. A stent helps to keep an artery open.  · Coronary angioplasty. This procedure widens a narrowed or blocked artery.  · Coronary artery bypass surgery. This will allow your blood to pass the blockage (bypass) to reach your heart.  HOME CARE INSTRUCTIONS   · Take medicines only as directed by your health care provider.  · Do not take the following medicines unless your health care provider approves:    Nonsteroidal anti-inflammatory drugs (NSAIDs), such as ibuprofen,  naproxen, or celecoxib.    Vitamin supplements that contain vitamin A, vitamin E, or both.    Hormone replacement therapy that contains estrogen with or without progestin.  · Manage other health conditions such as hypertension and diabetes as directed by your health care provider.  · Follow a heart-healthy diet. A dietitian can help to educate you about healthy food options and changes.  · Use healthy cooking methods such as roasting, grilling, broiling, baking, poaching, steaming, or stir-frying. Talk to a dietitian to learn more about healthy cooking methods.  · Follow an exercise program approved by your health care provider.  · Maintain a healthy weight. Lose weight as approved by your health care provider.  · Plan rest periods when fatigued.  · Learn to manage stress.  · Do not use any tobacco products, including cigarettes, chewing tobacco, or electronic cigarettes. If you need help quitting, ask your health care provider.  · If you drink alcohol, and your health care provider approves, limit your alcohol intake to no more than 1 drink per day. One drink equals 12 ounces of beer, 5 ounces of wine, or 1½ ounces of hard liquor.  · Stop illegal drug use.  · Keep all follow-up visits as directed by your health care provider. This is important.  SEEK IMMEDIATE MEDICAL CARE IF:   · You have pain in your chest, neck, arm, jaw, stomach, or back that lasts more than a few minutes, is recurring, or is unrelieved by taking sublingual nitroglycerin.  · You have profuse sweating without cause.  · You have unexplained:    Heartburn or indigestion.    Shortness of breath or difficulty breathing.    Nausea or vomiting.    Fatigue.    Feelings of nervousness or anxiety.    Weakness.    Diarrhea.  · You have sudden light-headedness or dizziness.  · You faint.  These symptoms may represent a serious problem that is an emergency. Do not wait to see if the symptoms will go away. Get medical help right away. Call your local  emergency services (911 in the [place] Do not drive yourself to the hospital.     This information is not intended to replace advice given to you by your health care provider. Make sure you discuss any questions you have with your health care provider.     Document Released: 08/02/2005 Document Revised: 08/23/2014 Document Reviewed: 12/04/2013  Elsevier Interactive Patient Education ©2016 [employer]

## 2015-08-29 DIAGNOSIS — F410 Panic disorder [episodic paroxysmal anxiety] without agoraphobia: Secondary | ICD-10-CM | POA: Diagnosis not present

## 2015-09-24 ENCOUNTER — Other Ambulatory Visit: Payer: Self-pay | Admitting: Cardiology

## 2015-09-26 ENCOUNTER — Ambulatory Visit
Admission: RE | Admit: 2015-09-26 | Discharge: 2015-09-26 | Disposition: A | Discharge status: Home / Self Care | Payer: Commercial Managed Care - HMO | Source: Ambulatory Visit | Attending: Cardiology | Admitting: Cardiology

## 2015-09-26 ENCOUNTER — Encounter: Payer: Self-pay | Admitting: *Deleted

## 2015-09-26 ENCOUNTER — Encounter
Admission: RE | Disposition: A | Discharge status: Home / Self Care | Payer: Self-pay | Source: Ambulatory Visit | Attending: Cardiology

## 2015-09-26 DIAGNOSIS — F17200 Nicotine dependence, unspecified, uncomplicated: Secondary | ICD-10-CM | POA: Diagnosis not present

## 2015-09-26 DIAGNOSIS — R251 Tremor, unspecified: Secondary | ICD-10-CM | POA: Insufficient documentation

## 2015-09-26 DIAGNOSIS — E079 Disorder of thyroid, unspecified: Secondary | ICD-10-CM | POA: Diagnosis not present

## 2015-09-26 DIAGNOSIS — D649 Anemia, unspecified: Secondary | ICD-10-CM | POA: Diagnosis not present

## 2015-09-26 DIAGNOSIS — Z7951 Long term (current) use of inhaled steroids: Secondary | ICD-10-CM | POA: Diagnosis not present

## 2015-09-26 DIAGNOSIS — F329 Major depressive disorder, single episode, unspecified: Secondary | ICD-10-CM | POA: Insufficient documentation

## 2015-09-26 DIAGNOSIS — Z79899 Other long term (current) drug therapy: Secondary | ICD-10-CM | POA: Diagnosis not present

## 2015-09-26 DIAGNOSIS — I10 Essential (primary) hypertension: Secondary | ICD-10-CM | POA: Insufficient documentation

## 2015-09-26 DIAGNOSIS — I25110 Atherosclerotic heart disease of native coronary artery with unstable angina pectoris: Secondary | ICD-10-CM | POA: Diagnosis not present

## 2015-09-26 DIAGNOSIS — I252 Old myocardial infarction: Secondary | ICD-10-CM | POA: Diagnosis not present

## 2015-09-26 DIAGNOSIS — E785 Hyperlipidemia, unspecified: Secondary | ICD-10-CM | POA: Diagnosis not present

## 2015-09-26 DIAGNOSIS — Z8673 Personal history of transient ischemic attack (TIA), and cerebral infarction without residual deficits: Secondary | ICD-10-CM | POA: Insufficient documentation

## 2015-09-26 DIAGNOSIS — I200 Unstable angina: Secondary | ICD-10-CM | POA: Diagnosis present

## 2015-09-26 HISTORY — PX: CARDIAC CATHETERIZATION: SHX172

## 2015-09-26 SURGERY — LEFT HEART CATH AND CORONARY ANGIOGRAPHY
Anesthesia: Moderate Sedation | Laterality: Left

## 2015-09-26 MED ORDER — SODIUM CHLORIDE 0.9% FLUSH
3.0000 mL | Freq: Two times a day (BID) | INTRAVENOUS | Status: DC
  Administered 2015-09-26: 10 mL via INTRAVENOUS

## 2015-09-26 MED ORDER — SODIUM CHLORIDE 0.9 % IV SOLN: 250.0000 mL | INTRAVENOUS | Status: DC | PRN

## 2015-09-26 MED ORDER — IOHEXOL 300 MG/ML  SOLN
INTRAMUSCULAR | Status: DC | PRN
  Administered 2015-09-26: 85 mL via INTRA_ARTERIAL

## 2015-09-26 MED ORDER — FENTANYL CITRATE (PF) 100 MCG/2ML IJ SOLN
INTRAMUSCULAR | Status: DC | PRN
  Administered 2015-09-26: 25 ug via INTRAVENOUS

## 2015-09-26 MED ORDER — MIDAZOLAM HCL 2 MG/2ML IJ SOLN
INTRAMUSCULAR | Status: DC | PRN
  Administered 2015-09-26: 1 mg via INTRAVENOUS

## 2015-09-26 MED ORDER — MIDAZOLAM HCL 2 MG/2ML IJ SOLN
INTRAMUSCULAR | Status: AC
  Filled 2015-09-26: qty 2

## 2015-09-26 MED ORDER — HEPARIN (PORCINE) IN NACL 2-0.9 UNIT/ML-% IJ SOLN
INTRAMUSCULAR | Status: AC
  Filled 2015-09-26: qty 500

## 2015-09-26 MED ORDER — ASPIRIN 81 MG PO CHEW: 81.0000 mg | CHEWABLE_TABLET | ORAL | Status: DC

## 2015-09-26 MED ORDER — SODIUM CHLORIDE 0.9 % WEIGHT BASED INFUSION: 1.0000 mL/kg/h | INTRAVENOUS | Status: DC

## 2015-09-26 MED ORDER — FENTANYL CITRATE (PF) 100 MCG/2ML IJ SOLN
INTRAMUSCULAR | Status: AC
  Filled 2015-09-26: qty 2

## 2015-09-26 MED ORDER — SODIUM CHLORIDE 0.9 % WEIGHT BASED INFUSION
3.0000 mL/kg/h | INTRAVENOUS | Status: DC
  Administered 2015-09-26: 3 mL/kg/h via INTRAVENOUS

## 2015-09-26 SURGICAL SUPPLY — 9 items
CATH INFINITI 5FR ANG PIGTAIL (CATHETERS) ×2 IMPLANT
CATH INFINITI 5FR JL4 (CATHETERS) ×2 IMPLANT
CATH INFINITI JR4 5F (CATHETERS) ×2 IMPLANT
DEVICE CLOSURE MYNXGRIP 5F (Vascular Products) ×2 IMPLANT
KIT MANI 3VAL PERCEP (MISCELLANEOUS) ×2 IMPLANT
NEEDLE PERC 18GX7CM (NEEDLE) ×2 IMPLANT
PACK CARDIAC CATH (CUSTOM PROCEDURE TRAY) ×2 IMPLANT
SHEATH PINNACLE 5F 10CM (SHEATH) ×2 IMPLANT
WIRE EMERALD 3MM-J .035X150CM (WIRE) ×2 IMPLANT

## 2015-09-26 NOTE — H&P (Signed)
Chief Complaint: Chief Complaint  Patient presents with  . discuss having heart cath  pt continues to have chest pain and would like to proceed with a cath since all other testing normal  Date of Service: 09/24/2015 Date of Birth: Mar 30, 1956 PCP: ERIC M TURNER, PA  History of Present Illness: Carol [name] is a 60 y.o.female patient who has a history of coronary artery disease with a chronically occluded right Coronary artery, hypertension hyperlipidemia returns for follow-up visit. Continues to have chest pain on occasion. She has been in the emergency room for evaluation of this. She ruled out for myocardial infarction with an unremarkable electrocardiogram. She denies orthopnea or PND syncope or presyncope. She states she has midsternal chest pain with activity. She had an electrocardiogram which revealed no ischemia. Functional study revealed no evidence of ischemia. Her symptoms persist. She continues to have chest pain with activity and occasionally at rest. It is unstable to the point where she is unable to carry out any daily activities without having pain. She has a history of a chronically occluded right coronary artery. Will need to evaluate for evidence of coronary artery disease as etiology of her symptoms. She is on antianginal therapy including long-acting nitrates and beta-blockers. She is on simvastatin for hyperlipidemia and lisinopril for afterload reduction. Past Medical and Surgical History  Past Medical History Past Medical History  Diagnosis Date  . Anemia, unspecified  . Arthritis  . CAD (coronary artery disease)  occluded RCA treated medically  . Depression, unspecified  . Hyperlipidemia, unspecified  . Hypertension  . Migraine headache  . Myocardial infarction (CMS-HCC)  . 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 Stroke (CMS-HCC)  . Thyroid disease  . Tremor   Past Surgical History She has no past surgical history on file.   Medications and  Allergies  Current Medications  Current Outpatient Prescriptions  Medication Sig Dispense Refill  . albuterol 90 mcg/actuation inhaler Inhale 2 inhalations into the lungs every 6 (six) hours as needed for Wheezing.  Marland Kitchen amitriptyline (ELAVIL) 50 MG tablet Take 50 mg by mouth nightly.  . budesonide-formoterol (SYMBICORT) 160-4.5 mcg/actuation inhaler Inhale 2 inhalations into the lungs 2 (two) times daily.  . cetirizine (ZYRTEC) 10 MG tablet Take by mouth.  . citalopram (CELEXA) 40 MG tablet Take 40 mg by mouth once daily.  . clopidogrel (PLAVIX) 75 mg tablet Take 1 tablet (75 mg total) by mouth once daily. 90 tablet 3  . isosorbide mononitrate (IMDUR) 30 MG ER tablet Take 1 tablet (30 mg total) by mouth once daily. 90 tablet 3  . levothyroxine (SYNTHROID, LEVOTHROID) 100 MCG tablet Take 100 mcg by mouth once daily. Take on an empty stomach with a glass of water at least 30-60 minutes before breakfast.  . lisinopril (PRINIVIL,ZESTRIL) 5 MG tablet Take 5 mg by mouth once daily.  Marland Kitchen lubiprostone (AMITIZA) 8 MCG capsule Take by mouth.  . metoprolol tartrate (LOPRESSOR) 25 MG tablet Take 1 tablet (25 mg total) by mouth 2 (two) times daily. 60 tablet 11  . simvastatin (ZOCOR) 20 MG tablet Take 20 mg by mouth nightly.   No current facility-administered medications for this visit.   Allergies: Aspir-low [aspirin]; Lyrica [pregabalin]; Morphine; Septra [sulfamethoxazole-trimethoprim]; and Vioxx [rofecoxib]  Social and Family History  Social History reports that she has been smoking Cigarettes. She has been smoking about 1.00 pack per day. She has never used smokeless tobacco. She reports that she does not drink alcohol or use illicit drugs.  Family History History reviewed. No pertinent family history.  Review of Systems  Review of Systems  Constitutional: Negative for chills, diaphoresis, fever, malaise/fatigue and weight loss.  HENT: Negative for congestion, ear discharge, hearing loss and  tinnitus.  Eyes: Negative for blurred vision.  Respiratory: Negative for cough, hemoptysis, sputum production, shortness of breath and wheezing.  Cardiovascular: Positive for chest pain. Negative for palpitations, orthopnea, claudication, leg swelling and PND.  Gastrointestinal: Negative for abdominal pain, blood in stool, constipation, diarrhea, heartburn, melena, nausea and vomiting.  Genitourinary: Negative for dysuria, frequency, hematuria and urgency.  Musculoskeletal: Negative for back pain, falls, joint pain and myalgias.  Skin: Negative for itching and rash.  Neurological: Negative for dizziness, tingling, focal weakness, loss of consciousness, weakness and headaches.  Endo/Heme/Allergies: Negative for polydipsia. Does not bruise/bleed easily.  Psychiatric/Behavioral: Negative for depression, memory loss and substance abuse. The patient is not nervous/anxious.    Physical Examination   Vitals: Visit Vitals  . BP 120/74 (BP Location: Left upper arm, Patient Position: Sitting, BP Cuff Size: Adult)  . Pulse 76  . Resp 12  . Ht 152.4 cm (5')  . Wt 67.6 kg (149 lb)  . BMI 29.1 kg/m2   Ht:152.4 cm (5') Wt:67.6 kg (149 lb) [id number] surface area is 1.69 meters squared. Body mass index is 29.1 kg/(m^2).  Wt Readings from Last 3 Encounters:  09/24/15 67.6 kg (149 lb)  09/08/15 69.4 kg (153 lb)  06/04/15 71.7 kg (158 lb)   BP Readings from Last 3 Encounters:  09/24/15 120/74  09/08/15 130/82  06/04/15 112/70   General: Caucasian female in no acute distress LUNGS Breath Sounds: Normal Percussion: Normal  CARDIOVASCULAR JVP CV wave: no HJR: no Elevation at 90 degrees: None Carotid Pulse: normal pulsation bilaterally Bruit: None Apex: apical impulse normal  Auscultation Rhythm: normal sinus rhythm S1: normal S2: normal Clicks: no Rub: no Murmurs: no murmurs  Gallop: None ABDOMEN Liver enlargement: no Pulsatile aorta: no Ascites: no Bruits:  no  EXTREMITIES Clubbing: no Edema: trace to 1+ bilateral pedal edema Pulses: peripheral pulses symmetrical Femoral Bruits: no Amputation: no SKIN Rash: no Cyanosis: no Embolic phemonenon: no Bruising: no NEURO Alert and Oriented to person, place and time: yes Non focal: yes    Assessment and Plan   60 y.o. female with  ICD-10-CM ICD-9-CM  1. Hypertension, essential-fairly well controlled with metoprolol lisinopril and isosorbide. DASH diet is also recommended I10 401.9  2. Coronary artery disease involving native coronary artery of native heart without angina pectoris-still with progressive angina. Had an emergency room visit where she ruled out. We will continue with long-acting nitrates dual antiplatelet therapy metoprolol and as needed nitrates. We proceed with cardiac catheterization. Functional study showed no reversible ischemia but patient is on aggressive medical therapy and persistent symptoms. Risk and benefits of left heart catheter explained the patient and she agrees to proceed. I25.10 414.01  3. Mixed hyperlipidemia-Will continue with simvastatin at 20 mg daily. LDL goal less than 100. Low-fat diet recommended E78.2 272.2  4. Essential hypertension-as per above. DASH diet I10 401.9   Return in about 2 weeks (around 10/08/2015).  These notes generated with voice recognition software. I apologize for typographical errors.  Sydnee Levans, MD

## 2015-09-29 ENCOUNTER — Encounter: Payer: Self-pay | Admitting: Cardiology

## 2015-12-13 ENCOUNTER — Emergency Department
Admission: EM | Admit: 2015-12-13 | Discharge: 2015-12-13 | Disposition: A | Discharge status: Home / Self Care | Payer: Commercial Managed Care - HMO | Attending: Emergency Medicine | Admitting: Emergency Medicine

## 2015-12-13 DIAGNOSIS — Z79899 Other long term (current) drug therapy: Secondary | ICD-10-CM | POA: Insufficient documentation

## 2015-12-13 DIAGNOSIS — R21 Rash and other nonspecific skin eruption: Secondary | ICD-10-CM

## 2015-12-13 DIAGNOSIS — I252 Old myocardial infarction: Secondary | ICD-10-CM | POA: Diagnosis not present

## 2015-12-13 DIAGNOSIS — I10 Essential (primary) hypertension: Secondary | ICD-10-CM | POA: Insufficient documentation

## 2015-12-13 DIAGNOSIS — I2510 Atherosclerotic heart disease of native coronary artery without angina pectoris: Secondary | ICD-10-CM | POA: Insufficient documentation

## 2015-12-13 DIAGNOSIS — Z8673 Personal history of transient ischemic attack (TIA), and cerebral infarction without residual deficits: Secondary | ICD-10-CM | POA: Insufficient documentation

## 2015-12-13 DIAGNOSIS — E039 Hypothyroidism, unspecified: Secondary | ICD-10-CM | POA: Insufficient documentation

## 2015-12-13 DIAGNOSIS — E785 Hyperlipidemia, unspecified: Secondary | ICD-10-CM | POA: Diagnosis not present

## 2015-12-13 DIAGNOSIS — Z87891 Personal history of nicotine dependence: Secondary | ICD-10-CM | POA: Insufficient documentation

## 2015-12-13 DIAGNOSIS — F319 Bipolar disorder, unspecified: Secondary | ICD-10-CM | POA: Insufficient documentation

## 2015-12-13 MED ORDER — DOXYCYCLINE HYCLATE 100 MG PO TABS
100.0000 mg | ORAL_TABLET | Freq: Once | ORAL | Status: AC
  Administered 2015-12-13: 100 mg via ORAL
  Filled 2015-12-13: qty 1

## 2015-12-13 MED ORDER — DOXYCYCLINE HYCLATE 100 MG PO CAPS: 100.0000 mg | ORAL_CAPSULE | Freq: Two times a day (BID) | ORAL | Status: DC

## 2015-12-13 NOTE — ED Notes (Signed)
Pt states taking bactrim for ear infection and now has small areas of redness that she believes is an allergic rxt. Pt states the areas burn. One area is on the tip of her finger, a few on her thighs.

## 2015-12-13 NOTE — ED Notes (Addendum)
Pt reports started on Sulfamethoxazole-trimethoprim antibiotic for sinus infection on Wednesday evening. Noticed a few bumps on her body on Thursday that turned in to larger areas and blistered,. Pt reports this afternoon she started feeling congested in her chest. Pt talking in full and complete sentences with no difficulty at this time. Pt noted to have allergy listed in her chart that she is allergic to this antibiotic.

## 2015-12-13 NOTE — ED Provider Notes (Signed)
[facility] Emergency Department Provider Note  ____________________________________________  Time seen: Approximately 11:19 PM  I have reviewed the triage vital signs and the nursing notes.   HISTORY  Chief Complaint Allergic Reaction    HPI Carol [name] is a 60 y.o. female who presents to the emergency department for evaluation of rash. She states that she was taking Bactrim for an ear infection and now has blotchy areas of redness over her legs and left index finger. She states that she thinks she is allergic to the Bactrim. She does not recall the reaction that she had to the Bactrim in the past. She does state that she has been outside, but is not aware of any bites or stings.   Past Medical History  Diagnosis Date  . Coronary artery disease   . Hypertension   . Bipolar affective disorder ([place])   . Hyperlipidemia   . Hypothyroid   . Myocardial infarction ([place])   . Stroke [facility])     Patient Active Problem List   Diagnosis Date Noted  . Chest pain 05/12/2015  . Right facial numbness 03/30/2015  . Lithium intoxication 02/27/2015  . Bipolar disorder ([place]) 02/27/2015  . CVA (cerebral infarction) 02/26/2015  . Unstable angina ([place]) 02/03/2015    Past Surgical History  Procedure Laterality Date  . Appendectomy    . Hysterotomy    . Cardiac catheterization Left 09/26/2015    Procedure: Left Heart Cath and Coronary Angiography;  Surgeon: Teodoro Spray, MD;  Location: [place] [facility];  Service: Cardiovascular;  Laterality: Left;    Current Outpatient Rx  Name  Route  Sig  Dispense  Refill  . albuterol (PROVENTIL HFA;VENTOLIN HFA) 108 (90 BASE) MCG/ACT inhaler   Inhalation   Inhale 2 puffs into the lungs every 6 (six) hours as needed for wheezing or shortness of breath.         . budesonide-formoterol (SYMBICORT) 160-4.5 MCG/ACT inhaler   Inhalation   Inhale 2 puffs into the lungs 2 (two) times daily.          . cetirizine (ZYRTEC)  10 MG tablet   Oral   Take 10 mg by mouth daily.         . citalopram (CELEXA) 40 MG tablet   Oral   Take 0.5 tablets (20 mg total) by mouth daily.   1 tablet   0     Pt may take half of her 40mg  , this is due to conc ...   . clopidogrel (PLAVIX) 75 MG tablet   Oral   Take 75 mg by mouth every evening.          Marland Kitchen doxycycline (VIBRAMYCIN) 100 MG capsule   Oral   Take 1 capsule (100 mg total) by mouth 2 (two) times daily.   20 capsule   0   . ferrous sulfate 325 (65 FE) MG tablet   Oral   Take 325 mg by mouth every 7 (seven) days. Pt takes on Saturday.         . isosorbide mononitrate (IMDUR) 30 MG 24 hr tablet   Oral   Take 30 mg by mouth daily.         Marland Kitchen levothyroxine (SYNTHROID, LEVOTHROID) 100 MCG tablet   Oral   Take 100 mcg by mouth daily.         Marland Kitchen lisinopril (PRINIVIL,ZESTRIL) 5 MG tablet   Oral   Take 5 mg by mouth daily.         Marland Kitchen  lubiprostone (AMITIZA) 8 MCG capsule   Oral   Take 8 mcg by mouth 2 (two) times daily as needed for constipation.         . metoprolol tartrate (LOPRESSOR) 25 MG tablet   Oral   Take 25 mg by mouth 2 (two) times daily.         . simvastatin (ZOCOR) 20 MG tablet   Oral   Take 20 mg by mouth at bedtime.         . traMADol (ULTRAM) 50 MG tablet   Oral   Take 1 tablet (50 mg total) by mouth every 6 (six) hours as needed.   10 tablet   0   . traMADol (ULTRAM) 50 MG tablet   Oral   Take 1 tablet (50 mg total) by mouth every 6 (six) hours as needed for moderate pain. Patient not taking: Reported on 08/21/2015   12 tablet   0     Allergies Morphine; Pregabalin; Rofecoxib; Sulfamethoxazole-trimethoprim; and Asa  Family History  Problem Relation Age of Onset  . CAD      Social History Social History  Substance Use Topics  . Smoking status: Former Smoker -- 1.00 packs/day for 40 years  . Smokeless tobacco: Never Used  . Alcohol Use: No    Review of Systems   Constitutional: No  fever/chills Eyes: No visual changes. ENT: No congestion or rhinorrhea Cardiovascular: Denies chest pain. Respiratory: Denies shortness of breath. Gastrointestinal: No abdominal pain.  No nausea, no vomiting.  No diarrhea.  No constipation. Genitourinary: Negative for dysuria. Musculoskeletal: Negative for back pain. Skin:Positive for rash. Neurological: Negative for headaches, focal weakness or numbness. ___________________________________________   PHYSICAL EXAM:  VITAL SIGNS: ED Triage Vitals  Enc Vitals Group     BP 12/13/15 2210 132/62 mmHg     Pulse Rate 12/13/15 2210 54     Resp 12/13/15 2210 16     Temp 12/13/15 2210 98.4 F (36.9 C)     Temp Source 12/13/15 2210 Oral     SpO2 12/13/15 2210 95 %     Weight 12/13/15 2210 143 lb (64.864 kg)     Height 12/13/15 2210 5' (1.524 m)     Head Cir --      Peak Flow --      Pain Score 12/13/15 2223 7     Pain Loc --      Pain Edu? --      Excl. in [place]? --     Constitutional: Alert and oriented. Well appearing and in no acute distress. Eyes: Conjunctivae are normal. PERRL. EOMI. Head: Atraumatic. Nose: No congestion/rhinnorhea. Mouth/Throat: Mucous membranes are moist.  Oropharynx non-erythematous. No oral lesions. Neck: No stridor. Respiratory: Normal respiratory effort.  No retractions. Lungs CTAB. Skin:   Isolated, erythematous macular lesions noted to the left upper thigh, right calf and left index finger. Psychiatric: Mood and affect are normal. Speech and behavior are normal.  ____________________________________________   LABS (all labs ordered are listed, but only abnormal results are displayed)  Labs Reviewed - No data to display ____________________________________________  EKG   ____________________________________________  RADIOLOGY   ____________________________________________   PROCEDURES  Procedure(s) performed: None ____________________________________________   INITIAL IMPRESSION /  ASSESSMENT AND PLAN / ED COURSE  Pertinent labs & imaging results that were available during my care of the patient were reviewed by me and considered in my medical decision making (see chart for details).  Rash is not consistent with drug allergy. The lesions are  more consistent with insect bites. The lesion on the index finger is concerning for an early cellulitis. The patient was advised to stop taking the Bactrim, as it is listed as an allergy in her past medical history. She will take doxycycline, which should cover her for the otitis media that she was initially prescribed the antibiotic for as well as cellulitis. She was instructed to follow-up with her primary care provider if her symptoms are not improving over the next 2 days. She was instructed to return to the emergency department if the finger becomes more inflamed, red, painful, or if she notices red streaks in the hand. The patient verbalized understanding and was agreeable to the plan of care. ____________________________________________   FINAL CLINICAL IMPRESSION(S) / ED DIAGNOSES  Final diagnoses:  Rash and nonspecific skin eruption       Victorino Dike, FNP 12/13/15 [id number]  Lavonia Drafts, MD 12/14/15 [phone]

## 2015-12-30 ENCOUNTER — Emergency Department
Admission: EM | Admit: 2015-12-30 | Discharge: 2015-12-30 | Disposition: A | Discharge status: Home / Self Care | Payer: Commercial Managed Care - HMO | Attending: Emergency Medicine | Admitting: Emergency Medicine

## 2015-12-30 DIAGNOSIS — E785 Hyperlipidemia, unspecified: Secondary | ICD-10-CM | POA: Diagnosis not present

## 2015-12-30 DIAGNOSIS — E039 Hypothyroidism, unspecified: Secondary | ICD-10-CM | POA: Insufficient documentation

## 2015-12-30 DIAGNOSIS — Z8673 Personal history of transient ischemic attack (TIA), and cerebral infarction without residual deficits: Secondary | ICD-10-CM | POA: Diagnosis not present

## 2015-12-30 DIAGNOSIS — Z7951 Long term (current) use of inhaled steroids: Secondary | ICD-10-CM | POA: Insufficient documentation

## 2015-12-30 DIAGNOSIS — G44209 Tension-type headache, unspecified, not intractable: Secondary | ICD-10-CM | POA: Diagnosis not present

## 2015-12-30 DIAGNOSIS — I10 Essential (primary) hypertension: Secondary | ICD-10-CM | POA: Insufficient documentation

## 2015-12-30 DIAGNOSIS — F319 Bipolar disorder, unspecified: Secondary | ICD-10-CM | POA: Insufficient documentation

## 2015-12-30 DIAGNOSIS — Z79899 Other long term (current) drug therapy: Secondary | ICD-10-CM | POA: Diagnosis not present

## 2015-12-30 DIAGNOSIS — G43909 Migraine, unspecified, not intractable, without status migrainosus: Secondary | ICD-10-CM | POA: Diagnosis present

## 2015-12-30 DIAGNOSIS — Z792 Long term (current) use of antibiotics: Secondary | ICD-10-CM | POA: Insufficient documentation

## 2015-12-30 DIAGNOSIS — I25110 Atherosclerotic heart disease of native coronary artery with unstable angina pectoris: Secondary | ICD-10-CM | POA: Insufficient documentation

## 2015-12-30 DIAGNOSIS — I252 Old myocardial infarction: Secondary | ICD-10-CM | POA: Insufficient documentation

## 2015-12-30 DIAGNOSIS — Z87891 Personal history of nicotine dependence: Secondary | ICD-10-CM | POA: Insufficient documentation

## 2015-12-30 LAB — URINALYSIS COMPLETE WITH MICROSCOPIC (ARMC ONLY)
Bilirubin Urine: NEGATIVE
GLUCOSE, UA: NEGATIVE mg/dL
Ketones, ur: NEGATIVE mg/dL
LEUKOCYTES UA: NEGATIVE
Nitrite: NEGATIVE
Protein, ur: NEGATIVE mg/dL
Specific Gravity, Urine: 1.012 (ref 1.005–1.030)
pH: 5 (ref 5.0–8.0)

## 2015-12-30 LAB — BASIC METABOLIC PANEL
Anion gap: 6 (ref 5–15)
BUN: 19 mg/dL (ref 6–20)
CHLORIDE: 110 mmol/L (ref 101–111)
CO2: 22 mmol/L (ref 22–32)
Calcium: 9.7 mg/dL (ref 8.9–10.3)
Creatinine, Ser: 0.9 mg/dL (ref 0.44–1.00)
GFR calc Af Amer: >60 mL/min (ref >60)
GFR calc non Af Amer: >60 mL/min (ref >60)
GLUCOSE: 171 mg/dL — AB (ref 65–99)
POTASSIUM: 3.4 mmol/L — AB (ref 3.5–5.1)
Sodium: 138 mmol/L (ref 135–145)

## 2015-12-30 LAB — CBC
HEMATOCRIT: 37 % (ref 35.0–47.0)
Hemoglobin: 12.7 g/dL (ref 12.0–16.0)
MCH: 31.1 pg (ref 26.0–34.0)
MCHC: 34.2 g/dL (ref 32.0–36.0)
MCV: 90.9 fL (ref 80.0–100.0)
Platelets: 194 10*3/uL (ref 150–440)
RBC: 4.08 MIL/uL (ref 3.80–5.20)
RDW: 13.5 % (ref 11.5–14.5)
WBC: 6.9 10*3/uL (ref 3.6–11.0)

## 2015-12-30 MED ORDER — TRAMADOL HCL 50 MG PO TABS
50.0000 mg | ORAL_TABLET | Freq: Once | ORAL | Status: AC
  Administered 2015-12-30: 50 mg via ORAL
  Filled 2015-12-30: qty 1

## 2015-12-30 MED ORDER — SODIUM CHLORIDE 0.9 % IV BOLUS (SEPSIS)
1000.0000 mL | Freq: Once | INTRAVENOUS | Status: AC
  Administered 2015-12-30: 1000 mL via INTRAVENOUS

## 2015-12-30 MED ORDER — DIPHENHYDRAMINE HCL 50 MG/ML IJ SOLN
25.0000 mg | Freq: Once | INTRAMUSCULAR | Status: AC
  Administered 2015-12-30: 25 mg via INTRAVENOUS
  Filled 2015-12-30: qty 1

## 2015-12-30 MED ORDER — KETOROLAC TROMETHAMINE 30 MG/ML IJ SOLN
30.0000 mg | Freq: Once | INTRAMUSCULAR | Status: AC
  Administered 2015-12-30: 30 mg via INTRAVENOUS
  Filled 2015-12-30: qty 1

## 2015-12-30 NOTE — ED Notes (Signed)
Pt c/o HA with neck stiffness, mild dizziness and nausea since this morning.. Pt ambulatory to front desk without difficulty.. Denies fever.. States she was recently dx with ear infection.Marland Kitchen

## 2015-12-30 NOTE — ED Provider Notes (Signed)
[facility] Emergency Department Provider Note   ____________________________________________  Time seen: I have reviewed the triage vital signs and the triage nursing note.  HISTORY  Chief Complaint Headache and Neck Pain   Historian Patient  HPI Carol [name] is a 60 y.o. female with a history of migraines, as well as a past medical history of stroke, MI, bipolar affective disorder, here for evaluation of headache. She states the headache started this morning was gradual onset slightly worsening over the course the day. She woke up this morning with some stiffness to the shoulders at the base of the neck didn't seem to work its way up to the base of the skull and worked into a headache. Different from her normal migraines. There is no aura. At home she typically takes tramadol for headache. She denies headache, cough, trouble breathing, chest pain, palpitations, dizziness, weakness or numbness, confusion or altered mental status.  There's been no history of fevers. No nausea or vomiting.  Patient states that she was recently on doxycycline it sounds like possibly for an ear infection. She also reports that at the time she was placed on doxycycline for 10 days she had a nonspecific skin rash over her lower extremities that her doctor asked her to monitor for target lesion. She never noticed a target lesion. They had discussed that doxycycline would cover for tickborne illness.    Past Medical History  Diagnosis Date  . Coronary artery disease   . Hypertension   . Bipolar affective disorder ([place])   . Hyperlipidemia   . Hypothyroid   . Myocardial infarction ([place])   . Stroke [facility])     Patient Active Problem List   Diagnosis Date Noted  . Chest pain 05/12/2015  . Right facial numbness 03/30/2015  . Lithium intoxication 02/27/2015  . Bipolar disorder ([place]) 02/27/2015  . CVA (cerebral infarction) 02/26/2015  . Unstable angina ([place]) 02/03/2015    Past  Surgical History  Procedure Laterality Date  . Appendectomy    . Hysterotomy    . Cardiac catheterization Left 09/26/2015    Procedure: Left Heart Cath and Coronary Angiography;  Surgeon: Teodoro Spray, MD;  Location: [place];  Service: Cardiovascular;  Laterality: Left;  . Abdominal hysterectomy      Current Outpatient Rx  Name  Route  Sig  Dispense  Refill  . albuterol (PROVENTIL HFA;VENTOLIN HFA) 108 (90 BASE) MCG/ACT inhaler   Inhalation   Inhale 2 puffs into the lungs every 6 (six) hours as needed for wheezing or shortness of breath.         . budesonide-formoterol (SYMBICORT) 160-4.5 MCG/ACT inhaler   Inhalation   Inhale 2 puffs into the lungs 2 (two) times daily.          . cetirizine (ZYRTEC) 10 MG tablet   Oral   Take 10 mg by mouth daily.         . citalopram (CELEXA) 40 MG tablet   Oral   Take 0.5 tablets (20 mg total) by mouth daily.   1 tablet   0     Pt may take half of her 40mg  , this is due to conc ...   . clopidogrel (PLAVIX) 75 MG tablet   Oral   Take 75 mg by mouth every evening.          Marland Kitchen doxycycline (VIBRAMYCIN) 100 MG capsule   Oral   Take 1 capsule (100 mg total) by mouth 2 (two) times daily.  20 capsule   0   . ferrous sulfate 325 (65 FE) MG tablet   Oral   Take 325 mg by mouth every 7 (seven) days. Pt takes on Saturday.         . isosorbide mononitrate (IMDUR) 30 MG 24 hr tablet   Oral   Take 30 mg by mouth daily.         Marland Kitchen levothyroxine (SYNTHROID, LEVOTHROID) 100 MCG tablet   Oral   Take 100 mcg by mouth daily.         Marland Kitchen lisinopril (PRINIVIL,ZESTRIL) 5 MG tablet   Oral   Take 5 mg by mouth daily.         Marland Kitchen lubiprostone (AMITIZA) 8 MCG capsule   Oral   Take 8 mcg by mouth 2 (two) times daily as needed for constipation.         . metoprolol tartrate (LOPRESSOR) 25 MG tablet   Oral   Take 25 mg by mouth 2 (two) times daily.         . simvastatin (ZOCOR) 20 MG tablet   Oral   Take 20 mg by  mouth at bedtime.         . traMADol (ULTRAM) 50 MG tablet   Oral   Take 1 tablet (50 mg total) by mouth every 6 (six) hours as needed.   10 tablet   0   . traMADol (ULTRAM) 50 MG tablet   Oral   Take 1 tablet (50 mg total) by mouth every 6 (six) hours as needed for moderate pain. Patient not taking: Reported on 08/21/2015   12 tablet   0     Allergies Morphine; Pregabalin; Rofecoxib; Sulfamethoxazole-trimethoprim; and Asa  Family History  Problem Relation Age of Onset  . CAD      Social History Social History  Substance Use Topics  . Smoking status: Former Smoker -- 1.00 packs/day for 40 years  . Smokeless tobacco: Never Used  . Alcohol Use: No    Review of Systems  Constitutional: Negative for fever. Eyes: Negative for visual changes. ENT: Negative for sore throat. Cardiovascular: Negative for chest pain. Respiratory: Negative for shortness of breath. Gastrointestinal: Negative for abdominal pain, vomiting and diarrhea. Genitourinary: Negative for dysuria. Musculoskeletal: Negative for back pain. Skin: Negative for rash. Neurological: Positive for headache. 10 point Review of Systems otherwise negative ____________________________________________   PHYSICAL EXAM:  VITAL SIGNS: ED Triage Vitals  Enc Vitals Group     BP 12/30/15 1550 118/58 mmHg     Pulse Rate 12/30/15 1549 54     Resp 12/30/15 1549 16     Temp 12/30/15 1549 98.2 F (36.8 C)     Temp Source 12/30/15 1549 Oral     SpO2 12/30/15 1549 96 %     Weight 12/30/15 1549 139 lb (63.05 kg)     Height 12/30/15 1549 5' (1.524 m)     Head Cir --      Peak Flow --      Pain Score 12/30/15 1549 9     Pain Loc --      Pain Edu? --      Excl. in [place]? --      Constitutional: Alert and oriented. Well appearing and in no distress. HEENT   Head: Normocephalic and atraumatic.      Eyes: Conjunctivae are normal. PERRL. Normal extraocular movements.      Ears:   Tubes in ears. Scarring of the  tympanic membrane without fluid  or erythema bilaterally.      Nose: No congestion/rhinnorhea.   Mouth/Throat: Mucous membranes are moist.   Neck: No stridor.Musculature of the neck bilaterally sort of operation. No midline posterior C-spine tenderness to palpation. Cardiovascular/Chest: Bradycardic, regular rhythm.  No murmurs, rubs, or gallops. Respiratory: Normal respiratory effort without tachypnea nor retractions. Breath sounds are clear and equal bilaterally. No wheezes/rales/rhonchi. Gastrointestinal: Soft. No distention, no guarding, no rebound. Nontender.  Genitourinary/rectal:Deferred Musculoskeletal: Nontender with normal range of motion in all extremities. No joint effusions.  No lower extremity tenderness.  No edema. Neurologic:  Normal speech and language. No gross or focal neurologic deficits are appreciated. Skin:  Skin is warm, dry and intact. No rash noted. Psychiatric: Mood and affect are normal. Speech and behavior are normal. Patient exhibits appropriate insight and judgment.  ____________________________________________   EKG I, Lisa Roca, MD, the attending physician have personally viewed and interpreted all ECGs.  None ____________________________________________  LABS (pertinent positives/negatives)  None  ____________________________________________  RADIOLOGY All Xrays were viewed by me. Imaging interpreted by Radiologist.  None __________________________________________  PROCEDURES  Procedure(s) performed: None  Critical Care performed: None  ____________________________________________   ED COURSE / ASSESSMENT AND PLAN  Pertinent labs & imaging results that were available during my care of the patient were reviewed by me and considered in my medical decision making (see chart for details).   This patient is overall well-appearing with stable vital signs although some bradycardia and she is on metoprolol. She's had no fever by  history, nor on exam. She is requesting treatment for headache and neck stiffness. It seems to me she is having tension/spasm to the muscles of the neck which probably went into a tension-type headache. She is not complaining of any high risk/red flag features for intracranial abnormality including stroke or bleeding and I don't feel he can head CT is indicated at this point in time.  In terms of the neck stiffness complaint, I'm not concerned about meningitis, the patient is well-appearing with no history of fever, and a normal white blood cell count.  I discussed this with her that if she has persistent headache or neck stiffness we might consider lumbar puncture.  Patient was given Toradol and Benadryl and IV fluids and reports her headache is much better. She still rates it at a 5 out of 10, but states that she is ready to go home. I'm going to give her what she would take at home which is a tramadol.  Again I don't have a high suspicion of an emergency medical condition. I'm not suspicious of a vascular emergency.   Patient is requesting to go home. I will go ahead and discharge her home she understands return precautions.  CONSULTATIONS:   None   Patient / Family / Caregiver informed of clinical course, medical decision-making process, and agree with plan.     ___________________________________________   FINAL CLINICAL IMPRESSION(S) / ED DIAGNOSES   Final diagnoses:  Tension-type headache, not intractable, unspecified chronicity pattern              Note: This dictation was prepared with Dragon dictation. Any transcriptional errors that result from this process are unintentional   Lisa Roca, MD 12/30/15 2038

## 2015-12-30 NOTE — Discharge Instructions (Signed)
You were evaluated for headache and neck stiffness, and I am suspicious of tension type headache. Return to emergency department for any worsening condition including confusion altered mental status, weakness, numbness, dizziness or passing out, worsening headache, or any other symptoms concerning to you.  Tension Headache A tension headache is pain, pressure, or aching that is felt over the front and sides of your head. These headaches can last from 30 minutes to several days. HOME CARE Managing Pain  Take over-the-counter and prescription medicines only as told by your doctor.  Lie down in a dark, quiet room when you have a headache.  If directed, apply ice to your head and neck area:  Put ice in a plastic bag.  Place a towel between your skin and the bag.  Leave the ice on for 20 minutes, 2-3 times per day.  Use a heating pad or a hot shower to apply heat to your head and neck area as told by your doctor. Eating and Drinking  Eat meals on a regular schedule.  Do not drink a lot of alcohol.  Do not use a lot of caffeine, or stop using caffeine. General Instructions  Keep all follow-up visits as told by your doctor. This is important.  Keep a journal to find out if certain things bring on headaches. For example, write down:  What you eat and drink.  How much sleep you get.  Any change to your diet or medicines.  Try getting a massage, or doing other things that help you to relax.  Lessen stress.  Sit up straight. Do not tighten (tense) your muscles.  Do not use tobacco products. This includes cigarettes, chewing tobacco, or e-cigarettes. If you need help quitting, ask your doctor.  Exercise regularly as told by your doctor.  Get enough sleep. This may mean 7-9 hours of sleep. GET HELP IF:  Your symptoms are not helped by medicine.  You have a headache that feels different from your usual headache.  You feel sick to your stomach (nauseous) or you throw up  (vomit).  You have a fever. GET HELP RIGHT AWAY IF:  Your headache becomes very bad.  You keep throwing up.  You have a stiff neck.  You have trouble seeing.  You have trouble speaking.  You have pain in your eye or ear.  Your muscles are weak or you lose muscle control.  You lose your balance or you have trouble walking.  You feel like you will pass out (faint) or you pass out.  You have confusion.   This information is not intended to replace advice given to you by your health care provider. Make sure you discuss any questions you have with your health care provider.   Document Released: 10/27/2009 Document Revised: 04/23/2015 Document Reviewed: 11/25/2014 Elsevier Interactive Patient Education [employer].

## 2015-12-30 NOTE — ED Notes (Signed)
Patient c/o neck stiffness and pain that started around 0700. Patient states this pain is different from her usual migraines.

## 2015-12-30 NOTE — ED Notes (Signed)
Patient given sprite and saltine crackers.

## 2016-01-04 ENCOUNTER — Emergency Department: Payer: Commercial Managed Care - HMO

## 2016-01-04 ENCOUNTER — Emergency Department
Admission: EM | Admit: 2016-01-04 | Discharge: 2016-01-04 | Disposition: A | Discharge status: Home / Self Care | Payer: Commercial Managed Care - HMO | Attending: Emergency Medicine | Admitting: Emergency Medicine

## 2016-01-04 DIAGNOSIS — Z79899 Other long term (current) drug therapy: Secondary | ICD-10-CM | POA: Diagnosis not present

## 2016-01-04 DIAGNOSIS — E039 Hypothyroidism, unspecified: Secondary | ICD-10-CM | POA: Diagnosis not present

## 2016-01-04 DIAGNOSIS — R109 Unspecified abdominal pain: Secondary | ICD-10-CM | POA: Diagnosis present

## 2016-01-04 DIAGNOSIS — E785 Hyperlipidemia, unspecified: Secondary | ICD-10-CM | POA: Diagnosis not present

## 2016-01-04 DIAGNOSIS — Z7951 Long term (current) use of inhaled steroids: Secondary | ICD-10-CM | POA: Diagnosis not present

## 2016-01-04 DIAGNOSIS — R1110 Vomiting, unspecified: Secondary | ICD-10-CM | POA: Insufficient documentation

## 2016-01-04 DIAGNOSIS — I252 Old myocardial infarction: Secondary | ICD-10-CM | POA: Diagnosis not present

## 2016-01-04 DIAGNOSIS — F319 Bipolar disorder, unspecified: Secondary | ICD-10-CM | POA: Insufficient documentation

## 2016-01-04 DIAGNOSIS — I10 Essential (primary) hypertension: Secondary | ICD-10-CM | POA: Insufficient documentation

## 2016-01-04 DIAGNOSIS — I25110 Atherosclerotic heart disease of native coronary artery with unstable angina pectoris: Secondary | ICD-10-CM | POA: Insufficient documentation

## 2016-01-04 DIAGNOSIS — Z792 Long term (current) use of antibiotics: Secondary | ICD-10-CM | POA: Insufficient documentation

## 2016-01-04 DIAGNOSIS — Z87891 Personal history of nicotine dependence: Secondary | ICD-10-CM | POA: Diagnosis not present

## 2016-01-04 DIAGNOSIS — Z8673 Personal history of transient ischemic attack (TIA), and cerebral infarction without residual deficits: Secondary | ICD-10-CM | POA: Insufficient documentation

## 2016-01-04 DIAGNOSIS — R112 Nausea with vomiting, unspecified: Secondary | ICD-10-CM

## 2016-01-04 LAB — COMPREHENSIVE METABOLIC PANEL
ALT: 13 U/L — ABNORMAL LOW (ref 14–54)
ANION GAP: 5 (ref 5–15)
AST: 17 U/L (ref 15–41)
Albumin: 4.2 g/dL (ref 3.5–5.0)
Alkaline Phosphatase: 92 U/L (ref 38–126)
BUN: 14 mg/dL (ref 6–20)
CHLORIDE: 112 mmol/L — AB (ref 101–111)
CO2: 25 mmol/L (ref 22–32)
CREATININE: 0.8 mg/dL (ref 0.44–1.00)
Calcium: 9.4 mg/dL (ref 8.9–10.3)
Glucose, Bld: 123 mg/dL — ABNORMAL HIGH (ref 65–99)
Potassium: 3.9 mmol/L (ref 3.5–5.1)
SODIUM: 142 mmol/L (ref 135–145)
Total Bilirubin: 0.3 mg/dL (ref 0.3–1.2)
Total Protein: 7.1 g/dL (ref 6.5–8.1)

## 2016-01-04 LAB — CBC
HCT: 37.4 % (ref 35.0–47.0)
HEMOGLOBIN: 12.7 g/dL (ref 12.0–16.0)
MCH: 31.4 pg (ref 26.0–34.0)
MCHC: 33.9 g/dL (ref 32.0–36.0)
MCV: 92.6 fL (ref 80.0–100.0)
PLATELETS: 178 10*3/uL (ref 150–440)
RBC: 4.04 MIL/uL (ref 3.80–5.20)
RDW: 13.6 % (ref 11.5–14.5)
WBC: 6.6 10*3/uL (ref 3.6–11.0)

## 2016-01-04 LAB — URINALYSIS COMPLETE WITH MICROSCOPIC (ARMC ONLY)
BILIRUBIN URINE: NEGATIVE
Glucose, UA: NEGATIVE mg/dL
HGB URINE DIPSTICK: NEGATIVE
KETONES UR: NEGATIVE mg/dL
LEUKOCYTES UA: NEGATIVE
Nitrite: NEGATIVE
PH: 5 (ref 5.0–8.0)
PROTEIN: NEGATIVE mg/dL
Specific Gravity, Urine: 1.018 (ref 1.005–1.030)

## 2016-01-04 LAB — LIPASE, BLOOD: LIPASE: 27 U/L (ref 11–51)

## 2016-01-04 MED ORDER — ONDANSETRON HCL 4 MG/2ML IJ SOLN
4.0000 mg | Freq: Once | INTRAMUSCULAR | Status: AC
  Administered 2016-01-04: 4 mg via INTRAVENOUS
  Filled 2016-01-04: qty 2

## 2016-01-04 MED ORDER — FENTANYL CITRATE (PF) 100 MCG/2ML IJ SOLN
25.0000 ug | Freq: Once | INTRAMUSCULAR | Status: AC
  Administered 2016-01-04: 25 ug via INTRAVENOUS
  Filled 2016-01-04: qty 2

## 2016-01-04 MED ORDER — ONDANSETRON HCL 4 MG PO TABS: 4.0000 mg | ORAL_TABLET | Freq: Every day | ORAL | Status: DC | PRN

## 2016-01-04 MED ORDER — DICYCLOMINE HCL 20 MG PO TABS: 20.0000 mg | ORAL_TABLET | Freq: Three times a day (TID) | ORAL | Status: DC | PRN

## 2016-01-04 MED ORDER — IOPAMIDOL (ISOVUE-300) INJECTION 61%
100.0000 mL | Freq: Once | INTRAVENOUS | Status: AC | PRN
  Administered 2016-01-04: 100 mL via INTRAVENOUS

## 2016-01-04 MED ORDER — DIATRIZOATE MEGLUMINE & SODIUM 66-10 % PO SOLN
15.0000 mL | Freq: Once | ORAL | Status: AC
  Administered 2016-01-04: 15 mL via ORAL

## 2016-01-04 NOTE — ED Provider Notes (Signed)
[facility] Emergency Department Provider Note        Time seen: ----------------------------------------- 6:50 PM on 01/04/2016 -----------------------------------------    I have reviewed the triage vital signs and the nursing notes.   HISTORY  Chief Complaint Abdominal Pain    HPI Carol [name] is a 60 y.o. female who presents ER for abdominal pain for last 2 days. Patient states pain is worse with eating and she's been vomiting. She denies any diarrhea. Nothing makes her pain better. Patient states this does not happen to her before. He does not think this is viral.   Past Medical History  Diagnosis Date  . Coronary artery disease   . Hypertension   . Bipolar affective disorder ([place])   . Hyperlipidemia   . Hypothyroid   . Myocardial infarction ([place])   . Stroke [facility])     Patient Active Problem List   Diagnosis Date Noted  . Chest pain 05/12/2015  . Right facial numbness 03/30/2015  . Lithium intoxication 02/27/2015  . Bipolar disorder ([place]) 02/27/2015  . CVA (cerebral infarction) 02/26/2015  . Unstable angina ([place]) 02/03/2015    Past Surgical History  Procedure Laterality Date  . Appendectomy    . Hysterotomy    . Cardiac catheterization Left 09/26/2015    Procedure: Left Heart Cath and Coronary Angiography;  Surgeon: Teodoro Spray, MD;  Location: [facility];  Service: Cardiovascular;  Laterality: Left;  . Abdominal hysterectomy      Allergies Morphine; Pregabalin; Rofecoxib; Sulfamethoxazole-trimethoprim; and Asa  Social History Social History  Substance Use Topics  . Smoking status: Former Smoker -- 1.00 packs/day for 40 years  . Smokeless tobacco: Never Used  . Alcohol Use: No    Review of Systems Constitutional: Negative for fever. Eyes: Negative for visual changes. ENT: Negative for sore throat. Cardiovascular: Negative for chest pain. Respiratory: Negative for shortness of breath. Gastrointestinal:  Positive for abdominal pain, vomiting Genitourinary: Negative for dysuria. Musculoskeletal: Negative for back pain. Skin: Negative for rash. Neurological: Negative for headaches, focal weakness or numbness.  10-point ROS otherwise negative.  ____________________________________________   PHYSICAL EXAM:  VITAL SIGNS: ED Triage Vitals  Enc Vitals Group     BP 01/04/16 1733 128/62 mmHg     Pulse Rate 01/04/16 1733 60     Resp 01/04/16 1733 20     Temp 01/04/16 1733 97.7 F (36.5 C)     Temp Source 01/04/16 1733 Oral     SpO2 01/04/16 1733 96 %     Weight 01/04/16 1733 136 lb (61.689 kg)     Height 01/04/16 1733 5' (1.524 m)     Head Cir --      Peak Flow --      Pain Score 01/04/16 1735 7     Pain Loc --      Pain Edu? --      Excl. in [place]? --     Constitutional: Alert and oriented. Well appearing and in no distress. Eyes: Conjunctivae are normal. PERRL. Normal extraocular movements. ENT   Head: Normocephalic and atraumatic.   Nose: No congestion/rhinnorhea.   Mouth/Throat: Mucous membranes are moist.   Neck: No stridor. Cardiovascular: Normal rate, regular rhythm. No murmurs, rubs, or gallops. Respiratory: Normal respiratory effort without tachypnea nor retractions. Breath sounds are clear and equal bilaterally. No wheezes/rales/rhonchi. Gastrointestinal: Soft, moderate lower abdominal tenderness, hyperactive bowel sounds. Musculoskeletal: Nontender with normal range of motion in all extremities. No lower extremity tenderness nor edema. Neurologic:  Normal speech and language. No gross focal neurologic deficits are appreciated.  Skin:  Skin is warm, dry and intact. No rash noted. Psychiatric: Mood and affect are normal. Speech and behavior are normal.  ____________________________________________  EKG: Interpreted by me. Sinus bradycardia with rate of 55 bpm, normal PR interval, normal QRS width, normal QT interval. Normal axis. Nonspecific ST segment  changes  ____________________________________________  ED COURSE:  Pertinent labs & imaging results that were available during my care of the patient were reviewed by me and considered in my medical decision making (see chart for details). Patient presents with abdominal pain of uncertain etiology. I will check basic labs and imaging. She will receive IV fentanyl for pain. ____________________________________________    LABS (pertinent positives/negatives)  Labs Reviewed  COMPREHENSIVE METABOLIC PANEL - Abnormal; Notable for the following:    Chloride 112 (*)    Glucose, Bld 123 (*)    ALT 13 (*)    All other components within normal limits  URINALYSIS COMPLETEWITH MICROSCOPIC ([facility] ONLY) - Abnormal; Notable for the following:    Color, Urine YELLOW (*)    APPearance CLEAR (*)    Bacteria, UA RARE (*)    Squamous Epithelial / LPF 0-5 (*)    All other components within normal limits  LIPASE, BLOOD  CBC    RADIOLOGY Images were viewed by me  CT of the abdomen and pelvis with contrast IMPRESSION: 1. No acute findings in the abdomen or pelvis. No free fluid or inflammatory change. No bowel obstruction or evidence of bowel wall inflammation. No evidence of acute solid organ abnormality. No renal or ureteral calculi. 2. Chronic/incidental findings detailed above. ____________________________________________  FINAL ASSESSMENT AND PLAN  Abdominal pain, vomiting  Plan: Patient with labs and imaging as dictated above. Patient is in no acute distress, no clear etiology for her symptoms. Likely viral etiology. She'll be discharged with antiemetics and mild pain medicine.   Earleen Newport, MD   Note: This dictation was prepared with Dragon dictation. Any transcriptional errors that result from this process are unintentional   Earleen Newport, MD 01/04/16 2032

## 2016-01-04 NOTE — ED Notes (Signed)
Epi gastric pain for last two days tender touch. Pain worse after eating. Pt also nausea and vomiting denies diarrhea.

## 2016-01-04 NOTE — Discharge Instructions (Signed)
Abdominal Pain, Adult °Many things can cause abdominal pain. Usually, abdominal pain is not caused by a disease and will improve without treatment. It can often be observed and treated at home. Your health care provider will do a physical exam and possibly order blood tests and X-rays to help determine the seriousness of your pain. However, in many cases, more time must pass before a clear cause of the pain can be found. Before that point, your health care provider may not know if you need more testing or further treatment. °HOME CARE INSTRUCTIONS °Monitor your abdominal pain for any changes. The following actions may help to alleviate any discomfort you are experiencing: °· Only take over-the-counter or prescription medicines as directed by your health care provider. °· Do not take laxatives unless directed to do so by your health care provider. °· Try a clear liquid diet (broth, tea, or water) as directed by your health care provider. Slowly move to a bland diet as tolerated. °SEEK MEDICAL CARE IF: °· You have unexplained abdominal pain. °· You have abdominal pain associated with nausea or diarrhea. °· You have pain when you urinate or have a bowel movement. °· You experience abdominal pain that wakes you in the night. °· You have abdominal pain that is worsened or improved by eating food. °· You have abdominal pain that is worsened with eating fatty foods. °· You have a fever. °SEEK IMMEDIATE MEDICAL CARE IF: °· Your pain does not go away within 2 hours. °· You keep throwing up (vomiting). °· Your pain is felt only in portions of the abdomen, such as the right side or the left lower portion of the abdomen. °· You pass bloody or black tarry stools. °MAKE SURE YOU: °· Understand these instructions. °· Will watch your condition. °· Will get help right away if you are not doing well or get worse. °  °This information is not intended to replace advice given to you by your health care provider. Make sure you discuss  any questions you have with your health care provider. °  °Document Released: 05/12/2005 Document Revised: 04/23/2015 Document Reviewed: 04/11/2013 °Elsevier Interactive Patient Education ©2016 [employer] ° °Nausea and Vomiting °Nausea is a sick feeling that often comes before throwing up (vomiting). Vomiting is a reflex where stomach contents come out of your mouth. Vomiting can cause severe loss of body fluids (dehydration). Children and elderly adults can become dehydrated quickly, especially if they also have diarrhea. Nausea and vomiting are symptoms of a condition or disease. It is important to find the cause of your symptoms. °CAUSES  °· Direct irritation of the stomach lining. This irritation can result from increased acid production (gastroesophageal reflux disease), infection, food poisoning, taking certain medicines (such as nonsteroidal anti-inflammatory drugs), alcohol use, or tobacco use. °· Signals from the brain. These signals could be caused by a headache, heat exposure, an inner ear disturbance, increased pressure in the brain from injury, infection, a tumor, or a concussion, pain, emotional stimulus, or metabolic problems. °· An obstruction in the gastrointestinal tract (bowel obstruction). °· Illnesses such as diabetes, hepatitis, gallbladder problems, appendicitis, kidney problems, cancer, sepsis, atypical symptoms of a heart attack, or eating disorders. °· Medical treatments such as chemotherapy and radiation. °· Receiving medicine that makes you sleep (general anesthetic) during surgery. °DIAGNOSIS °Your caregiver may ask for tests to be done if the problems do not improve after a few days. Tests may also be done if symptoms are severe or if the reason for the   nausea and vomiting is not clear. Tests may include: °· Urine tests. °· Blood tests. °· Stool tests. °· Cultures (to look for evidence of infection). °· X-rays or other imaging studies. °Test results can help your caregiver make  decisions about treatment or the need for additional tests. °TREATMENT °You need to stay well hydrated. Drink frequently but in small amounts. You may wish to drink water, sports drinks, clear broth, or eat frozen ice pops or gelatin dessert to help stay hydrated. When you eat, eating slowly may help prevent nausea. There are also some antinausea medicines that may help prevent nausea. °HOME CARE INSTRUCTIONS  °· Take all medicine as directed by your caregiver. °· If you do not have an appetite, do not force yourself to eat. However, you must continue to drink fluids. °· If you have an appetite, eat a normal diet unless your caregiver tells you differently. °¨ Eat a variety of complex carbohydrates (rice, wheat, potatoes, bread), lean meats, yogurt, fruits, and vegetables. °¨ Avoid high-fat foods because they are more difficult to digest. °· Drink enough water and fluids to keep your urine clear or pale yellow. °· If you are dehydrated, ask your caregiver for specific rehydration instructions. Signs of dehydration may include: °¨ Severe thirst. °¨ Dry lips and mouth. °¨ Dizziness. °¨ Dark urine. °¨ Decreasing urine frequency and amount. °¨ Confusion. °¨ Rapid breathing or pulse. °SEEK IMMEDIATE MEDICAL CARE IF:  °· You have blood or brown flecks (like coffee grounds) in your vomit. °· You have black or bloody stools. °· You have a severe headache or stiff neck. °· You are confused. °· You have severe abdominal pain. °· You have chest pain or trouble breathing. °· You do not urinate at least once every 8 hours. °· You develop cold or clammy skin. °· You continue to vomit for longer than 24 to 48 hours. °· You have a fever. °MAKE SURE YOU:  °· Understand these instructions. °· Will watch your condition. °· Will get help right away if you are not doing well or get worse. °  °This information is not intended to replace advice given to you by your health care provider. Make sure you discuss any questions you have with  your health care provider. °  °Document Released: 08/02/2005 Document Revised: 10/25/2011 Document Reviewed: 12/30/2010 °Elsevier Interactive Patient Education ©2016 [employer] ° °

## 2016-06-17 ENCOUNTER — Encounter: Payer: Self-pay | Admitting: Emergency Medicine

## 2016-06-17 ENCOUNTER — Observation Stay
Admission: EM | Admit: 2016-06-17 | Discharge: 2016-06-17 | Disposition: A | Discharge status: Home / Self Care | Payer: Commercial Managed Care - HMO | Attending: Internal Medicine | Admitting: Internal Medicine

## 2016-06-17 ENCOUNTER — Emergency Department: Payer: Commercial Managed Care - HMO

## 2016-06-17 DIAGNOSIS — E039 Hypothyroidism, unspecified: Secondary | ICD-10-CM | POA: Diagnosis not present

## 2016-06-17 DIAGNOSIS — F319 Bipolar disorder, unspecified: Secondary | ICD-10-CM | POA: Diagnosis not present

## 2016-06-17 DIAGNOSIS — I10 Essential (primary) hypertension: Secondary | ICD-10-CM | POA: Insufficient documentation

## 2016-06-17 DIAGNOSIS — G43909 Migraine, unspecified, not intractable, without status migrainosus: Secondary | ICD-10-CM | POA: Diagnosis present

## 2016-06-17 DIAGNOSIS — R531 Weakness: Secondary | ICD-10-CM

## 2016-06-17 DIAGNOSIS — Z79899 Other long term (current) drug therapy: Secondary | ICD-10-CM | POA: Diagnosis not present

## 2016-06-17 DIAGNOSIS — Z8673 Personal history of transient ischemic attack (TIA), and cerebral infarction without residual deficits: Secondary | ICD-10-CM | POA: Diagnosis not present

## 2016-06-17 DIAGNOSIS — R51 Headache: Secondary | ICD-10-CM | POA: Diagnosis not present

## 2016-06-17 DIAGNOSIS — I2510 Atherosclerotic heart disease of native coronary artery without angina pectoris: Secondary | ICD-10-CM | POA: Diagnosis not present

## 2016-06-17 DIAGNOSIS — R200 Anesthesia of skin: Secondary | ICD-10-CM | POA: Diagnosis not present

## 2016-06-17 DIAGNOSIS — R519 Headache, unspecified: Secondary | ICD-10-CM

## 2016-06-17 DIAGNOSIS — Z8249 Family history of ischemic heart disease and other diseases of the circulatory system: Secondary | ICD-10-CM | POA: Insufficient documentation

## 2016-06-17 DIAGNOSIS — I252 Old myocardial infarction: Secondary | ICD-10-CM | POA: Diagnosis not present

## 2016-06-17 DIAGNOSIS — Z87891 Personal history of nicotine dependence: Secondary | ICD-10-CM | POA: Diagnosis not present

## 2016-06-17 DIAGNOSIS — Z7951 Long term (current) use of inhaled steroids: Secondary | ICD-10-CM | POA: Insufficient documentation

## 2016-06-17 DIAGNOSIS — Z7902 Long term (current) use of antithrombotics/antiplatelets: Secondary | ICD-10-CM | POA: Insufficient documentation

## 2016-06-17 DIAGNOSIS — Z886 Allergy status to analgesic agent status: Secondary | ICD-10-CM | POA: Insufficient documentation

## 2016-06-17 DIAGNOSIS — E785 Hyperlipidemia, unspecified: Secondary | ICD-10-CM | POA: Diagnosis not present

## 2016-06-17 LAB — COMPREHENSIVE METABOLIC PANEL
ALT: 11 U/L — ABNORMAL LOW (ref 14–54)
ANION GAP: 6 (ref 5–15)
AST: 18 U/L (ref 15–41)
Albumin: 4.3 g/dL (ref 3.5–5.0)
Alkaline Phosphatase: 99 U/L (ref 38–126)
BUN: 14 mg/dL (ref 6–20)
CHLORIDE: 110 mmol/L (ref 101–111)
CO2: 22 mmol/L (ref 22–32)
Calcium: 9.6 mg/dL (ref 8.9–10.3)
Creatinine, Ser: 0.79 mg/dL (ref 0.44–1.00)
GFR calc non Af Amer: >60 mL/min (ref >60)
Glucose, Bld: 108 mg/dL — ABNORMAL HIGH (ref 65–99)
Potassium: 4.1 mmol/L (ref 3.5–5.1)
SODIUM: 138 mmol/L (ref 135–145)
Total Bilirubin: <0.1 mg/dL — ABNORMAL LOW (ref 0.3–1.2)
Total Protein: 7.5 g/dL (ref 6.5–8.1)

## 2016-06-17 LAB — DIFFERENTIAL
BASOS PCT: 1 %
Basophils Absolute: 0.1 10*3/uL (ref 0–0.1)
EOS PCT: 1 %
Eosinophils Absolute: 0.1 10*3/uL (ref 0–0.7)
Lymphocytes Relative: 30 %
Lymphs Abs: 2.3 10*3/uL (ref 1.0–3.6)
MONO ABS: 0.4 10*3/uL (ref 0.2–0.9)
Monocytes Relative: 5 %
NEUTROS ABS: 4.8 10*3/uL (ref 1.4–6.5)
Neutrophils Relative %: 63 %

## 2016-06-17 LAB — GLUCOSE, CAPILLARY: GLUCOSE-CAPILLARY: 113 mg/dL — AB (ref 65–99)

## 2016-06-17 LAB — PROTIME-INR
INR: 0.89
PROTHROMBIN TIME: 12 s (ref 11.4–15.2)

## 2016-06-17 LAB — CBC
HCT: 38.8 % (ref 35.0–47.0)
Hemoglobin: 13.3 g/dL (ref 12.0–16.0)
MCH: 31.8 pg (ref 26.0–34.0)
MCHC: 34.3 g/dL (ref 32.0–36.0)
MCV: 92.7 fL (ref 80.0–100.0)
PLATELETS: 190 10*3/uL (ref 150–440)
RBC: 4.18 MIL/uL (ref 3.80–5.20)
RDW: 13.5 % (ref 11.5–14.5)
WBC: 7.6 10*3/uL (ref 3.6–11.0)

## 2016-06-17 LAB — TROPONIN I: Troponin I: <0.03 ng/mL (ref <0.03)

## 2016-06-17 LAB — APTT: aPTT: 28 seconds (ref 24–36)

## 2016-06-17 MED ORDER — ALBUTEROL SULFATE (2.5 MG/3ML) 0.083% IN NEBU: 2.5000 mg | INHALATION_SOLUTION | RESPIRATORY_TRACT | Status: DC | PRN

## 2016-06-17 MED ORDER — ACETAMINOPHEN 325 MG PO TABS: 650.0000 mg | ORAL_TABLET | Freq: Four times a day (QID) | ORAL | Status: DC | PRN

## 2016-06-17 MED ORDER — TRAMADOL HCL 50 MG PO TABS: 50.0000 mg | ORAL_TABLET | Freq: Four times a day (QID) | ORAL | Status: DC | PRN

## 2016-06-17 MED ORDER — PROCHLORPERAZINE EDISYLATE 5 MG/ML IJ SOLN
10.0000 mg | Freq: Once | INTRAMUSCULAR | Status: AC
  Administered 2016-06-17: 10 mg via INTRAVENOUS
  Filled 2016-06-17: qty 2

## 2016-06-17 MED ORDER — ONDANSETRON HCL 4 MG PO TABS: 4.0000 mg | ORAL_TABLET | Freq: Four times a day (QID) | ORAL | Status: DC | PRN

## 2016-06-17 MED ORDER — ENOXAPARIN SODIUM 40 MG/0.4ML ~~LOC~~ SOLN: 40.0000 mg | SUBCUTANEOUS | Status: DC

## 2016-06-17 MED ORDER — SODIUM CHLORIDE 0.9 % IV SOLN: 250.0000 mL | INTRAVENOUS | Status: DC | PRN

## 2016-06-17 MED ORDER — BISACODYL 10 MG RE SUPP: 10.0000 mg | Freq: Every day | RECTAL | Status: DC | PRN

## 2016-06-17 MED ORDER — SODIUM CHLORIDE 0.9% FLUSH: 3.0000 mL | INTRAVENOUS | Status: DC | PRN

## 2016-06-17 MED ORDER — ACETAMINOPHEN 650 MG RE SUPP
650.0000 mg | Freq: Four times a day (QID) | RECTAL | Status: DC | PRN
Start: 2016-06-17 — End: 2016-06-17

## 2016-06-17 MED ORDER — SODIUM CHLORIDE 0.9% FLUSH: 3.0000 mL | Freq: Two times a day (BID) | INTRAVENOUS | Status: DC

## 2016-06-17 MED ORDER — SODIUM CHLORIDE 0.9 % IV BOLUS (SEPSIS): 1000.0000 mL | Freq: Once | INTRAVENOUS | Status: DC

## 2016-06-17 MED ORDER — ONDANSETRON HCL 4 MG/2ML IJ SOLN: 4.0000 mg | Freq: Four times a day (QID) | INTRAMUSCULAR | Status: DC | PRN

## 2016-06-17 MED ORDER — POLYETHYLENE GLYCOL 3350 17 G PO PACK: 17.0000 g | PACK | Freq: Every day | ORAL | Status: DC | PRN

## 2016-06-17 NOTE — Progress Notes (Signed)
Patient left AMA against medical advise after being admitted under observation.

## 2016-06-17 NOTE — Discharge Instructions (Signed)
Please seek medical attention for any high fevers, chest pain, shortness of breath, change in behavior, persistent vomiting, bloody stool or any other new or concerning symptoms.  

## 2016-06-17 NOTE — ED Triage Notes (Signed)
Pt with facial numbness 20 min PTA, headache and blurred vision since 1400.

## 2016-06-17 NOTE — ED Notes (Signed)
RN reiterated patient was leaving AMA. RN discussed risks of decision. Pt verbalized understanding. RN reviewed signs of stroke, need to follow-up with neurology and d/c instructions. Pt verbalized understanding

## 2016-06-17 NOTE — Progress Notes (Signed)
Pt receiving care in Ed. [place] escorted husband to room. CH is available.   06/17/16 1545  Clinical Encounter Type  Visited With Patient;Family  Visit Type Initial;ED  Referral From Nurse

## 2016-06-17 NOTE — H&P (Signed)
[place] [facility] [place] NAME: Carol [name]    MR#:  [id number]  DATE OF BIRTH:  [birth year]  DATE OF ADMISSION:  06/17/2016  PRIMARY CARE PHYSICIAN: Christie Nottingham., PA   REQUESTING/REFERRING PHYSICIAN: Dr. Archie Balboa  CHIEF COMPLAINT:   Chief Complaint  Patient presents with  . Code Stroke    HISTORY OF PRESENT ILLNESS:  Carol [name]  is a 60 y.o. female with a known history of Common migraine, hypertension, bipolar disorder presents to the emergency room with acute onset of diffuse headache and right-sided numbness and right upper extremity weakness. At similar presentation in August 2016 when she had an MRI of the brain which was negative. Was diagnosed with convex migraine. He feels are some symptoms are similar to last presentation. Patient was seen by specialist on-call of neurology and recommended admission to rule out CVA although her symptoms were thought to be due to Migraine. She has received 1 dose of IV Compazine in the emergency room. Seems comfortable.  PAST MEDICAL HISTORY:   Past Medical History:  Diagnosis Date  . Bipolar affective disorder ([place])   . Coronary artery disease   . Hyperlipidemia   . Hypertension   . Hypothyroid   . Myocardial infarction   . Stroke [facility])     PAST SURGICAL HISTORY:   Past Surgical History:  Procedure Laterality Date  . ABDOMINAL HYSTERECTOMY    . APPENDECTOMY    . CARDIAC CATHETERIZATION Left 09/26/2015   Procedure: Left Heart Cath and Coronary Angiography;  Surgeon: Teodoro Spray, MD;  Location: [place] [facility];  Service: Cardiovascular;  Laterality: Left;  . HYSTEROTOMY      SOCIAL HISTORY:   Social History  Substance Use Topics  . Smoking status: Former Smoker    Packs/day: 1.00    Years: 40.00  . Smokeless tobacco: Never Used  . Alcohol use No    FAMILY HISTORY:   Family History  Problem Relation Age of Onset  . CAD      DRUG ALLERGIES:   Allergies  Allergen  Reactions  . Morphine Other (See Comments)    Reaction:  Hallucinations   . Pregabalin Other (See Comments)    Pt states that she was told not to take this medication.    . Rofecoxib Other (See Comments)    Reaction:  Burning of stomach   . Sulfamethoxazole-Trimethoprim Swelling    Reaction:  Unknown   . Asa [Aspirin] Hives, Nausea Only and Other (See Comments)    Reaction:  Burning of stomach     REVIEW OF SYSTEMS:   Review of Systems  Constitutional: Positive for malaise/fatigue. Negative for chills, fever and weight loss.  HENT: Negative for hearing loss and nosebleeds.   Eyes: Positive for blurred vision. Negative for double vision and pain.  Respiratory: Negative for cough, hemoptysis, sputum production, shortness of breath and wheezing.   Cardiovascular: Negative for chest pain, palpitations, orthopnea and leg swelling.  Gastrointestinal: Negative for abdominal pain, constipation, diarrhea, nausea and vomiting.  Genitourinary: Negative for dysuria and hematuria.  Musculoskeletal: Negative for back pain, falls and myalgias.  Skin: Negative for rash.  Neurological: Positive for tingling, sensory change, focal weakness, weakness and headaches. Negative for dizziness, tremors, speech change and seizures.  Endo/Heme/Allergies: Does not bruise/bleed easily.  Psychiatric/Behavioral: Positive for depression. Negative for memory loss. The patient is nervous/anxious.     MEDICATIONS AT HOME:   Prior to Admission medications   Medication Sig Start Date  End Date Taking? Authorizing Provider  albuterol (PROVENTIL HFA;VENTOLIN HFA) 108 (90 BASE) MCG/ACT inhaler Inhale 2 puffs into the lungs every 6 (six) hours as needed for wheezing or shortness of breath.   Yes Historical Provider, MD  albuterol (PROVENTIL) (2.5 MG/3ML) 0.083% nebulizer solution Take 2.5 mg by nebulization every 6 (six) hours as needed for wheezing or shortness of breath.   Yes Historical Provider, MD   budesonide-formoterol (SYMBICORT) 160-4.5 MCG/ACT inhaler Inhale 2 puffs into the lungs 2 (two) times daily.    Yes Historical Provider, MD  cetirizine (ZYRTEC) 10 MG tablet Take 10 mg by mouth daily as needed.    Yes Historical Provider, MD  citalopram (CELEXA) 40 MG tablet Take 40 mg by mouth daily.   Yes Historical Provider, MD  clopidogrel (PLAVIX) 75 MG tablet Take 75 mg by mouth daily.    Yes Historical Provider, MD  isosorbide mononitrate (IMDUR) 30 MG 24 hr tablet Take 30 mg by mouth daily.   Yes Historical Provider, MD  levothyroxine (SYNTHROID, LEVOTHROID) 100 MCG tablet Take 100 mcg by mouth daily.   Yes Historical Provider, MD  lisinopril (PRINIVIL,ZESTRIL) 5 MG tablet Take 5 mg by mouth daily.   Yes Historical Provider, MD  metoprolol tartrate (LOPRESSOR) 25 MG tablet Take 25 mg by mouth 2 (two) times daily.   Yes Historical Provider, MD  simvastatin (ZOCOR) 20 MG tablet Take 20 mg by mouth at bedtime.   Yes Historical Provider, MD  Vitamin D, Ergocalciferol, (DRISDOL) 50000 units CAPS capsule Take 50,000 Units by mouth every 7 (seven) days.   Yes Historical Provider, MD  ferrous sulfate 325 (65 FE) MG tablet Take 325 mg by mouth every 7 (seven) days. Pt takes on Saturday.    Historical Provider, MD  lubiprostone (AMITIZA) 8 MCG capsule Take 8 mcg by mouth 2 (two) times daily as needed for constipation.    Historical Provider, MD     VITAL SIGNS:  Blood pressure (!) 122/53, pulse (!) 49, temperature 98.2 F (36.8 C), resp. rate 17, height 5' (1.524 m), weight 61.2 kg (135 lb), SpO2 96 %.  PHYSICAL EXAMINATION:  Physical Exam  GENERAL:  60 y.o.-year-old patient lying in the bed with no acute distress.  EYES: Pupils equal, round, reactive to light and accommodation. No scleral icterus. Extraocular muscles intact.  HEENT: Head atraumatic, normocephalic. Oropharynx and nasopharynx clear. No oropharyngeal erythema, moist oral mucosa  NECK:  Supple, no jugular venous distention. No  thyroid enlargement, no tenderness.  LUNGS: Normal breath sounds bilaterally, no wheezing, rales, rhonchi. No use of accessory muscles of respiration.  CARDIOVASCULAR: S1, S2 normal. No murmurs, rubs, or gallops.  ABDOMEN: Soft, nontender, nondistended. Bowel sounds present. No organomegaly or mass.  EXTREMITIES: No pedal edema, cyanosis, or clubbing. + 2 pedal & radial pulses b/l.   NEUROLOGIC: Cranial nerves II through XII are intact. Decreased sensation on right face arm and trunk and lower extremity. Right upper extremity motor strength 4/5. Other areas 5/5. Reflexes  normal PSYCHIATRIC: The patient is alert and oriented x 3. Good affect.  SKIN: No obvious rash, lesion, or ulcer.   LABORATORY PANEL:   CBC  Recent Labs Lab 06/17/16 1537  WBC 7.6  HGB 13.3  HCT 38.8  PLT 190   ------------------------------------------------------------------------------------------------------------------  Chemistries   Recent Labs Lab 06/17/16 1537  NA 138  K 4.1  CL 110  CO2 22  GLUCOSE 108*  BUN 14  CREATININE 0.79  CALCIUM 9.6  AST 18  ALT 11*  ALKPHOS 99  BILITOT <0.1*   ------------------------------------------------------------------------------------------------------------------  Cardiac Enzymes  Recent Labs Lab 06/17/16 1537  TROPONINI <0.03   ------------------------------------------------------------------------------------------------------------------  RADIOLOGY:  Ct Head Code Stroke W/o Cm  Result Date: 06/17/2016 CLINICAL DATA:  Code stroke. Facial numbness since 2 p.m. today. Headache. EXAM: CT HEAD WITHOUT CONTRAST TECHNIQUE: Contiguous axial images were obtained from the base of the skull through the vertex without intravenous contrast. COMPARISON:  CT head 04/09/2015 FINDINGS: Brain: Ventricle size normal. Cerebral volume normal. Small chronic infarct left caudate unchanged. Small chronic infarct left cerebellum unchanged. Negative for acute infarct,  hemorrhage, or mass lesion. Vascular: No hyperdense vessel or unexpected calcification. Skull: Negative Sinuses/Orbits: Negative Other: Incidental note of empty sella. ASPECTS [facility] Stroke Program Early CT Score) - Ganglionic level infarction (caudate, lentiform nuclei, internal capsule, insula, M1-M3 cortex): 7 - Supraganglionic infarction (M4-M6 cortex): 3 Total score (0-10 with 10 being normal): 10 IMPRESSION: 1. Negative for acute infarct. 2. ASPECTS is 10 These results were called by telephone at the time of interpretation on 06/17/2016 at 3:56 pm to Dr. Nance Pear , who verbally acknowledged these results. Electronically Signed   By: Franchot Gallo M.D.   On: 06/17/2016 15:57     IMPRESSION AND PLAN:   * Right sided numbness with right upper extremity weakness Likely complex migraine or new CVA We'll admit patient onto medical floor with telemetry. Neuro checks. Patient is allergy to aspirin. Continue Plavix. Check MRI brain. If this is positive will need workup with echocardiogram and carotid Dopplers. Physical therapy consult. Consult neurology. We'll continue patient's tramadol for her headache. We'll give bolus fluids. Compazine one dose given in the emergency room.  * Hypertension Continue medications  * Bipolar disorder Continue medications  * DVT prophylaxis with Lovenox  All the records are reviewed and case discussed with ED provider. Management plans discussed with the patient, family and they are in agreement.  CODE STATUS: FULL CODE  TOTAL TIME TAKING CARE OF THIS PATIENT: 40 minutes.   Hillary Bow R M.D on 06/17/2016 at 5:17 PM  Between 7am to 6pm - Pager - [phone]  After 6pm go to [email or web address] - password [email or web address] [facility]  Office  [phone]  CC: Primary care physician; Christie Nottingham., PA  Note: This dictation was prepared with Dragon dictation along with smaller phrase technology. Any transcriptional errors that  result from this process are unintentional.

## 2016-06-17 NOTE — ED Notes (Signed)
Pt reports headache began this morning.

## 2016-06-17 NOTE — ED Provider Notes (Signed)
[facility] Emergency Department Provider Note   ____________________________________________   I have reviewed the triage vital signs and the nursing notes.   HISTORY  Chief Complaint Code Stroke   History limited by: Not Limited   HPI Carol [name] is a 60 y.o. female with history of stroke and complex migraine who presents to the emergency department today because of concern for headache and right facial numbness. The patient states that her headache started this morning when she woke up. It was dull. It is located on the right side. About thirty minutes prior to presentation she felt like the headache got acutely worse. It is now sharp    Past Medical History:  Diagnosis Date  . Bipolar affective disorder ([place])   . Coronary artery disease   . Hyperlipidemia   . Hypertension   . Hypothyroid   . Myocardial infarction   . Stroke [facility])     Patient Active Problem List   Diagnosis Date Noted  . Chest pain 05/12/2015  . Right facial numbness 03/30/2015  . Lithium intoxication 02/27/2015  . Bipolar disorder ([place]) 02/27/2015  . CVA (cerebral infarction) 02/26/2015  . Unstable angina ([place]) 02/03/2015    Past Surgical History:  Procedure Laterality Date  . ABDOMINAL HYSTERECTOMY    . APPENDECTOMY    . CARDIAC CATHETERIZATION Left 09/26/2015   Procedure: Left Heart Cath and Coronary Angiography;  Surgeon: Teodoro Spray, MD;  Location: [place] [facility];  Service: Cardiovascular;  Laterality: Left;  . HYSTEROTOMY      Prior to Admission medications   Medication Sig Start Date End Date Taking? Authorizing Provider  albuterol (PROVENTIL HFA;VENTOLIN HFA) 108 (90 BASE) MCG/ACT inhaler Inhale 2 puffs into the lungs every 6 (six) hours as needed for wheezing or shortness of breath.    Historical Provider, MD  budesonide-formoterol (SYMBICORT) 160-4.5 MCG/ACT inhaler Inhale 2 puffs into the lungs 2 (two) times daily.     Historical Provider, MD   cetirizine (ZYRTEC) 10 MG tablet Take 10 mg by mouth daily.    Historical Provider, MD  citalopram (CELEXA) 40 MG tablet Take 0.5 tablets (20 mg total) by mouth daily. 05/13/15   Dustin Flock, MD  clopidogrel (PLAVIX) 75 MG tablet Take 75 mg by mouth every evening.     Historical Provider, MD  dicyclomine (BENTYL) 20 MG tablet Take 1 tablet (20 mg total) by mouth 3 (three) times daily as needed for spasms. 01/04/16 01/03/17  Earleen Newport, MD  doxycycline (VIBRAMYCIN) 100 MG capsule Take 1 capsule (100 mg total) by mouth 2 (two) times daily. 12/13/15   Victorino Dike, FNP  ferrous sulfate 325 (65 FE) MG tablet Take 325 mg by mouth every 7 (seven) days. Pt takes on Saturday.    Historical Provider, MD  isosorbide mononitrate (IMDUR) 30 MG 24 hr tablet Take 30 mg by mouth daily.    Historical Provider, MD  levothyroxine (SYNTHROID, LEVOTHROID) 100 MCG tablet Take 100 mcg by mouth daily.    Historical Provider, MD  lisinopril (PRINIVIL,ZESTRIL) 5 MG tablet Take 5 mg by mouth daily.    Historical Provider, MD  lubiprostone (AMITIZA) 8 MCG capsule Take 8 mcg by mouth 2 (two) times daily as needed for constipation.    Historical Provider, MD  metoprolol tartrate (LOPRESSOR) 25 MG tablet Take 25 mg by mouth 2 (two) times daily.    Historical Provider, MD  ondansetron (ZOFRAN) 4 MG tablet Take 1 tablet (4 mg total) by mouth daily as  needed for nausea or vomiting. 01/04/16   Earleen Newport, MD  simvastatin (ZOCOR) 20 MG tablet Take 20 mg by mouth at bedtime.    Historical Provider, MD  traMADol (ULTRAM) 50 MG tablet Take 1 tablet (50 mg total) by mouth every 6 (six) hours as needed. 02/04/15   Gladstone Lighter, MD  traMADol (ULTRAM) 50 MG tablet Take 1 tablet (50 mg total) by mouth every 6 (six) hours as needed for moderate pain. Patient not taking: Reported on 08/21/2015 07/13/15   Sable Feil, PA-C    Allergies Morphine; Pregabalin; Rofecoxib; Sulfamethoxazole-trimethoprim; and Asa  [aspirin]  Family History  Problem Relation Age of Onset  . CAD      Social History Social History  Substance Use Topics  . Smoking status: Former Smoker    Packs/day: 1.00    Years: 40.00  . Smokeless tobacco: Never Used  . Alcohol use No    Review of Systems  Constitutional: Negative for fever. Cardiovascular: Negative for chest pain. Respiratory: Negative for shortness of breath. Gastrointestinal: Negative for abdominal pain, vomiting and diarrhea. Genitourinary: Negative for dysuria. Musculoskeletal: Negative for back pain. Skin: Negative for rash. Neurological: Positive for headache. Positive for right facial numbness, right upper extremity weakness.  10-point ROS otherwise negative.  ____________________________________________   PHYSICAL EXAM:  VITAL SIGNS: ED Triage Vitals  Enc Vitals Group     BP 06/17/16 1536 (!) 141/62     Pulse Rate 06/17/16 1536 (!) 54     Resp 06/17/16 1536 16     Temp 06/17/16 1536 98.9 F (37.2 C)     Temp Source 06/17/16 1536 Oral     SpO2 06/17/16 1536 95 %     Weight 06/17/16 1536 135 lb (61.2 kg)     Height 06/17/16 1536 5' (1.524 m)     Head Circumference --      Peak Flow --      Pain Score 06/17/16 1537 8   Constitutional: Alert and oriented. Well appearing and in no distress. Eyes: Conjunctivae are normal. Normal extraocular movements. ENT   Head: Normocephalic and atraumatic.   Nose: No congestion/rhinnorhea.   Mouth/Throat: Mucous membranes are moist.   Neck: No stridor. Hematological/Lymphatic/Immunilogical: No cervical lymphadenopathy. Cardiovascular: Normal rate, regular rhythm.  No murmurs, rubs, or gallops.  Respiratory: Normal respiratory effort without tachypnea nor retractions. Breath sounds are clear and equal bilaterally. No wheezes/rales/rhonchi. Gastrointestinal: Soft and nontender. No distention.  Genitourinary: Deferred Musculoskeletal: Normal range of motion in all extremities. No  lower extremity edema. Neurologic:  Normal speech and language. Face symmetric. Tongue midline. No upper extremity pronator drift, however grip was 4/5 in the right, 5/5 in the left. No lower extremity drift. Sensation grossly intact, however patient stated it felt different on her face.  Skin:  Skin is warm, dry and intact. No rash noted. Psychiatric: Mood and affect are normal. Speech and behavior are normal. Patient exhibits appropriate insight and judgment.  ____________________________________________    LABS (pertinent positives/negatives)  Labs Reviewed  COMPREHENSIVE METABOLIC PANEL - Abnormal; Notable for the following:       Result Value   Glucose, Bld 108 (*)    ALT 11 (*)    Total Bilirubin <0.1 (*)    All other components within normal limits  GLUCOSE, CAPILLARY - Abnormal; Notable for the following:    Glucose-Capillary 113 (*)    All other components within normal limits  PROTIME-INR  APTT  CBC  DIFFERENTIAL  TROPONIN I  CBG MONITORING,  ED     ____________________________________________   EKG  I, Nance Pear, attending physician, personally viewed and interpreted this EKG  EKG Time: 1549 Rate: 52 Rhythm: sinus bradycardia Axis: normal Intervals: qtc 452 QRS: narrow ST changes: no st elevation Impression: normal ekg   ____________________________________________    RADIOLOGY  CT head   IMPRESSION:  1. Negative for acute infarct.  2. ASPECTS is 10    I, Prescious Hurless, personally discussed these images and results by phone with the on-call radiologist and used this discussion as part of my medical decision making.   ____________________________________________   PROCEDURES  Procedures  CRITICAL CARE Performed by: Nance Pear   Total critical care time: 35 minutes  Critical care time was exclusive of separately billable procedures and treating other patients.  Critical care was necessary to treat or prevent imminent or  life-threatening deterioration.  Critical care was time spent personally by me on the following activities: development of treatment plan with patient and/or surrogate as well as nursing, discussions with consultants, evaluation of patient's response to treatment, examination of patient, obtaining history from patient or surrogate, ordering and performing treatments and interventions, ordering and review of laboratory studies, ordering and review of radiographic studies, pulse oximetry and re-evaluation of patient's condition.  ____________________________________________   INITIAL IMPRESSION / ASSESSMENT AND PLAN / ED COURSE  Pertinent labs & imaging results that were available during my care of the patient were reviewed by me and considered in my medical decision making (see chart for details).  Patient presented as code stroke with headache and right facial numbness and right upper extremity numbness. History of CVA and complex migraines. On exam patient with some decreased strength in right grip. Subjective difference in sensation. Patient was evaluated by teleneurology. Per neurologist TPA was discussed however it was decided to defer it at this point. I think this is reasonable given unclear etiology of the patient's symptoms. Will give medication for patient's symptoms and admit to the hospitalist service.  Clinical Course   ----------------------------------------- 5:37 PM on 06/17/2016 -----------------------------------------  The patient expressed to the nurse that she would like to leave and not be admitted at this point. I talked to the patient who is now feeling better, and feels like her hand has regained its strength. I discussed that we still have concern for stroke and by leaving she could risk death or permanent neurologic deficit. Patient verbalized understanding. Did discuss return precaution. ____________________________________________   FINAL CLINICAL IMPRESSION(S) /  ED DIAGNOSES  Final diagnoses:  Numbness  Right sided weakness  Nonintractable headache, unspecified chronicity pattern, unspecified headache type     Note: This dictation was prepared with Dragon dictation. Any transcriptional errors that result from this process are unintentional    Nance Pear, MD 06/17/16 1739

## 2016-06-17 NOTE — ED Notes (Signed)
MD Archie Balboa came to bedside. Discussed the need for patient to stay. Pt declined. Pt requesting to be discharged AMA.

## 2016-06-17 NOTE — ED Notes (Signed)
Pt reports decrease in symptoms and is requesting to be discharged. Pt reports she will follow-up with her neurologist. MD Archie Balboa informed

## 2016-06-17 NOTE — ED Notes (Signed)
CBG: 113 °

## 2016-06-17 NOTE — ED Notes (Signed)
Pt reports hx MI, and of previous stroke affecting right side. Pt c/o headache, right facial numbness, right arm/hand weakness, right leg numbness, malaise. Pt reports headache is different from previous migraines. Pt has dysarthria.   Pt reports she takes plavix, BP medications, and various heart medications, pt reports she can't take aspirin

## 2016-06-17 NOTE — ED Notes (Signed)
ED Provider at bedside. 

## 2016-06-28 ENCOUNTER — Emergency Department
Admission: EM | Admit: 2016-06-28 | Discharge: 2016-06-28 | Disposition: A | Discharge status: Home / Self Care | Payer: Commercial Managed Care - HMO | Attending: Emergency Medicine | Admitting: Emergency Medicine

## 2016-06-28 ENCOUNTER — Emergency Department: Payer: Commercial Managed Care - HMO

## 2016-06-28 DIAGNOSIS — W540XXA Bitten by dog, initial encounter: Secondary | ICD-10-CM | POA: Insufficient documentation

## 2016-06-28 DIAGNOSIS — I10 Essential (primary) hypertension: Secondary | ICD-10-CM | POA: Insufficient documentation

## 2016-06-28 DIAGNOSIS — Z79899 Other long term (current) drug therapy: Secondary | ICD-10-CM | POA: Insufficient documentation

## 2016-06-28 DIAGNOSIS — E039 Hypothyroidism, unspecified: Secondary | ICD-10-CM | POA: Insufficient documentation

## 2016-06-28 DIAGNOSIS — Y929 Unspecified place or not applicable: Secondary | ICD-10-CM | POA: Diagnosis not present

## 2016-06-28 DIAGNOSIS — S60221A Contusion of right hand, initial encounter: Secondary | ICD-10-CM | POA: Diagnosis not present

## 2016-06-28 DIAGNOSIS — Y939 Activity, unspecified: Secondary | ICD-10-CM | POA: Insufficient documentation

## 2016-06-28 DIAGNOSIS — Y999 Unspecified external cause status: Secondary | ICD-10-CM | POA: Insufficient documentation

## 2016-06-28 DIAGNOSIS — Z87891 Personal history of nicotine dependence: Secondary | ICD-10-CM | POA: Diagnosis not present

## 2016-06-28 DIAGNOSIS — I2510 Atherosclerotic heart disease of native coronary artery without angina pectoris: Secondary | ICD-10-CM | POA: Diagnosis not present

## 2016-06-28 DIAGNOSIS — S6991XA Unspecified injury of right wrist, hand and finger(s), initial encounter: Secondary | ICD-10-CM | POA: Diagnosis present

## 2016-06-28 MED ORDER — HYDROCODONE-ACETAMINOPHEN 5-325 MG PO TABS: 1.0000 | ORAL_TABLET | ORAL | 0 refills | Status: DC | PRN

## 2016-06-28 NOTE — ED Provider Notes (Signed)
[facility] Emergency Department Provider Note ____________________________________________  Time seen: Approximately 2:29 PM  I have reviewed the triage vital signs and the nursing notes.   HISTORY  Chief Complaint Hand Pain    HPI Carol [name] is a 60 y.o. female who presents to the emergency department for evaluation of right hand pain. She states that on Thursday, her dog bit her hand. She states that she has had bruising and pain since that time, but denies any puncture wounds or associated lacerations. She denies fever. She has been taking ibuprofen without relief. She reports pain increases with movement of her right thumb. Pain is in the dorsal aspect of her hand.  Past Medical History:  Diagnosis Date  . Bipolar affective disorder ([place])   . Coronary artery disease   . Hyperlipidemia   . Hypertension   . Hypothyroid   . Myocardial infarction   . Stroke [facility])     Patient Active Problem List   Diagnosis Date Noted  . Migraine 06/17/2016  . Chest pain 05/12/2015  . Right facial numbness 03/30/2015  . Lithium intoxication 02/27/2015  . Bipolar disorder ([place]) 02/27/2015  . CVA (cerebral infarction) 02/26/2015  . Unstable angina ([place]) 02/03/2015    Past Surgical History:  Procedure Laterality Date  . ABDOMINAL HYSTERECTOMY    . APPENDECTOMY    . CARDIAC CATHETERIZATION Left 09/26/2015   Procedure: Left Heart Cath and Coronary Angiography;  Surgeon: Teodoro Spray, MD;  Location: [facility];  Service: Cardiovascular;  Laterality: Left;  . HYSTEROTOMY      Prior to Admission medications   Medication Sig Start Date End Date Taking? Authorizing Provider  albuterol (PROVENTIL HFA;VENTOLIN HFA) 108 (90 BASE) MCG/ACT inhaler Inhale 2 puffs into the lungs every 6 (six) hours as needed for wheezing or shortness of breath.    Historical Provider, MD  albuterol (PROVENTIL) (2.5 MG/3ML) 0.083% nebulizer solution Take 2.5 mg by nebulization  every 6 (six) hours as needed for wheezing or shortness of breath.    Historical Provider, MD  budesonide-formoterol (SYMBICORT) 160-4.5 MCG/ACT inhaler Inhale 2 puffs into the lungs 2 (two) times daily.     Historical Provider, MD  cetirizine (ZYRTEC) 10 MG tablet Take 10 mg by mouth daily as needed.     Historical Provider, MD  citalopram (CELEXA) 40 MG tablet Take 40 mg by mouth daily.    Historical Provider, MD  clopidogrel (PLAVIX) 75 MG tablet Take 75 mg by mouth daily.     Historical Provider, MD  ferrous sulfate 325 (65 FE) MG tablet Take 325 mg by mouth every 7 (seven) days. Pt takes on Saturday.    Historical Provider, MD  HYDROcodone-acetaminophen (NORCO/VICODIN) 5-325 MG tablet Take 1 tablet by mouth every 4 (four) hours as needed for moderate pain. 06/28/16 06/28/17  Victorino Dike, FNP  isosorbide mononitrate (IMDUR) 30 MG 24 hr tablet Take 30 mg by mouth daily.    Historical Provider, MD  levothyroxine (SYNTHROID, LEVOTHROID) 100 MCG tablet Take 100 mcg by mouth daily.    Historical Provider, MD  lisinopril (PRINIVIL,ZESTRIL) 5 MG tablet Take 5 mg by mouth daily.    Historical Provider, MD  lubiprostone (AMITIZA) 8 MCG capsule Take 8 mcg by mouth 2 (two) times daily as needed for constipation.    Historical Provider, MD  metoprolol tartrate (LOPRESSOR) 25 MG tablet Take 25 mg by mouth 2 (two) times daily.    Historical Provider, MD  simvastatin (ZOCOR) 20 MG tablet Take  20 mg by mouth at bedtime.    Historical Provider, MD  Vitamin D, Ergocalciferol, (DRISDOL) 50000 units CAPS capsule Take 50,000 Units by mouth every 7 (seven) days.    Historical Provider, MD    Allergies Morphine; Pregabalin; Rofecoxib; Sulfamethoxazole-trimethoprim; and Asa [aspirin]  Family History  Problem Relation Age of Onset  . CAD      Social History Social History  Substance Use Topics  . Smoking status: Former Smoker    Packs/day: 1.00    Years: 40.00  . Smokeless tobacco: Never Used  .  Alcohol use No    Review of Systems Constitutional: No recent illness. Cardiovascular: Denies chest pain or palpitations. Respiratory: Denies shortness of breath. Musculoskeletal: Pain in Right hand Skin: Negative for rash, wound, lesion. Neurological: Negative for focal weakness or numbness.  ____________________________________________   PHYSICAL EXAM:  VITAL SIGNS: ED Triage Vitals  Enc Vitals Group     BP 06/28/16 1333 (!) 128/54     Pulse Rate 06/28/16 1333 68     Resp 06/28/16 1333 20     Temp 06/28/16 1333 98 F (36.7 C)     Temp Source 06/28/16 1333 Oral     SpO2 06/28/16 1333 97 %     Weight 06/28/16 1334 135 lb (61.2 kg)     Height 06/28/16 1334 5' (1.524 m)     Head Circumference --      Peak Flow --      Pain Score --      Pain Loc --      Pain Edu? --      Excl. in [place]? --     Constitutional: Alert and oriented. Well appearing and in no acute distress. Eyes: Conjunctivae are normal. EOMI. Head: Atraumatic. Neck: No stridor.  Respiratory: Normal respiratory effort.   Musculoskeletal: Limited range of motion of the right thumb secondary to pain. Neurologic:  Normal speech and language. No gross focal neurologic deficits are appreciated. Speech is normal. No gait instability. Skin:  Skin is warm, dry and intact. No puncture wounds/skin tears/lacerations. Aging contusion noted over the dorsal aspect of the right hand and into the thenar eminence on the palmar side. Psychiatric: Mood and affect are normal. Speech and behavior are normal.  ____________________________________________   LABS (all labs ordered are listed, but only abnormal results are displayed)  Labs Reviewed - No data to display ____________________________________________  RADIOLOGY  X-ray negative for acute bony abnormality or foreign body per radiology. ____________________________________________   PROCEDURES  Procedure(s) performed:  None   ____________________________________________   INITIAL IMPRESSION / ASSESSMENT AND PLAN / ED COURSE  Clinical Course     Pertinent labs & imaging results that were available during my care of the patient were reviewed by me and considered in my medical decision making (see chart for details).  Patient was encouraged to attempt gentle range of motion of her thumb. She was given a prescription for Norco and advised to use for severe pain. She was advised to follow up with her primary care provider for symptoms that are not improving over the next few days. She was encouraged to return to the emergency department for symptoms that change or worsen if she is unable schedule an appointment. ____________________________________________   FINAL CLINICAL IMPRESSION(S) / ED DIAGNOSES  Final diagnoses:  Contusion of right hand, initial encounter       Victorino Dike, FNP 06/28/16 [place], MD 06/30/16 [id number]

## 2016-06-28 NOTE — ED Triage Notes (Signed)
Reports her dog put her right hand in her mouth to bite her bc she thought another dog was getting her pizza crust. No punctures.  Contusion noted.

## 2016-06-28 NOTE — Discharge Instructions (Signed)
Follow up with your PCP or the orthopedic doctor for symptoms that are not improving over the next few days. Return to the ER for symptoms that change or worsen if unable to schedule an appointment.

## 2016-06-28 NOTE — ED Notes (Signed)
See triage note states her dog grabbed hold of her right wrist and then released  No bite marks or puncture wounds noted   bruisingnoted with some swelling noted to right thumb area

## 2016-08-16 DIAGNOSIS — I219 Acute myocardial infarction, unspecified: Secondary | ICD-10-CM

## 2016-08-16 HISTORY — DX: Acute myocardial infarction, unspecified: I21.9

## 2016-08-24 DIAGNOSIS — S99921A Unspecified injury of right foot, initial encounter: Secondary | ICD-10-CM | POA: Diagnosis not present

## 2016-08-24 DIAGNOSIS — S99911A Unspecified injury of right ankle, initial encounter: Secondary | ICD-10-CM | POA: Diagnosis not present

## 2016-08-24 DIAGNOSIS — M799 Soft tissue disorder, unspecified: Secondary | ICD-10-CM | POA: Diagnosis not present

## 2016-08-24 DIAGNOSIS — M79671 Pain in right foot: Secondary | ICD-10-CM | POA: Diagnosis not present

## 2016-08-24 DIAGNOSIS — M19071 Primary osteoarthritis, right ankle and foot: Secondary | ICD-10-CM | POA: Diagnosis not present

## 2016-08-24 DIAGNOSIS — T1490XA Injury, unspecified, initial encounter: Secondary | ICD-10-CM | POA: Diagnosis not present

## 2016-08-24 DIAGNOSIS — S93401A Sprain of unspecified ligament of right ankle, initial encounter: Secondary | ICD-10-CM | POA: Diagnosis not present

## 2016-08-29 ENCOUNTER — Emergency Department
Admission: EM | Admit: 2016-08-29 | Discharge: 2016-08-29 | Disposition: A | Discharge status: Home / Self Care | Payer: Medicare Other | Attending: Emergency Medicine | Admitting: Emergency Medicine

## 2016-08-29 DIAGNOSIS — E039 Hypothyroidism, unspecified: Secondary | ICD-10-CM | POA: Insufficient documentation

## 2016-08-29 DIAGNOSIS — Z79899 Other long term (current) drug therapy: Secondary | ICD-10-CM | POA: Diagnosis not present

## 2016-08-29 DIAGNOSIS — B029 Zoster without complications: Secondary | ICD-10-CM | POA: Diagnosis not present

## 2016-08-29 DIAGNOSIS — I2510 Atherosclerotic heart disease of native coronary artery without angina pectoris: Secondary | ICD-10-CM | POA: Insufficient documentation

## 2016-08-29 DIAGNOSIS — Z87891 Personal history of nicotine dependence: Secondary | ICD-10-CM | POA: Insufficient documentation

## 2016-08-29 DIAGNOSIS — I10 Essential (primary) hypertension: Secondary | ICD-10-CM | POA: Diagnosis not present

## 2016-08-29 DIAGNOSIS — R21 Rash and other nonspecific skin eruption: Secondary | ICD-10-CM | POA: Diagnosis present

## 2016-08-29 MED ORDER — HYDROCODONE-ACETAMINOPHEN 5-325 MG PO TABS: 1.0000 | ORAL_TABLET | Freq: Four times a day (QID) | ORAL | 0 refills | Status: DC | PRN

## 2016-08-29 MED ORDER — VALACYCLOVIR HCL 1 G PO TABS: 1000.0000 mg | ORAL_TABLET | Freq: Three times a day (TID) | ORAL | 0 refills | Status: DC

## 2016-08-29 MED ORDER — LIDOCAINE 5 % EX PTCH: 1.0000 | MEDICATED_PATCH | Freq: Two times a day (BID) | CUTANEOUS | 0 refills | Status: AC

## 2016-08-29 NOTE — ED Notes (Signed)
See triage note developed pain to left shoulder area couple of days ago  Then rash then next day now the pain is moving into left arm

## 2016-08-29 NOTE — ED Provider Notes (Signed)
[facility] Emergency Department Provider Note  ____________________________________________  Time seen: Approximately 4:55 PM  I have reviewed the triage vital signs and the nursing notes.   HISTORY  Chief Complaint Rash    HPI Carol [name] is a 61 y.o. female , NAD, presents to the emergency department for evaluation of 2 day history of rash. Patient states she noted the pain about the back of her left shoulder 2 days ago. States that the same day she noted a red rash. Felt slightly itchy but since that time the pain has significantly worsened. Pain radiates into the left upper arm and neck but the patient has not noted any spread of rash to that area. States she had bronchitis 2 weeks ago and fractured her right lower leg 1 week ago. Has been taking Ultram for her right lower leg fracture but states that that is not alleviating her current shoulder pain.  Denies any injuries, traumas to the shoulder. States she did have chickenpox as a child but has not had Zostavax as her primary care provider advised her that she did not need it since she had an MMR vaccine in the 87s. Denies any headaches, facial pain, chest pain, shortness of breath, abdominal pain, nausea or vomiting. Denies visual changes, numbness, weakness, tingling nor diaphoresis.    Past Medical History:  Diagnosis Date  . Bipolar affective disorder ([place])   . Coronary artery disease   . Hyperlipidemia   . Hypertension   . Hypothyroid   . Myocardial infarction   . Stroke [facility])     Patient Active Problem List   Diagnosis Date Noted  . Migraine 06/17/2016  . Chest pain 05/12/2015  . Right facial numbness 03/30/2015  . Lithium intoxication 02/27/2015  . Bipolar disorder ([place]) 02/27/2015  . CVA (cerebral infarction) 02/26/2015  . Unstable angina ([place]) 02/03/2015    Past Surgical History:  Procedure Laterality Date  . ABDOMINAL HYSTERECTOMY    . APPENDECTOMY    . CARDIAC CATHETERIZATION Left  09/26/2015   Procedure: Left Heart Cath and Coronary Angiography;  Surgeon: Teodoro Spray, MD;  Location: [facility];  Service: Cardiovascular;  Laterality: Left;  . HYSTEROTOMY      Prior to Admission medications   Medication Sig Start Date End Date Taking? Authorizing Provider  albuterol (PROVENTIL HFA;VENTOLIN HFA) 108 (90 BASE) MCG/ACT inhaler Inhale 2 puffs into the lungs every 6 (six) hours as needed for wheezing or shortness of breath.    Historical Provider, MD  albuterol (PROVENTIL) (2.5 MG/3ML) 0.083% nebulizer solution Take 2.5 mg by nebulization every 6 (six) hours as needed for wheezing or shortness of breath.    Historical Provider, MD  budesonide-formoterol (SYMBICORT) 160-4.5 MCG/ACT inhaler Inhale 2 puffs into the lungs 2 (two) times daily.     Historical Provider, MD  cetirizine (ZYRTEC) 10 MG tablet Take 10 mg by mouth daily as needed.     Historical Provider, MD  citalopram (CELEXA) 40 MG tablet Take 40 mg by mouth daily.    Historical Provider, MD  clopidogrel (PLAVIX) 75 MG tablet Take 75 mg by mouth daily.     Historical Provider, MD  ferrous sulfate 325 (65 FE) MG tablet Take 325 mg by mouth every 7 (seven) days. Pt takes on Saturday.    Historical Provider, MD  HYDROcodone-acetaminophen (NORCO) 5-325 MG tablet Take 1 tablet by mouth every 6 (six) hours as needed for severe pain. 08/29/16   Jami L Hagler, PA-C  isosorbide mononitrate (  IMDUR) 30 MG 24 hr tablet Take 30 mg by mouth daily.    Historical Provider, MD  levothyroxine (SYNTHROID, LEVOTHROID) 100 MCG tablet Take 100 mcg by mouth daily.    Historical Provider, MD  lidocaine (LIDODERM) 5 % Place 1 patch onto the skin every 12 (twelve) hours. Remove & Discard patch within 12 hours or as directed by MD 08/29/16 08/29/17  Jami L Hagler, PA-C  lisinopril (PRINIVIL,ZESTRIL) 5 MG tablet Take 5 mg by mouth daily.    Historical Provider, MD  lubiprostone (AMITIZA) 8 MCG capsule Take 8 mcg by mouth 2 (two) times daily  as needed for constipation.    Historical Provider, MD  metoprolol tartrate (LOPRESSOR) 25 MG tablet Take 25 mg by mouth 2 (two) times daily.    Historical Provider, MD  simvastatin (ZOCOR) 20 MG tablet Take 20 mg by mouth at bedtime.    Historical Provider, MD  valACYclovir (VALTREX) 1000 MG tablet Take 1 tablet (1,000 mg total) by mouth 3 (three) times daily. 08/29/16   Jami L Hagler, PA-C  Vitamin D, Ergocalciferol, (DRISDOL) 50000 units CAPS capsule Take 50,000 Units by mouth every 7 (seven) days.    Historical Provider, MD    Allergies Morphine; Pregabalin; Rofecoxib; Sulfamethoxazole-trimethoprim; and Asa [aspirin]  Family History  Problem Relation Age of Onset  . CAD      Social History Social History  Substance Use Topics  . Smoking status: Former Smoker    Packs/day: 1.00    Years: 40.00  . Smokeless tobacco: Never Used  . Alcohol use No     Review of Systems  Constitutional: No fever/chills Eyes: No visual changes.  Cardiovascular: No chest pain. Respiratory: No shortness of breath. No wheezing.  Gastrointestinal: No abdominal pain.  No nausea, vomiting.   Musculoskeletal: Positive left shoulder, upper arm and neck pain. Negative for back pain.  Skin: Positive for rash. Neurological: Negative for headaches, focal weakness or numbness. No tingling. 10-point ROS otherwise negative.  ____________________________________________   PHYSICAL EXAM:  VITAL SIGNS: ED Triage Vitals [08/29/16 1535]  Enc Vitals Group     BP (!) 123/41     Pulse Rate 60     Resp 18     Temp 97.8 F (36.6 C)     Temp Source Oral     SpO2 100 %     Weight 135 lb (61.2 kg)     Height      Head Circumference      Peak Flow      Pain Score 10     Pain Loc      Pain Edu?      Excl. in [place]?      Constitutional: Alert and oriented. Well appearing and in no acute distress. Eyes: Conjunctivae are normal.  Head: Atraumatic. Neck: Supple with full range of  motion Hematological/Lymphatic/Immunilogical: No cervical lymphadenopathy. Cardiovascular:Good peripheral circulation. Respiratory: Normal respiratory effort without tachypnea or retractions.  Musculoskeletal: Full range of motion of bilateral upper extremities without difficulty. Neurologic:  Normal speech and language. No gross focal neurologic deficits are appreciated.  Skin:  3 erythematous, annular lesions with forming bulla centrally noted about the left posterior shoulder. Area is significantly tender to palpation. No active oozing or weeping. No scabbing. Skin is warm, dry and intact.  Psychiatric: Mood and affect are normal. Speech and behavior are normal. Patient exhibits appropriate insight and judgement.   ____________________________________________   LABS  None ____________________________________________  EKG  None ____________________________________________  RADIOLOGY  None ____________________________________________  PROCEDURES  Procedure(s) performed: None   Procedures   Medications - No data to display   ____________________________________________   INITIAL IMPRESSION / ASSESSMENT AND PLAN / ED COURSE  Pertinent labs & imaging results that were available during my care of the patient were reviewed by me and considered in my medical decision making (see chart for details).  Clinical Course     Patient's diagnosis is consistent with Herpes zoster without consultation. Patient will be discharged home with prescriptions for Valtrex, Norco and Lidoderm patches to use as directed. Patient is to follow up with her primary care provider if symptoms persist past this treatment course. Patient is given ED precautions to return to the ED for any worsening or new symptoms.    ____________________________________________  FINAL CLINICAL IMPRESSION(S) / ED DIAGNOSES  Final diagnoses:  Herpes zoster without complication      NEW MEDICATIONS  STARTED DURING THIS VISIT:  Discharge Medication List as of 08/29/2016  5:14 PM    START taking these medications   Details  lidocaine (LIDODERM) 5 % Place 1 patch onto the skin every 12 (twelve) hours. Remove & Discard patch within 12 hours or as directed by MD, Starting Sun 08/29/2016, Until Mon 08/29/2017, Print    valACYclovir (VALTREX) 1000 MG tablet Take 1 tablet (1,000 mg total) by mouth 3 (three) times daily., Starting Sun 08/29/2016, Print             Judithe Modest [place], PA-C 08/29/16 1747    Earleen Newport, MD 08/29/16 Vernelle Emerald

## 2016-08-29 NOTE — ED Triage Notes (Signed)
Pt arrives to ER with red circular rash to left shoulder blade X 2 days. Very painful.

## 2016-09-01 DIAGNOSIS — J449 Chronic obstructive pulmonary disease, unspecified: Secondary | ICD-10-CM | POA: Diagnosis not present

## 2016-09-14 DIAGNOSIS — S93401D Sprain of unspecified ligament of right ankle, subsequent encounter: Secondary | ICD-10-CM | POA: Diagnosis not present

## 2016-09-14 DIAGNOSIS — S93601S Unspecified sprain of right foot, sequela: Secondary | ICD-10-CM | POA: Diagnosis not present

## 2016-09-14 DIAGNOSIS — M19071 Primary osteoarthritis, right ankle and foot: Secondary | ICD-10-CM | POA: Diagnosis not present

## 2016-09-29 DIAGNOSIS — E782 Mixed hyperlipidemia: Secondary | ICD-10-CM | POA: Diagnosis not present

## 2016-09-29 DIAGNOSIS — I2510 Atherosclerotic heart disease of native coronary artery without angina pectoris: Secondary | ICD-10-CM | POA: Diagnosis not present

## 2016-09-29 DIAGNOSIS — I779 Disorder of arteries and arterioles, unspecified: Secondary | ICD-10-CM | POA: Diagnosis not present

## 2016-09-29 DIAGNOSIS — I10 Essential (primary) hypertension: Secondary | ICD-10-CM | POA: Diagnosis not present

## 2016-10-02 DIAGNOSIS — J449 Chronic obstructive pulmonary disease, unspecified: Secondary | ICD-10-CM | POA: Diagnosis not present

## 2016-10-30 DIAGNOSIS — J449 Chronic obstructive pulmonary disease, unspecified: Secondary | ICD-10-CM | POA: Diagnosis not present

## 2016-11-15 ENCOUNTER — Emergency Department
Admission: EM | Admit: 2016-11-15 | Discharge: 2016-11-15 | Disposition: A | Discharge status: Home / Self Care | Payer: Medicare Other | Attending: Emergency Medicine | Admitting: Emergency Medicine

## 2016-11-15 ENCOUNTER — Telehealth: Payer: Self-pay | Admitting: Emergency Medicine

## 2016-11-15 ENCOUNTER — Encounter: Payer: Self-pay | Admitting: *Deleted

## 2016-11-15 ENCOUNTER — Emergency Department: Payer: Medicare Other

## 2016-11-15 DIAGNOSIS — G8191 Hemiplegia, unspecified affecting right dominant side: Secondary | ICD-10-CM | POA: Diagnosis not present

## 2016-11-15 DIAGNOSIS — R2990 Unspecified symptoms and signs involving the nervous system: Secondary | ICD-10-CM | POA: Diagnosis not present

## 2016-11-15 DIAGNOSIS — E039 Hypothyroidism, unspecified: Secondary | ICD-10-CM | POA: Insufficient documentation

## 2016-11-15 DIAGNOSIS — R200 Anesthesia of skin: Secondary | ICD-10-CM | POA: Diagnosis not present

## 2016-11-15 DIAGNOSIS — Z87891 Personal history of nicotine dependence: Secondary | ICD-10-CM | POA: Insufficient documentation

## 2016-11-15 DIAGNOSIS — J309 Allergic rhinitis, unspecified: Secondary | ICD-10-CM | POA: Diagnosis not present

## 2016-11-15 DIAGNOSIS — Z5321 Procedure and treatment not carried out due to patient leaving prior to being seen by health care provider: Secondary | ICD-10-CM | POA: Diagnosis not present

## 2016-11-15 DIAGNOSIS — E785 Hyperlipidemia, unspecified: Secondary | ICD-10-CM | POA: Diagnosis not present

## 2016-11-15 DIAGNOSIS — I2510 Atherosclerotic heart disease of native coronary artery without angina pectoris: Secondary | ICD-10-CM | POA: Diagnosis not present

## 2016-11-15 DIAGNOSIS — R29703 NIHSS score 3: Secondary | ICD-10-CM | POA: Diagnosis not present

## 2016-11-15 DIAGNOSIS — M26609 Unspecified temporomandibular joint disorder, unspecified side: Secondary | ICD-10-CM | POA: Diagnosis not present

## 2016-11-15 DIAGNOSIS — I639 Cerebral infarction, unspecified: Secondary | ICD-10-CM

## 2016-11-15 DIAGNOSIS — R531 Weakness: Secondary | ICD-10-CM | POA: Insufficient documentation

## 2016-11-15 DIAGNOSIS — M797 Fibromyalgia: Secondary | ICD-10-CM | POA: Diagnosis not present

## 2016-11-15 DIAGNOSIS — I10 Essential (primary) hypertension: Secondary | ICD-10-CM | POA: Diagnosis not present

## 2016-11-15 DIAGNOSIS — M1990 Unspecified osteoarthritis, unspecified site: Secondary | ICD-10-CM | POA: Diagnosis not present

## 2016-11-15 DIAGNOSIS — R51 Headache: Secondary | ICD-10-CM | POA: Diagnosis not present

## 2016-11-15 DIAGNOSIS — I252 Old myocardial infarction: Secondary | ICD-10-CM | POA: Diagnosis not present

## 2016-11-15 DIAGNOSIS — H9190 Unspecified hearing loss, unspecified ear: Secondary | ICD-10-CM | POA: Diagnosis not present

## 2016-11-15 HISTORY — DX: Cerebral infarction, unspecified: I63.9

## 2016-11-15 LAB — DIFFERENTIAL
Basophils Absolute: 0 10*3/uL (ref 0–0.1)
Basophils Relative: 1 %
EOS ABS: 0.1 10*3/uL (ref 0–0.7)
EOS PCT: 1 %
LYMPHS ABS: 1.9 10*3/uL (ref 1.0–3.6)
LYMPHS PCT: 28 %
MONO ABS: 0.4 10*3/uL (ref 0.2–0.9)
MONOS PCT: 5 %
Neutro Abs: 4.5 10*3/uL (ref 1.4–6.5)
Neutrophils Relative %: 65 %

## 2016-11-15 LAB — PROTIME-INR
INR: 0.92
Prothrombin Time: 12.3 seconds (ref 11.4–15.2)

## 2016-11-15 LAB — CBC
HEMATOCRIT: 37.8 % (ref 35.0–47.0)
Hemoglobin: 12.9 g/dL (ref 12.0–16.0)
MCH: 32.1 pg (ref 26.0–34.0)
MCHC: 34.1 g/dL (ref 32.0–36.0)
MCV: 94.3 fL (ref 80.0–100.0)
Platelets: 205 10*3/uL (ref 150–440)
RBC: 4 MIL/uL (ref 3.80–5.20)
RDW: 14 % (ref 11.5–14.5)
WBC: 6.9 10*3/uL (ref 3.6–11.0)

## 2016-11-15 LAB — COMPREHENSIVE METABOLIC PANEL
ALBUMIN: 4.2 g/dL (ref 3.5–5.0)
ALK PHOS: 88 U/L (ref 38–126)
ALT: 16 U/L (ref 14–54)
ANION GAP: 6 (ref 5–15)
AST: 24 U/L (ref 15–41)
BILIRUBIN TOTAL: 0.4 mg/dL (ref 0.3–1.2)
BUN: 12 mg/dL (ref 6–20)
CALCIUM: 9.4 mg/dL (ref 8.9–10.3)
CO2: 25 mmol/L (ref 22–32)
Chloride: 107 mmol/L (ref 101–111)
Creatinine, Ser: 0.78 mg/dL (ref 0.44–1.00)
GFR calc Af Amer: >60 mL/min (ref >60)
GLUCOSE: 136 mg/dL — AB (ref 65–99)
POTASSIUM: 4 mmol/L (ref 3.5–5.1)
Sodium: 138 mmol/L (ref 135–145)
TOTAL PROTEIN: 7.5 g/dL (ref 6.5–8.1)

## 2016-11-15 LAB — GLUCOSE, CAPILLARY: Glucose-Capillary: 141 mg/dL — ABNORMAL HIGH (ref 65–99)

## 2016-11-15 LAB — TROPONIN I

## 2016-11-15 LAB — APTT: aPTT: 29 seconds (ref 24–36)

## 2016-11-15 NOTE — ED Triage Notes (Signed)
States when she woke up this AM her right foot, which she has a boot on for fracture, was numb, states while driving today the numbness spread up her body, right sided, states hx of CVA, pt on plavix, pt states she went to bed at 10pm, at present no arm drift noted, grips equal and present, smile equal

## 2016-11-15 NOTE — Telephone Encounter (Signed)
Called patient due to lwot to inquire about condition and follow up plans. Left message.   

## 2016-11-19 DIAGNOSIS — M1991 Primary osteoarthritis, unspecified site: Secondary | ICD-10-CM | POA: Diagnosis not present

## 2016-11-19 DIAGNOSIS — Z7902 Long term (current) use of antithrombotics/antiplatelets: Secondary | ICD-10-CM | POA: Diagnosis not present

## 2016-11-19 DIAGNOSIS — G6189 Other inflammatory polyneuropathies: Secondary | ICD-10-CM | POA: Diagnosis not present

## 2016-11-19 DIAGNOSIS — I10 Essential (primary) hypertension: Secondary | ICD-10-CM | POA: Diagnosis not present

## 2016-11-19 DIAGNOSIS — M797 Fibromyalgia: Secondary | ICD-10-CM | POA: Diagnosis not present

## 2016-11-19 DIAGNOSIS — I2510 Atherosclerotic heart disease of native coronary artery without angina pectoris: Secondary | ICD-10-CM | POA: Diagnosis not present

## 2016-11-19 DIAGNOSIS — I69351 Hemiplegia and hemiparesis following cerebral infarction affecting right dominant side: Secondary | ICD-10-CM | POA: Diagnosis not present

## 2016-11-23 DIAGNOSIS — I10 Essential (primary) hypertension: Secondary | ICD-10-CM | POA: Diagnosis not present

## 2016-11-23 DIAGNOSIS — Z7902 Long term (current) use of antithrombotics/antiplatelets: Secondary | ICD-10-CM | POA: Diagnosis not present

## 2016-11-23 DIAGNOSIS — I63412 Cerebral infarction due to embolism of left middle cerebral artery: Secondary | ICD-10-CM | POA: Diagnosis not present

## 2016-11-23 DIAGNOSIS — J440 Chronic obstructive pulmonary disease with acute lower respiratory infection: Secondary | ICD-10-CM | POA: Diagnosis not present

## 2016-11-23 DIAGNOSIS — G6189 Other inflammatory polyneuropathies: Secondary | ICD-10-CM | POA: Diagnosis not present

## 2016-11-23 DIAGNOSIS — I69351 Hemiplegia and hemiparesis following cerebral infarction affecting right dominant side: Secondary | ICD-10-CM | POA: Diagnosis not present

## 2016-11-23 DIAGNOSIS — I2510 Atherosclerotic heart disease of native coronary artery without angina pectoris: Secondary | ICD-10-CM | POA: Diagnosis not present

## 2016-11-23 DIAGNOSIS — M797 Fibromyalgia: Secondary | ICD-10-CM | POA: Diagnosis not present

## 2016-11-23 DIAGNOSIS — E782 Mixed hyperlipidemia: Secondary | ICD-10-CM | POA: Diagnosis not present

## 2016-11-23 DIAGNOSIS — M1991 Primary osteoarthritis, unspecified site: Secondary | ICD-10-CM | POA: Diagnosis not present

## 2016-11-25 DIAGNOSIS — M1991 Primary osteoarthritis, unspecified site: Secondary | ICD-10-CM | POA: Diagnosis not present

## 2016-11-25 DIAGNOSIS — M797 Fibromyalgia: Secondary | ICD-10-CM | POA: Diagnosis not present

## 2016-11-25 DIAGNOSIS — Z7902 Long term (current) use of antithrombotics/antiplatelets: Secondary | ICD-10-CM | POA: Diagnosis not present

## 2016-11-25 DIAGNOSIS — I69351 Hemiplegia and hemiparesis following cerebral infarction affecting right dominant side: Secondary | ICD-10-CM | POA: Diagnosis not present

## 2016-11-25 DIAGNOSIS — I10 Essential (primary) hypertension: Secondary | ICD-10-CM | POA: Diagnosis not present

## 2016-11-25 DIAGNOSIS — G6189 Other inflammatory polyneuropathies: Secondary | ICD-10-CM | POA: Diagnosis not present

## 2016-11-25 DIAGNOSIS — I2510 Atherosclerotic heart disease of native coronary artery without angina pectoris: Secondary | ICD-10-CM | POA: Diagnosis not present

## 2016-11-30 DIAGNOSIS — Z7902 Long term (current) use of antithrombotics/antiplatelets: Secondary | ICD-10-CM | POA: Diagnosis not present

## 2016-11-30 DIAGNOSIS — I69351 Hemiplegia and hemiparesis following cerebral infarction affecting right dominant side: Secondary | ICD-10-CM | POA: Diagnosis not present

## 2016-11-30 DIAGNOSIS — M797 Fibromyalgia: Secondary | ICD-10-CM | POA: Diagnosis not present

## 2016-11-30 DIAGNOSIS — J449 Chronic obstructive pulmonary disease, unspecified: Secondary | ICD-10-CM | POA: Diagnosis not present

## 2016-11-30 DIAGNOSIS — G6189 Other inflammatory polyneuropathies: Secondary | ICD-10-CM | POA: Diagnosis not present

## 2016-11-30 DIAGNOSIS — M1991 Primary osteoarthritis, unspecified site: Secondary | ICD-10-CM | POA: Diagnosis not present

## 2016-11-30 DIAGNOSIS — I10 Essential (primary) hypertension: Secondary | ICD-10-CM | POA: Diagnosis not present

## 2016-11-30 DIAGNOSIS — I2510 Atherosclerotic heart disease of native coronary artery without angina pectoris: Secondary | ICD-10-CM | POA: Diagnosis not present

## 2016-12-03 DIAGNOSIS — M1991 Primary osteoarthritis, unspecified site: Secondary | ICD-10-CM | POA: Diagnosis not present

## 2016-12-03 DIAGNOSIS — M797 Fibromyalgia: Secondary | ICD-10-CM | POA: Diagnosis not present

## 2016-12-03 DIAGNOSIS — G6189 Other inflammatory polyneuropathies: Secondary | ICD-10-CM | POA: Diagnosis not present

## 2016-12-03 DIAGNOSIS — I69351 Hemiplegia and hemiparesis following cerebral infarction affecting right dominant side: Secondary | ICD-10-CM | POA: Diagnosis not present

## 2016-12-03 DIAGNOSIS — I2510 Atherosclerotic heart disease of native coronary artery without angina pectoris: Secondary | ICD-10-CM | POA: Diagnosis not present

## 2016-12-03 DIAGNOSIS — I10 Essential (primary) hypertension: Secondary | ICD-10-CM | POA: Diagnosis not present

## 2016-12-03 DIAGNOSIS — Z7902 Long term (current) use of antithrombotics/antiplatelets: Secondary | ICD-10-CM | POA: Diagnosis not present

## 2016-12-09 DIAGNOSIS — Z7902 Long term (current) use of antithrombotics/antiplatelets: Secondary | ICD-10-CM | POA: Diagnosis not present

## 2016-12-09 DIAGNOSIS — M797 Fibromyalgia: Secondary | ICD-10-CM | POA: Diagnosis not present

## 2016-12-09 DIAGNOSIS — G6189 Other inflammatory polyneuropathies: Secondary | ICD-10-CM | POA: Diagnosis not present

## 2016-12-09 DIAGNOSIS — M1991 Primary osteoarthritis, unspecified site: Secondary | ICD-10-CM | POA: Diagnosis not present

## 2016-12-09 DIAGNOSIS — I69351 Hemiplegia and hemiparesis following cerebral infarction affecting right dominant side: Secondary | ICD-10-CM | POA: Diagnosis not present

## 2016-12-09 DIAGNOSIS — I2510 Atherosclerotic heart disease of native coronary artery without angina pectoris: Secondary | ICD-10-CM | POA: Diagnosis not present

## 2016-12-09 DIAGNOSIS — I10 Essential (primary) hypertension: Secondary | ICD-10-CM | POA: Diagnosis not present

## 2016-12-10 DIAGNOSIS — Z7902 Long term (current) use of antithrombotics/antiplatelets: Secondary | ICD-10-CM | POA: Diagnosis not present

## 2016-12-10 DIAGNOSIS — M1991 Primary osteoarthritis, unspecified site: Secondary | ICD-10-CM | POA: Diagnosis not present

## 2016-12-10 DIAGNOSIS — I69351 Hemiplegia and hemiparesis following cerebral infarction affecting right dominant side: Secondary | ICD-10-CM | POA: Diagnosis not present

## 2016-12-10 DIAGNOSIS — I2510 Atherosclerotic heart disease of native coronary artery without angina pectoris: Secondary | ICD-10-CM | POA: Diagnosis not present

## 2016-12-10 DIAGNOSIS — I10 Essential (primary) hypertension: Secondary | ICD-10-CM | POA: Diagnosis not present

## 2016-12-10 DIAGNOSIS — M797 Fibromyalgia: Secondary | ICD-10-CM | POA: Diagnosis not present

## 2016-12-10 DIAGNOSIS — G6189 Other inflammatory polyneuropathies: Secondary | ICD-10-CM | POA: Diagnosis not present

## 2016-12-14 DIAGNOSIS — I10 Essential (primary) hypertension: Secondary | ICD-10-CM | POA: Diagnosis not present

## 2016-12-14 DIAGNOSIS — M1991 Primary osteoarthritis, unspecified site: Secondary | ICD-10-CM | POA: Diagnosis not present

## 2016-12-14 DIAGNOSIS — Z7902 Long term (current) use of antithrombotics/antiplatelets: Secondary | ICD-10-CM | POA: Diagnosis not present

## 2016-12-14 DIAGNOSIS — G6189 Other inflammatory polyneuropathies: Secondary | ICD-10-CM | POA: Diagnosis not present

## 2016-12-14 DIAGNOSIS — I2510 Atherosclerotic heart disease of native coronary artery without angina pectoris: Secondary | ICD-10-CM | POA: Diagnosis not present

## 2016-12-14 DIAGNOSIS — M797 Fibromyalgia: Secondary | ICD-10-CM | POA: Diagnosis not present

## 2016-12-14 DIAGNOSIS — I69351 Hemiplegia and hemiparesis following cerebral infarction affecting right dominant side: Secondary | ICD-10-CM | POA: Diagnosis not present

## 2016-12-16 DIAGNOSIS — G6189 Other inflammatory polyneuropathies: Secondary | ICD-10-CM | POA: Diagnosis not present

## 2016-12-16 DIAGNOSIS — Z7902 Long term (current) use of antithrombotics/antiplatelets: Secondary | ICD-10-CM | POA: Diagnosis not present

## 2016-12-16 DIAGNOSIS — I2510 Atherosclerotic heart disease of native coronary artery without angina pectoris: Secondary | ICD-10-CM | POA: Diagnosis not present

## 2016-12-16 DIAGNOSIS — I10 Essential (primary) hypertension: Secondary | ICD-10-CM | POA: Diagnosis not present

## 2016-12-16 DIAGNOSIS — M797 Fibromyalgia: Secondary | ICD-10-CM | POA: Diagnosis not present

## 2016-12-16 DIAGNOSIS — M1991 Primary osteoarthritis, unspecified site: Secondary | ICD-10-CM | POA: Diagnosis not present

## 2016-12-16 DIAGNOSIS — I69351 Hemiplegia and hemiparesis following cerebral infarction affecting right dominant side: Secondary | ICD-10-CM | POA: Diagnosis not present

## 2016-12-18 ENCOUNTER — Encounter: Payer: Self-pay | Admitting: Emergency Medicine

## 2016-12-18 DIAGNOSIS — I2510 Atherosclerotic heart disease of native coronary artery without angina pectoris: Secondary | ICD-10-CM | POA: Diagnosis not present

## 2016-12-18 DIAGNOSIS — Z885 Allergy status to narcotic agent status: Secondary | ICD-10-CM

## 2016-12-18 DIAGNOSIS — Z8673 Personal history of transient ischemic attack (TIA), and cerebral infarction without residual deficits: Secondary | ICD-10-CM | POA: Diagnosis not present

## 2016-12-18 DIAGNOSIS — I252 Old myocardial infarction: Secondary | ICD-10-CM

## 2016-12-18 DIAGNOSIS — K8020 Calculus of gallbladder without cholecystitis without obstruction: Secondary | ICD-10-CM | POA: Diagnosis not present

## 2016-12-18 DIAGNOSIS — Z90710 Acquired absence of both cervix and uterus: Secondary | ICD-10-CM

## 2016-12-18 DIAGNOSIS — I10 Essential (primary) hypertension: Secondary | ICD-10-CM | POA: Diagnosis not present

## 2016-12-18 DIAGNOSIS — Z7902 Long term (current) use of antithrombotics/antiplatelets: Secondary | ICD-10-CM

## 2016-12-18 DIAGNOSIS — Z888 Allergy status to other drugs, medicaments and biological substances status: Secondary | ICD-10-CM

## 2016-12-18 DIAGNOSIS — F319 Bipolar disorder, unspecified: Secondary | ICD-10-CM | POA: Diagnosis present

## 2016-12-18 DIAGNOSIS — Z79899 Other long term (current) drug therapy: Secondary | ICD-10-CM

## 2016-12-18 DIAGNOSIS — Z8249 Family history of ischemic heart disease and other diseases of the circulatory system: Secondary | ICD-10-CM | POA: Diagnosis not present

## 2016-12-18 DIAGNOSIS — E785 Hyperlipidemia, unspecified: Secondary | ICD-10-CM | POA: Diagnosis present

## 2016-12-18 DIAGNOSIS — E039 Hypothyroidism, unspecified: Secondary | ICD-10-CM | POA: Diagnosis not present

## 2016-12-18 DIAGNOSIS — Z7951 Long term (current) use of inhaled steroids: Secondary | ICD-10-CM

## 2016-12-18 DIAGNOSIS — Z87891 Personal history of nicotine dependence: Secondary | ICD-10-CM | POA: Diagnosis not present

## 2016-12-18 DIAGNOSIS — Z886 Allergy status to analgesic agent status: Secondary | ICD-10-CM | POA: Diagnosis not present

## 2016-12-18 DIAGNOSIS — K8040 Calculus of bile duct with cholecystitis, unspecified, without obstruction: Secondary | ICD-10-CM | POA: Diagnosis not present

## 2016-12-18 DIAGNOSIS — R1011 Right upper quadrant pain: Secondary | ICD-10-CM | POA: Diagnosis not present

## 2016-12-18 LAB — CBC
HCT: 37.5 % (ref 35.0–47.0)
HEMOGLOBIN: 12.5 g/dL (ref 12.0–16.0)
MCH: 31.3 pg (ref 26.0–34.0)
MCHC: 33.4 g/dL (ref 32.0–36.0)
MCV: 93.7 fL (ref 80.0–100.0)
Platelets: 207 10*3/uL (ref 150–440)
RBC: 4 MIL/uL (ref 3.80–5.20)
RDW: 13 % (ref 11.5–14.5)
WBC: 5.7 10*3/uL (ref 3.6–11.0)

## 2016-12-18 LAB — COMPREHENSIVE METABOLIC PANEL
ALK PHOS: 477 U/L — AB (ref 38–126)
ALT: 759 U/L — AB (ref 14–54)
ANION GAP: 8 (ref 5–15)
AST: 686 U/L — ABNORMAL HIGH (ref 15–41)
Albumin: 4.2 g/dL (ref 3.5–5.0)
BUN: 9 mg/dL (ref 6–20)
CALCIUM: 9.4 mg/dL (ref 8.9–10.3)
CO2: 24 mmol/L (ref 22–32)
Chloride: 107 mmol/L (ref 101–111)
Creatinine, Ser: 0.57 mg/dL (ref 0.44–1.00)
Glucose, Bld: 98 mg/dL (ref 65–99)
Potassium: 3.5 mmol/L (ref 3.5–5.1)
Sodium: 139 mmol/L (ref 135–145)
TOTAL PROTEIN: 7.4 g/dL (ref 6.5–8.1)
Total Bilirubin: 1.3 mg/dL — ABNORMAL HIGH (ref 0.3–1.2)

## 2016-12-18 LAB — LIPASE, BLOOD: LIPASE: 45 U/L (ref 11–51)

## 2016-12-18 LAB — TROPONIN I

## 2016-12-18 MED ORDER — FENTANYL CITRATE (PF) 100 MCG/2ML IJ SOLN
50.0000 ug | INTRAMUSCULAR | Status: AC | PRN
  Administered 2016-12-18 – 2016-12-19 (×2): 50 ug via NASAL
  Filled 2016-12-18 (×2): qty 2

## 2016-12-18 MED ORDER — ONDANSETRON 4 MG PO TBDP
4.0000 mg | ORAL_TABLET | Freq: Once | ORAL | Status: AC | PRN
  Administered 2016-12-18: 4 mg via ORAL
  Filled 2016-12-18: qty 1

## 2016-12-18 NOTE — ED Notes (Signed)
Pt noted to be waiting in wheelchair in sub wait; assisted to recliner for comfort; waiting patiently for treatment room

## 2016-12-18 NOTE — ED Triage Notes (Signed)
Pt with ruq pain and vomiting that began last pm. Pt tearful and states pain is worse with eating. Last po intake at 1730. resps unlabored, pt denies fever.

## 2016-12-19 ENCOUNTER — Emergency Department: Payer: Medicare Other

## 2016-12-19 ENCOUNTER — Inpatient Hospital Stay
Admission: EM | Admit: 2016-12-19 | Discharge: 2016-12-19 | DRG: 446 | Disposition: A | Discharge status: Other Acute Inpatient Hospital | Payer: Medicare Other | Attending: Internal Medicine | Admitting: Internal Medicine

## 2016-12-19 ENCOUNTER — Inpatient Hospital Stay (HOSPITAL_COMMUNITY)
Admission: AD | Admit: 2016-12-19 | Discharge: 2016-12-23 | DRG: 417 | Disposition: A | Discharge status: Home / Self Care | Payer: Medicare Other | Source: Other Acute Inpatient Hospital | Attending: Internal Medicine | Admitting: Internal Medicine

## 2016-12-19 DIAGNOSIS — K8050 Calculus of bile duct without cholangitis or cholecystitis without obstruction: Secondary | ICD-10-CM

## 2016-12-19 DIAGNOSIS — I2510 Atherosclerotic heart disease of native coronary artery without angina pectoris: Secondary | ICD-10-CM

## 2016-12-19 DIAGNOSIS — K8040 Calculus of bile duct with cholecystitis, unspecified, without obstruction: Secondary | ICD-10-CM | POA: Diagnosis present

## 2016-12-19 DIAGNOSIS — K8042 Calculus of bile duct with acute cholecystitis without obstruction: Secondary | ICD-10-CM

## 2016-12-19 DIAGNOSIS — Z9889 Other specified postprocedural states: Secondary | ICD-10-CM | POA: Diagnosis not present

## 2016-12-19 DIAGNOSIS — R079 Chest pain, unspecified: Secondary | ICD-10-CM | POA: Diagnosis not present

## 2016-12-19 DIAGNOSIS — Z419 Encounter for procedure for purposes other than remedying health state, unspecified: Secondary | ICD-10-CM

## 2016-12-19 DIAGNOSIS — Z79899 Other long term (current) drug therapy: Secondary | ICD-10-CM | POA: Diagnosis not present

## 2016-12-19 DIAGNOSIS — Z886 Allergy status to analgesic agent status: Secondary | ICD-10-CM | POA: Diagnosis not present

## 2016-12-19 DIAGNOSIS — Z885 Allergy status to narcotic agent status: Secondary | ICD-10-CM | POA: Diagnosis not present

## 2016-12-19 DIAGNOSIS — Z888 Allergy status to other drugs, medicaments and biological substances status: Secondary | ICD-10-CM

## 2016-12-19 DIAGNOSIS — I69351 Hemiplegia and hemiparesis following cerebral infarction affecting right dominant side: Secondary | ICD-10-CM

## 2016-12-19 DIAGNOSIS — Z882 Allergy status to sulfonamides status: Secondary | ICD-10-CM

## 2016-12-19 DIAGNOSIS — Z7951 Long term (current) use of inhaled steroids: Secondary | ICD-10-CM

## 2016-12-19 DIAGNOSIS — J449 Chronic obstructive pulmonary disease, unspecified: Secondary | ICD-10-CM | POA: Diagnosis not present

## 2016-12-19 DIAGNOSIS — R1011 Right upper quadrant pain: Secondary | ICD-10-CM | POA: Diagnosis not present

## 2016-12-19 DIAGNOSIS — K8010 Calculus of gallbladder with chronic cholecystitis without obstruction: Secondary | ICD-10-CM | POA: Diagnosis not present

## 2016-12-19 DIAGNOSIS — K8062 Calculus of gallbladder and bile duct with acute cholecystitis without obstruction: Secondary | ICD-10-CM | POA: Diagnosis not present

## 2016-12-19 DIAGNOSIS — K8020 Calculus of gallbladder without cholecystitis without obstruction: Secondary | ICD-10-CM | POA: Diagnosis not present

## 2016-12-19 DIAGNOSIS — Z7902 Long term (current) use of antithrombotics/antiplatelets: Secondary | ICD-10-CM

## 2016-12-19 DIAGNOSIS — E785 Hyperlipidemia, unspecified: Secondary | ICD-10-CM | POA: Diagnosis not present

## 2016-12-19 DIAGNOSIS — I10 Essential (primary) hypertension: Secondary | ICD-10-CM | POA: Diagnosis not present

## 2016-12-19 DIAGNOSIS — Z9989 Dependence on other enabling machines and devices: Secondary | ICD-10-CM | POA: Diagnosis not present

## 2016-12-19 DIAGNOSIS — F319 Bipolar disorder, unspecified: Secondary | ICD-10-CM | POA: Diagnosis present

## 2016-12-19 DIAGNOSIS — Z8673 Personal history of transient ischemic attack (TIA), and cerebral infarction without residual deficits: Secondary | ICD-10-CM | POA: Diagnosis not present

## 2016-12-19 DIAGNOSIS — E039 Hypothyroidism, unspecified: Secondary | ICD-10-CM | POA: Diagnosis not present

## 2016-12-19 DIAGNOSIS — Z8249 Family history of ischemic heart disease and other diseases of the circulatory system: Secondary | ICD-10-CM | POA: Diagnosis not present

## 2016-12-19 DIAGNOSIS — I252 Old myocardial infarction: Secondary | ICD-10-CM | POA: Diagnosis not present

## 2016-12-19 DIAGNOSIS — G43909 Migraine, unspecified, not intractable, without status migrainosus: Secondary | ICD-10-CM | POA: Diagnosis not present

## 2016-12-19 DIAGNOSIS — F17210 Nicotine dependence, cigarettes, uncomplicated: Secondary | ICD-10-CM | POA: Diagnosis present

## 2016-12-19 DIAGNOSIS — M797 Fibromyalgia: Secondary | ICD-10-CM | POA: Diagnosis present

## 2016-12-19 DIAGNOSIS — K859 Acute pancreatitis without necrosis or infection, unspecified: Secondary | ICD-10-CM | POA: Diagnosis not present

## 2016-12-19 DIAGNOSIS — F209 Schizophrenia, unspecified: Secondary | ICD-10-CM | POA: Diagnosis present

## 2016-12-19 DIAGNOSIS — K819 Cholecystitis, unspecified: Secondary | ICD-10-CM

## 2016-12-19 DIAGNOSIS — K831 Obstruction of bile duct: Secondary | ICD-10-CM | POA: Diagnosis not present

## 2016-12-19 DIAGNOSIS — R1030 Lower abdominal pain, unspecified: Secondary | ICD-10-CM | POA: Diagnosis not present

## 2016-12-19 DIAGNOSIS — Z90710 Acquired absence of both cervix and uterus: Secondary | ICD-10-CM

## 2016-12-19 DIAGNOSIS — Z87891 Personal history of nicotine dependence: Secondary | ICD-10-CM | POA: Diagnosis not present

## 2016-12-19 LAB — URINALYSIS, COMPLETE (UACMP) WITH MICROSCOPIC
BILIRUBIN URINE: NEGATIVE
Bacteria, UA: NONE SEEN
Glucose, UA: NEGATIVE mg/dL
Ketones, ur: 20 mg/dL — AB
LEUKOCYTES UA: NEGATIVE
Nitrite: NEGATIVE
PH: 5 (ref 5.0–8.0)
Protein, ur: NEGATIVE mg/dL
Specific Gravity, Urine: 1.018 (ref 1.005–1.030)

## 2016-12-19 LAB — TSH: TSH: 2.921 u[IU]/mL (ref 0.350–4.500)

## 2016-12-19 MED ORDER — ACETAMINOPHEN 325 MG PO TABS
650.0000 mg | ORAL_TABLET | Freq: Four times a day (QID) | ORAL | Status: DC | PRN
  Administered 2016-12-20: 650 mg via ORAL
  Filled 2016-12-19: qty 2

## 2016-12-19 MED ORDER — FENTANYL CITRATE (PF) 100 MCG/2ML IJ SOLN: 50.0000 ug | Freq: Once | INTRAMUSCULAR | Status: DC

## 2016-12-19 MED ORDER — MOMETASONE FURO-FORMOTEROL FUM 200-5 MCG/ACT IN AERO
2.0000 | INHALATION_SPRAY | Freq: Two times a day (BID) | RESPIRATORY_TRACT | Status: DC
  Administered 2016-12-19 – 2016-12-23 (×7): 2 via RESPIRATORY_TRACT
  Filled 2016-12-19 (×3): qty 8.8

## 2016-12-19 MED ORDER — FENTANYL CITRATE (PF) 100 MCG/2ML IJ SOLN
50.0000 ug | Freq: Once | INTRAMUSCULAR | Status: AC
  Administered 2016-12-19: 50 ug via INTRAVENOUS
  Filled 2016-12-19: qty 2

## 2016-12-19 MED ORDER — SODIUM CHLORIDE 0.9 % IV BOLUS (SEPSIS)
500.0000 mL | Freq: Once | INTRAVENOUS | Status: AC
  Administered 2016-12-19: 500 mL via INTRAVENOUS

## 2016-12-19 MED ORDER — HYDROMORPHONE HCL 1 MG/ML IJ SOLN
1.0000 mg | INTRAMUSCULAR | Status: DC | PRN
  Administered 2016-12-19 – 2016-12-21 (×12): 1 mg via INTRAVENOUS
  Filled 2016-12-19 (×12): qty 1

## 2016-12-19 MED ORDER — FENTANYL CITRATE (PF) 100 MCG/2ML IJ SOLN
INTRAMUSCULAR | Status: AC
  Filled 2016-12-19: qty 2

## 2016-12-19 MED ORDER — ONDANSETRON HCL 4 MG/2ML IJ SOLN
4.0000 mg | Freq: Four times a day (QID) | INTRAMUSCULAR | Status: DC | PRN
  Administered 2016-12-19 – 2016-12-20 (×4): 4 mg via INTRAVENOUS
  Filled 2016-12-19 (×4): qty 2

## 2016-12-19 MED ORDER — ACETAMINOPHEN 650 MG RE SUPP: 650.0000 mg | Freq: Four times a day (QID) | RECTAL | Status: DC | PRN

## 2016-12-19 MED ORDER — FENTANYL CITRATE (PF) 100 MCG/2ML IJ SOLN
50.0000 ug | Freq: Once | INTRAMUSCULAR | Status: AC
Start: 2016-12-19 — End: 2016-12-19
  Administered 2016-12-19: 50 ug via INTRAVENOUS

## 2016-12-19 MED ORDER — DEXTROSE 5 % IV SOLN
1.0000 g | INTRAVENOUS | Status: DC
  Administered 2016-12-19: 1 g via INTRAVENOUS
  Filled 2016-12-19 (×2): qty 10

## 2016-12-19 MED ORDER — ONDANSETRON HCL 4 MG PO TABS: 4.0000 mg | ORAL_TABLET | Freq: Four times a day (QID) | ORAL | Status: DC | PRN

## 2016-12-19 MED ORDER — ALBUTEROL SULFATE HFA 108 (90 BASE) MCG/ACT IN AERS: 2.0000 | INHALATION_SPRAY | Freq: Four times a day (QID) | RESPIRATORY_TRACT | Status: DC | PRN

## 2016-12-19 MED ORDER — POTASSIUM CHLORIDE IN NACL 20-0.9 MEQ/L-% IV SOLN
INTRAVENOUS | Status: AC
  Administered 2016-12-19 – 2016-12-20 (×3): via INTRAVENOUS
  Filled 2016-12-19 (×4): qty 1000

## 2016-12-19 MED ORDER — CIPROFLOXACIN IN D5W 400 MG/200ML IV SOLN
400.0000 mg | Freq: Two times a day (BID) | INTRAVENOUS | Status: DC
  Administered 2016-12-20: 400 mg via INTRAVENOUS
  Filled 2016-12-19: qty 200

## 2016-12-19 MED ORDER — ALBUTEROL SULFATE (2.5 MG/3ML) 0.083% IN NEBU: 2.5000 mg | INHALATION_SOLUTION | Freq: Four times a day (QID) | RESPIRATORY_TRACT | Status: DC | PRN

## 2016-12-19 MED ORDER — CEFTRIAXONE SODIUM IN DEXTROSE 20 MG/ML IV SOLN
1.0000 g | Freq: Once | INTRAVENOUS | Status: AC
  Administered 2016-12-19: 1 g via INTRAVENOUS
  Filled 2016-12-19: qty 50

## 2016-12-19 MED ORDER — FENTANYL CITRATE (PF) 100 MCG/2ML IJ SOLN: 50.0000 ug | INTRAMUSCULAR | Status: DC | PRN

## 2016-12-19 NOTE — Consult Note (Signed)
[facility] Consult Note  Referring Provider: No ref. provider found Primary Care Physician:  Christie Nottingham, PA Primary Gastroenterologist:  Dr.  Laurel Dimmer Complaint: Epigastric abdominal pain HPI: Carol [name] is an 61 y.o. white female  transferred from [place] after presenting with the abrupt onset of epigastric pain nausea and found to have elevated LFTs, choledocholithiasis on ultrasound and possible cholecystitis addition to cholelithiasis. Her AST was 686 ALT 759 bilirubin 1.3 alk phosphatase 477 with normal white blood cell count. Ultrasound findings as noted below. She is on Plavix but last took it on May 3. She had a similar milder episode about a week ago for which she did not seek medical attention.  Past Medical History:  Diagnosis Date  . Bipolar affective disorder ([place])   . Coronary artery disease   . Hyperlipidemia   . Hypertension   . Hypothyroid   . Myocardial infarction ([place])   . Stroke [facility])     Past Surgical History:  Procedure Laterality Date  . ABDOMINAL HYSTERECTOMY    . APPENDECTOMY    . CARDIAC CATHETERIZATION Left 09/26/2015   Procedure: Left Heart Cath and Coronary Angiography;  Surgeon: Teodoro Spray, MD;  Location: [facility];  Service: Cardiovascular;  Laterality: Left;  . HYSTEROTOMY      Medications Prior to Admission  Medication Sig Dispense Refill  . albuterol (PROVENTIL HFA;VENTOLIN HFA) 108 (90 BASE) MCG/ACT inhaler Inhale 2 puffs into the lungs every 6 (six) hours as needed for wheezing or shortness of breath.    Marland Kitchen albuterol (PROVENTIL) (2.5 MG/3ML) 0.083% nebulizer solution Take 2.5 mg by nebulization every 6 (six) hours as needed for wheezing or shortness of breath.    . budesonide-formoterol (SYMBICORT) 160-4.5 MCG/ACT inhaler Inhale 2 puffs into the lungs 2 (two) times daily.     . cetirizine (ZYRTEC) 10 MG tablet Take 10 mg by mouth daily as needed.     . citalopram (CELEXA) 40 MG tablet Take 40 mg by mouth daily.     . clopidogrel (PLAVIX) 75 MG tablet Take 75 mg by mouth daily.     . ferrous sulfate 325 (65 FE) MG tablet Take 325 mg by mouth every 7 (seven) days. Pt takes on Saturday.    Marland Kitchen HYDROcodone-acetaminophen (NORCO) 5-325 MG tablet Take 1 tablet by mouth every 6 (six) hours as needed for severe pain. 12 tablet 0  . isosorbide mononitrate (IMDUR) 30 MG 24 hr tablet Take 30 mg by mouth daily.    Marland Kitchen levothyroxine (SYNTHROID, LEVOTHROID) 100 MCG tablet Take 100 mcg by mouth daily.    Marland Kitchen lidocaine (LIDODERM) 5 % Place 1 patch onto the skin every 12 (twelve) hours. Remove & Discard patch within 12 hours or as directed by MD 10 patch 0  . lisinopril (PRINIVIL,ZESTRIL) 5 MG tablet Take 5 mg by mouth daily.    Marland Kitchen lubiprostone (AMITIZA) 8 MCG capsule Take 8 mcg by mouth 2 (two) times daily as needed for constipation.    . metoprolol tartrate (LOPRESSOR) 25 MG tablet Take 25 mg by mouth 2 (two) times daily.    . simvastatin (ZOCOR) 20 MG tablet Take 20 mg by mouth at bedtime.    . Vitamin D, Ergocalciferol, (DRISDOL) 50000 units CAPS capsule Take 50,000 Units by mouth every 7 (seven) days.    . [DISCONTINUED] valACYclovir (VALTREX) 1000 MG tablet Take 1 tablet (1,000 mg total) by mouth 3 (three) times daily. 30 tablet 0    Allergies:  Allergies  Allergen Reactions  .  Morphine Other (See Comments)    Reaction:  Hallucinations   . Pregabalin Other (See Comments)    Pt states that she was told not to take this medication.    . Rofecoxib Other (See Comments)    Reaction:  Burning of stomach   . Sulfamethoxazole-Trimethoprim Swelling    Reaction:  Unknown   . Asa [Aspirin] Hives, Nausea Only and Other (See Comments)    Reaction:  Burning of stomach     Family History  Problem Relation Age of Onset  . CAD      Social History:  reports that she has quit smoking. She has a 40.00 pack-year smoking history. She has never used smokeless tobacco. She reports that she does not drink alcohol or use  drugs.  Review of Systems: negative except As above   There were no vitals taken for this visit. Head: Normocephalic, without obvious abnormality, atraumatic Neck: no adenopathy, no carotid bruit, no JVD, supple, symmetrical, trachea midline and thyroid not enlarged, symmetric, no tenderness/mass/nodules Resp: clear to auscultation bilaterally Cardio: regular rate and rhythm, S1, S2 normal, no murmur, click, rub or gallop GI: Abdomen soft moderately tender in the epigastrium no hepatomegaly masses or guarding Extremities: extremities normal, atraumatic, no cyanosis or edema  Results for orders placed or performed during the hospital encounter of 12/19/16 (from the past 48 hour(s))  Lipase, blood     Status: None   Collection Time: 12/18/16  8:55 PM  Result Value Ref Range   Lipase 45 11 - 51 U/L  Comprehensive metabolic panel     Status: Abnormal   Collection Time: 12/18/16  8:55 PM  Result Value Ref Range   Sodium 139 135 - 145 mmol/L   Potassium 3.5 3.5 - 5.1 mmol/L   Chloride 107 101 - 111 mmol/L   CO2 24 22 - 32 mmol/L   Glucose, Bld 98 65 - 99 mg/dL   BUN 9 6 - 20 mg/dL   Creatinine, Ser 0.57 0.44 - 1.00 mg/dL   Calcium 9.4 8.9 - 10.3 mg/dL   Total Protein 7.4 6.5 - 8.1 g/dL   Albumin 4.2 3.5 - 5.0 g/dL   AST 686 (H) 15 - 41 U/L   ALT 759 (H) 14 - 54 U/L   Alkaline Phosphatase 477 (H) 38 - 126 U/L   Total Bilirubin 1.3 (H) 0.3 - 1.2 mg/dL   GFR calc non Af Amer >60 >60 mL/min   GFR calc Af Amer >60 >60 mL/min    Comment: (NOTE) The eGFR has been calculated using the CKD EPI equation. This calculation has not been validated in all clinical situations. eGFR's persistently <60 mL/min signify possible Chronic Kidney Disease.    Anion gap 8 5 - 15  CBC     Status: None   Collection Time: 12/18/16  8:55 PM  Result Value Ref Range   WBC 5.7 3.6 - 11.0 K/uL   RBC 4.00 3.80 - 5.20 MIL/uL   Hemoglobin 12.5 12.0 - 16.0 g/dL   HCT 37.5 35.0 - 47.0 %   MCV 93.7 80.0 - 100.0  fL   MCH 31.3 26.0 - 34.0 pg   MCHC 33.4 32.0 - 36.0 g/dL   RDW 13.0 11.5 - 14.5 %   Platelets 207 150 - 440 K/uL  Troponin I     Status: None   Collection Time: 12/18/16  8:55 PM  Result Value Ref Range   Troponin I <0.03 <0.03 ng/mL  Urinalysis, Complete w Microscopic  Status: Abnormal   Collection Time: 12/19/16  2:00 AM  Result Value Ref Range   Color, Urine AMBER (A) YELLOW    Comment: BIOCHEMICALS MAY BE AFFECTED BY COLOR   APPearance CLEAR (A) CLEAR   Specific Gravity, Urine 1.018 1.005 - 1.030   pH 5.0 5.0 - 8.0   Glucose, UA NEGATIVE NEGATIVE mg/dL   Hgb urine dipstick SMALL (A) NEGATIVE   Bilirubin Urine NEGATIVE NEGATIVE   Ketones, ur 20 (A) NEGATIVE mg/dL   Protein, ur NEGATIVE NEGATIVE mg/dL   Nitrite NEGATIVE NEGATIVE   Leukocytes, UA NEGATIVE NEGATIVE   RBC / HPF 0-5 0 - 5 RBC/hpf   WBC, UA 0-5 0 - 5 WBC/hpf   Bacteria, UA NONE SEEN NONE SEEN   Squamous Epithelial / LPF 0-5 (A) NONE SEEN   Mucous PRESENT    US Abdomen Limited Ruq  Result Date: 12/19/2016 CLINICAL DATA:  Right upper quadrant pain and vomiting for 2 days EXAM: US ABDOMEN LIMITED - RIGHT UPPER QUADRANT COMPARISON:  None. FINDINGS: Gallbladder: Calculi and sludge within the gallbladder lumen. The patient was tender to probe pressure over the gallbladder. There is gallbladder mural thickening to 6 mm. There is a trace pericholecystic fluid. The calculi measure up to 5 mm. Common bile duct: Diameter: 6.0 mm.  There is a 5 mm stone visible in the distal CBD Liver: No focal lesion identified. Within normal limits in parenchymal echogenicity. IMPRESSION: 1. Cholelithiasis and choledocholithiasis. 2. Gallbladder mural thickening, pericholecystic fluid and positive ultrasound Murphy sign, suspicious for acute cholecystitis. Electronically Signed   By: Andreas Newport M.D.   On: 12/19/2016 01:43    Assessment: 1. Cholelithiasis, choledocholithiasis and possible cholecystitis 2. Anticoagulation on  Plavix Plan:  1. ERCP scheduled for tomorrow. Risks rationale and alternatives were explained to the patient she wishes to proceed 2. If reversal agent for Plavix exist I would administer, probably okay for ERCP tomorrow in any case patient knows of possible slight increased risk from sphincterotomy 3. Empiric antibiotics 4. Surgical consult 5. Clear liquid diet today. Nykiah Ma C 12/19/2016, 7:47 AM  Pager [phone] If no answer or after 5 PM call [phone]

## 2016-12-19 NOTE — ED Notes (Signed)
Patient transported to Ultrasound 

## 2016-12-19 NOTE — ED Notes (Signed)
Pt shakes head "yes" when asked if pain medication helped. Pt's son and granddaughter to [place] with pt.

## 2016-12-19 NOTE — H&P (Signed)
History and Physical    Carol [name] OBS:[id number] DOB: 1956/06/01 DOA: 12/19/2016  PCP: Christie Nottingham, PA  Patient coming from: [facility].  I have personally briefly reviewed patient's old medical records in [place]  Chief Complaint: Severe abdominal pain after eating dinner last p.m.  HPI: Carol [name] is a 61 y.o. female with medical history significant of coronary artery disease with myocardial infarction and left heart catheterization 7 years ago, stroke in April 2018, hypertension, bipolar disorder, hyperlipidemia, hypothyroidism who presents emergency department at [facility] with complaints of abdominal pain. Patient's symptoms began a few evenings ago when she developed short sharp right upper quadrant pain which woke her up after sleep at approximately 3 AM. Her husband checked her blood pressure she said it didn't feel like a heart pain and eventually the pain dissipated but she was sore for the rest of the day. On Saturday evening after dinner she developed severe right upper quadrant pain which radiated to her back her dinner was a bologna sandwich. She had associated nausea and vomiting but no fever her blood pressure was elevated at that time. Other symptoms include green stools but not diarrhea. Her pain was rated as a 5-6 out of 10. She therefore came into the emergency department for further evaluation.   ED Course: In the emergency department she was found to be tender in the right upper quadrant abdominal ultrasound revealed cholelithiasis with choledocholithiasis with a 5 mm stone in the common bile duct which was enlarged to 6 mm. Her AST was 686, ALT 759, alkaline phosphatase 477, total bili 1.3. Lipase was normal at 45. White blood cell count was normal at 5.7 urinalysis was unremarkable. She's referred to [facility] for further evaluation and management.  Review of Systems: As per HPI otherwise 10 point review of  systems negative.   Review of Systems  Constitutional: Positive for malaise/fatigue. Negative for chills and fever.  HENT: Negative for ear pain, nosebleeds and sinus pain.   Eyes: Negative for blurred vision and double vision.  Respiratory: Negative for cough, hemoptysis, sputum production and shortness of breath.   Cardiovascular: Negative for chest pain, palpitations, orthopnea and leg swelling.  Gastrointestinal: Positive for abdominal pain, nausea and vomiting. Negative for diarrhea and heartburn.       Loose green stools  Genitourinary: Negative for dysuria, frequency and urgency.  Musculoskeletal: Negative for back pain, myalgias and neck pain.  Skin: Negative for itching and rash.  Neurological: Positive for weakness. Negative for dizziness, tingling and headaches.  Endo/Heme/Allergies: Negative for environmental allergies. Does not bruise/bleed easily.  Psychiatric/Behavioral: Negative for depression and suicidal ideas.   Past Medical History:  Diagnosis Date  . Bipolar affective disorder ([place])   . Coronary artery disease   . Hyperlipidemia   . Hypertension   . Hypothyroid   . Myocardial infarction ([place])   . Stroke [facility])     Past Surgical History:  Procedure Laterality Date  . ABDOMINAL HYSTERECTOMY    . APPENDECTOMY    . CARDIAC CATHETERIZATION Left 09/26/2015   Procedure: Left Heart Cath and Coronary Angiography;  Surgeon: Teodoro Spray, MD;  Location: [place] [facility];  Service: Cardiovascular;  Laterality: Left;  . HYSTEROTOMY       reports that she has quit smoking. She has a 40.00 pack-year smoking history. She has never used smokeless tobacco. She reports that she does not drink alcohol or use drugs.   While medical record  reveals the patient has quit smoking she says she is down to 5 cigarettes a day. She does not require a nicotine patch.  Allergies  Allergen Reactions  . Morphine Other (See Comments)    Reaction:  Hallucinations   . Pregabalin Other  (See Comments)    Pt states that she was told not to take this medication.    . Rofecoxib Other (See Comments)    Reaction:  Burning of stomach   . Sulfamethoxazole-Trimethoprim Swelling    Reaction:  Unknown   . Asa [Aspirin] Hives, Nausea Only and Other (See Comments)    Reaction:  Burning of stomach     Family History  Problem Relation Age of Onset  . CAD       Prior to Admission medications   Medication Sig Start Date End Date Taking? Authorizing Provider  albuterol (PROVENTIL HFA;VENTOLIN HFA) 108 (90 BASE) MCG/ACT inhaler Inhale 2 puffs into the lungs every 6 (six) hours as needed for wheezing or shortness of breath.    [provider]  albuterol (PROVENTIL) (2.5 MG/3ML) 0.083% nebulizer solution Take 2.5 mg by nebulization every 6 (six) hours as needed for wheezing or shortness of breath.    [provider]  budesonide-formoterol (SYMBICORT) 160-4.5 MCG/ACT inhaler Inhale 2 puffs into the lungs 2 (two) times daily.     [provider]  cetirizine (ZYRTEC) 10 MG tablet Take 10 mg by mouth daily as needed.     [provider]  citalopram (CELEXA) 40 MG tablet Take 40 mg by mouth daily.    [provider]  clopidogrel (PLAVIX) 75 MG tablet Take 75 mg by mouth daily.     [provider]  ferrous sulfate 325 (65 FE) MG tablet Take 325 mg by mouth every 7 (seven) days. Pt takes on Saturday.    [provider]  HYDROcodone-acetaminophen (NORCO) 5-325 MG tablet Take 1 tablet by mouth every 6 (six) hours as needed for severe pain. 08/29/16   Hagler, Jami L, PA-C  isosorbide mononitrate (IMDUR) 30 MG 24 hr tablet Take 30 mg by mouth daily.    [provider]  levothyroxine (SYNTHROID, LEVOTHROID) 100 MCG tablet Take 100 mcg by mouth daily.    [provider]  lidocaine (LIDODERM) 5 % Place 1 patch onto the skin every 12 (twelve) hours. Remove & Discard patch within 12 hours or as directed by MD 08/29/16 08/29/17   Hagler, Jami L, PA-C  lisinopril (PRINIVIL,ZESTRIL) 5 MG tablet Take 5 mg by mouth daily.    [provider]  lubiprostone (AMITIZA) 8 MCG capsule Take 8 mcg by mouth 2 (two) times daily as needed for constipation.    [provider]  metoprolol tartrate (LOPRESSOR) 25 MG tablet Take 25 mg by mouth 2 (two) times daily.    [provider]  simvastatin (ZOCOR) 20 MG tablet Take 20 mg by mouth at bedtime.    [provider]  Vitamin D, Ergocalciferol, (DRISDOL) 50000 units CAPS capsule Take 50,000 Units by mouth every 7 (seven) days.    [provider]    Physical Exam: Vital signs on presentation to [facility] temperature 97.8, pulse 51, respirations 12, blood pressure 142/63, O2 sat 98% on room air with weight 140 pounds  Constitutional: NAD, calm, comfortable Eyes: PERRL, lids and conjunctivae normal ENMT: Mucous membranes are moist. Posterior pharynx clear of any exudate or lesions.Normal dentition.  Neck: normal, supple, no masses, no thyromegaly Respiratory: clear to auscultation bilaterally, no wheezing, no  crackles. Normal respiratory effort. No accessory muscle use.  Cardiovascular: Regular rate and rhythm, no murmurs / rubs / gallops. No extremity edema. 2+ pedal pulses. No carotid bruits.  Abdomen: Right upper quadrant tenderness with positive Murphy sign, no masses palpated. No hepatosplenomegaly. Bowel sounds hypoactive.  Musculoskeletal: no clubbing / cyanosis. No joint deformity upper and lower extremities. Good ROM, no contractures. Normal muscle tone.  Skin: no rashes, lesions, ulcers. No induration Neurologic: CN 2-12 grossly intact. Sensation intact, DTR normal. Strength 5/5 in all 4.  Psychiatric: Normal judgment and insight. Alert and oriented x 3. Normal mood.     Labs on Admission: I have personally reviewed following labs and imaging studies  CBC:  Recent Labs Lab 12/18/16 2055  WBC 5.7  HGB 12.5  HCT 37.5    MCV 93.7  PLT 540   Basic Metabolic Panel:  Recent Labs Lab 12/18/16 2055  NA 139  K 3.5  CL 107  CO2 24  GLUCOSE 98  BUN 9  CREATININE 0.57  CALCIUM 9.4   GFR: Estimated Creatinine Clearance: 60.7 mL/min (by C-G formula based on SCr of 0.57 mg/dL). Liver Function Tests:  Recent Labs Lab 12/18/16 2055  AST 686*  ALT 759*  ALKPHOS 477*  BILITOT 1.3*  PROT 7.4  ALBUMIN 4.2    Recent Labs Lab 12/18/16 2055  LIPASE 45   No results for input(s): AMMONIA in the last 168 hours. Coagulation Profile: No results for input(s): INR, PROTIME in the last 168 hours. Cardiac Enzymes:  Recent Labs Lab 12/18/16 2055  TROPONINI <0.03   BNP (last 3 results) No results for input(s): PROBNP in the last 8760 hours. HbA1C: No results for input(s): HGBA1C in the last 72 hours. CBG: No results for input(s): GLUCAP in the last 168 hours. Lipid Profile: No results for input(s): CHOL, HDL, LDLCALC, TRIG, CHOLHDL, LDLDIRECT in the last 72 hours. Thyroid Function Tests: No results for input(s): TSH, T4TOTAL, FREET4, T3FREE, THYROIDAB in the last 72 hours. Anemia Panel: No results for input(s): VITAMINB12, FOLATE, FERRITIN, TIBC, IRON, RETICCTPCT in the last 72 hours. Urine analysis:    Component Value Date/Time   COLORURINE AMBER (A) 12/19/2016 0200   APPEARANCEUR CLEAR (A) 12/19/2016 0200   APPEARANCEUR Clear 06/21/2013 0939   LABSPEC 1.018 12/19/2016 0200   LABSPEC 1.005 06/21/2013 0939   PHURINE 5.0 12/19/2016 0200   GLUCOSEU NEGATIVE 12/19/2016 0200   GLUCOSEU Negative 06/21/2013 0939   HGBUR SMALL (A) 12/19/2016 0200   BILIRUBINUR NEGATIVE 12/19/2016 0200   BILIRUBINUR Negative 06/21/2013 0939   KETONESUR 20 (A) 12/19/2016 0200   PROTEINUR NEGATIVE 12/19/2016 0200   NITRITE NEGATIVE 12/19/2016 0200   LEUKOCYTESUR NEGATIVE 12/19/2016 0200   LEUKOCYTESUR Negative 06/21/2013 0939    Radiological Exams on Admission: US Abdomen Limited Ruq  Result Date:  12/19/2016 CLINICAL DATA:  Right upper quadrant pain and vomiting for 2 days EXAM: US ABDOMEN LIMITED - RIGHT UPPER QUADRANT COMPARISON:  None. FINDINGS: Gallbladder: Calculi and sludge within the gallbladder lumen. The patient was tender to probe pressure over the gallbladder. There is gallbladder mural thickening to 6 mm. There is a trace pericholecystic fluid. The calculi measure up to 5 mm. Common bile duct: Diameter: 6.0 mm.  There is a 5 mm stone visible in the distal CBD Liver: No focal lesion identified. Within normal limits in parenchymal echogenicity. IMPRESSION: 1. Cholelithiasis and choledocholithiasis. 2. Gallbladder mural thickening, pericholecystic fluid and positive ultrasound Murphy sign, suspicious for acute cholecystitis. Electronically Signed   By: Quillian Quince  Armandina Stammer M.D.   On: 12/19/2016 01:43    EKG: Independently reviewed. Normal sinus rhythm with normal axes and intervals unremarkable.  Assessment/Plan Principal Problem:   Choledocholithiasis with acute cholecystitis Active Problems:   Coronary artery disease   Essential hypertension   Hypothyroidism   History of stroke   Bipolar disorder ([place])  1. Choledocholithiasis with acute cholecystitis: Patient will be admitted into the hospital I have already discussed her case with Dr. Amedeo Plenty from GI who plans to see her today. She will likely require an ERCP followed by a cholecystectomy. She has been started on ceftriaxone she received a dose at [facility]. Will discuss antibiotic choices with Dr. Amedeo Plenty once he has seen the patient. For now we'll continue ceftriaxone. She is made nothing by mouth and we will hold her home medications until such time it is safe for her to eat.  2. Coronary artery disease: Patient with history of myocardial infarction cardiac catheterization revealed no need for stent she had good collateral blood flow. This was approximately 7 years ago. She does not have any angina at this point.  We will continue home medications once it safe for her to eat.  3. Essential hypertension: monitor blood pressure closely. Will give IV blood pressure medication if necessary. For now plan is to monitor blood pressure closely.  4. Hypothyroidism: Synthroid is on hold.  5. History of stroke: This is noted she had a stroke on 11/15/2016. Plavix is currently on hold. Given that she will likely need surgery will start her on SCDs and not use heparin or Lovenox at the present time.  6 bipolar disorder: Hold home medications monitor closely. Patient appears very well compensated at this point.   DVT prophylaxis: SCDs Code Status: Full Family Communication: Patient alert awake and oriented. No family present at the time of my evaluation. Disposition Plan: Hopefully home in 3-5 days. Consults called: Dr. Amedeo Plenty from gastroenterology Admission status: Inpatient   Lady Deutscher MD [place] Pager [phone]  If 7PM-7AM, please contact night-coverage [email or web address] Password [facility]  12/19/2016, 8:43 AM

## 2016-12-19 NOTE — ED Provider Notes (Signed)
[facility] Emergency Department Provider Note   ____________________________________________   First MD Initiated Contact with Patient 12/19/16 0113     (approximate)  I have reviewed the triage vital signs and the nursing notes.   HISTORY  Chief Complaint Abdominal Pain    HPI Carol [name] is a 61 y.o. female who comes into the hospital today with some abdominal pain. She reports that a few days ago she had some sharp pain in her right upper quadrant and heartburn. She had her son checked her blood pressure but the pain eased off. The patient reports that today she hasn't felt well all day. After eating dinner tonight she reports that severe pain returned to her right upper quadrant and it does radiate to her back. The patient reports that she didn't take anything at home for pain but she could not get comfortable. She received some pain medicine when she arrived to this hospital and the pain improved. The patient has had some nausea and vomiting with no fevers. Her blood pressure has been elevated. The patient denies any diarrhea but has been having some green stools. She rates her pain a 5-6 out of 10 in intensity. The patient is here today for evaluation of the symptoms.   Past Medical History:  Diagnosis Date  . Bipolar affective disorder ([place])   . Coronary artery disease   . Hyperlipidemia   . Hypertension   . Hypothyroid   . Myocardial infarction ([place])   . Stroke [facility])     Patient Active Problem List   Diagnosis Date Noted  . Choledocholithiasis with acute cholecystitis 12/19/2016  . Migraine 06/17/2016  . Chest pain 05/12/2015  . Right facial numbness 03/30/2015  . Lithium intoxication 02/27/2015  . Bipolar disorder ([place]) 02/27/2015  . CVA (cerebral infarction) 02/26/2015  . Unstable angina ([place]) 02/03/2015    Past Surgical History:  Procedure Laterality Date  . ABDOMINAL HYSTERECTOMY    . APPENDECTOMY    . CARDIAC CATHETERIZATION  Left 09/26/2015   Procedure: Left Heart Cath and Coronary Angiography;  Surgeon: Teodoro Spray, MD;  Location: [place] [facility];  Service: Cardiovascular;  Laterality: Left;  . HYSTEROTOMY      Prior to Admission medications   Medication Sig Start Date End Date Taking? Authorizing Provider  albuterol (PROVENTIL HFA;VENTOLIN HFA) 108 (90 BASE) MCG/ACT inhaler Inhale 2 puffs into the lungs every 6 (six) hours as needed for wheezing or shortness of breath.    [provider]  albuterol (PROVENTIL) (2.5 MG/3ML) 0.083% nebulizer solution Take 2.5 mg by nebulization every 6 (six) hours as needed for wheezing or shortness of breath.    [provider]  budesonide-formoterol (SYMBICORT) 160-4.5 MCG/ACT inhaler Inhale 2 puffs into the lungs 2 (two) times daily.     [provider]  cetirizine (ZYRTEC) 10 MG tablet Take 10 mg by mouth daily as needed.     [provider]  citalopram (CELEXA) 40 MG tablet Take 40 mg by mouth daily.    [provider]  clopidogrel (PLAVIX) 75 MG tablet Take 75 mg by mouth daily.     [provider]  ferrous sulfate 325 (65 FE) MG tablet Take 325 mg by mouth every 7 (seven) days. Pt takes on Saturday.    [provider]  HYDROcodone-acetaminophen (NORCO) 5-325 MG tablet Take 1 tablet by mouth every 6 (six) hours as needed for severe pain. 08/29/16   Hagler, Jami L, PA-C  isosorbide mononitrate (IMDUR)  30 MG 24 hr tablet Take 30 mg by mouth daily.    [provider]  levothyroxine (SYNTHROID, LEVOTHROID) 100 MCG tablet Take 100 mcg by mouth daily.    [provider]  lidocaine (LIDODERM) 5 % Place 1 patch onto the skin every 12 (twelve) hours. Remove & Discard patch within 12 hours or as directed by MD 08/29/16 08/29/17  Hagler, Jami L, PA-C  lisinopril (PRINIVIL,ZESTRIL) 5 MG tablet Take 5 mg by mouth daily.    [provider]  lubiprostone (AMITIZA) 8 MCG capsule Take 8 mcg by mouth 2  (two) times daily as needed for constipation.    [provider]  metoprolol tartrate (LOPRESSOR) 25 MG tablet Take 25 mg by mouth 2 (two) times daily.    [provider]  simvastatin (ZOCOR) 20 MG tablet Take 20 mg by mouth at bedtime.    [provider]  valACYclovir (VALTREX) 1000 MG tablet Take 1 tablet (1,000 mg total) by mouth 3 (three) times daily. 08/29/16   Hagler, Jami L, PA-C  Vitamin D, Ergocalciferol, (DRISDOL) 50000 units CAPS capsule Take 50,000 Units by mouth every 7 (seven) days.    [provider]    Allergies Morphine; Pregabalin; Rofecoxib; Sulfamethoxazole-trimethoprim; and Asa [aspirin]  Family History  Problem Relation Age of Onset  . CAD      Social History Social History  Substance Use Topics  . Smoking status: Former Smoker    Packs/day: 1.00    Years: 40.00  . Smokeless tobacco: Never Used  . Alcohol use No    Review of Systems  Constitutional: No fever/chills Eyes: No visual changes. ENT: No sore throat. Cardiovascular: Denies chest pain. Respiratory: Denies shortness of breath. Gastrointestinal: abdominal pain.   nausea, vomiting.  No diarrhea.  No constipation. Genitourinary: Negative for dysuria. Musculoskeletal: Negative for back pain. Skin: Negative for rash. Neurological: Negative for headaches, focal weakness or numbness.   ____________________________________________   PHYSICAL EXAM:  VITAL SIGNS: ED Triage Vitals  Enc Vitals Group     BP 12/18/16 2049 (!) 142/119     Pulse Rate 12/18/16 2049 (!) 58     Resp 12/18/16 2049 (!) 22     Temp 12/18/16 2049 98.4 F (36.9 C)     Temp Source 12/18/16 2049 Oral     SpO2 12/18/16 2049 96 %     Weight 12/18/16 2051 140 lb (63.5 kg)     Height 12/18/16 2051 5' (1.524 m)     Head Circumference --      Peak Flow --      Pain Score 12/18/16 2056 10     Pain Loc --      Pain Edu? --      Excl. in [place]? --     Constitutional: Alert and oriented. Well  appearing and in Moderate distress. Eyes: Conjunctivae are normal. PERRL. EOMI. Head: Atraumatic. Nose: No congestion/rhinnorhea. Mouth/Throat: Mucous membranes are moist.  Oropharynx non-erythematous. Cardiovascular: Normal rate, regular rhythm. Grossly normal heart sounds.  Good peripheral circulation. Respiratory: Normal respiratory effort.  No retractions. Lungs CTAB. Gastrointestinal: Soft with right upper quadrant tenderness to palpation. No distention. Positive bowel sounds Musculoskeletal: No lower extremity tenderness nor edema.   Neurologic:  Normal speech and language.  Skin:  Skin is warm, dry and intact. Marland Kitchen Psychiatric: Mood and affect are normal.  ____________________________________________   LABS (all labs ordered are listed, but only abnormal results are displayed)  Labs Reviewed  COMPREHENSIVE METABOLIC PANEL - Abnormal; Notable for  the following:       Result Value   AST 686 (*)    ALT 759 (*)    Alkaline Phosphatase 477 (*)    Total Bilirubin 1.3 (*)    All other components within normal limits  URINALYSIS, COMPLETE (UACMP) WITH MICROSCOPIC - Abnormal; Notable for the following:    Color, Urine AMBER (*)    APPearance CLEAR (*)    Hgb urine dipstick SMALL (*)    Ketones, ur 20 (*)    Squamous Epithelial / LPF 0-5 (*)    All other components within normal limits  LIPASE, BLOOD  CBC  TROPONIN I   ____________________________________________  EKG  ED ECG REPORT I, Loney Hering, the attending physician, personally viewed and interpreted this ECG.   Date: 12/18/2016  EKG Time: 2055  Rate: 60  Rhythm: normal sinus rhythm  Axis: normal  Intervals:none  ST&T Change: none  ____________________________________________  RADIOLOGY  [place] abd RUQ ____________________________________________   PROCEDURES  Procedure(s) performed: None  Procedures  Critical Care performed: No  ____________________________________________   INITIAL  IMPRESSION / ASSESSMENT AND PLAN / ED COURSE  Pertinent labs & imaging results that were available during my care of the patient were reviewed by me and considered in my medical decision making (see chart for details).  This is a 61 year old female who comes into the hospital today with right upper quadrant. The patient's ultrasound is concerning for choledocholithiasis as well as cholecystitis. I did contact our gastroenterologist Dr. Tarri Glenn and she reports that she does not perform ERCPs. I did contact [facility] physician and they would be able to perform the procedure that the patient needs. Since the patient also has cholecystitis I will give the patient some ceftriaxone. She will also receive a 500 mL bolus of normal saline and some ceftriaxone. The patient will be transferred to [facility].  Clinical Course as of Dec 19 256  Sun Dec 19, 2016  0152 1. Cholelithiasis and choledocholithiasis. 2. Gallbladder mural thickening, pericholecystic fluid and positive ultrasound Murphy sign, suspicious for acute cholecystitis.   US Abdomen Limited RUQ [AW]    Clinical Course User Index [AW] Loney Hering, MD     ____________________________________________   FINAL CLINICAL IMPRESSION(S) / ED DIAGNOSES  Final diagnoses:  RUQ pain  Choledocholithiasis  Cholecystitis      NEW MEDICATIONS STARTED DURING THIS VISIT:  New Prescriptions   No medications on file     Note:  This document was prepared using Dragon voice recognition software and may include unintentional dictation errors.    Loney Hering, MD 12/19/16 [phone]

## 2016-12-20 ENCOUNTER — Inpatient Hospital Stay (HOSPITAL_COMMUNITY): Payer: Medicare Other | Admitting: Certified Registered Nurse Anesthetist

## 2016-12-20 ENCOUNTER — Encounter (HOSPITAL_COMMUNITY)
Admission: AD | Disposition: A | Discharge status: Home / Self Care | Payer: Self-pay | Source: Other Acute Inpatient Hospital | Attending: Internal Medicine

## 2016-12-20 ENCOUNTER — Encounter (HOSPITAL_COMMUNITY): Payer: Self-pay | Admitting: Anesthesiology

## 2016-12-20 ENCOUNTER — Inpatient Hospital Stay (HOSPITAL_COMMUNITY): Payer: Medicare Other

## 2016-12-20 DIAGNOSIS — I10 Essential (primary) hypertension: Secondary | ICD-10-CM

## 2016-12-20 DIAGNOSIS — Z8673 Personal history of transient ischemic attack (TIA), and cerebral infarction without residual deficits: Secondary | ICD-10-CM

## 2016-12-20 DIAGNOSIS — K8042 Calculus of bile duct with acute cholecystitis without obstruction: Secondary | ICD-10-CM

## 2016-12-20 HISTORY — PX: ERCP: SHX5425

## 2016-12-20 LAB — CBC
HEMATOCRIT: 35.6 % — AB (ref 36.0–46.0)
HEMOGLOBIN: 11.1 g/dL — AB (ref 12.0–15.0)
MCH: 30.3 pg (ref 26.0–34.0)
MCHC: 31.2 g/dL (ref 30.0–36.0)
MCV: 97.3 fL (ref 78.0–100.0)
Platelets: 170 10*3/uL (ref 150–400)
RBC: 3.66 MIL/uL — ABNORMAL LOW (ref 3.87–5.11)
RDW: 12.4 % (ref 11.5–15.5)
WBC: 5.9 10*3/uL (ref 4.0–10.5)

## 2016-12-20 LAB — COMPREHENSIVE METABOLIC PANEL
ALBUMIN: 3.2 g/dL — AB (ref 3.5–5.0)
ALT: 373 U/L — ABNORMAL HIGH (ref 14–54)
ANION GAP: 6 (ref 5–15)
AST: 152 U/L — ABNORMAL HIGH (ref 15–41)
Alkaline Phosphatase: 368 U/L — ABNORMAL HIGH (ref 38–126)
BUN: 7 mg/dL (ref 6–20)
CHLORIDE: 108 mmol/L (ref 101–111)
CO2: 23 mmol/L (ref 22–32)
Calcium: 8.8 mg/dL — ABNORMAL LOW (ref 8.9–10.3)
Creatinine, Ser: 0.72 mg/dL (ref 0.44–1.00)
GFR calc Af Amer: >60 mL/min (ref >60)
GFR calc non Af Amer: >60 mL/min (ref >60)
GLUCOSE: 105 mg/dL — AB (ref 65–99)
POTASSIUM: 3.5 mmol/L (ref 3.5–5.1)
Sodium: 137 mmol/L (ref 135–145)
TOTAL PROTEIN: 5.8 g/dL — AB (ref 6.5–8.1)
Total Bilirubin: 0.6 mg/dL (ref 0.3–1.2)

## 2016-12-20 LAB — SURGICAL PCR SCREEN
MRSA, PCR: NEGATIVE
STAPHYLOCOCCUS AUREUS: NEGATIVE

## 2016-12-20 LAB — HIV ANTIBODY (ROUTINE TESTING W REFLEX): HIV Screen 4th Generation wRfx: NONREACTIVE

## 2016-12-20 SURGERY — ERCP, WITH INTERVENTION IF INDICATED
Anesthesia: General

## 2016-12-20 MED ORDER — LACTATED RINGERS IV SOLN
INTRAVENOUS | Status: DC | PRN
  Administered 2016-12-20 (×2): via INTRAVENOUS

## 2016-12-20 MED ORDER — MIDAZOLAM HCL 5 MG/5ML IJ SOLN
INTRAMUSCULAR | Status: DC | PRN
  Administered 2016-12-20: 2 mg via INTRAVENOUS

## 2016-12-20 MED ORDER — DEXAMETHASONE SODIUM PHOSPHATE 10 MG/ML IJ SOLN
INTRAMUSCULAR | Status: DC | PRN
  Administered 2016-12-20: 10 mg via INTRAVENOUS

## 2016-12-20 MED ORDER — CLOTRIMAZOLE 2 % VA CREA
1.0000 | TOPICAL_CREAM | Freq: Every day | VAGINAL | Status: AC
  Administered 2016-12-20 – 2016-12-21 (×3): 1 via VAGINAL
  Filled 2016-12-20: qty 21

## 2016-12-20 MED ORDER — LIDOCAINE HCL (CARDIAC) 20 MG/ML IV SOLN
INTRAVENOUS | Status: DC | PRN
  Administered 2016-12-20: 60 mg via INTRAVENOUS

## 2016-12-20 MED ORDER — PROPOFOL 10 MG/ML IV BOLUS
INTRAVENOUS | Status: DC | PRN
  Administered 2016-12-20: 140 mg via INTRAVENOUS

## 2016-12-20 MED ORDER — PIPERACILLIN-TAZOBACTAM 3.375 G IVPB
3.3750 g | Freq: Three times a day (TID) | INTRAVENOUS | Status: DC
  Administered 2016-12-20 – 2016-12-23 (×9): 3.375 g via INTRAVENOUS
  Filled 2016-12-20 (×11): qty 50

## 2016-12-20 MED ORDER — INDOMETHACIN 50 MG RE SUPP
RECTAL | Status: AC
Start: 2016-12-20 — End: 2016-12-20
  Filled 2016-12-20: qty 2

## 2016-12-20 MED ORDER — GLUCAGON HCL RDNA (DIAGNOSTIC) 1 MG IJ SOLR
INTRAMUSCULAR | Status: AC
  Filled 2016-12-20: qty 1

## 2016-12-20 MED ORDER — ONDANSETRON HCL 4 MG/2ML IJ SOLN
INTRAMUSCULAR | Status: DC | PRN
  Administered 2016-12-20: 4 mg via INTRAVENOUS

## 2016-12-20 MED ORDER — FENTANYL CITRATE (PF) 100 MCG/2ML IJ SOLN
INTRAMUSCULAR | Status: DC | PRN
  Administered 2016-12-20: 50 ug via INTRAVENOUS

## 2016-12-20 MED ORDER — EPHEDRINE SULFATE 50 MG/ML IJ SOLN
INTRAMUSCULAR | Status: DC | PRN
  Administered 2016-12-20: 10 mg via INTRAVENOUS

## 2016-12-20 MED ORDER — SCOPOLAMINE 1 MG/3DAYS TD PT72
1.0000 | MEDICATED_PATCH | Freq: Once | TRANSDERMAL | Status: DC
  Administered 2016-12-20: 1.5 mg via TRANSDERMAL
  Filled 2016-12-20: qty 1

## 2016-12-20 MED ORDER — LEVOTHYROXINE SODIUM 100 MCG PO TABS
100.0000 ug | ORAL_TABLET | Freq: Every day | ORAL | Status: DC
  Administered 2016-12-23: 100 ug via ORAL
  Filled 2016-12-20: qty 1

## 2016-12-20 MED ORDER — KCL IN DEXTROSE-NACL 40-5-0.9 MEQ/L-%-% IV SOLN
INTRAVENOUS | Status: DC
  Administered 2016-12-20: 16:00:00 via INTRAVENOUS
  Filled 2016-12-20 (×2): qty 1000

## 2016-12-20 MED ORDER — METOPROLOL TARTRATE 12.5 MG HALF TABLET
12.5000 mg | ORAL_TABLET | Freq: Two times a day (BID) | ORAL | Status: DC
  Administered 2016-12-20 – 2016-12-23 (×6): 12.5 mg via ORAL
  Filled 2016-12-20 (×7): qty 1

## 2016-12-20 MED ORDER — SUCCINYLCHOLINE CHLORIDE 20 MG/ML IJ SOLN
INTRAMUSCULAR | Status: DC | PRN
  Administered 2016-12-20: 120 mg via INTRAVENOUS

## 2016-12-20 MED ORDER — CLOTRIMAZOLE 2 % VA CREA
1.0000 | TOPICAL_CREAM | Freq: Every day | VAGINAL | Status: DC
  Filled 2016-12-20: qty 21

## 2016-12-20 NOTE — Op Note (Signed)
[facility] Patient Name: Carol [name] Procedure Date : 12/20/2016 MRN: [id number] Attending MD: Missy Sabins , MD Date of Birth: [birth year] CSN: [id number] Age: 61 Admit Type: Inpatient Procedure:                ERCP Indications:              For therapy of bile duct stone(s) Providers:                Elyse Jarvis. Amedeo Plenty, MD, Zenon Mayo, RN, Corliss Parish, Technician, Cletis Athens, Technician Referring MD:              Medicines:                General Anesthesia Complications:            No immediate complications. Estimated Blood Loss:     Estimated blood loss: none. Procedure:                Pre-Anesthesia Assessment:                           - Prior to the procedure, a History and Physical                            was performed, and patient medications and                            allergies were reviewed. The patient's tolerance of                            previous anesthesia was also reviewed. The risks                            and benefits of the procedure and the sedation                            options and risks were discussed with the patient.                            All questions were answered, and informed consent                            was obtained. Prior Anticoagulants: The patient has                            taken Plavix (clopidogrel), last dose was 4 days                            prior to procedure. ASA Grade Assessment: II - A                            patient with mild systemic disease. After reviewing  the risks and benefits, the patient was deemed in                            satisfactory condition to undergo the procedure.                           After obtaining informed consent, the scope was                            passed under direct vision. Throughout the                            procedure, the patient's blood pressure, pulse, and                            oxygen  saturations were monitored continuously. The                            [id number] ([id number]) scope was introduced through                            the mouth, and used to inject contrast into and                            used to inject contrast into the bile duct. The                            ERCP was accomplished without difficulty. The                            patient tolerated the procedure well. Scope In: Scope Out: Findings:      The major papilla was normal. The bile duct was deeply cannulated.       Contrast was injected. I personally interpreted the bile duct images.       Ductal flow of contrast was adequate. Image quality was adequate.       Contrast extended to the entire biliary tree. The lower third of the       main bile duct contained one stone, which was 5 mm in diameter. A wire       was passed into the biliary tree. A 6 mm biliary sphincterotomy was made       with a traction (standard) sphincterotome using blended current. There       was no post-sphincterotomy bleeding. The biliary tree was swept with a       15 mm balloon starting at the upper third of the main bile duct. Sludge       was swept from the duct. One stone was removed. No stones remained. Impression:               - The major papilla appeared normal.                           - Choledocholithiasis was found. Complete removal  was accomplished by biliary sphincterotomy and                            balloon extraction.                           - A biliary sphincterotomy was performed.                           - The biliary tree was swept. Recommendation:           - Refer to a surgeon today. Procedure Code(s):        --- Professional ---                           [phone], Endoscopic retrograde                            cholangiopancreatography (ERCP); with removal of                            calculi/debris from biliary/pancreatic duct(s)                           43262,  Endoscopic retrograde                            cholangiopancreatography (ERCP); with                            sphincterotomy/papillotomy Diagnosis Code(s):        --- Professional ---                           K80.50, Calculus of bile duct without cholangitis                            or cholecystitis without obstruction CPT copyright 2016 [employer] Association. All rights reserved. The codes documented in this report are preliminary and upon coder review may  be revised to meet current compliance requirements. Missy Sabins, MD 12/20/2016 12:52:05 PM This report has been signed electronically. Number of Addenda: 0

## 2016-12-20 NOTE — Consult Note (Signed)
Reason for Consult: Cholecystitis/choledocholithiasis Referring Physician: Dr. Fanny Bien PCP:  Christie [place], [place] Carol [name] is an 61 y.o. female.  Chief complaint:   right upper quadrant pain with nausea and vomiting. Vomiting  HPI: Patient presented to the ED yesterday with severe abdominal pain in her right upper quadrant. It started in the p.m. 12/18/16 after dinner. She developed nausea and vomiting but no fevers, and she presented to the ED at [facility] around 3 AM yesterday. Workup in the ED shows she is hypertensive but afebrile. Labs showed an AST of 686, ALT 759, bilirubin of 1.3. WBC was normal. Lipase was normal. UA was normal. Abdominal ultrasound shows sludge and gallstones within the lumen of the gallbladder. There was 6 mm mural thickening of the gallbladder wall. There is a trace pericholecystic fluid. Calculi measuring up to 5 mm. Common bile duct was 6 mm with a 5 mm stone visible within the common bile duct.  She was transferred to [facility], because GI services were not available in [place]. She was admitted by Medicine service, and seen in consultation by Dr. Teena Irani, GI service. She is on chronic Plavix and took her last dose on 12/16/16. She reports a milder episode for which she did not seek medical attention. She is scheduled for ERCP today. She was also started on Zosyn.  We are asked to see.       Past Medical History:  Diagnosis Date  . Bipolar affective disorder ([place]) Schizophrenia Fibromyalgia Interstitial Cystitis, controlled, - non functional implant   . Coronary artery disease   . Hyperlipidemia   . Hypertension   . Hypothyroid   . Myocardial infarction ([place])   . Stroke [facility])     Past Surgical History:  Procedure Laterality Date  . ABDOMINAL HYSTERECTOMY    . APPENDECTOMY    . CARDIAC CATHETERIZATION Left 09/26/2015   Procedure: Left Heart Cath and Coronary Angiography;  Surgeon: Teodoro Spray, MD;  Location: [place] [facility];   Service: Cardiovascular;  Laterality: Left;  . HYSTEROTOMY      Family History  Problem Relation Age of Onset  . CAD      Social History:  reports that she has quit smoking. She has a 40.00 pack-year smoking history. She has never used smokeless tobacco. She reports that she does not drink alcohol or use drugs. Tobacco:  Up to 2 packs per day, currently down to less than 1 pack per day  47 years EtOH:  She started drinking as  a teen. She drank heavily 2008 - 2009. She stopped in 2009. Drugs: None   Allergies:  Allergies  Allergen Reactions  . Morphine Other (See Comments)    Reaction:  Hallucinations   . Pregabalin Other (See Comments)    Pt states that she was told not to take this medication.    . Rofecoxib Other (See Comments)    Reaction:  Burning of stomach   . Sulfamethoxazole-Trimethoprim Swelling    Reaction:  Unknown   . Asa [Aspirin] Hives, Nausea Only and Other (See Comments)    Reaction:  Burning of stomach     Medications:  Prior to Admission:  Prescriptions Prior to Admission  Medication Sig Dispense Refill Last Dose  . albuterol (PROVENTIL HFA;VENTOLIN HFA) 108 (90 BASE) MCG/ACT inhaler Inhale 2 puffs into the lungs every 6 (six) hours as needed for wheezing or shortness of breath.   Past Month at Unknown time  . albuterol (PROVENTIL) (2.5 MG/3ML) 0.083%  nebulizer solution Take 2.5 mg by nebulization every 6 (six) hours as needed for wheezing or shortness of breath.   Past Month at Unknown time  . atorvastatin (LIPITOR) 80 MG tablet Take 80 mg by mouth daily.   12/18/2016 at Unknown time  . cetirizine (ZYRTEC) 10 MG tablet Take 10 mg by mouth daily as needed.    12/18/2016 at Unknown time  . citalopram (CELEXA) 40 MG tablet Take 40 mg by mouth daily.   12/18/2016 at Unknown time  . ferrous sulfate 325 (65 FE) MG tablet Take 325 mg by mouth every 7 (seven) days. Pt takes on Saturday.   12/12/2016 at Unknown time  . isosorbide mononitrate (IMDUR) 30 MG 24 hr tablet Take  30 mg by mouth daily.   12/18/2016 at Unknown time  . levothyroxine (SYNTHROID, LEVOTHROID) 100 MCG tablet Take 100 mcg by mouth daily.   12/18/2016 at Unknown time  . lisinopril (PRINIVIL,ZESTRIL) 5 MG tablet Take 5 mg by mouth daily.   12/18/2016 at Unknown time  . lubiprostone (AMITIZA) 8 MCG capsule Take 8 mcg by mouth 2 (two) times daily as needed for constipation.   Past Week at Unknown time  . metoprolol tartrate (LOPRESSOR) 25 MG tablet Take 25 mg by mouth 2 (two) times daily.   12/17/2016 at 8a  . traMADol (ULTRAM) 50 MG tablet Take 50 mg by mouth every 6 (six) hours as needed for moderate pain.   Past Week at Unknown time  . clopidogrel (PLAVIX) 75 MG tablet Take 75 mg by mouth daily.    12/16/2016  . HYDROcodone-acetaminophen (NORCO) 5-325 MG tablet Take 1 tablet by mouth every 6 (six) hours as needed for severe pain. (Patient not taking: Reported on 12/19/2016) 12 tablet 0 Not Taking at Unknown time  . lidocaine (LIDODERM) 5 % Place 1 patch onto the skin every 12 (twelve) hours. Remove & Discard patch within 12 hours or as directed by MD (Patient not taking: Reported on 12/19/2016) 10 patch 0 Not Taking at Unknown time  . [DISCONTINUED] valACYclovir (VALTREX) 1000 MG tablet Take 1 tablet (1,000 mg total) by mouth 3 (three) times daily. 30 tablet 0    Anti-infectives    Start     Dose/Rate Route Frequency Ordered Stop   12/20/16 0930  [MAR Hold]  piperacillin-tazobactam (ZOSYN) IVPB 3.375 g     (MAR Hold since 12/20/16 1044)   3.375 g 12.5 mL/hr over 240 Minutes Intravenous Every 8 hours 12/20/16 0910     12/19/16 1500  ciprofloxacin (CIPRO) IVPB 400 mg  Status:  Discontinued     400 mg 200 mL/hr over 60 Minutes Intravenous Every 12 hours 12/19/16 1533 12/20/16 1019   12/19/16 1000  cefTRIAXone (ROCEPHIN) 1 g in dextrose 5 % 50 mL IVPB  Status:  Discontinued     1 g 100 mL/hr over 30 Minutes Intravenous Every 24 hours 12/19/16 0905 12/20/16 0849      Results for orders placed or performed  during the hospital encounter of 12/19/16 (from the past 48 hour(s))  TSH     Status: None   Collection Time: 12/19/16  8:44 AM  Result Value Ref Range   TSH 2.921 0.350 - 4.500 uIU/mL    Comment: Performed by a 3rd Generation assay with a functional sensitivity of <=0.01 uIU/mL.  Surgical pcr screen     Status: None   Collection Time: 12/20/16  2:02 AM  Result Value Ref Range   MRSA, PCR NEGATIVE NEGATIVE   Staphylococcus aureus  NEGATIVE NEGATIVE    Comment:        The Xpert SA Assay (FDA approved for NASAL specimens in patients over 53 years of age), is one component of a comprehensive surveillance program.  Test performance has been validated by [facility] for patients greater than or equal to 36 year old. It is not intended to diagnose infection nor to guide or monitor treatment.   Comprehensive metabolic panel     Status: Abnormal   Collection Time: 12/20/16  6:52 AM  Result Value Ref Range   Sodium 137 135 - 145 mmol/L   Potassium 3.5 3.5 - 5.1 mmol/L   Chloride 108 101 - 111 mmol/L   CO2 23 22 - 32 mmol/L   Glucose, Bld 105 (H) 65 - 99 mg/dL   BUN 7 6 - 20 mg/dL   Creatinine, Ser 0.72 0.44 - 1.00 mg/dL   Calcium 8.8 (L) 8.9 - 10.3 mg/dL   Total Protein 5.8 (L) 6.5 - 8.1 g/dL   Albumin 3.2 (L) 3.5 - 5.0 g/dL   AST 152 (H) 15 - 41 U/L   ALT 373 (H) 14 - 54 U/L   Alkaline Phosphatase 368 (H) 38 - 126 U/L   Total Bilirubin 0.6 0.3 - 1.2 mg/dL   GFR calc non Af Amer >60 >60 mL/min   GFR calc Af Amer >60 >60 mL/min    Comment: (NOTE) The eGFR has been calculated using the CKD EPI equation. This calculation has not been validated in all clinical situations. eGFR's persistently <60 mL/min signify possible Chronic Kidney Disease.    Anion gap 6 5 - 15  CBC     Status: Abnormal   Collection Time: 12/20/16  6:52 AM  Result Value Ref Range   WBC 5.9 4.0 - 10.5 K/uL   RBC 3.66 (L) 3.87 - 5.11 MIL/uL   Hemoglobin 11.1 (L) 12.0 - 15.0 g/dL   HCT 35.6 (L) 36.0 - 46.0 %    MCV 97.3 78.0 - 100.0 fL   MCH 30.3 26.0 - 34.0 pg   MCHC 31.2 30.0 - 36.0 g/dL   RDW 12.4 11.5 - 15.5 %   Platelets 170 150 - 400 K/uL    US Abdomen Limited Ruq  Result Date: 12/19/2016 CLINICAL DATA:  Right upper quadrant pain and vomiting for 2 days EXAM: US ABDOMEN LIMITED - RIGHT UPPER QUADRANT COMPARISON:  None. FINDINGS: Gallbladder: Calculi and sludge within the gallbladder lumen. The patient was tender to probe pressure over the gallbladder. There is gallbladder mural thickening to 6 mm. There is a trace pericholecystic fluid. The calculi measure up to 5 mm. Common bile duct: Diameter: 6.0 mm.  There is a 5 mm stone visible in the distal CBD Liver: No focal lesion identified. Within normal limits in parenchymal echogenicity. IMPRESSION: 1. Cholelithiasis and choledocholithiasis. 2. Gallbladder mural thickening, pericholecystic fluid and positive ultrasound Murphy sign, suspicious for acute cholecystitis. Electronically Signed   By: Andreas Newport M.D.   On: 12/19/2016 01:43    Review of Systems  Constitutional: Negative.   HENT: Positive for hearing loss (hearing aids bilat).   Eyes: Negative.   Respiratory: Positive for cough and wheezing. Negative for hemoptysis, sputum production and shortness of breath.   Cardiovascular: Negative.   Gastrointestinal: Positive for abdominal pain, constipation, heartburn, nausea and vomiting. Negative for blood in stool, diarrhea and melena.  Genitourinary: Negative.   Musculoskeletal: Negative.   Skin: Negative.   Neurological: Negative.   Endo/Heme/Allergies: Negative.   Psychiatric/Behavioral: Positive  for substance abuse (She has a history of substance abuse including alcohol after a family member death   10-11-2006 2007-10-12.   ongoing tobacco use). The patient is nervous/anxious.        On disability for her bipolar disease and schizophrenia   Blood pressure (!) 136/45, pulse (!) 58, temperature 97.9 F (36.6 C), temperature source Oral,  resp. rate 18, SpO2 95 %. Physical Exam  Constitutional: She is oriented to person, place, and time. She appears well-developed and well-nourished. No distress.  Very anxious and tearful woman, post ERCP. Tearful especially after palpation of her abdomen. She is in no acute distress, moves well in bed without difficulty.  She appears older than her stated age.  HENT:  Head: Normocephalic and atraumatic.  Mouth/Throat: No oropharyngeal exudate.  Eyes:  Pupils are equal  Neck: Normal range of motion. Neck supple. No JVD present. No tracheal deviation present. No thyromegaly present.  Cardiovascular: Normal rate, regular rhythm, normal heart sounds and intact distal pulses.   No murmur heard. Respiratory: Effort normal and breath sounds normal. No respiratory distress. She has no wheezes. She has no rales. She exhibits no tenderness.  GI: Soft. Bowel sounds are normal. She exhibits no distension and no mass. There is tenderness (Still tender right upper quadrant, tearful with palpation.). There is no rebound and no guarding.  Musculoskeletal: She exhibits no edema or tenderness.  Lymphadenopathy:    She has no cervical adenopathy.  Neurological: She is alert and oriented to person, place, and time. No cranial nerve deficit.  Skin: Skin is warm and dry. No rash noted. She is not diaphoretic. No erythema. No pallor.  Psychiatric: She has a normal mood and affect. Her behavior is normal. Judgment and thought content normal.    Assessment/Plan: Cholelithiasis/choledocholithiasis Elevated LFTs - improving Bipolar affective disorder/history of schizophrenia. Interstitial cystitis - resolved. Fibromyalgia History of alcohol use - none since 10/12/2007 COPD/tobacco use - ongoing   2 PPD - now down to less than 1 pack per day. Hypertension Hypothyroid History of MI with cath in 2013/repeat cath 10-12-2015 - no significant occlusive disease. History of CVA 11/25/16 with residual right-sided weakness  - she  uses a cane outside for ambulation  Plan: I recommend clear liquids this evening. Continue IV fluids. Continue current antibiotics. Recheck labs in a.m. If her pain has resolved we'll tentatively plan surgery tomorrow; laparoscopic cholecystectomy with IOC. Risk and benefits were discussed with her and her husband. I've added consent for tomorrow.  Shonn Farruggia 12/20/2016, 10:46 AM

## 2016-12-20 NOTE — Interval H&P Note (Signed)
History and Physical Interval Note:  12/20/2016 11:36 AM  Carol [name]  has presented today for surgery, with the diagnosis of CBD stones  The various methods of treatment have been discussed with the patient and family. After consideration of risks, benefits and other options for treatment, the patient has consented to  Procedure(s): ENDOSCOPIC RETROGRADE CHOLANGIOPANCREATOGRAPHY (ERCP) (N/A) as a surgical intervention .  The patient's history has been reviewed, patient examined, no change in status, stable for surgery.  I have reviewed the patient's chart and labs.  Questions were answered to the patient's satisfaction.     [name]

## 2016-12-20 NOTE — Progress Notes (Signed)
Pharmacy Antibiotic Note  Carol [name] is a 61 y.o. female admitted on 12/19/2016 with intra-abdominal infection.  Pharmacy has been consulted for Zosyn dosing. Received ceftriaxone at [facility]. Also has cipro ordered per MD for surgical prophylaxis. Afebrile, WBC wnl. SCr stable 0.72, CrCL~60.  Plan: Zosyn 3.375g IV q8h (4h infusion) Monitor clinical progress, c/s, renal function F/u de-escalation plan/LOT D/c cipro?      Temp (24hrs), Avg:98.1 F (36.7 C), Min:97.9 F (36.6 C), Max:98.3 F (36.8 C)   Recent Labs Lab 12/18/16 2055 12/20/16 0652  WBC 5.7 5.9  CREATININE 0.57 0.72    Estimated Creatinine Clearance: 60.7 mL/min (by C-G formula based on SCr of 0.72 mg/dL).    Allergies  Allergen Reactions  . Morphine Other (See Comments)    Reaction:  Hallucinations   . Pregabalin Other (See Comments)    Pt states that she was told not to take this medication.    . Rofecoxib Other (See Comments)    Reaction:  Burning of stomach   . Sulfamethoxazole-Trimethoprim Swelling    Reaction:  Unknown   . Asa [Aspirin] Hives, Nausea Only and Other (See Comments)    Reaction:  Burning of stomach     Elicia Lamp, PharmD, BCPS Clinical Pharmacist 12/20/2016 9:08 AM

## 2016-12-20 NOTE — H&P (View-Only) (Signed)
PROGRESS NOTE    Carol [name]  OHY:[id number] DOB: Sep 25, 1955 DOA: 12/19/2016 PCP: Carol Nottingham, PA  Brief Narrative:Carol [name] is a 61 y.o. female with medical history significant of coronary artery disease with myocardial infarction and left heart catheterization 7 years ago, stroke in April 2018, hypertension, bipolar disorder, hyperlipidemia, hypothyroidism who presents emergency department at [facility] with complaints of abdominal pain, nausea and vomiting. Abdominal ultrasound revealed cholelithiasis with choledocholithiasis with a 5 mm stone in the common bile duct which was enlarged to 6 mm. Her AST was 686, ALT 759, alkaline phosphatase 477, total bili 1.3. Lipase was normal at 45   Assessment & Plan:   Principal Problem:   Choledocholithiasis with acute cholecystitis -stop Ceftriaxone, change to IV Zosyn -[facility] consulting, plan for ERCP today -CCS consult for Lap chole -bili and LFTS improving -NPO, IVF, supportive care   CAD -history of MI/ cardiac catheterization revealed no need for stent she had good collateral blood flow,  approximately 7 years ago.  -plavix held, resume metoprolol, imdur on hold   Essential hypertension: -monitor, imdur, ACE on hold -resume BB.   Hypothyroidism:  -resume synthroid    History of stroke: - 11/15/2016. Plavix is currently on hold.     bipolar disorder:  -Hold home medications, stable at this time  DVT prophylaxis: SCDs Code Status: Full Family Communication: spouse at bedside Disposition Plan: Hopefully home in few days pending  Consultants:   GI  CCS  Antimicrobials:  Ceftriaxone 5/6-5/7  Zosyn 5/7-   Subjective: Having a lot of R sided pain, Dilaudid helping  Objective: Vitals:   12/19/16 1937 12/20/16 0641 12/20/16 1008 12/20/16 1045  BP:  (!) 136/45  (!) 176/62  Pulse:  (!) 58  (!) 54  Resp:  18  18  Temp:  97.9 F (36.6 C)  97.8 F (36.6 C)  TempSrc:  Oral  Oral    SpO2: 98% 92% 95% 92%   No intake or output data in the 24 hours ending 12/20/16 1108 There were no vitals filed for this visit.  Examination:  General exam: Appears uncomfortable in pain Respiratory system: diminished BS  Cardiovascular system: S1 & S2 heard, RRR. No JVD, murmurs Gastrointestinal system: Abdomen is nondistended, soft and nontender.Normal bowel sounds heard. Central nervous system: Alert and oriented. No focal neurological deficits. Extremities: Symmetric 5 x 5 power. Skin: No rashes, lesions or ulcers Psychiatry: Judgement and insight appear normal. Mood & affect appropriate.     Data Reviewed:   CBC:  Recent Labs Lab 12/18/16 2055 12/20/16 0652  WBC 5.7 5.9  HGB 12.5 11.1*  HCT 37.5 35.6*  MCV 93.7 97.3  PLT 207 948   Basic Metabolic Panel:  Recent Labs Lab 12/18/16 2055 12/20/16 0652  NA 139 137  K 3.5 3.5  CL 107 108  CO2 24 23  GLUCOSE 98 105*  BUN 9 7  CREATININE 0.57 0.72  CALCIUM 9.4 8.8*   GFR: Estimated Creatinine Clearance: 60.7 mL/min (by C-G formula based on SCr of 0.72 mg/dL). Liver Function Tests:  Recent Labs Lab 12/18/16 2055 12/20/16 0652  AST 686* 152*  ALT 759* 373*  ALKPHOS 477* 368*  BILITOT 1.3* 0.6  PROT 7.4 5.8*  ALBUMIN 4.2 3.2*    Recent Labs Lab 12/18/16 2055  LIPASE 45   No results for input(s): AMMONIA in the last 168 hours. Coagulation Profile: No results for input(s): INR, PROTIME in the last 168 hours. Cardiac Enzymes:  Recent  Labs Lab 12/18/16 2055  TROPONINI <0.03   BNP (last 3 results) No results for input(s): PROBNP in the last 8760 hours. HbA1C: No results for input(s): HGBA1C in the last 72 hours. CBG: No results for input(s): GLUCAP in the last 168 hours. Lipid Profile: No results for input(s): CHOL, HDL, LDLCALC, TRIG, CHOLHDL, LDLDIRECT in the last 72 hours. Thyroid Function Tests:  Recent Labs  12/19/16 0844  TSH 2.921   Anemia Panel: No results for input(s):  VITAMINB12, FOLATE, FERRITIN, TIBC, IRON, RETICCTPCT in the last 72 hours. Urine analysis:    Component Value Date/Time   COLORURINE AMBER (A) 12/19/2016 0200   APPEARANCEUR CLEAR (A) 12/19/2016 0200   APPEARANCEUR Clear 06/21/2013 0939   LABSPEC 1.018 12/19/2016 0200   LABSPEC 1.005 06/21/2013 0939   PHURINE 5.0 12/19/2016 0200   GLUCOSEU NEGATIVE 12/19/2016 0200   GLUCOSEU Negative 06/21/2013 0939   HGBUR SMALL (A) 12/19/2016 0200   BILIRUBINUR NEGATIVE 12/19/2016 0200   BILIRUBINUR Negative 06/21/2013 0939   KETONESUR 20 (A) 12/19/2016 0200   PROTEINUR NEGATIVE 12/19/2016 0200   NITRITE NEGATIVE 12/19/2016 0200   LEUKOCYTESUR NEGATIVE 12/19/2016 0200   LEUKOCYTESUR Negative 06/21/2013 0939   Sepsis Labs: @LABRCNTIP (procalcitonin:4,lacticidven:4)  ) Recent Results (from the past 240 hour(s))  Surgical pcr screen     Status: None   Collection Time: 12/20/16  2:02 AM  Result Value Ref Range Status   MRSA, PCR NEGATIVE NEGATIVE Final   Staphylococcus aureus NEGATIVE NEGATIVE Final    Comment:        The Xpert SA Assay (FDA approved for NASAL specimens in patients over 61 years of age), is one component of a comprehensive surveillance program.  Test performance has been validated by [facility] for patients greater than or equal to 38 year old. It is not intended to diagnose infection nor to guide or monitor treatment.          Radiology Studies: US Abdomen Limited Ruq  Result Date: 12/19/2016 CLINICAL DATA:  Right upper quadrant pain and vomiting for 2 days EXAM: US ABDOMEN LIMITED - RIGHT UPPER QUADRANT COMPARISON:  None. FINDINGS: Gallbladder: Calculi and sludge within the gallbladder lumen. The patient was tender to probe pressure over the gallbladder. There is gallbladder mural thickening to 6 mm. There is a trace pericholecystic fluid. The calculi measure up to 5 mm. Common bile duct: Diameter: 6.0 mm.  There is a 5 mm stone visible in the distal CBD Liver: No  focal lesion identified. Within normal limits in parenchymal echogenicity. IMPRESSION: 1. Cholelithiasis and choledocholithiasis. 2. Gallbladder mural thickening, pericholecystic fluid and positive ultrasound Murphy sign, suspicious for acute cholecystitis. Electronically Signed   By: Andreas Newport M.D.   On: 12/19/2016 01:43        Scheduled Meds: . [MAR Hold] clotrimazole  1 Applicatorful Vaginal QHS  . [MAR Hold] mometasone-formoterol  2 puff Inhalation BID   Continuous Infusions: . [MAR Hold] piperacillin-tazobactam (ZOSYN)  IV       LOS: 1 day    Time spent: 57min    Domenic Polite, MD [facility] Pager [phone]  If 7PM-7AM, please contact night-coverage [email or web address] Password [email or web address] 12/20/2016, 11:08 AM

## 2016-12-20 NOTE — Anesthesia Postprocedure Evaluation (Signed)
Anesthesia Post Note  Patient: Carol [name]  Procedure(s) Performed: Procedure(s) (LRB): ENDOSCOPIC RETROGRADE CHOLANGIOPANCREATOGRAPHY (ERCP) (N/A)  Patient location during evaluation: PACU Anesthesia Type: General Level of consciousness: awake and alert Pain management: pain level controlled Vital Signs Assessment: post-procedure vital signs reviewed and stable Respiratory status: spontaneous breathing, nonlabored ventilation, respiratory function stable and patient connected to nasal cannula oxygen Cardiovascular status: blood pressure returned to baseline and stable Postop Assessment: no signs of nausea or vomiting Anesthetic complications: no       Last Vitals:  Vitals:   12/20/16 1320 12/20/16 1330  BP: (!) 152/43 (!) 164/67  Pulse: 74 65  Resp: (!) 24 (!) 31  Temp:            Effie Berkshire

## 2016-12-20 NOTE — Transfer of Care (Signed)
Immediate Anesthesia Transfer of Care Note  Patient: Carol [name]  Procedure(s) Performed: Procedure(s): ENDOSCOPIC RETROGRADE CHOLANGIOPANCREATOGRAPHY (ERCP) (N/A)  Patient Location: Endoscopy Unit  Anesthesia Type:General  Level of Consciousness: awake, oriented and patient cooperative  Airway & Oxygen Therapy: Patient Spontanous Breathing and Patient connected to nasal cannula oxygen  Post-op Assessment: Report given to RN and Post -op Vital signs reviewed and stable  Post vital signs: Reviewed  Last Vitals:  Vitals:   12/20/16 1045 12/20/16 1258  BP: (!) 176/62 (!) 173/51  Pulse: (!) 54 89  Resp: 18 (!) 25  Temp: 36.6 C 36.7 C    Last Pain:  Vitals:   12/20/16 1258  TempSrc: Oral  PainSc:       Patients Stated Pain Goal: 4 (77/11/65 7903)  Complications: No apparent anesthesia complications

## 2016-12-20 NOTE — Anesthesia Preprocedure Evaluation (Addendum)
Anesthesia Evaluation  Patient identified by MRN, date of birth, ID band Patient awake    Reviewed: Allergy & Precautions, NPO status , Patient's Chart, lab work & pertinent test results, reviewed documented beta blocker date and time   Airway Mallampati: II  TM Distance: >3 FB Neck ROM: Full    Dental  (+) Edentulous Upper, Edentulous Lower   Pulmonary former smoker,    breath sounds clear to auscultation       Cardiovascular hypertension, Pt. on medications and Pt. on home beta blockers + CAD and + Past MI   Rhythm:Regular Rate:Bradycardia     Neuro/Psych  Headaches, PSYCHIATRIC DISORDERS Bipolar Disorder CVA    GI/Hepatic negative GI ROS, Neg liver ROS,   Endo/Other  Hypothyroidism   Renal/GU negative Renal ROS  negative genitourinary   Musculoskeletal negative musculoskeletal ROS (+)   Abdominal   Peds negative pediatric ROS (+)  Hematology negative hematology ROS (+)   Anesthesia Other Findings - HLD  Reproductive/Obstetrics negative OB ROS                            Lab Results  Component Value Date   WBC 5.9 12/20/2016   HGB 11.1 (L) 12/20/2016   HCT 35.6 (L) 12/20/2016   MCV 97.3 12/20/2016   PLT 170 12/20/2016   Lab Results  Component Value Date   CREATININE 0.72 12/20/2016   BUN 7 12/20/2016   NA 137 12/20/2016   K 3.5 12/20/2016   CL 108 12/20/2016   CO2 23 12/20/2016   Lab Results  Component Value Date   INR 0.92 11/15/2016   INR 0.89 06/17/2016   INR 0.92 03/30/2015   Echo - Left ventricle: Wall thickness was increased in a pattern of mild   LVH. Systolic function was normal. The estimated ejection   fraction was in the range of 60% to 65%. - Aortic valve: There was mild regurgitation. - Mitral valve: There was mild regurgitation.  EKG: normal sinus rhythm.  Anesthesia Physical Anesthesia Plan  ASA: III  Anesthesia Plan: General   Post-op Pain  Management:    Induction: Intravenous, Rapid sequence and Cricoid pressure planned  Airway Management Planned: Oral ETT  Additional Equipment:   Intra-op Plan:   Post-operative Plan: Extubation in OR  Informed Consent: I have reviewed the patients History and Physical, chart, labs and discussed the procedure including the risks, benefits and alternatives for the proposed anesthesia with the patient or authorized representative who has indicated his/her understanding and acceptance.   Dental advisory given  Plan Discussed with: CRNA  Anesthesia Plan Comments:        Anesthesia Quick Evaluation

## 2016-12-20 NOTE — Progress Notes (Addendum)
PROGRESS NOTE    Carol [name]  DXI:[id number] DOB: 1956-01-07 DOA: 12/19/2016 PCP: Christie Nottingham, PA  Brief Narrative:Carol [name] is a 61 y.o. female with medical history significant of coronary artery disease with myocardial infarction and left heart catheterization 7 years ago, stroke in April 2018, hypertension, bipolar disorder, hyperlipidemia, hypothyroidism who presents emergency department at [facility] with complaints of abdominal pain, nausea and vomiting. Abdominal ultrasound revealed cholelithiasis with choledocholithiasis with a 5 mm stone in the common bile duct which was enlarged to 6 mm. Her AST was 686, ALT 759, alkaline phosphatase 477, total bili 1.3. Lipase was normal at 45   Assessment & Plan:   Principal Problem:   Choledocholithiasis with acute cholecystitis -stop Ceftriaxone, change to IV Zosyn -[facility] consulting, plan for ERCP today -CCS consult for Lap chole -bili and LFTS improving -NPO, IVF, supportive care   CAD -history of MI/ cardiac catheterization revealed no need for stent she had good collateral blood flow,  approximately 7 years ago.  -plavix held, resume metoprolol, imdur on hold   Essential hypertension: -monitor, imdur, ACE on hold -resume BB.   Hypothyroidism:  -resume synthroid    History of stroke: - 11/15/2016. Plavix is currently on hold.     bipolar disorder:  -Hold home medications, stable at this time  DVT prophylaxis: SCDs Code Status: Full Family Communication: spouse at bedside Disposition Plan: Hopefully home in few days pending  Consultants:   GI  CCS  Antimicrobials:  Ceftriaxone 5/6-5/7  Zosyn 5/7-   Subjective: Having a lot of R sided pain, Dilaudid helping  Objective: Vitals:   12/19/16 1937 12/20/16 0641 12/20/16 1008 12/20/16 1045  BP:  (!) 136/45  (!) 176/62  Pulse:  (!) 58  (!) 54  Resp:  18  18  Temp:  97.9 F (36.6 C)  97.8 F (36.6 C)  TempSrc:  Oral  Oral    SpO2: 98% 92% 95% 92%   No intake or output data in the 24 hours ending 12/20/16 1108 There were no vitals filed for this visit.  Examination:  General exam: Appears uncomfortable in pain Respiratory system: diminished BS  Cardiovascular system: S1 & S2 heard, RRR. No JVD, murmurs Gastrointestinal system: Abdomen is nondistended, soft and nontender.Normal bowel sounds heard. Central nervous system: Alert and oriented. No focal neurological deficits. Extremities: Symmetric 5 x 5 power. Skin: No rashes, lesions or ulcers Psychiatry: Judgement and insight appear normal. Mood & affect appropriate.     Data Reviewed:   CBC:  Recent Labs Lab 12/18/16 2055 12/20/16 0652  WBC 5.7 5.9  HGB 12.5 11.1*  HCT 37.5 35.6*  MCV 93.7 97.3  PLT 207 767   Basic Metabolic Panel:  Recent Labs Lab 12/18/16 2055 12/20/16 0652  NA 139 137  K 3.5 3.5  CL 107 108  CO2 24 23  GLUCOSE 98 105*  BUN 9 7  CREATININE 0.57 0.72  CALCIUM 9.4 8.8*   GFR: Estimated Creatinine Clearance: 60.7 mL/min (by C-G formula based on SCr of 0.72 mg/dL). Liver Function Tests:  Recent Labs Lab 12/18/16 2055 12/20/16 0652  AST 686* 152*  ALT 759* 373*  ALKPHOS 477* 368*  BILITOT 1.3* 0.6  PROT 7.4 5.8*  ALBUMIN 4.2 3.2*    Recent Labs Lab 12/18/16 2055  LIPASE 45   No results for input(s): AMMONIA in the last 168 hours. Coagulation Profile: No results for input(s): INR, PROTIME in the last 168 hours. Cardiac Enzymes:  Recent  Labs Lab 12/18/16 2055  TROPONINI <0.03   BNP (last 3 results) No results for input(s): PROBNP in the last 8760 hours. HbA1C: No results for input(s): HGBA1C in the last 72 hours. CBG: No results for input(s): GLUCAP in the last 168 hours. Lipid Profile: No results for input(s): CHOL, HDL, LDLCALC, TRIG, CHOLHDL, LDLDIRECT in the last 72 hours. Thyroid Function Tests:  Recent Labs  12/19/16 0844  TSH 2.921   Anemia Panel: No results for input(s):  VITAMINB12, FOLATE, FERRITIN, TIBC, IRON, RETICCTPCT in the last 72 hours. Urine analysis:    Component Value Date/Time   COLORURINE AMBER (A) 12/19/2016 0200   APPEARANCEUR CLEAR (A) 12/19/2016 0200   APPEARANCEUR Clear 06/21/2013 0939   LABSPEC 1.018 12/19/2016 0200   LABSPEC 1.005 06/21/2013 0939   PHURINE 5.0 12/19/2016 0200   GLUCOSEU NEGATIVE 12/19/2016 0200   GLUCOSEU Negative 06/21/2013 0939   HGBUR SMALL (A) 12/19/2016 0200   BILIRUBINUR NEGATIVE 12/19/2016 0200   BILIRUBINUR Negative 06/21/2013 0939   KETONESUR 20 (A) 12/19/2016 0200   PROTEINUR NEGATIVE 12/19/2016 0200   NITRITE NEGATIVE 12/19/2016 0200   LEUKOCYTESUR NEGATIVE 12/19/2016 0200   LEUKOCYTESUR Negative 06/21/2013 0939   Sepsis Labs: @LABRCNTIP (procalcitonin:4,lacticidven:4)  ) Recent Results (from the past 240 hour(s))  Surgical pcr screen     Status: None   Collection Time: 12/20/16  2:02 AM  Result Value Ref Range Status   MRSA, PCR NEGATIVE NEGATIVE Final   Staphylococcus aureus NEGATIVE NEGATIVE Final    Comment:        The Xpert SA Assay (FDA approved for NASAL specimens in patients over 81 years of age), is one component of a comprehensive surveillance program.  Test performance has been validated by [facility] for patients greater than or equal to 32 year old. It is not intended to diagnose infection nor to guide or monitor treatment.          Radiology Studies: US Abdomen Limited Ruq  Result Date: 12/19/2016 CLINICAL DATA:  Right upper quadrant pain and vomiting for 2 days EXAM: US ABDOMEN LIMITED - RIGHT UPPER QUADRANT COMPARISON:  None. FINDINGS: Gallbladder: Calculi and sludge within the gallbladder lumen. The patient was tender to probe pressure over the gallbladder. There is gallbladder mural thickening to 6 mm. There is a trace pericholecystic fluid. The calculi measure up to 5 mm. Common bile duct: Diameter: 6.0 mm.  There is a 5 mm stone visible in the distal CBD Liver: No  focal lesion identified. Within normal limits in parenchymal echogenicity. IMPRESSION: 1. Cholelithiasis and choledocholithiasis. 2. Gallbladder mural thickening, pericholecystic fluid and positive ultrasound Murphy sign, suspicious for acute cholecystitis. Electronically Signed   By: Andreas Newport M.D.   On: 12/19/2016 01:43        Scheduled Meds: . [MAR Hold] clotrimazole  1 Applicatorful Vaginal QHS  . [MAR Hold] mometasone-formoterol  2 puff Inhalation BID   Continuous Infusions: . [MAR Hold] piperacillin-tazobactam (ZOSYN)  IV       LOS: 1 day    Time spent: 73min    Domenic Polite, MD [facility] Pager [phone]  If 7PM-7AM, please contact night-coverage [email or web address] Password [email or web address] 12/20/2016, 11:08 AM

## 2016-12-20 NOTE — Anesthesia Procedure Notes (Signed)
Procedure Name: Intubation Date/Time: 12/20/2016 11:58 AM Performed by: Jenne Campus Pre-anesthesia Checklist: Patient identified, Emergency Drugs available, Suction available, Patient being monitored and Timeout performed Patient Re-evaluated:Patient Re-evaluated prior to inductionOxygen Delivery Method: Circle system utilized Preoxygenation: Pre-oxygenation with 100% oxygen Intubation Type: IV induction and Rapid sequence Laryngoscope Size: Mac and 3 Grade View: Grade I Tube type: Oral Tube size: 7.0 mm Number of attempts: 1 Airway Equipment and Method: Stylet Placement Confirmation: ETT inserted through vocal cords under direct vision,  positive ETCO2 and breath sounds checked- equal and bilateral Secured at: 22 cm Tube secured with: Tape Dental Injury: Teeth and Oropharynx as per pre-operative assessment

## 2016-12-21 ENCOUNTER — Encounter (HOSPITAL_COMMUNITY): Payer: Self-pay | Admitting: Gastroenterology

## 2016-12-21 LAB — COMPREHENSIVE METABOLIC PANEL
ALBUMIN: 3.2 g/dL — AB (ref 3.5–5.0)
ALK PHOS: 404 U/L — AB (ref 38–126)
ALT: 535 U/L — AB (ref 14–54)
AST: 430 U/L — AB (ref 15–41)
Anion gap: 7 (ref 5–15)
BILIRUBIN TOTAL: 1.6 mg/dL — AB (ref 0.3–1.2)
BUN: 6 mg/dL (ref 6–20)
CALCIUM: 9.2 mg/dL (ref 8.9–10.3)
CO2: 24 mmol/L (ref 22–32)
Chloride: 108 mmol/L (ref 101–111)
Creatinine, Ser: 0.74 mg/dL (ref 0.44–1.00)
GFR calc Af Amer: >60 mL/min (ref >60)
GFR calc non Af Amer: >60 mL/min (ref >60)
GLUCOSE: 129 mg/dL — AB (ref 65–99)
Potassium: 3.8 mmol/L (ref 3.5–5.1)
Sodium: 139 mmol/L (ref 135–145)
TOTAL PROTEIN: 6 g/dL — AB (ref 6.5–8.1)

## 2016-12-21 LAB — CBC
HEMATOCRIT: 34.9 % — AB (ref 36.0–46.0)
HEMOGLOBIN: 11.4 g/dL — AB (ref 12.0–15.0)
MCH: 31.5 pg (ref 26.0–34.0)
MCHC: 32.7 g/dL (ref 30.0–36.0)
MCV: 96.4 fL (ref 78.0–100.0)
Platelets: 170 10*3/uL (ref 150–400)
RBC: 3.62 MIL/uL — ABNORMAL LOW (ref 3.87–5.11)
RDW: 12.5 % (ref 11.5–15.5)
WBC: 7.7 10*3/uL (ref 4.0–10.5)

## 2016-12-21 LAB — LIPASE, BLOOD: Lipase: 740 U/L — ABNORMAL HIGH (ref 11–51)

## 2016-12-21 MED ORDER — HYDROMORPHONE HCL 1 MG/ML IJ SOLN
2.0000 mg | INTRAMUSCULAR | Status: DC | PRN
  Administered 2016-12-21 – 2016-12-23 (×7): 2 mg via INTRAVENOUS
  Filled 2016-12-21 (×7): qty 2

## 2016-12-21 MED ORDER — SODIUM CHLORIDE 0.9 % IV SOLN
INTRAVENOUS | Status: DC
  Administered 2016-12-21 – 2016-12-23 (×4): via INTRAVENOUS

## 2016-12-21 NOTE — Progress Notes (Signed)
PROGRESS NOTE    Carol [name]  BOF:[id number] DOB: Jan 21, 1956 DOA: 5/6/[name] PCP: Christie Nottingham, PA  Brief Narrative:Carol [name] Carol 61 y.o.femalewith medical history significant of coronary artery [name], Carol [name], Carol [name], Carol [name], Carol [name], Carol [name] who presented emergency department at [facility] with complaints of abdominal pain, nausea and vomiting. Abdominal ultrasound revealed cholelithiasis with choledocholithiasis with Carol 5 mm stone in the common bile duct which was enlarged to 6 mm. Her AST was 686, ALT 759, alkaline phosphatase 477, total bili 1.3. Lipase was normal at 45 s/p ERCP and stone retrieval 5/7 GI and [facility]CS following  Assessment & Plan:   Choledocholithiasis with acute cholecystitis -stopped Ceftriaxone, continue Zosyn -[facility] consulting, s/p ERCP 5/7 with stone retrieval, now with mild post ERCP pancreatitis -[facility] consulting for Lap chole -NPO/ice chips, increase IVF, supportive care  Mild Post ERCP Pancreatitis -supportive care as above   CAD -history of MI/ cardiac catheterization revealed no need for stent she had good collateral blood flow,  approximately 7 years ago.  -plavix held, resume metoprolol, imdur on hold   Essential Carol [name]: -monitor, imdur, ACE on hold -resumed BB.   Carol [name]:  -resumed synthroid    History of Carol: - 04/02/[name]. Plavix is currently on hold.  -resume post lap chole    Depression/Carol [name]:  -resume celexa  DVT prophylaxis:SCDs Code Status:Full Family Communication:spouse at bedside Disposition Plan:Hopefully home in few days pending lap chole  Consultants:   GI  CCS  Antimicrobials:  Ceftriaxone 5/6-5/7  Zosyn 5/7-    Consultants:   [facility]   Procedures:  ERCP: 5/7  Impression:               - The major papilla appeared normal.                           - Choledocholithiasis was found. Complete removal                              was accomplished by biliary sphincterotomy and                            balloon extraction.                           - Carol biliary sphincterotomy was performed.                            - The biliary tree was swept.  Antimicrobials:  Ceftriaxone 5/6-5/7  Zosyn 5/7-   Subjective: Having more upper abd pain today  Objective: Vitals:   12/21/16 0647 12/21/16 0930 12/21/16 1009 12/21/16 1311  BP: (!) 136/44  (!) 143/46 (!) 133/48  Pulse: (!) 55   (!) 54  Resp: 20   16  Temp: 98 F (36.7 C)   97.4 F (36.3 C)  TempSrc: Oral   Oral  SpO2: 99% 99%  98%    Intake/Output Summary (Last 24 hours) at 12/21/16 1409 Last data filed at 12/21/16 0900  Gross per 24 hour  Intake          1383.75 ml  Output                0 ml  Net  1383.75 ml   There were no vitals filed for this visit.  Examination:  Awake Alert, Oriented X 3, No new F.N deficits, Normal affect HEENT: [place].AT,PERRAL Supple Neck,No JVD,  Lungs: Symmetrical Chest wall movement, Good air movement bilaterally, CTAB CVS: RRR,No Gallops,Rubs or new Murmurs, No Parasternal Heave Abd: distended, tender in epigastrium, BS diminished, No rebound - guarding or rigidity. Ext: no edema   Data Reviewed:   CBC:  Recent Labs Lab 12/18/16 2055 12/20/16 0652 12/21/16 0532  WBC 5.7 5.9 7.7  HGB 12.5 11.1* 11.4*  HCT 37.5 35.6* 34.9*  MCV 93.7 97.3 96.4  PLT 207 170 665   Basic Metabolic Panel:  Recent Labs Lab 12/18/16 2055 12/20/16 0652 12/21/16 0532  NA 139 137 139  K 3.5 3.5 3.8  CL 107 108 108  CO2 24 23 24   GLUCOSE 98 105* 129*  BUN 9 7 6   CREATININE 0.57 0.72 0.74  CALCIUM 9.4 8.8* 9.2   GFR: Estimated Creatinine Clearance: 60.7 mL/min (by C-G formula based on SCr of 0.74 mg/dL). Liver Function Tests:  Recent Labs Lab 12/18/16 2055 12/20/16 0652 12/21/16 0532  AST 686* 152* 430*  ALT 759* 373* 535*  ALKPHOS 477* 368* 404*  BILITOT 1.3* 0.6 1.6*  PROT  7.4 5.8* 6.0*  ALBUMIN 4.2 3.2* 3.2*    Recent Labs Lab 12/18/16 2055 12/21/16 0532  LIPASE 45 740*   No results for input(s): AMMONIA in the last 168 hours. Coagulation Profile: No results for input(s): INR, PROTIME in the last 168 hours. Cardiac Enzymes:  Recent Labs Lab 12/18/16 2055  TROPONINI <0.03   BNP (last 3 results) No results for input(s): PROBNP in the last 8760 hours. HbA1C: No results for input(s): HGBA1C in the last 72 hours. CBG: No results for input(s): GLUCAP in the last 168 hours. Lipid Profile: No results for input(s): CHOL, HDL, LDLCALC, TRIG, CHOLHDL, LDLDIRECT in the last 72 hours. Thyroid Function Tests:  Recent Labs  12/19/16 0844  TSH 2.921   Anemia Panel: No results for input(s): VITAMINB12, FOLATE, FERRITIN, TIBC, IRON, RETICCTPCT in the last 72 hours. Urine analysis:    Component Value Date/Time   COLORURINE AMBER (Carol) 05/06/[name] 0200   APPEARANCEUR CLEAR (Carol) 05/06/[name] 0200   APPEARANCEUR Clear 06/21/2013 0939   LABSPEC 1.018 05/06/[name] 0200   LABSPEC 1.005 06/21/2013 0939   PHURINE 5.0 05/06/[name] 0200   GLUCOSEU NEGATIVE 05/06/[name] 0200   GLUCOSEU Negative 06/21/2013 0939   HGBUR SMALL (Carol) 05/06/[name] 0200   BILIRUBINUR NEGATIVE 05/06/[name] 0200   BILIRUBINUR Negative 06/21/2013 0939   KETONESUR 20 (Carol) 05/06/[name] 0200   PROTEINUR NEGATIVE 05/06/[name] 0200   NITRITE NEGATIVE 05/06/[name] 0200   LEUKOCYTESUR NEGATIVE 05/06/[name] 0200   LEUKOCYTESUR Negative 06/21/2013 0939   Sepsis Labs: @LABRCNTIP (procalcitonin:4,lacticidven:4)  ) Recent Results (from the past 240 hour(s))  Surgical pcr screen     Status: None   Collection Time: 12/20/16  2:02 AM  Result Value Ref Range Status   MRSA, PCR NEGATIVE NEGATIVE Final   Staphylococcus aureus NEGATIVE NEGATIVE Final    Comment:        The Xpert SA Assay (FDA approved for NASAL specimens in patients over 67 years of age), is one component of Carol comprehensive surveillance program.   Test performance has been validated by [facility] for patients greater than or equal to 57 year old. It is not intended to diagnose infection nor to guide or monitor treatment.          Radiology Studies:  Dg Ercp  Result Date: 5/7/[name] CLINICAL DATA:  CHOLELITHIASIS. CHOLEDOCHOLITHIASIS DEMONSTRATED BY ULTRASOUND. EXAM: ERCP with sphincterotomy and balloon sweep for stone removal TECHNIQUE: Multiple spot images obtained with the fluoroscopic device and submitted for interpretation post-procedure. FLUOROSCOPY TIME:  Fluoroscopy Time:  1 minutes 35 seconds COMPARISON:  05/06/[name] FINDINGS: Four spot fluoroscopic intraoperative views demonstrate retrograde cholangiogram. Biliary tree is normal in caliber without obstruction. Contrast refluxes into the gallbladder demonstrating cholelithiasis. Small distal CBD filling defects noted compatible with nonobstructing choledocholithiasis. Sphincterotomy and balloon sweep performed for removal. Please refer to the operative note for further details. IMPRESSION: Evidence of choledocholithiasis, status post balloon extraction Cholelithiasis No biliary obstruction These images were submitted for radiologic interpretation only. Please see the procedural report for the amount of contrast and the fluoroscopy time utilized. Electronically Signed   By: Jerilynn Mages.  Shick M.D.   On: 05/07/[name] 13:33        Scheduled Meds: . clotrimazole  1 Applicatorful Vaginal QHS  . levothyroxine  100 mcg Oral QAC breakfast  . metoprolol tartrate  12.5 mg Oral BID  . mometasone-formoterol  2 puff Inhalation BID   Continuous Infusions: . dextrose 5 % and 0.9 % NaCl with KCl 40 mEq/L 75 mL/hr at 12/20/16 1617  . piperacillin-tazobactam (ZOSYN)  IV 3.375 g (12/21/16 0930)     LOS: 2 days    Time spent: 89min    Domenic Polite, MD [facility] Pager [phone]  If 7PM-7AM, please contact night-coverage [email or web address] Password [email or web address] 5/8/[name], 2:09 PM

## 2016-12-21 NOTE — Progress Notes (Signed)
1 Day Post-Op   Subjective/Chief Complaint: Abdominal pain is worse than yesterday   Objective: Vital signs in last 24 hours: Temp:  [97.8 F (36.6 C)-98 F (36.7 C)] 98 F (36.7 C) (05/08 0647) Pulse Rate:  [54-89] 55 (05/08 0647) Resp:  [17-31] 20 (05/08 0647) BP: (136-176)/(43-74) 136/44 (05/08 0647) SpO2:  [91 %-100 %] 99 % (05/08 0647) Last BM Date: 12/18/16  Intake/Output from previous day: 05/07 0701 - 05/08 0700 In: 2443.8 [P.O.:340; I.V.:2103.8] Out: 1 [Blood:1] Intake/Output this shift: No intake/output data recorded.  General appearance: alert and cooperative Resp: clear to auscultation bilaterally Cardio: regular rate and rhythm GI: soft, moderate central tenderness  Lab Results:   Recent Labs  12/20/16 0652 12/21/16 0532  WBC 5.9 7.7  HGB 11.1* 11.4*  HCT 35.6* 34.9*  PLT 170 170   BMET  Recent Labs  12/20/16 0652 12/21/16 0532  NA 137 139  K 3.5 3.8  CL 108 108  CO2 23 24  GLUCOSE 105* 129*  BUN 7 6  CREATININE 0.72 0.74  CALCIUM 8.8* 9.2   PT/INR No results for input([name]): LABPROT, INR in the last 72 hours. ABG No results for input([name]): PHART, HCO3 in the last 72 hours.  Invalid input([name]): PCO2, PO2  Studies/Results: Dg Ercp  Result Date: 12/20/2016 CLINICAL DATA:  CHOLELITHIASIS. CHOLEDOCHOLITHIASIS DEMONSTRATED BY ULTRASOUND. EXAM: ERCP with sphincterotomy and balloon sweep for stone removal TECHNIQUE: Multiple spot images obtained with the fluoroscopic device and submitted for interpretation post-procedure. FLUOROSCOPY TIME:  Fluoroscopy Time:  1 minutes 35 seconds COMPARISON:  12/19/2016 FINDINGS: Four spot fluoroscopic intraoperative views demonstrate retrograde cholangiogram. Biliary tree is normal in caliber without obstruction. Contrast refluxes into the gallbladder demonstrating cholelithiasis. Small distal CBD filling defects noted compatible with nonobstructing choledocholithiasis. Sphincterotomy and balloon sweep performed for  removal. Please refer to the operative note for further details. IMPRESSION: Evidence of choledocholithiasis, status post balloon extraction Cholelithiasis No biliary obstruction These images were submitted for radiologic interpretation only. Please see the procedural report for the amount of contrast and the fluoroscopy time utilized. Electronically Signed   By: Carol Mages.  Shick M.D.   On: 12/20/2016 13:33    Anti-infectives: Anti-infectives    Start     Dose/Rate Route Frequency Ordered Stop   12/20/16 0930  piperacillin-tazobactam (ZOSYN) IVPB 3.375 g     3.375 g 12.5 mL/hr over 240 Minutes Intravenous Every 8 hours 12/20/16 0910     12/19/16 1500  ciprofloxacin (CIPRO) IVPB 400 mg  Status:  Discontinued     400 mg 200 mL/hr over 60 Minutes Intravenous Every 12 hours 12/19/16 1533 12/20/16 1019   12/19/16 1000  cefTRIAXone (ROCEPHIN) 1 g in dextrose 5 % 50 mL IVPB  Status:  Discontinued     1 g 100 mL/hr over 30 Minutes Intravenous Every 24 hours 12/19/16 0905 12/20/16 0849      Assessment/Plan: [name]/p Procedure([name]): ENDOSCOPIC RETROGRADE CHOLANGIOPANCREATOGRAPHY (ERCP) (N/A) She appears to have post ERCP pancreatits. will continue bowel rest and let this resolve before removing gallbadder. May have ice chips for now  LOS: 2 days    Carol [name],Carol [name] 12/21/2016

## 2016-12-21 NOTE — Consult Note (Addendum)
[facility] Progress Note  Subjective: Patient with increasing abdominal pain this morning, rise in LFTs and elevated lipase consistent with postprocedural pancreatitis. Single visualized stone and ERCP was removed and no wire advancement or contrast injection or other entry of the pancreatic duct was done.  Objective: Vital signs in last 24 hours: Temp:  [98 F (36.7 C)] 98 F (36.7 C) (05/08 0647) Pulse Rate:  [55-89] 55 (05/08 0647) Resp:  [17-31] 20 (05/08 0647) BP: (136-173)/(43-74) 143/46 (05/08 1009) SpO2:  [91 %-100 %] 99 % (05/08 0930) Weight change:    PE: Alert oriented with moderate epigastric abdominal tenderness   Lab Results: Results for orders placed or performed during the hospital encounter of 12/19/16 (from the past 24 hour(s))  CBC     Status: Abnormal   Collection Time: 12/21/16  5:32 AM  Result Value Ref Range   WBC 7.7 4.0 - 10.5 K/uL   RBC 3.62 (L) 3.87 - 5.11 MIL/uL   Hemoglobin 11.4 (L) 12.0 - 15.0 g/dL   HCT 34.9 (L) 36.0 - 46.0 %   MCV 96.4 78.0 - 100.0 fL   MCH 31.5 26.0 - 34.0 pg   MCHC 32.7 30.0 - 36.0 g/dL   RDW 12.5 11.5 - 15.5 %   Platelets 170 150 - 400 K/uL  Comprehensive metabolic panel     Status: Abnormal   Collection Time: 12/21/16  5:32 AM  Result Value Ref Range   Sodium 139 135 - 145 mmol/L   Potassium 3.8 3.5 - 5.1 mmol/L   Chloride 108 101 - 111 mmol/L   CO2 24 22 - 32 mmol/L   Glucose, Bld 129 (H) 65 - 99 mg/dL   BUN 6 6 - 20 mg/dL   Creatinine, Ser 0.74 0.44 - 1.00 mg/dL   Calcium 9.2 8.9 - 10.3 mg/dL   Total Protein 6.0 (L) 6.5 - 8.1 g/dL   Albumin 3.2 (L) 3.5 - 5.0 g/dL   AST 430 (H) 15 - 41 U/L   ALT 535 (H) 14 - 54 U/L   Alkaline Phosphatase 404 (H) 38 - 126 U/L   Total Bilirubin 1.6 (H) 0.3 - 1.2 mg/dL   GFR calc non Af Amer >60 >60 mL/min   GFR calc Af Amer >60 >60 mL/min   Anion gap 7 5 - 15  Lipase, blood     Status: Abnormal   Collection Time: 12/21/16  5:32 AM  Result Value Ref Range   Lipase  740 (H) 11 - 51 U/L    Studies/Results: Dg Ercp  Result Date: 12/20/2016 CLINICAL DATA:  CHOLELITHIASIS. CHOLEDOCHOLITHIASIS DEMONSTRATED BY ULTRASOUND. EXAM: ERCP with sphincterotomy and balloon sweep for stone removal TECHNIQUE: Multiple spot images obtained with the fluoroscopic device and submitted for interpretation post-procedure. FLUOROSCOPY TIME:  Fluoroscopy Time:  1 minutes 35 seconds COMPARISON:  12/19/2016 FINDINGS: Four spot fluoroscopic intraoperative views demonstrate retrograde cholangiogram. Biliary tree is normal in caliber without obstruction. Contrast refluxes into the gallbladder demonstrating cholelithiasis. Small distal CBD filling defects noted compatible with nonobstructing choledocholithiasis. Sphincterotomy and balloon sweep performed for removal. Please refer to the operative note for further details. IMPRESSION: Evidence of choledocholithiasis, status post balloon extraction Cholelithiasis No biliary obstruction These images were submitted for radiologic interpretation only. Please see the procedural report for the amount of contrast and the fluoroscopy time utilized. Electronically Signed   By: Jerilynn Mages.  Shick M.D.   On: 12/20/2016 13:33      Assessment: Choledocholithiasis status post ERCP with stone extraction Cholelithiasis with possible cholecystitis  Probable post ERCP pancreatitis  Plan: Supportive care, Cholecystectomy delayed to at least tomorrow. We'll continue to follow.    Lashone Stauber C 12/21/2016, 11:34 AM  Pager [phone] If no answer or after 5 PM call [phone]

## 2016-12-22 ENCOUNTER — Inpatient Hospital Stay (HOSPITAL_COMMUNITY): Payer: Medicare Other | Admitting: Anesthesiology

## 2016-12-22 ENCOUNTER — Inpatient Hospital Stay (HOSPITAL_COMMUNITY): Payer: Medicare Other

## 2016-12-22 ENCOUNTER — Encounter (HOSPITAL_COMMUNITY)
Admission: AD | Disposition: A | Discharge status: Home / Self Care | Payer: Self-pay | Source: Other Acute Inpatient Hospital | Attending: Internal Medicine

## 2016-12-22 ENCOUNTER — Encounter (HOSPITAL_COMMUNITY): Payer: Self-pay | Admitting: *Deleted

## 2016-12-22 HISTORY — PX: CHOLECYSTECTOMY: SHX55

## 2016-12-22 LAB — COMPREHENSIVE METABOLIC PANEL
ALBUMIN: 3.4 g/dL — AB (ref 3.5–5.0)
ALK PHOS: 357 U/L — AB (ref 38–126)
ALT: 375 U/L — ABNORMAL HIGH (ref 14–54)
ANION GAP: 7 (ref 5–15)
AST: 157 U/L — ABNORMAL HIGH (ref 15–41)
BILIRUBIN TOTAL: 0.5 mg/dL (ref 0.3–1.2)
BUN: 7 mg/dL (ref 6–20)
CALCIUM: 9.1 mg/dL (ref 8.9–10.3)
CO2: 26 mmol/L (ref 22–32)
Chloride: 109 mmol/L (ref 101–111)
Creatinine, Ser: 0.8 mg/dL (ref 0.44–1.00)
GFR calc non Af Amer: >60 mL/min (ref >60)
GLUCOSE: 78 mg/dL (ref 65–99)
Potassium: 3.5 mmol/L (ref 3.5–5.1)
Sodium: 142 mmol/L (ref 135–145)
TOTAL PROTEIN: 6.1 g/dL — AB (ref 6.5–8.1)

## 2016-12-22 LAB — CBC WITH DIFFERENTIAL/PLATELET
Basophils Absolute: 0 10*3/uL (ref 0.0–0.1)
Basophils Relative: 0 %
Eosinophils Absolute: 0 10*3/uL (ref 0.0–0.7)
Eosinophils Relative: 0 %
HEMATOCRIT: 35.4 % — AB (ref 36.0–46.0)
HEMOGLOBIN: 10.8 g/dL — AB (ref 12.0–15.0)
LYMPHS ABS: 2.1 10*3/uL (ref 0.7–4.0)
LYMPHS PCT: 30 %
MCH: 29.9 pg (ref 26.0–34.0)
MCHC: 30.5 g/dL (ref 30.0–36.0)
MCV: 98.1 fL (ref 78.0–100.0)
MONOS PCT: 8 %
Monocytes Absolute: 0.5 10*3/uL (ref 0.1–1.0)
NEUTROS ABS: 4.4 10*3/uL (ref 1.7–7.7)
NEUTROS PCT: 62 %
Platelets: 174 10*3/uL (ref 150–400)
RBC: 3.61 MIL/uL — ABNORMAL LOW (ref 3.87–5.11)
RDW: 12.5 % (ref 11.5–15.5)
WBC: 7.1 10*3/uL (ref 4.0–10.5)

## 2016-12-22 LAB — LIPASE, BLOOD: Lipase: 35 U/L (ref 11–51)

## 2016-12-22 SURGERY — LAPAROSCOPIC CHOLECYSTECTOMY WITH INTRAOPERATIVE CHOLANGIOGRAM
Anesthesia: General

## 2016-12-22 MED ORDER — DEXAMETHASONE SODIUM PHOSPHATE 10 MG/ML IJ SOLN
INTRAMUSCULAR | Status: AC
  Filled 2016-12-22: qty 1

## 2016-12-22 MED ORDER — BUPIVACAINE HCL 0.25 % IJ SOLN
INTRAMUSCULAR | Status: DC | PRN
  Administered 2016-12-22: 23 mL

## 2016-12-22 MED ORDER — ROCURONIUM BROMIDE 10 MG/ML (PF) SYRINGE
PREFILLED_SYRINGE | INTRAVENOUS | Status: AC
  Filled 2016-12-22: qty 5

## 2016-12-22 MED ORDER — SODIUM CHLORIDE 0.9 % IR SOLN
Status: DC | PRN
  Administered 2016-12-22: 1000 mL

## 2016-12-22 MED ORDER — ONDANSETRON HCL 4 MG/2ML IJ SOLN
INTRAMUSCULAR | Status: DC | PRN
  Administered 2016-12-22: 4 mg via INTRAVENOUS

## 2016-12-22 MED ORDER — FENTANYL CITRATE (PF) 100 MCG/2ML IJ SOLN
INTRAMUSCULAR | Status: AC
  Administered 2016-12-22: 50 ug via INTRAVENOUS
  Filled 2016-12-22: qty 2

## 2016-12-22 MED ORDER — SUGAMMADEX SODIUM 200 MG/2ML IV SOLN
INTRAVENOUS | Status: DC | PRN
  Administered 2016-12-22: 200 mg via INTRAVENOUS

## 2016-12-22 MED ORDER — OXYCODONE HCL 5 MG PO TABS
ORAL_TABLET | ORAL | Status: AC
  Administered 2016-12-22: 5 mg via ORAL
  Filled 2016-12-22: qty 1

## 2016-12-22 MED ORDER — METOPROLOL TARTRATE 5 MG/5ML IV SOLN
INTRAVENOUS | Status: AC
  Filled 2016-12-22: qty 5

## 2016-12-22 MED ORDER — BUPIVACAINE HCL (PF) 0.25 % IJ SOLN
INTRAMUSCULAR | Status: AC
  Filled 2016-12-22: qty 30

## 2016-12-22 MED ORDER — ARTIFICIAL TEARS OPHTHALMIC OINT
TOPICAL_OINTMENT | OPHTHALMIC | Status: AC
  Filled 2016-12-22: qty 3.5

## 2016-12-22 MED ORDER — ONDANSETRON HCL 4 MG/2ML IJ SOLN
INTRAMUSCULAR | Status: AC
  Filled 2016-12-22: qty 2

## 2016-12-22 MED ORDER — FENTANYL CITRATE (PF) 250 MCG/5ML IJ SOLN
INTRAMUSCULAR | Status: AC
  Filled 2016-12-22: qty 5

## 2016-12-22 MED ORDER — MIDAZOLAM HCL 5 MG/5ML IJ SOLN
INTRAMUSCULAR | Status: DC | PRN
  Administered 2016-12-22: 2 mg via INTRAVENOUS

## 2016-12-22 MED ORDER — PIPERACILLIN-TAZOBACTAM 3.375 G IVPB 30 MIN
3.3750 g | INTRAVENOUS | Status: AC
  Administered 2016-12-22: 3.375 g via INTRAVENOUS
  Filled 2016-12-22: qty 50

## 2016-12-22 MED ORDER — DEXTROSE 5 % IV SOLN
INTRAVENOUS | Status: DC | PRN
  Administered 2016-12-22: 11:00:00 via INTRAVENOUS

## 2016-12-22 MED ORDER — METOPROLOL TARTRATE 5 MG/5ML IV SOLN
INTRAVENOUS | Status: DC | PRN
  Administered 2016-12-22 (×2): 2.5 mg via INTRAVENOUS

## 2016-12-22 MED ORDER — ARTIFICIAL TEARS OPHTHALMIC OINT
TOPICAL_OINTMENT | OPHTHALMIC | Status: DC | PRN
  Administered 2016-12-22: 1 via OPHTHALMIC

## 2016-12-22 MED ORDER — DEXAMETHASONE SODIUM PHOSPHATE 10 MG/ML IJ SOLN
INTRAMUSCULAR | Status: DC | PRN
  Administered 2016-12-22: 10 mg via INTRAVENOUS

## 2016-12-22 MED ORDER — ROCURONIUM BROMIDE 100 MG/10ML IV SOLN
INTRAVENOUS | Status: DC | PRN
  Administered 2016-12-22: 50 mg via INTRAVENOUS

## 2016-12-22 MED ORDER — LACTATED RINGERS IV SOLN
INTRAVENOUS | Status: DC
  Administered 2016-12-22: 11:00:00 via INTRAVENOUS

## 2016-12-22 MED ORDER — 0.9 % SODIUM CHLORIDE (POUR BTL) OPTIME
TOPICAL | Status: DC | PRN
  Administered 2016-12-22: 1000 mL

## 2016-12-22 MED ORDER — FENTANYL CITRATE (PF) 100 MCG/2ML IJ SOLN
25.0000 ug | INTRAMUSCULAR | Status: DC | PRN
  Administered 2016-12-22: 50 ug via INTRAVENOUS

## 2016-12-22 MED ORDER — FENTANYL CITRATE (PF) 100 MCG/2ML IJ SOLN
INTRAMUSCULAR | Status: DC | PRN
  Administered 2016-12-22: 50 ug via INTRAVENOUS
  Administered 2016-12-22 (×2): 100 ug via INTRAVENOUS

## 2016-12-22 MED ORDER — LIDOCAINE 2% (20 MG/ML) 5 ML SYRINGE
INTRAMUSCULAR | Status: AC
  Filled 2016-12-22: qty 5

## 2016-12-22 MED ORDER — MIDAZOLAM HCL 2 MG/2ML IJ SOLN
INTRAMUSCULAR | Status: AC
  Filled 2016-12-22: qty 2

## 2016-12-22 MED ORDER — LIDOCAINE HCL (CARDIAC) 20 MG/ML IV SOLN
INTRAVENOUS | Status: DC | PRN
  Administered 2016-12-22: 50 mg via INTRAVENOUS

## 2016-12-22 MED ORDER — PROPOFOL 10 MG/ML IV BOLUS
INTRAVENOUS | Status: DC | PRN
  Administered 2016-12-22: 100 mg via INTRAVENOUS

## 2016-12-22 MED ORDER — OXYCODONE HCL 5 MG/5ML PO SOLN: 5.0000 mg | Freq: Once | ORAL | Status: AC | PRN

## 2016-12-22 MED ORDER — SUGAMMADEX SODIUM 200 MG/2ML IV SOLN
INTRAVENOUS | Status: AC
  Filled 2016-12-22: qty 2

## 2016-12-22 MED ORDER — IOPAMIDOL (ISOVUE-300) INJECTION 61%
INTRAVENOUS | Status: AC
  Filled 2016-12-22: qty 50

## 2016-12-22 MED ORDER — IOPAMIDOL (ISOVUE-300) INJECTION 61%
INTRAVENOUS | Status: DC | PRN
  Administered 2016-12-22: 23 mL

## 2016-12-22 MED ORDER — LACTATED RINGERS IV SOLN
INTRAVENOUS | Status: DC | PRN
  Administered 2016-12-22 (×2): via INTRAVENOUS

## 2016-12-22 MED ORDER — OXYCODONE HCL 5 MG PO TABS
5.0000 mg | ORAL_TABLET | Freq: Once | ORAL | Status: AC | PRN
  Administered 2016-12-22: 5 mg via ORAL

## 2016-12-22 SURGICAL SUPPLY — 37 items
APPLIER CLIP 5 13 M/L LIGAMAX5 (MISCELLANEOUS) ×2
BLADE CLIPPER SURG (BLADE) IMPLANT
CANISTER SUCT 3000ML PPV (MISCELLANEOUS) ×2 IMPLANT
CATH REDDICK CHOLANGI 4FR 50CM (CATHETERS) ×2 IMPLANT
CHLORAPREP W/TINT 26ML (MISCELLANEOUS) ×4 IMPLANT
CLIP APPLIE 5 13 M/L LIGAMAX5 (MISCELLANEOUS) ×1 IMPLANT
COVER MAYO STAND STRL (DRAPES) ×2 IMPLANT
COVER SURGICAL LIGHT HANDLE (MISCELLANEOUS) ×2 IMPLANT
DERMABOND ADVANCED (GAUZE/BANDAGES/DRESSINGS) ×1
DERMABOND ADVANCED .7 DNX12 (GAUZE/BANDAGES/DRESSINGS) ×1 IMPLANT
DRAPE C-ARM 42X72 X-RAY (DRAPES) ×2 IMPLANT
ELECT REM PT RETURN 9FT ADLT (ELECTROSURGICAL) ×2
ELECTRODE REM PT RTRN 9FT ADLT (ELECTROSURGICAL) ×1 IMPLANT
GLOVE BIO SURGEON STRL SZ7.5 (GLOVE) ×2 IMPLANT
GLOVE BIOGEL PI IND STRL 8 (GLOVE) ×1 IMPLANT
GLOVE BIOGEL PI INDICATOR 8 (GLOVE) ×1
GLOVE SURG SS PI 6.5 STRL IVOR (GLOVE) ×2 IMPLANT
GLOVE SURG SS PI 8.0 STRL IVOR (GLOVE) ×2 IMPLANT
GOWN STRL REUS W/ TWL LRG LVL3 (GOWN DISPOSABLE) ×3 IMPLANT
GOWN STRL REUS W/TWL LRG LVL3 (GOWN DISPOSABLE) ×3
IV CATH 14GX2 1/4 (CATHETERS) ×2 IMPLANT
KIT BASIN OR (CUSTOM PROCEDURE TRAY) ×2 IMPLANT
KIT ROOM TURNOVER OR (KITS) ×2 IMPLANT
NS IRRIG 1000ML POUR BTL (IV SOLUTION) ×2 IMPLANT
PAD ARMBOARD 7.5X6 YLW CONV (MISCELLANEOUS) ×2 IMPLANT
POUCH SPECIMEN RETRIEVAL 10MM (ENDOMECHANICALS) ×2 IMPLANT
SCISSORS LAP 5X35 DISP (ENDOMECHANICALS) ×2 IMPLANT
SET IRRIG TUBING LAPAROSCOPIC (IRRIGATION / IRRIGATOR) ×2 IMPLANT
SLEEVE ENDOPATH XCEL 5M (ENDOMECHANICALS) ×4 IMPLANT
SPECIMEN JAR SMALL (MISCELLANEOUS) ×2 IMPLANT
SUT MNCRL AB 4-0 PS2 18 (SUTURE) ×4 IMPLANT
TOWEL OR 17X24 6PK STRL BLUE (TOWEL DISPOSABLE) ×2 IMPLANT
TOWEL OR 17X26 10 PK STRL BLUE (TOWEL DISPOSABLE) ×2 IMPLANT
TRAY LAPAROSCOPIC MC (CUSTOM PROCEDURE TRAY) ×2 IMPLANT
TROCAR XCEL BLUNT TIP 100MML (ENDOMECHANICALS) ×2 IMPLANT
TROCAR XCEL NON-BLD 5MMX100MML (ENDOMECHANICALS) ×2 IMPLANT
TUBING INSUFFLATION (TUBING) ×2 IMPLANT

## 2016-12-22 NOTE — Anesthesia Procedure Notes (Addendum)
Procedure Name: Intubation Date/Time: 12/22/2016 11:02 AM Performed by: Jacquiline Doe A Pre-anesthesia Checklist: Patient identified, Emergency Drugs available, Suction available and Patient being monitored Patient Re-evaluated:Patient Re-evaluated prior to inductionOxygen Delivery Method: Circle System Utilized and Circle system utilized Preoxygenation: Pre-oxygenation with 100% oxygen Intubation Type: IV induction and Cricoid Pressure applied Ventilation: Mask ventilation without difficulty and Oral airway inserted - appropriate to patient size Laryngoscope Size: Mac and 3 Grade View: Grade I Tube type: Oral Tube size: 7.0 mm Number of attempts: 1 Airway Equipment and Method: Stylet Placement Confirmation: ETT inserted through vocal cords under direct vision,  positive ETCO2 and breath sounds checked- equal and bilateral Secured at: 22 cm Tube secured with: Tape Dental Injury: Teeth and Oropharynx as per pre-operative assessment

## 2016-12-22 NOTE — Transfer of Care (Signed)
Immediate Anesthesia Transfer of Care Note  Patient: TESHARA MOREE  Procedure(s) Performed: Procedure(s): LAPAROSCOPIC CHOLECYSTECTOMY WITH INTRAOPERATIVE CHOLANGIOGRAM (N/A)  Patient Location: PACU  Anesthesia Type:General  Level of Consciousness: awake, oriented, sedated, patient cooperative and responds to stimulation  Airway & Oxygen Therapy: Patient Spontanous Breathing and Patient connected to nasal cannula oxygen  Post-op Assessment: Report given to RN, Post -op Vital signs reviewed and stable, Patient moving all extremities and Patient moving all extremities X 4  Post vital signs: Reviewed and stable  Last Vitals:  Vitals:   12/22/16 0507 12/22/16 1250  BP: (!) 136/51   Pulse: (!) 53   Resp: 18   Temp: 36.7 C 36.4 C    Last Pain:  Vitals:   12/22/16 1250  TempSrc:   PainSc: 8       Patients Stated Pain Goal: 4 (41/28/78 6767)  Complications: No apparent anesthesia complications

## 2016-12-22 NOTE — Anesthesia Postprocedure Evaluation (Signed)
Anesthesia Post Note  Patient: Carol [name]  Procedure(s) Performed: Procedure(s) (LRB): LAPAROSCOPIC CHOLECYSTECTOMY WITH INTRAOPERATIVE CHOLANGIOGRAM (N/A)  Patient location during evaluation: PACU Anesthesia Type: General Level of consciousness: awake and alert Pain management: pain level controlled Vital Signs Assessment: post-procedure vital signs reviewed and stable Respiratory status: spontaneous breathing, nonlabored ventilation, respiratory function stable and patient connected to nasal cannula oxygen Cardiovascular status: blood pressure returned to baseline and stable Postop Assessment: no signs of nausea or vomiting Anesthetic complications: no       Last Vitals:  Vitals:   12/22/16 1340 12/22/16 1414  BP:  (!) 173/62  Pulse:  (!) 55  Resp:  19  Temp: 36.4 C 36.3 C    Last Pain:  Vitals:   12/22/16 1624  TempSrc:   PainSc: 5                  [name]

## 2016-12-22 NOTE — Interval H&P Note (Signed)
History and Physical Interval Note:  12/22/2016 10:10 AM  Carol [name]  has presented today for surgery, with the diagnosis of Choledocholithiasi  The various methods of treatment have been discussed with the patient and family. After consideration of risks, benefits and other options for treatment, the patient has consented to  Procedure(s): LAPAROSCOPIC CHOLECYSTECTOMY WITH INTRAOPERATIVE CHOLANGIOGRAM (N/A) as a surgical intervention .  The patient's history has been reviewed, patient examined, no change in status, stable for surgery.  I have reviewed the patient's chart and labs.  Questions were answered to the patient's satisfaction.     TOTH III,PAUL S

## 2016-12-22 NOTE — Discharge Instructions (Signed)
Your appointment is at 10:00 AM on 01/18/17 , please arrive at least 30 min before your appointment to complete your check in paperwork.  If you are unable to arrive 30 min prior to your appointment time we may have to cancel or reschedule you.  LAPAROSCOPIC SURGERY: POST OP INSTRUCTIONS  1. DIET: Follow a light bland diet the first 24 hours after arrival home, such as soup, liquids, crackers, etc. Be sure to include lots of fluids daily. Avoid fast food or heavy meals as your are more likely to get nauseated. Eat a low fat the next few days after surgery.  2. Take your usually prescribed home medications unless otherwise directed. 3. PAIN CONTROL:  1. Pain is best controlled by a usual combination of three different methods TOGETHER:  1. Ice/Heat 2. Over the counter pain medication 3. Prescription pain medication 2. Most patients will experience some swelling and bruising around the incisions. Ice packs or heating pads (30-60 minutes up to 6 times a day) will help. Use ice for the first few days to help decrease swelling and bruising, then switch to heat to help relax tight/sore spots and speed recovery. Some people prefer to use ice alone, heat alone, alternating between ice & heat. Experiment to what works for you. Swelling and bruising can take several weeks to resolve.  3. It is helpful to take an over-the-counter pain medication regularly for the first few weeks. Choose one of the following that works best for you:  1. Naproxen (Aleve, etc) Two 220mg  tabs twice a day 2. Ibuprofen (Advil, etc) Three 200mg  tabs four times a day (every meal & bedtime) 3. Acetaminophen (Tylenol, etc) 500-650mg  four times a day (every meal & bedtime) 4. A prescription for pain medication (such as oxycodone, hydrocodone, etc) should be given to you upon discharge. Take your pain medication as prescribed.  1. If you are having problems/concerns with the prescription medicine (does not control pain, nausea, vomiting,  rash, itching, etc), please call us [phone] to see if we need to switch you to a different pain medicine that will work better for you and/or control your side effect better. 2. If you need a refill on your pain medication, please contact your pharmacy. They will contact our office to request authorization. Prescriptions will not be filled after 5 pm or on week-ends. 4. Avoid getting constipated. Between the surgery and the pain medications, it is common to experience some constipation. Increasing fluid intake and taking a fiber supplement (such as Metamucil, Citrucel, FiberCon, MiraLax, etc) 1-2 times a day regularly will usually help prevent this problem from occurring. A mild laxative (prune juice, Milk of Magnesia, MiraLax, etc) should be taken according to package directions if there are no bowel movements after 48 hours.  5. Watch out for diarrhea. If you have many loose bowel movements, simplify your diet to bland foods & liquids for a few days. Stop any stool softeners and decrease your fiber supplement. Switching to mild anti-diarrheal medications (Kayopectate, Pepto Bismol) can help. If this worsens or does not improve, please call us. 6. Wash / shower every day. You may shower over the dressings as they are waterproof. Continue to shower over incision(s) after the dressing is off. 7. Remove your waterproof bandages 5 days after surgery. You may leave the incision open to air. You may replace a dressing/Band-Aid to cover the incision for comfort if you wish.  8. ACTIVITIES as tolerated:  1. You may resume regular (light) daily activities beginning  the next day--such as daily self-care, walking, climbing stairs--gradually increasing activities as tolerated. If you can walk 30 minutes without difficulty, it is safe to try more intense activity such as jogging, treadmill, bicycling, low-impact aerobics, swimming, etc. 2. Save the most intensive and strenuous activity for last such as sit-ups,  heavy lifting, contact sports, etc Refrain from any heavy lifting or straining until you are off narcotics for pain control.  3. DO NOT PUSH THROUGH PAIN. Let pain be your guide: If it hurts to do something, don't do it. Pain is your body warning you to avoid that activity for another week until the pain goes down. 4. You may drive when you are no longer taking prescription pain medication, you can comfortably wear a seatbelt, and you can safely maneuver your car and apply brakes. 5. You may have sexual intercourse when it is comfortable.  9. FOLLOW UP in our office  1. Please call [facility] at [phone] to set up an appointment to see your surgeon in the office for a follow-up appointment approximately 2-3 weeks after your surgery. 2. Make sure that you call for this appointment the day you arrive home to insure a convenient appointment time.      10. IF YOU HAVE DISABILITY OR FAMILY LEAVE FORMS, BRING THEM TO THE               OFFICE FOR PROCESSING.   WHEN TO CALL us [phone]:  1. Poor pain control 2. Reactions / problems with new medications (rash/itching, nausea, etc)  3. Fever over 101.5 F (38.5 C) 4. Inability to urinate 5. Nausea and/or vomiting 6. Worsening swelling or bruising 7. Continued bleeding from incision. 8. Increased pain, redness, or drainage from the incision  The clinic staff is available to answer your questions during regular business hours (8:30am-5pm). Please dont hesitate to call and ask to speak to one of our nurses for clinical concerns.  If you have a medical emergency, go to the nearest emergency room or call 911.  A surgeon from [facility] is always on call at the [facility], [place], [place], [place], [place] [address] ?  MAIN: [phone] ? TOLL FREE: [phone] ?  FAX [phone]  [email or web address]    Laparoscopic Cholecystectomy, Care After This sheet gives you  information about how to care for yourself after your procedure. Your health care provider may also give you more specific instructions. If you have problems or questions, contact your health care provider. What can I expect after the procedure? After the procedure, it is common to have:  Pain at your incision sites. You will be given medicines to control this pain.  Mild nausea or vomiting.  Bloating and possible shoulder pain from the air-like gas that was used during the procedure. Follow these instructions at home: Incision care    Follow instructions from your health care provider about how to take care of your incisions. Make sure you:  Wash your hands with soap and water before you change your bandage (dressing). If soap and water are not available, use hand sanitizer.  Change your dressing as told by your health care provider.  Leave stitches (sutures), skin glue, or adhesive strips in place. These skin closures may need to be in place for 2 weeks or longer. If adhesive strip edges start to loosen and curl up, you may trim the loose edges. Do not remove adhesive strips completely unless  your health care provider tells you to do that.  Do not take baths, swim, or use a hot tub until your health care provider approves. Ask your health care provider if you can take showers. You may only be allowed to take sponge baths for bathing.  Check your incision area every day for signs of infection. Check for:  More redness, swelling, or pain.  More fluid or blood.  Warmth.  Pus or a bad smell. Activity   Do not drive or use heavy machinery while taking prescription pain medicine.  Do not lift anything that is heavier than 10 lb (4.5 kg) until your health care provider approves.  Do not play contact sports until your health care provider approves.  Do not drive for 24 hours if you were given a medicine to help you relax (sedative).  Rest as needed. Do not return to work or school  until your health care provider approves. General instructions   Take over-the-counter and prescription medicines only as told by your health care provider.  To prevent or treat constipation while you are taking prescription pain medicine, your health care provider may recommend that you:  Drink enough fluid to keep your urine clear or pale yellow.  Take over-the-counter or prescription medicines.  Eat foods that are high in fiber, such as fresh fruits and vegetables, whole grains, and beans.  Limit foods that are high in fat and processed sugars, such as fried and sweet foods. Contact a health care provider if:  You develop a rash.  You have more redness, swelling, or pain around your incisions.  You have more fluid or blood coming from your incisions.  Your incisions feel warm to the touch.  You have pus or a bad smell coming from your incisions.  You have a fever.  One or more of your incisions breaks open. Get help right away if:  You have trouble breathing.  You have chest pain.  You have increasing pain in your shoulders.  You faint or feel dizzy when you stand.  You have severe pain in your abdomen.  You have nausea or vomiting that lasts for more than one day.  You have leg pain. This information is not intended to replace advice given to you by your health care provider. Make sure you discuss any questions you have with your health care provider. Document Released: 08/02/2005 Document Revised: 02/21/2016 Document Reviewed: 01/19/2016 Elsevier Interactive Patient Education  2017 [employer].

## 2016-12-22 NOTE — Anesthesia Preprocedure Evaluation (Signed)
Anesthesia Evaluation  Patient identified by MRN, date of birth, ID band Patient awake    Reviewed: Allergy & Precautions, NPO status , Patient's Chart, lab work & pertinent test results, reviewed documented beta blocker date and time   History of Anesthesia Complications Negative for: history of anesthetic complications  Airway Mallampati: II  TM Distance: >3 FB Neck ROM: Full    Dental  (+) Edentulous Upper, Edentulous Lower   Pulmonary neg pulmonary ROS, Current Smoker, former smoker,    breath sounds clear to auscultation       Cardiovascular hypertension, Pt. on medications and Pt. on home beta blockers (-) angina+ CAD and + Past MI   Rhythm:Regular     Neuro/Psych  Headaches, PSYCHIATRIC DISORDERS Bipolar Disorder CVA    GI/Hepatic Neg liver ROS,   Endo/Other  Hypothyroidism   Renal/GU negative Renal ROS  negative genitourinary   Musculoskeletal negative musculoskeletal ROS (+)   Abdominal   Peds negative pediatric ROS (+)  Hematology negative hematology ROS (+)   Anesthesia Other Findings - HLD  Reproductive/Obstetrics negative OB ROS                             Anesthesia Physical Anesthesia Plan  ASA: III  Anesthesia Plan: General   Post-op Pain Management:    Induction: Intravenous  Airway Management Planned: Oral ETT  Additional Equipment: None  Intra-op Plan:   Post-operative Plan: Extubation in OR  Informed Consent: I have reviewed the patients History and Physical, chart, labs and discussed the procedure including the risks, benefits and alternatives for the proposed anesthesia with the patient or authorized representative who has indicated his/her understanding and acceptance.   Dental advisory given  Plan Discussed with: CRNA and Surgeon  Anesthesia Plan Comments:         Anesthesia Quick Evaluation

## 2016-12-22 NOTE — Progress Notes (Signed)
[facility] Progress Note  Patient: Carol [name] PZW:[id number]   PCP: Christie Nottingham, [place] DOB: [birth year]   DOA: 12/19/2016   DOS: 12/22/2016   Date of Service: the patient was seen and examined on 12/22/2016  Subjective: Denies any acute complaint, no abdominal pain. No nausea no vomiting. Seen prior to going for surgery  Brief hospital course: Pt. with PMH of CAD, recent CVA, HTN, bipolar, hypothyroidism, HLD; admitted on 12/19/2016, presented with complaint of nausea vomiting abdominal pain, was found to have acute cholecystitis with choledocholithiasis. SP ERCP. Currently further plan is monitor postoperative course after laparoscopic cholecystectomy.  Assessment and Plan: 1. Acute cholecystitis. Choledocholithiasis. Initially started on ceftriaxone, changed to IV Zosyn. [facility] was consulted, S/P ERCP with stone retrieval. Mild pancreatitis after ERCP currently resolved. Surgery was consulted, patient undergoing cholecystectomy today. We'll monitor postoperative course.  2. CAD. Recent CVA. Essential hypertension Patient on Plavix at home which is currently on hold which increases her risk for CVA Need to resume it as soreness possible from surgical point of view. Continue metoprolol and Imdur on hold at present. ACE inhibitor on hold at present  3. Hypothyroidism. Continue Synthroid.  4. Depression, bipolar disorder. Continue Celexa.  Diet: npo DVT Prophylaxis: subcutaneous Heparin  Advance goals of care discussion: FULL CODE  Family Communication: family was present at bedside, at the time of interview. The pt provided permission to discuss medical plan with the family. Opportunity was given to ask question and all questions were answered satisfactorily.   Disposition:  Discharge to home.  Consultants: gastroenterology general surgery  Procedures: ERCP, lap chole  Antibiotics: Anti-infectives    Start     Dose/Rate Route Frequency Ordered Stop   12/22/16 1100   piperacillin-tazobactam (ZOSYN) IVPB 3.375 g     3.375 g 100 mL/hr over 30 Minutes Intravenous To Surgery 12/22/16 1100 12/22/16 1130   12/20/16 0930  piperacillin-tazobactam (ZOSYN) IVPB 3.375 g     3.375 g 12.5 mL/hr over 240 Minutes Intravenous Every 8 hours 12/20/16 0910     12/19/16 1500  ciprofloxacin (CIPRO) IVPB 400 mg  Status:  Discontinued     400 mg 200 mL/hr over 60 Minutes Intravenous Every 12 hours 12/19/16 1533 12/20/16 1019   12/19/16 1000  cefTRIAXone (ROCEPHIN) 1 g in dextrose 5 % 50 mL IVPB  Status:  Discontinued     1 g 100 mL/hr over 30 Minutes Intravenous Every 24 hours 12/19/16 0905 12/20/16 0849       Objective: Physical Exam: Vitals:   12/22/16 1305 12/22/16 1325 12/22/16 1340 12/22/16 1414  BP: (!) 162/59 (!) 154/68  (!) 173/62  Pulse: (!) 54 (!) 48  (!) 55  Resp: 14 13  19   Temp:   97.5 F (36.4 C) 97.3 F (36.3 C)  TempSrc:      SpO2: 100% 100%  100%  Weight:        Intake/Output Summary (Last 24 hours) at 12/22/16 1751 Last data filed at 12/22/16 1547  Gross per 24 hour  Intake          2120.83 ml  Output               10 ml  Net          2110.83 ml   Filed Weights   12/22/16 0500  Weight: 63.6 kg (140 lb 2 oz)   General: Alert, Awake and Oriented to Time, Place and Person. Appear in mild distress, affect appropriate Eyes: PERRL, Conjunctiva normal  ENT: Oral Mucosa clear moist. Neck: no JVD, no Abnormal Mass Or lumps Cardiovascular: S1 and S2 Present, no Murmur, Respiratory: Bilateral Air entry equal and Decreased, no use of accessory muscle, Clear to Auscultation, no Crackles, no wheezes Abdomen: Bowel Sound present, Soft and no tenderness Skin: no redness, no Rash, no induration Extremities: no Pedal edema, no calf tenderness Neurologic: Grossly no focal neuro deficit. Bilaterally Equal motor strength  Data Reviewed: CBC:  Recent Labs Lab 12/18/16 2055 12/20/16 0652 12/21/16 0532 12/22/16 0756  WBC 5.7 5.9 7.7 7.1  NEUTROABS   --   --   --  4.4  HGB 12.5 11.1* 11.4* 10.8*  HCT 37.5 35.6* 34.9* 35.4*  MCV 93.7 97.3 96.4 98.1  PLT 207 170 170 621   Basic Metabolic Panel:  Recent Labs Lab 12/18/16 2055 12/20/16 0652 12/21/16 0532 12/22/16 0819  NA 139 137 139 142  K 3.5 3.5 3.8 3.5  CL 107 108 108 109  CO2 24 23 24 26   GLUCOSE 98 105* 129* 78  BUN 9 7 6 7   CREATININE 0.57 0.72 0.74 0.80  CALCIUM 9.4 8.8* 9.2 9.1    Liver Function Tests:  Recent Labs Lab 12/18/16 2055 12/20/16 0652 12/21/16 0532 12/22/16 0819  AST 686* 152* 430* 157*  ALT 759* 373* 535* 375*  ALKPHOS 477* 368* 404* 357*  BILITOT 1.3* 0.6 1.6* 0.5  PROT 7.4 5.8* 6.0* 6.1*  ALBUMIN 4.2 3.2* 3.2* 3.4*    Recent Labs Lab 12/18/16 2055 12/21/16 0532 12/22/16 0819  LIPASE 45 740* 35   No results for input(s): AMMONIA in the last 168 hours. Coagulation Profile: No results for input(s): INR, PROTIME in the last 168 hours. Cardiac Enzymes:  Recent Labs Lab 12/18/16 2055  TROPONINI <0.03   BNP (last 3 results) No results for input(s): PROBNP in the last 8760 hours. CBG: No results for input(s): GLUCAP in the last 168 hours. Studies: Dg Cholangiogram Operative  Result Date: 12/22/2016 CLINICAL DATA:  Intraoperative cholangiogram during laparoscopic cholecystectomy. EXAM: INTRAOPERATIVE CHOLANGIOGRAM FLUOROSCOPY TIME:  40 seconds COMPARISON:  ERCP - 12/20/2016 FINDINGS: Intraoperative cholangiographic images of the right upper abdominal quadrant during laparoscopic cholecystectomy are provided for review. Surgical clips overlie the expected location of the gallbladder fossa. Contrast injection demonstrates selective cannulation of the central aspect of the cystic duct. There is passage of contrast through the central aspect of the cystic duct with filling of a non dilated common bile duct. There is passage of contrast though the CBD and into the descending portion of the duodenum. There is minimal reflux of injected contrast  into the common hepatic duct and central aspect of the non dilated intrahepatic biliary system. There is a persistent nonocclusive filling defect in the distal aspect of the CBD. A spinal stimulator pack overlies the right lower abdominal quadrant. IMPRESSION: Persistent nonocclusive filling defect within the distal aspect of the CBD which could be indicative of nonocclusive choledocholithiasis versus an air bubble. Correlation with the operative report is recommended. Electronically Signed   By: Sandi Mariscal M.D.   On: 12/22/2016 12:23    Scheduled Meds: . levothyroxine  100 mcg Oral QAC breakfast  . metoprolol tartrate  12.5 mg Oral BID  . mometasone-formoterol  2 puff Inhalation BID   Continuous Infusions: . sodium chloride 100 mL/hr at 12/22/16 1547  . lactated ringers 50 mL/hr at 12/22/16 1046  . piperacillin-tazobactam (ZOSYN)  IV 3.375 g (12/22/16 1651)   PRN Meds: acetaminophen **OR** acetaminophen, albuterol, HYDROmorphone (DILAUDID) injection, ondansetron **  OR** ondansetron (ZOFRAN) IV  Time spent: 30 minutes  Author: Berle Mull, MD [facility] Pager: [phone] 12/22/2016 5:51 PM  If 7PM-7AM, please contact night-coverage at [email or web address], password [facility]

## 2016-12-22 NOTE — H&P (View-Only) (Signed)
1 Day Post-Op   Subjective/Chief Complaint: Abdominal pain is worse than yesterday   Objective: Vital signs in last 24 hours: Temp:  [97.8 F (36.6 C)-98 F (36.7 C)] 98 F (36.7 C) (05/08 0647) Pulse Rate:  [54-89] 55 (05/08 0647) Resp:  [17-31] 20 (05/08 0647) BP: (136-176)/(43-74) 136/44 (05/08 0647) SpO2:  [91 %-100 %] 99 % (05/08 0647) Last BM Date: 12/18/16  Intake/Output from previous day: 05/07 0701 - 05/08 0700 In: 2443.8 [P.O.:340; I.V.:2103.8] Out: 1 [Blood:1] Intake/Output this shift: No intake/output data recorded.  General appearance: alert and cooperative Resp: clear to auscultation bilaterally Cardio: regular rate and rhythm GI: soft, moderate central tenderness  Lab Results:   Recent Labs  12/20/16 0652 12/21/16 0532  WBC 5.9 7.7  HGB 11.1* 11.4*  HCT 35.6* 34.9*  PLT 170 170   BMET  Recent Labs  12/20/16 0652 12/21/16 0532  NA 137 139  K 3.5 3.8  CL 108 108  CO2 23 24  GLUCOSE 105* 129*  BUN 7 6  CREATININE 0.72 0.74  CALCIUM 8.8* 9.2   PT/INR No results for input(s): LABPROT, INR in the last 72 hours. ABG No results for input(s): PHART, HCO3 in the last 72 hours.  Invalid input(s): PCO2, PO2  Studies/Results: Dg Ercp  Result Date: 12/20/2016 CLINICAL DATA:  CHOLELITHIASIS. CHOLEDOCHOLITHIASIS DEMONSTRATED BY ULTRASOUND. EXAM: ERCP with sphincterotomy and balloon sweep for stone removal TECHNIQUE: Multiple spot images obtained with the fluoroscopic device and submitted for interpretation post-procedure. FLUOROSCOPY TIME:  Fluoroscopy Time:  1 minutes 35 seconds COMPARISON:  12/19/2016 FINDINGS: Four spot fluoroscopic intraoperative views demonstrate retrograde cholangiogram. Biliary tree is normal in caliber without obstruction. Contrast refluxes into the gallbladder demonstrating cholelithiasis. Small distal CBD filling defects noted compatible with nonobstructing choledocholithiasis. Sphincterotomy and balloon sweep performed for  removal. Please refer to the operative note for further details. IMPRESSION: Evidence of choledocholithiasis, status post balloon extraction Cholelithiasis No biliary obstruction These images were submitted for radiologic interpretation only. Please see the procedural report for the amount of contrast and the fluoroscopy time utilized. Electronically Signed   By: Jerilynn Mages.  Shick M.D.   On: 12/20/2016 13:33    Anti-infectives: Anti-infectives    Start     Dose/Rate Route Frequency Ordered Stop   12/20/16 0930  piperacillin-tazobactam (ZOSYN) IVPB 3.375 g     3.375 g 12.5 mL/hr over 240 Minutes Intravenous Every 8 hours 12/20/16 0910     12/19/16 1500  ciprofloxacin (CIPRO) IVPB 400 mg  Status:  Discontinued     400 mg 200 mL/hr over 60 Minutes Intravenous Every 12 hours 12/19/16 1533 12/20/16 1019   12/19/16 1000  cefTRIAXone (ROCEPHIN) 1 g in dextrose 5 % 50 mL IVPB  Status:  Discontinued     1 g 100 mL/hr over 30 Minutes Intravenous Every 24 hours 12/19/16 0905 12/20/16 0849      Assessment/Plan: s/p Procedure(s): ENDOSCOPIC RETROGRADE CHOLANGIOPANCREATOGRAPHY (ERCP) (N/A) She appears to have post ERCP pancreatits. will continue bowel rest and let this resolve before removing gallbadder. May have ice chips for now  LOS: 2 days    TOTH III,PAUL S 12/21/2016

## 2016-12-22 NOTE — Op Note (Addendum)
12/19/2016 - 12/22/2016  12:32 PM  PATIENT:  Carol [name]  61 y.o. female  PRE-OPERATIVE DIAGNOSIS:  gallstones  POST-OPERATIVE DIAGNOSIS: gallstones  PROCEDURE:  Procedure(s): LAPAROSCOPIC CHOLECYSTECTOMY WITH INTRAOPERATIVE CHOLANGIOGRAM (N/A)  SURGEON:  Surgeon(s) and Role:    * Jovita Kussmaul, MD - Primary  PHYSICIAN ASSISTANT:   ASSISTANTS: Brigid Re, PA   ANESTHESIA:   local and general  EBL:  Total I/O In: 1050 [I.V.:1050] Out: 10 [Blood:10]  BLOOD ADMINISTERED:none  DRAINS: none   LOCAL MEDICATIONS USED:  MARCAINE     SPECIMEN:  Source of Specimen:  gallbladder  DISPOSITION OF SPECIMEN:  PATHOLOGY  COUNTS:  YES  TOURNIQUET:  * No tourniquets in log *  DICTATION: .Dragon Dictation   Procedure: After informed consent was obtained the patient was brought to the operating room and placed in the supine position on the operating room table. After adequate induction of general anesthesia the patient's abdomen was prepped with ChloraPrep allowed to dry and draped in usual sterile manner. An appropriate timeout was performed.The area below the umbilicus was infiltrated with quarter percent  Marcaine. A small incision was made with a 15 blade knife. The incision was carried down through the subcutaneous tissue bluntly with a hemostat and [employer] retractors. The linea alba was identified. The linea alba was incised with a 15 blade knife and each side was grasped with Coker clamps. The preperitoneal space was then probed with a hemostat until the peritoneum was opened and access was gained to the abdominal cavity. A 0 Vicryl pursestring stitch was placed in the fascia surrounding the opening. A Hassan cannula was then placed through the opening and anchored in place with the previously placed Vicryl purse string stitch. The abdomen was insufflated with carbon dioxide without difficulty. A laparoscope was inserted through the [facility] cannula in the right upper quadrant was  inspected. Next the epigastric region was infiltrated with % Marcaine. A small incision was made with a 15 blade knife. A 5 mm port was placed bluntly through this incision into the abdominal cavity under direct vision. Next 2 sites were chosen laterally on the right side of the abdomen for placement of 5 mm ports. Each of these areas was infiltrated with quarter percent Marcaine. Small stab incisions were made with a 15 blade knife. 5 mm ports were then placed bluntly through these incisions into the abdominal cavity under direct vision without difficulty. A blunt grasper was placed through the lateralmost 5 mm port and used to grasp the dome of the gallbladder and elevated anteriorly and superiorly. Another blunt grasper was placed through the other 5 mm port and used to retract the body and neck of the gallbladder. A dissector was placed through the epigastric port and using the electrocautery the peritoneal reflection at the gallbladder neck was opened. Blunt dissection was then carried out in this area until the gallbladder neck-cystic duct junction was readily identified and a good window was created. A single clip was placed on the gallbladder neck. A small  ductotomy was made just below the clip with laparoscopic scissors. A 14-gauge Angiocath was then placed through the anterior abdominal wall under direct vision. A Reddick cholangiogram catheter was then placed through the Angiocath and flushed. The catheter was then placed in the cystic duct and anchored in place with a clip. A cholangiogram was obtained that showed a filling defect. I was able to pass the cholangiogram catheter and subsequent cholangiogram showed no filling defect and good emptying into the  duodenum.  The anchoring clip and catheters were then removed from the patient. 3 clips were placed proximally on the cystic duct and the duct was divided between the 2 sets of clips. Posterior to this the cystic artery was identified and again  dissected bluntly in a circumferential manner until a good window  was created. 2 clips were placed proximally and one distally on the artery and the artery was divided between the 2 sets of clips. Next a laparoscopic hook cautery device was used to separate the gallbladder from the liver bed. Prior to completely detaching the gallbladder from the liver bed the liver bed was inspected and several small bleeding points were coagulated with the electrocautery until the area was completely hemostatic. The gallbladder was then detached the rest of it from the liver bed without difficulty. A laparoscopic bag was inserted through the hassan port. The laparoscope was moved to the epigastric port. The gallbladder was placed within the bag and the bag was sealed.  The bag with the gallbladder was then removed with the [facility] cannula through the infraumbilical port without difficulty. The fascial defect was then closed with the previously placed Vicryl pursestring stitch as well as with another figure-of-eight 0 Vicryl stitch. The liver bed was inspected again and found to be hemostatic. The abdomen was irrigated with copious amounts of saline until the effluent was clear. The ports were then removed under direct vision without difficulty and were found to be hemostatic. The gas was allowed to escape. The skin incisions were all closed with interrupted 4-0 Monocryl subcuticular stitches. Dermabond dressings were applied. The patient tolerated the procedure well. At the end of the case all needle sponge and instrument counts were correct. The patient was then awakened and taken to recovery in stable condition. The assistant was very valuable in helping with retraction and visualization during the case.  PLAN OF CARE: Admit to inpatient   PATIENT DISPOSITION:  PACU - hemodynamically stable.   Delay start of Pharmacological VTE agent (>24hrs) due to surgical blood loss or risk of bleeding: no

## 2016-12-23 ENCOUNTER — Encounter (HOSPITAL_COMMUNITY): Payer: Self-pay | Admitting: General Surgery

## 2016-12-23 LAB — COMPREHENSIVE METABOLIC PANEL
ALK PHOS: 309 U/L — AB (ref 38–126)
ALT: 260 U/L — ABNORMAL HIGH (ref 14–54)
AST: 77 U/L — ABNORMAL HIGH (ref 15–41)
Albumin: 3 g/dL — ABNORMAL LOW (ref 3.5–5.0)
Anion gap: 9 (ref 5–15)
BUN: 7 mg/dL (ref 6–20)
CALCIUM: 9 mg/dL (ref 8.9–10.3)
CO2: 27 mmol/L (ref 22–32)
Chloride: 106 mmol/L (ref 101–111)
Creatinine, Ser: 0.78 mg/dL (ref 0.44–1.00)
Glucose, Bld: 102 mg/dL — ABNORMAL HIGH (ref 65–99)
Potassium: 4.4 mmol/L (ref 3.5–5.1)
SODIUM: 142 mmol/L (ref 135–145)
TOTAL PROTEIN: 5.5 g/dL — AB (ref 6.5–8.1)
Total Bilirubin: 0.6 mg/dL (ref 0.3–1.2)

## 2016-12-23 LAB — CBC WITH DIFFERENTIAL/PLATELET
Basophils Absolute: 0 10*3/uL (ref 0.0–0.1)
Basophils Relative: 0 %
EOS ABS: 0 10*3/uL (ref 0.0–0.7)
EOS PCT: 0 %
HCT: 33.9 % — ABNORMAL LOW (ref 36.0–46.0)
HEMOGLOBIN: 10.8 g/dL — AB (ref 12.0–15.0)
LYMPHS ABS: 1.3 10*3/uL (ref 0.7–4.0)
Lymphocytes Relative: 17 %
MCH: 30.8 pg (ref 26.0–34.0)
MCHC: 31.9 g/dL (ref 30.0–36.0)
MCV: 96.6 fL (ref 78.0–100.0)
MONOS PCT: 9 %
Monocytes Absolute: 0.7 10*3/uL (ref 0.1–1.0)
NEUTROS PCT: 74 %
Neutro Abs: 5.8 10*3/uL (ref 1.7–7.7)
Platelets: 168 10*3/uL (ref 150–400)
RBC: 3.51 MIL/uL — ABNORMAL LOW (ref 3.87–5.11)
RDW: 12.6 % (ref 11.5–15.5)
WBC: 7.8 10*3/uL (ref 4.0–10.5)

## 2016-12-23 LAB — MAGNESIUM: MAGNESIUM: 1.9 mg/dL (ref 1.7–2.4)

## 2016-12-23 MED ORDER — AMOXICILLIN-POT CLAVULANATE 500-125 MG PO TABS
1.0000 | ORAL_TABLET | Freq: Three times a day (TID) | ORAL | Status: DC
  Administered 2016-12-23: 500 mg via ORAL
  Filled 2016-12-23 (×2): qty 1

## 2016-12-23 MED ORDER — AMOXICILLIN-POT CLAVULANATE 500-125 MG PO TABS: 1.0000 | ORAL_TABLET | Freq: Three times a day (TID) | ORAL | 0 refills | Status: AC

## 2016-12-23 MED ORDER — ACETAMINOPHEN 650 MG RE SUPP: 650.0000 mg | Freq: Four times a day (QID) | RECTAL | Status: DC

## 2016-12-23 MED ORDER — AMOXICILLIN-POT CLAVULANATE 500-125 MG PO TABS
1.0000 | ORAL_TABLET | Freq: Three times a day (TID) | ORAL | Status: DC
  Filled 2016-12-23: qty 1

## 2016-12-23 MED ORDER — HYDROCODONE-ACETAMINOPHEN 5-325 MG PO TABS: 1.0000 | ORAL_TABLET | Freq: Four times a day (QID) | ORAL | 0 refills | Status: DC | PRN

## 2016-12-23 MED ORDER — LISINOPRIL 5 MG PO TABS
5.0000 mg | ORAL_TABLET | Freq: Every day | ORAL | Status: DC
  Administered 2016-12-23: 5 mg via ORAL
  Filled 2016-12-23: qty 1

## 2016-12-23 MED ORDER — SACCHAROMYCES BOULARDII 250 MG PO CAPS
250.0000 mg | ORAL_CAPSULE | Freq: Two times a day (BID) | ORAL | Status: DC
  Administered 2016-12-23: 250 mg via ORAL
  Filled 2016-12-23: qty 1

## 2016-12-23 MED ORDER — CLOPIDOGREL BISULFATE 75 MG PO TABS
75.0000 mg | ORAL_TABLET | Freq: Every day | ORAL | Status: DC
  Administered 2016-12-23: 75 mg via ORAL
  Filled 2016-12-23: qty 1

## 2016-12-23 MED ORDER — ACETAMINOPHEN 325 MG PO TABS
650.0000 mg | ORAL_TABLET | Freq: Four times a day (QID) | ORAL | Status: DC
  Administered 2016-12-23 (×2): 650 mg via ORAL
  Filled 2016-12-23 (×2): qty 2

## 2016-12-23 MED ORDER — HYDROCODONE-ACETAMINOPHEN 5-325 MG PO TABS: 1.0000 | ORAL_TABLET | Freq: Four times a day (QID) | ORAL | Status: DC | PRN

## 2016-12-23 MED ORDER — METHOCARBAMOL 500 MG PO TABS
500.0000 mg | ORAL_TABLET | Freq: Three times a day (TID) | ORAL | Status: DC
  Administered 2016-12-23 (×2): 500 mg via ORAL
  Filled 2016-12-23 (×2): qty 1

## 2016-12-23 MED ORDER — METHOCARBAMOL 500 MG PO TABS: 500.0000 mg | ORAL_TABLET | Freq: Three times a day (TID) | ORAL | 0 refills | Status: DC

## 2016-12-23 MED ORDER — SACCHAROMYCES BOULARDII 250 MG PO CAPS: 250.0000 mg | ORAL_CAPSULE | Freq: Two times a day (BID) | ORAL | 0 refills | Status: DC

## 2016-12-23 NOTE — Progress Notes (Signed)
Patient tolerated lunch and pain is improved.  Follow up provided. Stable for discharge to home today.  Claiborne Billings Rayburn PA-C 12/23/16 2:07 PM

## 2016-12-23 NOTE — Progress Notes (Signed)
Baltazar Apo to be D/C'd Home per MD order.  Discussed with the patient and all questions fully answered.  VSS, Skin clean, dry and intact without evidence of skin break down, no evidence of skin tears noted. IV catheter discontinued intact. Site without signs and symptoms of complications. Dressing and pressure applied.  An After Visit Summary was printed and given to the patient. Patient received prescription.  D/c education completed with patient/family including follow up instructions, medication list, d/c activities limitations if indicated, with other d/c instructions as indicated by MD - patient able to verbalize understanding, all questions fully answered.   Patient instructed to return to ED, call 911, or call MD for any changes in condition.   Patient escorted via [place], and D/C home via private auto.  Luci Bank 12/23/2016 3:22 PM

## 2016-12-23 NOTE — Progress Notes (Signed)
[facility] Progress Note  1 Day Post-Op  Subjective: CC: Abdominal soreness Patient is POD#1. Ambulating well, UOP good. Patient has not had solid food yet, but has had 1 watery BM and is passing flatus. Patient says abdomen is sore.   Objective: Vital signs in last 24 hours: Temp:  [97.3 F (36.3 C)-98.5 F (36.9 C)] 98.5 F (36.9 C) (05/10 0528) Pulse Rate:  [48-74] 59 (05/10 0528) Resp:  [13-19] 18 (05/10 0528) BP: (129-173)/(45-68) 129/45 (05/10 0528) SpO2:  [94 %-100 %] 97 % (05/10 0528) Weight:  [66.2 kg (145 lb 14.4 oz)] 66.2 kg (145 lb 14.4 oz) (05/10 0528) Last BM Date: 12/18/16  Intake/Output from previous day: 05/09 0701 - 05/10 0700 In: 2120.8 [P.O.:120; I.V.:1900.8; IV Piggyback:100] Out: 960 [Urine:950; Blood:10] Intake/Output this shift: No intake/output data recorded.  PE: Gen:  Alert, NAD, pleasant Pulm:  Normal effort, clear to auscultation bilaterally Abd: Soft, Mildly TTP on palpation, non-distended, bowel sounds present in all 4 quadrants, no HSM, incisions C/D/I Skin: warm and dry, no rashes  Psych: A&Ox3   Lab Results:   Recent Labs  12/22/16 0756 12/23/16 0416  WBC 7.1 7.8  HGB 10.8* 10.8*  HCT 35.4* 33.9*  PLT 174 168   BMET  Recent Labs  12/22/16 0819 12/23/16 0416  NA 142 142  K 3.5 4.4  CL 109 106  CO2 26 27  GLUCOSE 78 102*  BUN 7 7  CREATININE 0.80 0.78  CALCIUM 9.1 9.0   PT/INR No results for input(s): LABPROT, INR in the last 72 hours. CMP     Component Value Date/Time   NA 142 12/23/2016 0416   NA 140 03/15/2014 1011   K 4.4 12/23/2016 0416   K 4.0 03/15/2014 1011   CL 106 12/23/2016 0416   CL 109 (H) 03/15/2014 1011   CO2 27 12/23/2016 0416   CO2 29 03/15/2014 1011   GLUCOSE 102 (H) 12/23/2016 0416   GLUCOSE 84 03/15/2014 1011   BUN 7 12/23/2016 0416   BUN 8 03/15/2014 1011   CREATININE 0.78 12/23/2016 0416   CREATININE 1.01 03/15/2014 1011   CALCIUM 9.0 12/23/2016 0416   CALCIUM 9.2  03/15/2014 1011   PROT 5.5 (L) 12/23/2016 0416   PROT 7.0 03/15/2014 1011   ALBUMIN 3.0 (L) 12/23/2016 0416   ALBUMIN 3.4 03/15/2014 1011   AST 77 (H) 12/23/2016 0416   AST 21 03/15/2014 1011   ALT 260 (H) 12/23/2016 0416   ALT 20 03/15/2014 1011   ALKPHOS 309 (H) 12/23/2016 0416   ALKPHOS 150 (H) 03/15/2014 1011   BILITOT 0.6 12/23/2016 0416   BILITOT 0.6 03/15/2014 1011   GFRNONAA >60 12/23/2016 0416   GFRNONAA >60 03/15/2014 1011   GFRAA >60 12/23/2016 0416   GFRAA >60 03/15/2014 1011   Lipase     Component Value Date/Time   LIPASE 35 12/22/2016 0819   LIPASE 239 11/10/2013 1845     Studies/Results: Dg Cholangiogram Operative  Result Date: 12/22/2016 CLINICAL DATA:  Intraoperative cholangiogram during laparoscopic cholecystectomy. EXAM: INTRAOPERATIVE CHOLANGIOGRAM FLUOROSCOPY TIME:  40 seconds COMPARISON:  ERCP - 12/20/2016 FINDINGS: Intraoperative cholangiographic images of the right upper abdominal quadrant during laparoscopic cholecystectomy are provided for review. Surgical clips overlie the expected location of the gallbladder fossa. Contrast injection demonstrates selective cannulation of the central aspect of the cystic duct. There is passage of contrast through the central aspect of the cystic duct with filling of a non dilated common bile duct. There is passage of contrast  though the CBD and into the descending portion of the duodenum. There is minimal reflux of injected contrast into the common hepatic duct and central aspect of the non dilated intrahepatic biliary system. There is a persistent nonocclusive filling defect in the distal aspect of the CBD. A spinal stimulator pack overlies the right lower abdominal quadrant. IMPRESSION: Persistent nonocclusive filling defect within the distal aspect of the CBD which could be indicative of nonocclusive choledocholithiasis versus an air bubble. Correlation with the operative report is recommended. Electronically Signed   By:  Sandi Mariscal M.D.   On: 12/22/2016 12:23    Anti-infectives: Anti-infectives    Start     Dose/Rate Route Frequency Ordered Stop   12/22/16 1100  piperacillin-tazobactam (ZOSYN) IVPB 3.375 g     3.375 g 100 mL/hr over 30 Minutes Intravenous To Surgery 12/22/16 1100 12/22/16 1130   12/20/16 0930  piperacillin-tazobactam (ZOSYN) IVPB 3.375 g     3.375 g 12.5 mL/hr over 240 Minutes Intravenous Every 8 hours 12/20/16 0910     12/19/16 1500  ciprofloxacin (CIPRO) IVPB 400 mg  Status:  Discontinued     400 mg 200 mL/hr over 60 Minutes Intravenous Every 12 hours 12/19/16 1533 12/20/16 1019   12/19/16 1000  cefTRIAXone (ROCEPHIN) 1 g in dextrose 5 % 50 mL IVPB  Status:  Discontinued     1 g 100 mL/hr over 30 Minutes Intravenous Every 24 hours 12/19/16 0905 12/20/16 0849       Assessment/Plan  S/P laparoscopic cholecystectomy with cholangiogram  POD#1 - Advance diet as tolerated - schedule tylenol q6, PRN Oxy IR 5 mg if needed for pain - H/H stable, restart home plavix - restart home BP meds, D/C metoprolol - possibly home this afternoon if tolerating regular diet and pain is well controlled   LOS: 4 days    Brigid Re , [facility] 12/23/2016, 7:57 AM Pager: [phone] Consults: [phone] Mon-Fri 7:00 am-4:30 pm Sat-Sun 7:00 am-11:30 am

## 2016-12-27 DIAGNOSIS — M797 Fibromyalgia: Secondary | ICD-10-CM | POA: Diagnosis not present

## 2016-12-27 DIAGNOSIS — M1991 Primary osteoarthritis, unspecified site: Secondary | ICD-10-CM | POA: Diagnosis not present

## 2016-12-27 DIAGNOSIS — Z7902 Long term (current) use of antithrombotics/antiplatelets: Secondary | ICD-10-CM | POA: Diagnosis not present

## 2016-12-27 DIAGNOSIS — I10 Essential (primary) hypertension: Secondary | ICD-10-CM | POA: Diagnosis not present

## 2016-12-27 DIAGNOSIS — I69351 Hemiplegia and hemiparesis following cerebral infarction affecting right dominant side: Secondary | ICD-10-CM | POA: Diagnosis not present

## 2016-12-27 DIAGNOSIS — I2510 Atherosclerotic heart disease of native coronary artery without angina pectoris: Secondary | ICD-10-CM | POA: Diagnosis not present

## 2016-12-27 DIAGNOSIS — G6189 Other inflammatory polyneuropathies: Secondary | ICD-10-CM | POA: Diagnosis not present

## 2016-12-27 NOTE — Discharge Summary (Addendum)
[facility] Discharge Summary   Patient: Carol [name] UKG:[id number]   PCP: Christie Nottingham, [place] DOB: 06-14-1956   Date of admission: 12/19/2016   Date of discharge: 12/23/2016    Discharge Diagnoses:  Principal Problem:   Choledocholithiasis with acute cholecystitis Active Problems:   Bipolar disorder ([place])   Coronary artery disease   Essential hypertension   Hypothyroidism   History of stroke   Admitted From: home Disposition:  Home   Recommendations for Outpatient Follow-up:  1. Please follow up with PCP in 1 week and general surgery in 1-2 week   Follow-up Information    [facility] Follow up.   Specialty:  General Surgery Why:  Your appointment is on 01/18/17 at 10:00 AM, be there 30 min early for check-in. Bring Photo ID and insurance information.  Contact information: [address] [place] [address] [phone]        Christie Nottingham, [place]. Schedule an appointment as soon as possible for a visit in 1 week(s).   Specialty:  Physician Assistant Contact information: [place] [address] [phone]          Diet recommendation: soft diet  Activity: The patient is advised to gradually reintroduce usual activities.  Discharge Condition: good  Code Status: full code  History of present illness: As per the H and P dictated on admission, "LYDIAN [name] is a 61 y.o. female with medical history significant of coronary artery disease with myocardial infarction and left heart catheterization 7 years ago, stroke in April 2018, hypertension, bipolar disorder, hyperlipidemia, hypothyroidism who presents emergency department at [facility] with complaints of abdominal pain. Patient's symptoms began a few evenings ago when she developed short sharp right upper quadrant pain which woke her up after sleep at approximately 3 AM. Her husband checked her blood pressure she said it didn't feel like a heart pain and  eventually the pain dissipated but she was sore for the rest of the day. On Saturday evening after dinner she developed severe right upper quadrant pain which radiated to her back her dinner was a bologna sandwich. She had associated nausea and vomiting but no fever her blood pressure was elevated at that time. Other symptoms include green stools but not diarrhea. Her pain was rated as a 5-6 out of 10. She therefore came into the emergency department for further evaluation."  Hospital Course:  Summary of her active problems in the hospital is as following. 1. Acute cholecystitis. Choledocholithiasis. Initially started on ceftriaxone, changed to IV Zosyn. [facility] was consulted, S/P ERCP with stone retrieval. Mild pancreatitis after ERCP currently resolved. Surgery was consulted, patient underwent cholecystectomy. Tolerated well and pain was well controlled on oral regimen, she tolerated diet and was able to ambulate without any complains.  2. CAD. Recent CVA. Essential hypertension Patient on Plavix at home which was on hold which increases her risk for CVA Continue metoprolol and Imdur  3. Hypothyroidism. Continue Synthroid.  4. Depression, bipolar disorder. Continue Celexa.  All other chronic medical condition were stable during the hospitalization.  Patient was ambulatory without any assistance. On the day of the discharge the patient's vitals were stable, and no other acute medical condition were reported by patient. the patient was felt safe to be discharge at home  with family.  Procedures and Results:  ERCP  Laparoscopic cholecystectomy    Consultations:  Gastroenterology   General surgery   DISCHARGE MEDICATION: Discharge Medication List as  of 12/23/2016  3:20 PM    START taking these medications   Details  amoxicillin-clavulanate (AUGMENTIN) 500-125 MG tablet Take 1 tablet (500 mg total) by mouth 3 (three) times daily., Starting Thu 12/23/2016, Until Sun  12/26/2016, Normal    methocarbamol (ROBAXIN) 500 MG tablet Take 1 tablet (500 mg total) by mouth 3 (three) times daily., Starting Thu 12/23/2016, Normal    saccharomyces boulardii (FLORASTOR) 250 MG capsule Take 1 capsule (250 mg total) by mouth 2 (two) times daily., Starting Thu 12/23/2016, Normal      CONTINUE these medications which have NOT CHANGED   Details  albuterol (PROVENTIL HFA;VENTOLIN HFA) 108 (90 BASE) MCG/ACT inhaler Inhale 2 puffs into the lungs every 6 (six) hours as needed for wheezing or shortness of breath., Historical Med    albuterol (PROVENTIL) (2.5 MG/3ML) 0.083% nebulizer solution Take 2.5 mg by nebulization every 6 (six) hours as needed for wheezing or shortness of breath., Historical Med    atorvastatin (LIPITOR) 80 MG tablet Take 80 mg by mouth daily., Starting Thu 11/18/2016, Historical Med    cetirizine (ZYRTEC) 10 MG tablet Take 10 mg by mouth daily as needed. , Historical Med    citalopram (CELEXA) 40 MG tablet Take 40 mg by mouth daily., Historical Med    clopidogrel (PLAVIX) 75 MG tablet Take 75 mg by mouth daily. , Historical Med    ferrous sulfate 325 (65 FE) MG tablet Take 325 mg by mouth every 7 (seven) days. Pt takes on Saturday., Historical Med    isosorbide mononitrate (IMDUR) 30 MG 24 hr tablet Take 30 mg by mouth daily., Historical Med    levothyroxine (SYNTHROID, LEVOTHROID) 100 MCG tablet Take 100 mcg by mouth daily., Historical Med    lidocaine (LIDODERM) 5 % Place 1 patch onto the skin every 12 (twelve) hours. Remove & Discard patch within 12 hours or as directed by MD, Starting Sun 08/29/2016, Until Mon 08/29/2017, Print    lisinopril (PRINIVIL,ZESTRIL) 5 MG tablet Take 5 mg by mouth daily., Historical Med    lubiprostone (AMITIZA) 8 MCG capsule Take 8 mcg by mouth 2 (two) times daily as needed for constipation., Historical Med    metoprolol tartrate (LOPRESSOR) 25 MG tablet Take 25 mg by mouth 2 (two) times daily., Until Discontinued,  Historical Med    HYDROcodone-acetaminophen (NORCO) 5-325 MG tablet Take 1 tablet by mouth every 6 (six) hours as needed for severe pain., Starting Sun 08/29/2016, Print      STOP taking these medications     budesonide-formoterol (SYMBICORT) 160-4.5 MCG/ACT inhaler      traMADol (ULTRAM) 50 MG tablet        Allergies  Allergen Reactions  . Morphine Other (See Comments)    Reaction:  Hallucinations   . Pregabalin Other (See Comments)    Pt states that she was told not to take this medication.    . Rofecoxib Other (See Comments)    Reaction:  Burning of stomach   . Sulfamethoxazole-Trimethoprim Swelling    Reaction:  Unknown   . Asa [Aspirin] Hives, Nausea Only and Other (See Comments)    Reaction:  Burning of stomach    Discharge Instructions    Diet - low sodium heart healthy    Complete by:  As directed    Discharge instructions    Complete by:  As directed    It is important that you read following instructions as well as go over your medication list with RN to help you understand your  care after this hospitalization.  Discharge Instructions: Please follow-up with PCP in one week  Please request your primary care physician to go over all Hospital Tests and Procedure/Radiological results at the follow up,  Please get all Hospital records sent to your PCP by signing hospital release before you go home.   Do not take more than prescribed Pain, Sleep and Anxiety Medications. You were cared for by a hospitalist during your hospital stay. If you have any questions about your discharge medications or the care you received while you were in the hospital after you are discharged, you can call the unit and ask to speak with the hospitalist on call if the hospitalist that took care of you is not available.  Once you are discharged, your primary care physician will handle any further medical issues. Please note that NO REFILLS for any discharge medications will be authorized once you  are discharged, as it is imperative that you return to your primary care physician (or establish a relationship with a primary care physician if you do not have one) for your aftercare needs so that they can reassess your need for medications and monitor your lab values. You Must read complete instructions/literature along with all the possible adverse reactions/side effects for all the Medicines you take and that have been prescribed to you. Take any new Medicines after you have completely understood and accept all the possible adverse reactions/side effects. Wear Seat belts while driving. If you have smoked or chewed Tobacco in the last 2 yrs please stop smoking and/or stop any Recreational drug use.   Increase activity slowly    Complete by:  As directed      Discharge Exam: Filed Weights   12/22/16 0500 12/23/16 0528  Weight: 63.6 kg (140 lb 2 oz) 66.2 kg (145 lb 14.4 oz)   Vitals:   12/23/16 0920 12/23/16 0927  BP:  (!) 152/53  Pulse: 60   Resp: 18   Temp:     General: Appear in mild distress, no Rash; Oral Mucosa moist. Cardiovascular: S1 and S2 Present, no Murmur, no JVD Respiratory: Bilateral Air entry present and Clear to Auscultation, no Crackles, no wheezes Abdomen: Bowel Sound present, Soft and mild tenderness Extremities: no Pedal edema, no calf tenderness Neurology: Grossly no focal neuro deficit.  The results of significant diagnostics from this hospitalization (including imaging, microbiology, ancillary and laboratory) are listed below for reference.    Significant Diagnostic Studies: Dg Cholangiogram Operative  Result Date: 12/22/2016 CLINICAL DATA:  Intraoperative cholangiogram during laparoscopic cholecystectomy. EXAM: INTRAOPERATIVE CHOLANGIOGRAM FLUOROSCOPY TIME:  40 seconds COMPARISON:  ERCP - 12/20/2016 FINDINGS: Intraoperative cholangiographic images of the right upper abdominal quadrant during laparoscopic cholecystectomy are provided for review. Surgical clips  overlie the expected location of the gallbladder fossa. Contrast injection demonstrates selective cannulation of the central aspect of the cystic duct. There is passage of contrast through the central aspect of the cystic duct with filling of a non dilated common bile duct. There is passage of contrast though the CBD and into the descending portion of the duodenum. There is minimal reflux of injected contrast into the common hepatic duct and central aspect of the non dilated intrahepatic biliary system. There is a persistent nonocclusive filling defect in the distal aspect of the CBD. A spinal stimulator pack overlies the right lower abdominal quadrant. IMPRESSION: Persistent nonocclusive filling defect within the distal aspect of the CBD which could be indicative of nonocclusive choledocholithiasis versus an air bubble. Correlation with the operative  report is recommended. Electronically Signed   By: Sandi Mariscal M.D.   On: 12/22/2016 12:23   Dg Ercp  Result Date: 12/20/2016 CLINICAL DATA:  CHOLELITHIASIS. CHOLEDOCHOLITHIASIS DEMONSTRATED BY ULTRASOUND. EXAM: ERCP with sphincterotomy and balloon sweep for stone removal TECHNIQUE: Multiple spot images obtained with the fluoroscopic device and submitted for interpretation post-procedure. FLUOROSCOPY TIME:  Fluoroscopy Time:  1 minutes 35 seconds COMPARISON:  12/19/2016 FINDINGS: Four spot fluoroscopic intraoperative views demonstrate retrograde cholangiogram. Biliary tree is normal in caliber without obstruction. Contrast refluxes into the gallbladder demonstrating cholelithiasis. Small distal CBD filling defects noted compatible with nonobstructing choledocholithiasis. Sphincterotomy and balloon sweep performed for removal. Please refer to the operative note for further details. IMPRESSION: Evidence of choledocholithiasis, status post balloon extraction Cholelithiasis No biliary obstruction These images were submitted for radiologic interpretation only. Please see  the procedural report for the amount of contrast and the fluoroscopy time utilized. Electronically Signed   By: Jerilynn Mages.  Shick M.D.   On: 12/20/2016 13:33   US Abdomen Limited Ruq  Result Date: 12/19/2016 CLINICAL DATA:  Right upper quadrant pain and vomiting for 2 days EXAM: US ABDOMEN LIMITED - RIGHT UPPER QUADRANT COMPARISON:  None. FINDINGS: Gallbladder: Calculi and sludge within the gallbladder lumen. The patient was tender to probe pressure over the gallbladder. There is gallbladder mural thickening to 6 mm. There is a trace pericholecystic fluid. The calculi measure up to 5 mm. Common bile duct: Diameter: 6.0 mm.  There is a 5 mm stone visible in the distal CBD Liver: No focal lesion identified. Within normal limits in parenchymal echogenicity. IMPRESSION: 1. Cholelithiasis and choledocholithiasis. 2. Gallbladder mural thickening, pericholecystic fluid and positive ultrasound Murphy sign, suspicious for acute cholecystitis. Electronically Signed   By: Andreas Newport M.D.   On: 12/19/2016 01:43    Microbiology: Recent Results (from the past 240 hour(s))  Surgical pcr screen     Status: None   Collection Time: 12/20/16  2:02 AM  Result Value Ref Range Status   MRSA, PCR NEGATIVE NEGATIVE Final   Staphylococcus aureus NEGATIVE NEGATIVE Final    Comment:        The Xpert SA Assay (FDA approved for NASAL specimens in patients over 41 years of age), is one component of a comprehensive surveillance program.  Test performance has been validated by [facility] for patients greater than or equal to 75 year old. It is not intended to diagnose infection nor to guide or monitor treatment.      Labs: CBC:  Recent Labs Lab 12/21/16 0532 12/22/16 0756 12/23/16 0416  WBC 7.7 7.1 7.8  NEUTROABS  --  4.4 5.8  HGB 11.4* 10.8* 10.8*  HCT 34.9* 35.4* 33.9*  MCV 96.4 98.1 96.6  PLT 170 174 034   Basic Metabolic Panel:  Recent Labs Lab 12/21/16 0532 12/22/16 0819 12/23/16 0416  NA 139  142 142  K 3.8 3.5 4.4  CL 108 109 106  CO2 24 26 27   GLUCOSE 129* 78 102*  BUN 6 7 7   CREATININE 0.74 0.80 0.78  CALCIUM 9.2 9.1 9.0  MG  --   --  1.9   Liver Function Tests:  Recent Labs Lab 12/21/16 0532 12/22/16 0819 12/23/16 0416  AST 430* 157* 77*  ALT 535* 375* 260*  ALKPHOS 404* 357* 309*  BILITOT 1.6* 0.5 0.6  PROT 6.0* 6.1* 5.5*  ALBUMIN 3.2* 3.4* 3.0*    Recent Labs Lab 12/21/16 0532 12/22/16 0819  LIPASE 740* 35   Time spent: 35  minutes  Signed:  Berle Mull  [facility] 12/23/2016 , 2:21 PM

## 2016-12-30 DIAGNOSIS — I10 Essential (primary) hypertension: Secondary | ICD-10-CM | POA: Diagnosis not present

## 2016-12-30 DIAGNOSIS — M1991 Primary osteoarthritis, unspecified site: Secondary | ICD-10-CM | POA: Diagnosis not present

## 2016-12-30 DIAGNOSIS — M797 Fibromyalgia: Secondary | ICD-10-CM | POA: Diagnosis not present

## 2016-12-30 DIAGNOSIS — Z7902 Long term (current) use of antithrombotics/antiplatelets: Secondary | ICD-10-CM | POA: Diagnosis not present

## 2016-12-30 DIAGNOSIS — J449 Chronic obstructive pulmonary disease, unspecified: Secondary | ICD-10-CM | POA: Diagnosis not present

## 2016-12-30 DIAGNOSIS — I69351 Hemiplegia and hemiparesis following cerebral infarction affecting right dominant side: Secondary | ICD-10-CM | POA: Diagnosis not present

## 2016-12-30 DIAGNOSIS — I2510 Atherosclerotic heart disease of native coronary artery without angina pectoris: Secondary | ICD-10-CM | POA: Diagnosis not present

## 2016-12-30 DIAGNOSIS — G6189 Other inflammatory polyneuropathies: Secondary | ICD-10-CM | POA: Diagnosis not present

## 2017-01-04 DIAGNOSIS — I69351 Hemiplegia and hemiparesis following cerebral infarction affecting right dominant side: Secondary | ICD-10-CM | POA: Diagnosis not present

## 2017-01-04 DIAGNOSIS — I10 Essential (primary) hypertension: Secondary | ICD-10-CM | POA: Diagnosis not present

## 2017-01-04 DIAGNOSIS — M1991 Primary osteoarthritis, unspecified site: Secondary | ICD-10-CM | POA: Diagnosis not present

## 2017-01-04 DIAGNOSIS — Z7902 Long term (current) use of antithrombotics/antiplatelets: Secondary | ICD-10-CM | POA: Diagnosis not present

## 2017-01-04 DIAGNOSIS — I2510 Atherosclerotic heart disease of native coronary artery without angina pectoris: Secondary | ICD-10-CM | POA: Diagnosis not present

## 2017-01-04 DIAGNOSIS — M797 Fibromyalgia: Secondary | ICD-10-CM | POA: Diagnosis not present

## 2017-01-04 DIAGNOSIS — G6189 Other inflammatory polyneuropathies: Secondary | ICD-10-CM | POA: Diagnosis not present

## 2017-01-05 DIAGNOSIS — Z7902 Long term (current) use of antithrombotics/antiplatelets: Secondary | ICD-10-CM | POA: Diagnosis not present

## 2017-01-05 DIAGNOSIS — G6189 Other inflammatory polyneuropathies: Secondary | ICD-10-CM | POA: Diagnosis not present

## 2017-01-05 DIAGNOSIS — M797 Fibromyalgia: Secondary | ICD-10-CM | POA: Diagnosis not present

## 2017-01-05 DIAGNOSIS — I10 Essential (primary) hypertension: Secondary | ICD-10-CM | POA: Diagnosis not present

## 2017-01-05 DIAGNOSIS — M1991 Primary osteoarthritis, unspecified site: Secondary | ICD-10-CM | POA: Diagnosis not present

## 2017-01-05 DIAGNOSIS — I2510 Atherosclerotic heart disease of native coronary artery without angina pectoris: Secondary | ICD-10-CM | POA: Diagnosis not present

## 2017-01-05 DIAGNOSIS — I69351 Hemiplegia and hemiparesis following cerebral infarction affecting right dominant side: Secondary | ICD-10-CM | POA: Diagnosis not present

## 2017-01-11 DIAGNOSIS — I69351 Hemiplegia and hemiparesis following cerebral infarction affecting right dominant side: Secondary | ICD-10-CM | POA: Diagnosis not present

## 2017-01-11 DIAGNOSIS — I10 Essential (primary) hypertension: Secondary | ICD-10-CM | POA: Diagnosis not present

## 2017-01-11 DIAGNOSIS — I2510 Atherosclerotic heart disease of native coronary artery without angina pectoris: Secondary | ICD-10-CM | POA: Diagnosis not present

## 2017-01-11 DIAGNOSIS — Z7902 Long term (current) use of antithrombotics/antiplatelets: Secondary | ICD-10-CM | POA: Diagnosis not present

## 2017-01-11 DIAGNOSIS — G6189 Other inflammatory polyneuropathies: Secondary | ICD-10-CM | POA: Diagnosis not present

## 2017-01-11 DIAGNOSIS — M1991 Primary osteoarthritis, unspecified site: Secondary | ICD-10-CM | POA: Diagnosis not present

## 2017-01-11 DIAGNOSIS — M797 Fibromyalgia: Secondary | ICD-10-CM | POA: Diagnosis not present

## 2017-01-14 DIAGNOSIS — M1991 Primary osteoarthritis, unspecified site: Secondary | ICD-10-CM | POA: Diagnosis not present

## 2017-01-14 DIAGNOSIS — I10 Essential (primary) hypertension: Secondary | ICD-10-CM | POA: Diagnosis not present

## 2017-01-14 DIAGNOSIS — M797 Fibromyalgia: Secondary | ICD-10-CM | POA: Diagnosis not present

## 2017-01-14 DIAGNOSIS — I2510 Atherosclerotic heart disease of native coronary artery without angina pectoris: Secondary | ICD-10-CM | POA: Diagnosis not present

## 2017-01-14 DIAGNOSIS — G6189 Other inflammatory polyneuropathies: Secondary | ICD-10-CM | POA: Diagnosis not present

## 2017-01-14 DIAGNOSIS — I69351 Hemiplegia and hemiparesis following cerebral infarction affecting right dominant side: Secondary | ICD-10-CM | POA: Diagnosis not present

## 2017-01-14 DIAGNOSIS — Z7902 Long term (current) use of antithrombotics/antiplatelets: Secondary | ICD-10-CM | POA: Diagnosis not present

## 2017-01-14 NOTE — Anesthesia Postprocedure Evaluation (Signed)
Anesthesia Post Note  Patient: Carol [name]  Procedure(s) Performed: Procedure(s) (LRB): ENDOSCOPIC RETROGRADE CHOLANGIOPANCREATOGRAPHY (ERCP) (N/A)     Anesthesia Post Evaluation  Last Vitals:  Vitals:   12/23/16 0920 12/23/16 0927  BP:  (!) 152/53  Pulse: 60   Resp: 18   Temp:      Last Pain:  Vitals:   12/23/16 1027  TempSrc:   PainSc: [place] Samyukta Cura

## 2017-01-14 NOTE — Addendum Note (Signed)
Addendum  created 01/14/17 1223 by Effie Berkshire, MD   Sign clinical note

## 2017-01-17 DIAGNOSIS — Z7902 Long term (current) use of antithrombotics/antiplatelets: Secondary | ICD-10-CM | POA: Diagnosis not present

## 2017-01-17 DIAGNOSIS — G6189 Other inflammatory polyneuropathies: Secondary | ICD-10-CM | POA: Diagnosis not present

## 2017-01-17 DIAGNOSIS — I10 Essential (primary) hypertension: Secondary | ICD-10-CM | POA: Diagnosis not present

## 2017-01-17 DIAGNOSIS — I69351 Hemiplegia and hemiparesis following cerebral infarction affecting right dominant side: Secondary | ICD-10-CM | POA: Diagnosis not present

## 2017-01-17 DIAGNOSIS — M797 Fibromyalgia: Secondary | ICD-10-CM | POA: Diagnosis not present

## 2017-01-17 DIAGNOSIS — M1991 Primary osteoarthritis, unspecified site: Secondary | ICD-10-CM | POA: Diagnosis not present

## 2017-01-17 DIAGNOSIS — I2510 Atherosclerotic heart disease of native coronary artery without angina pectoris: Secondary | ICD-10-CM | POA: Diagnosis not present

## 2017-01-17 NOTE — Anesthesia Postprocedure Evaluation (Signed)
Anesthesia Post Note  Patient: Carol [name]  Procedure(s) Performed: Procedure(s) (LRB): LAPAROSCOPIC CHOLECYSTECTOMY WITH INTRAOPERATIVE CHOLANGIOGRAM (N/A)     Anesthesia Post Evaluation  Last Vitals:  Vitals:   12/23/16 0920 12/23/16 0927  BP:  (!) 152/53  Pulse: 60   Resp: 18   Temp:      Last Pain:  Vitals:   12/23/16 1027  TempSrc:   PainSc: 4                  Tya Haughey

## 2017-01-17 NOTE — Addendum Note (Signed)
Addendum  created 01/17/17 1458 by Oleta Mouse, MD   Sign clinical note

## 2017-01-27 DIAGNOSIS — J069 Acute upper respiratory infection, unspecified: Secondary | ICD-10-CM | POA: Diagnosis not present

## 2017-01-27 DIAGNOSIS — J301 Allergic rhinitis due to pollen: Secondary | ICD-10-CM | POA: Diagnosis not present

## 2017-01-27 DIAGNOSIS — R10811 Right upper quadrant abdominal tenderness: Secondary | ICD-10-CM | POA: Diagnosis not present

## 2017-01-27 DIAGNOSIS — K819 Cholecystitis, unspecified: Secondary | ICD-10-CM | POA: Diagnosis not present

## 2017-01-27 DIAGNOSIS — I10 Essential (primary) hypertension: Secondary | ICD-10-CM | POA: Diagnosis not present

## 2017-01-30 DIAGNOSIS — J449 Chronic obstructive pulmonary disease, unspecified: Secondary | ICD-10-CM | POA: Diagnosis not present

## 2017-02-28 ENCOUNTER — Other Ambulatory Visit
Admission: RE | Admit: 2017-02-28 | Discharge: 2017-02-28 | Disposition: A | Discharge status: Home / Self Care | Payer: Medicare Other | Source: Ambulatory Visit | Attending: Nurse Practitioner | Admitting: Nurse Practitioner

## 2017-02-28 DIAGNOSIS — R945 Abnormal results of liver function studies: Secondary | ICD-10-CM | POA: Diagnosis not present

## 2017-02-28 DIAGNOSIS — D509 Iron deficiency anemia, unspecified: Secondary | ICD-10-CM | POA: Diagnosis not present

## 2017-02-28 LAB — CBC
HEMATOCRIT: 35.8 % (ref 35.0–47.0)
HEMOGLOBIN: 12.1 g/dL (ref 12.0–16.0)
MCH: 31.5 pg (ref 26.0–34.0)
MCHC: 33.9 g/dL (ref 32.0–36.0)
MCV: 92.9 fL (ref 80.0–100.0)
Platelets: 198 10*3/uL (ref 150–440)
RBC: 3.85 MIL/uL (ref 3.80–5.20)
RDW: 13.6 % (ref 11.5–14.5)
WBC: 6.4 10*3/uL (ref 3.6–11.0)

## 2017-02-28 LAB — BASIC METABOLIC PANEL
Anion gap: 5 (ref 5–15)
BUN: 15 mg/dL (ref 6–20)
CHLORIDE: 105 mmol/L (ref 101–111)
CO2: 28 mmol/L (ref 22–32)
Calcium: 9.2 mg/dL (ref 8.9–10.3)
Creatinine, Ser: 0.73 mg/dL (ref 0.44–1.00)
GFR calc non Af Amer: >60 mL/min (ref >60)
GLUCOSE: 101 mg/dL — AB (ref 65–99)
POTASSIUM: 4.1 mmol/L (ref 3.5–5.1)
Sodium: 138 mmol/L (ref 135–145)

## 2017-03-01 ENCOUNTER — Other Ambulatory Visit (HOSPITAL_COMMUNITY): Payer: Self-pay | Admitting: Surgery

## 2017-03-01 DIAGNOSIS — Z9049 Acquired absence of other specified parts of digestive tract: Secondary | ICD-10-CM | POA: Diagnosis not present

## 2017-03-01 DIAGNOSIS — J449 Chronic obstructive pulmonary disease, unspecified: Secondary | ICD-10-CM | POA: Diagnosis not present

## 2017-03-01 LAB — HEPATITIS C ANTIBODY: HCV Ab: <0.1 s/co ratio (ref 0.0–0.9)

## 2017-03-02 ENCOUNTER — Encounter
Admission: RE | Admit: 2017-03-02 | Discharge: 2017-03-02 | Disposition: A | Discharge status: Home / Self Care | Payer: Medicare Other | Source: Ambulatory Visit | Attending: Surgery | Admitting: Surgery

## 2017-03-02 DIAGNOSIS — Z9049 Acquired absence of other specified parts of digestive tract: Secondary | ICD-10-CM

## 2017-03-02 DIAGNOSIS — R109 Unspecified abdominal pain: Secondary | ICD-10-CM | POA: Diagnosis not present

## 2017-03-02 MED ORDER — TECHNETIUM TC 99M MEBROFENIN IV KIT
4.9800 | PACK | Freq: Once | INTRAVENOUS | Status: AC | PRN
  Administered 2017-03-02: 4.98 via INTRAVENOUS

## 2017-03-17 DIAGNOSIS — L299 Pruritus, unspecified: Secondary | ICD-10-CM | POA: Diagnosis not present

## 2017-03-17 DIAGNOSIS — R21 Rash and other nonspecific skin eruption: Secondary | ICD-10-CM | POA: Diagnosis not present

## 2017-04-01 DIAGNOSIS — J449 Chronic obstructive pulmonary disease, unspecified: Secondary | ICD-10-CM | POA: Diagnosis not present

## 2017-04-26 ENCOUNTER — Emergency Department
Admission: EM | Admit: 2017-04-26 | Discharge: 2017-04-26 | Disposition: A | Discharge status: Home / Self Care | Payer: Medicare Other | Attending: Emergency Medicine | Admitting: Emergency Medicine

## 2017-04-26 ENCOUNTER — Encounter: Payer: Self-pay | Admitting: *Deleted

## 2017-04-26 ENCOUNTER — Emergency Department: Payer: Medicare Other

## 2017-04-26 DIAGNOSIS — F17200 Nicotine dependence, unspecified, uncomplicated: Secondary | ICD-10-CM | POA: Diagnosis not present

## 2017-04-26 DIAGNOSIS — I2510 Atherosclerotic heart disease of native coronary artery without angina pectoris: Secondary | ICD-10-CM | POA: Diagnosis not present

## 2017-04-26 DIAGNOSIS — R1013 Epigastric pain: Secondary | ICD-10-CM | POA: Diagnosis not present

## 2017-04-26 DIAGNOSIS — R079 Chest pain, unspecified: Secondary | ICD-10-CM | POA: Diagnosis not present

## 2017-04-26 DIAGNOSIS — E039 Hypothyroidism, unspecified: Secondary | ICD-10-CM | POA: Diagnosis not present

## 2017-04-26 DIAGNOSIS — Z79899 Other long term (current) drug therapy: Secondary | ICD-10-CM | POA: Diagnosis not present

## 2017-04-26 DIAGNOSIS — R1030 Lower abdominal pain, unspecified: Secondary | ICD-10-CM | POA: Diagnosis not present

## 2017-04-26 DIAGNOSIS — I10 Essential (primary) hypertension: Secondary | ICD-10-CM | POA: Diagnosis not present

## 2017-04-26 DIAGNOSIS — R109 Unspecified abdominal pain: Secondary | ICD-10-CM | POA: Insufficient documentation

## 2017-04-26 LAB — COMPREHENSIVE METABOLIC PANEL
ALK PHOS: 110 U/L (ref 38–126)
ALT: 22 U/L (ref 14–54)
ANION GAP: 6 (ref 5–15)
AST: 23 U/L (ref 15–41)
Albumin: 3.9 g/dL (ref 3.5–5.0)
BUN: 12 mg/dL (ref 6–20)
CO2: 27 mmol/L (ref 22–32)
CREATININE: 0.79 mg/dL (ref 0.44–1.00)
Calcium: 9.6 mg/dL (ref 8.9–10.3)
Chloride: 106 mmol/L (ref 101–111)
Glucose, Bld: 91 mg/dL (ref 65–99)
Potassium: 4.5 mmol/L (ref 3.5–5.1)
Sodium: 139 mmol/L (ref 135–145)
Total Bilirubin: 0.3 mg/dL (ref 0.3–1.2)
Total Protein: 7 g/dL (ref 6.5–8.1)

## 2017-04-26 LAB — URINALYSIS, COMPLETE (UACMP) WITH MICROSCOPIC
BILIRUBIN URINE: NEGATIVE
Bacteria, UA: NONE SEEN
GLUCOSE, UA: NEGATIVE mg/dL
Hgb urine dipstick: NEGATIVE
KETONES UR: NEGATIVE mg/dL
LEUKOCYTES UA: NEGATIVE
NITRITE: NEGATIVE
PH: 6 (ref 5.0–8.0)
Protein, ur: NEGATIVE mg/dL
SPECIFIC GRAVITY, URINE: 1.009 (ref 1.005–1.030)
WBC, UA: NONE SEEN WBC/hpf (ref 0–5)

## 2017-04-26 LAB — CBC
HCT: 36.2 % (ref 35.0–47.0)
Hemoglobin: 12.3 g/dL (ref 12.0–16.0)
MCH: 31.2 pg (ref 26.0–34.0)
MCHC: 34 g/dL (ref 32.0–36.0)
MCV: 92 fL (ref 80.0–100.0)
PLATELETS: 195 10*3/uL (ref 150–440)
RBC: 3.94 MIL/uL (ref 3.80–5.20)
RDW: 13.3 % (ref 11.5–14.5)
WBC: 6.4 10*3/uL (ref 3.6–11.0)

## 2017-04-26 LAB — LIPASE, BLOOD: LIPASE: 21 U/L (ref 11–51)

## 2017-04-26 MED ORDER — ONDANSETRON 4 MG PO TBDP: 4.0000 mg | ORAL_TABLET | Freq: Three times a day (TID) | ORAL | 0 refills | Status: DC | PRN

## 2017-04-26 MED ORDER — HYDROMORPHONE HCL 1 MG/ML IJ SOLN
0.5000 mg | Freq: Once | INTRAMUSCULAR | Status: AC
  Administered 2017-04-26: 0.5 mg via INTRAVENOUS
  Filled 2017-04-26: qty 1

## 2017-04-26 MED ORDER — SIMETHICONE 80 MG PO CHEW: 80.0000 mg | CHEWABLE_TABLET | Freq: Four times a day (QID) | ORAL | 0 refills | Status: DC | PRN

## 2017-04-26 MED ORDER — SODIUM CHLORIDE 0.9 % IV SOLN
1000.0000 mL | Freq: Once | INTRAVENOUS | Status: AC
Start: 2017-04-26 — End: 2017-04-26
  Administered 2017-04-26: 1000 mL via INTRAVENOUS

## 2017-04-26 MED ORDER — HYDROMORPHONE HCL 1 MG/ML IJ SOLN
1.0000 mg | Freq: Once | INTRAMUSCULAR | Status: AC
  Administered 2017-04-26: 1 mg via INTRAVENOUS

## 2017-04-26 MED ORDER — ONDANSETRON HCL 4 MG/2ML IJ SOLN
4.0000 mg | Freq: Once | INTRAMUSCULAR | Status: AC
  Administered 2017-04-26: 4 mg via INTRAVENOUS
  Filled 2017-04-26: qty 2

## 2017-04-26 MED ORDER — MORPHINE SULFATE (PF) 4 MG/ML IV SOLN
4.0000 mg | Freq: Once | INTRAVENOUS | Status: AC
  Administered 2017-04-26: 4 mg via INTRAVENOUS
  Filled 2017-04-26: qty 1

## 2017-04-26 MED ORDER — IOPAMIDOL (ISOVUE-370) INJECTION 76%
100.0000 mL | Freq: Once | INTRAVENOUS | Status: AC | PRN
  Administered 2017-04-26: 100 mL via INTRAVENOUS

## 2017-04-26 MED ORDER — SIMETHICONE 40 MG/0.6ML PO SUSP (UNIT DOSE)
40.0000 mg | Freq: Once | ORAL | Status: AC
  Administered 2017-04-26: 40 mg via ORAL
  Filled 2017-04-26: qty 0.6

## 2017-04-26 MED ORDER — HYDROMORPHONE HCL 1 MG/ML IJ SOLN
INTRAMUSCULAR | Status: AC
  Filled 2017-04-26: qty 1

## 2017-04-26 NOTE — ED Notes (Signed)
Pt verbalizes understanding of d/c teaching and rx given. Pt denies any further questions at this time. Pt in NAD at time of d/c, pt in wc to lobby with spouse. Pt VS stable.

## 2017-04-26 NOTE — Discharge Instructions (Signed)
Please take a clear liquid diet for the next 12-24 hours, then advance to bland diet as tolerated. You can take simethicone for pain and Zofran as for nausea and vomiting.  Return to the emergency department if you develop severe pain, inability to keep down fluids, fever, or any other symptoms concerning to you.

## 2017-04-26 NOTE — ED Triage Notes (Signed)
Pt to ED reporting right sided flank and groin pain for 2 days. PT reports increased urinary frequency but denies burning pr pain. No changes in BM , no fevers reported. Pt also NVD.   Pt also reporting "swelling and cramping in arms and legs" Pt also reports having numbness in arms and legs intermittently in the evenings.

## 2017-04-26 NOTE — ED Provider Notes (Signed)
[facility] Emergency Department Provider Note   ____________________________________________    I have reviewed the triage vital signs and the nursing notes.   HISTORY  Chief Complaint Flank Pain and Groin Pain     HPI Carol [name] is a 61 y.o. female presents with complaints of right-sided flank pain with some groin pain over the last 2 days. She has had frequency but no dysuria. No history of kidney stones. Mild nausea but no vomiting. Normal stools, afebrile. She also describes aching in her joints diffusely. No history of the same. No vaginal bleeding. She is taking Tylenol for this with no significant improvement.   Past Medical History:  Diagnosis Date  . Bipolar affective disorder ([place])   . Coronary artery disease   . Hyperlipidemia   . Hypertension   . Hypothyroid   . Myocardial infarction ([place])   . Stroke [facility]) 11/15/2016   "right side weaker since" (12/20/2016)    Patient Active Problem List   Diagnosis Date Noted  . Choledocholithiasis with acute cholecystitis 12/19/2016  . Coronary artery disease 12/19/2016  . Essential hypertension 12/19/2016  . Hypothyroidism 12/19/2016  . History of stroke 12/19/2016  . Migraine 06/17/2016  . Chest pain 05/12/2015  . Right facial numbness 03/30/2015  . Lithium intoxication 02/27/2015  . Bipolar disorder ([place]) 02/27/2015  . CVA (cerebral infarction) 02/26/2015  . Unstable angina ([place]) 02/03/2015    Past Surgical History:  Procedure Laterality Date  . ABDOMINAL HYSTERECTOMY    . APPENDECTOMY    . CARDIAC CATHETERIZATION Left 09/26/2015   Procedure: Left Heart Cath and Coronary Angiography;  Surgeon: Teodoro Spray, MD;  Location: [place] [facility];  Service: Cardiovascular;  Laterality: Left;  . CHOLECYSTECTOMY N/A 12/22/2016   Procedure: LAPAROSCOPIC CHOLECYSTECTOMY WITH INTRAOPERATIVE CHOLANGIOGRAM;  Surgeon: Jovita Kussmaul, MD;  Location: [place];  Service: General;  Laterality: N/A;    . ERCP N/A 12/20/2016   Procedure: ENDOSCOPIC RETROGRADE CHOLANGIOPANCREATOGRAPHY (ERCP);  Surgeon: Teena Irani, MD;  Location: [facility] ENDOSCOPY;  Service: Endoscopy;  Laterality: N/A;  . HYSTEROTOMY      Prior to Admission medications   Medication Sig Start Date End Date Taking? Authorizing Provider  albuterol (PROVENTIL HFA;VENTOLIN HFA) 108 (90 BASE) MCG/ACT inhaler Inhale 2 puffs into the lungs every 6 (six) hours as needed for wheezing or shortness of breath.   Yes [provider]  albuterol (PROVENTIL) (2.5 MG/3ML) 0.083% nebulizer solution Take 2.5 mg by nebulization every 6 (six) hours as needed for wheezing or shortness of breath.   Yes [provider]  atorvastatin (LIPITOR) 80 MG tablet Take 80 mg by mouth daily. 11/18/16  Yes [provider]  CALCIUM CARBONATE-VITAMIN D PO Take 1 tablet by mouth daily.   Yes [provider]  cetirizine (ZYRTEC) 10 MG tablet Take 10 mg by mouth daily as needed.    Yes [provider]  citalopram (CELEXA) 40 MG tablet Take 40 mg by mouth daily.   Yes [provider]  clopidogrel (PLAVIX) 75 MG tablet Take 75 mg by mouth daily.    Yes [provider]  ferrous sulfate 325 (65 FE) MG tablet Take 325 mg by mouth every 7 (seven) days. Pt takes on Saturday.   Yes [provider]  isosorbide mononitrate (IMDUR) 30 MG 24 hr tablet Take 30 mg by mouth daily.   Yes [provider]  levothyroxine (SYNTHROID, LEVOTHROID) 100 MCG tablet Take 100 mcg by mouth daily.   Yes [provider]  lisinopril (PRINIVIL,ZESTRIL) 5 MG tablet Take 5 mg by mouth daily.   Yes [provider]  lubiprostone (AMITIZA) 8 MCG capsule Take 8 mcg by mouth 2 (two) times daily as needed for constipation.   Yes [provider]  metoprolol tartrate (LOPRESSOR) 25 MG tablet Take 25 mg by mouth 2 (two) times daily.   Yes [provider]  saccharomyces boulardii (FLORASTOR) 250 MG  capsule Take 1 capsule (250 mg total) by mouth 2 (two) times daily. 12/23/16  Yes Lavina Hamman, MD  traMADol (ULTRAM) 50 MG tablet Take 50 mg by mouth 2 (two) times daily as needed. 02/25/17  Yes [provider]  HYDROcodone-acetaminophen (NORCO) 5-325 MG tablet Take 1 tablet by mouth every 6 (six) hours as needed for severe pain. Patient not taking: Reported on 04/26/2017 12/23/16   Lavina Hamman, MD  lidocaine (LIDODERM) 5 % Place 1 patch onto the skin every 12 (twelve) hours. Remove & Discard patch within 12 hours or as directed by MD Patient not taking: Reported on 12/19/2016 08/29/16 08/29/17  Hagler, Jami L, PA-C  methocarbamol (ROBAXIN) 500 MG tablet Take 1 tablet (500 mg total) by mouth 3 (three) times daily. Patient not taking: Reported on 04/26/2017 12/23/16   Lavina Hamman, MD     Allergies Morphine; Pregabalin; Rofecoxib; Sulfamethoxazole-trimethoprim; and Asa [aspirin]  Family History  Problem Relation Age of Onset  . CAD Unknown     Social History Social History  Substance Use Topics  . Smoking status: Current Every Day Smoker    Packs/day: 0.50    Years: 45.00  . Smokeless tobacco: Never Used  . Alcohol use No    Review of Systems  Constitutional: No fever/chills Eyes: No visual changes.  ENT: No sore throat. Cardiovascular: Denies chest pain. Respiratory: Denies shortness of breath. Gastrointestinal: as above Genitourinary: Negative for dysuria. positive frequency  Musculoskeletal: Negative for back pain. Skin: Negative for rash. Neurological: Negative for headaches    ____________________________________________   PHYSICAL EXAM:  VITAL SIGNS: ED Triage Vitals  Enc Vitals Group     BP 04/26/17 1219 (!) 121/50     Pulse Rate 04/26/17 1219 (!) 52     Resp 04/26/17 1400 13     Temp 04/26/17 1219 98.2 F (36.8 C)     Temp Source 04/26/17 1219 Oral     SpO2 04/26/17 1219 96 %     Weight 04/26/17 1220 61.2 kg (135 lb)     Height 04/26/17 1220  1.524 m (5')     Head Circumference --      Peak Flow --      Pain Score 04/26/17 1222 8     Pain Loc --      Pain Edu? --      Excl. in [place]? --     Constitutional: Alert and oriented. No acute distress. Pleasant and interactive Eyes: Conjunctivae are normal.   Nose: No congestion/rhinnorhea. Mouth/Throat: Mucous membranes are moist.   Neck:  Painless ROM Cardiovascular: Normal rate, regular rhythm.   Good peripheral circulation. Respiratory: Normal respiratory effort.  No retractions. Lungs CTAB. Gastrointestinal: Soft and nontender. No distention.  Genitourinary: deferred Musculoskeletal:   Warm and well perfused Neurologic:  Normal speech and language. No gross focal neurologic deficits are appreciated.  Skin:  Skin is warm, dry and intact. No rash noted. Psychiatric: Mood and affect are normal. Speech and behavior are normal.  ____________________________________________   LABS (all labs ordered are listed, but only  abnormal results are displayed)  Labs Reviewed  URINALYSIS, COMPLETE (UACMP) WITH MICROSCOPIC - Abnormal; Notable for the following:       Result Value   Color, Urine YELLOW (*)    APPearance CLEAR (*)    Squamous Epithelial / LPF 0-5 (*)    All other components within normal limits  CBC  COMPREHENSIVE METABOLIC PANEL  LIPASE, BLOOD   ____________________________________________  EKG None _____________________________________________________________   PROCEDURES  Procedure(s) performed: No    Critical Care performed:No ____________________________________________   INITIAL IMPRESSION / ASSESSMENT AND PLAN / ED COURSE  Pertinent labs & imaging results that were available during my care of the patient were reviewed by me and considered in my medical decision making    Patient presents with right-sided flank pain radiating into the groin. No injury to the area. Description is suspicious for ureterolithiasis however no blood in the urine. No  evidence of UTI either. We will obtain CT renal stone study, given IV morphine and Zofran (patient says that she is acted funny after morphine in the past but she is not allergic) and reevaluate   patient felt somewhat better after morphine. Renal stone study is unremarkable.  ----------------------------------------- 3:30 PM on 04/26/2017 -----------------------------------------  Called to bedside for worsening pain, now more epigastric and into the chest. Patient's has a feeling of air expanding inside of her. Stat upright KUB negative for free air. IV Dilaudid resolved the patient's pain. I will send her for CT angiography of the chest abdomen and pelvis to evaluate for dissection   FINAL CLINICAL IMPRESSION(S) / ED DIAGNOSES   abdominal pain    NEW MEDICATIONS STARTED DURING THIS VISIT:  New Prescriptions   No medications on file     Note:  This document was prepared using Dragon voice recognition software and may include unintentional dictation errors.    Lavonia Drafts, MD 04/26/17 [id number]

## 2017-04-26 NOTE — ED Provider Notes (Signed)
This patient was signed out to me by Dr. Lavonia Drafts. 61 year old female with intermittent right flank pain and right lower quadrant pain for the past several days. On arrival to the emergency department, the patient is mildly red cardiac and otherwise hemodynamically stable and afebrile. After a reassuring noncontrast CT scan, the patient developed worsening pain and immediately underwent KUB examination which did not show any free air. She underwent CT angiogram, which does not show any acute cause for her pain. This time, the patient's pain is almost completely resolved. She is able to tolerate liquids. Her laboratory studies are also reassuring. At this time, the patient has no pathology which would require immediate intervention or admission to the hospital. She is stable for discharge and will have close PMD and GI follow-up. She understands return precautions as well as follow-up instructions.   Eula Listen, MD 04/26/17 1655

## 2017-05-05 DIAGNOSIS — K219 Gastro-esophageal reflux disease without esophagitis: Secondary | ICD-10-CM | POA: Diagnosis not present

## 2017-05-05 DIAGNOSIS — R21 Rash and other nonspecific skin eruption: Secondary | ICD-10-CM | POA: Diagnosis not present

## 2017-06-06 DIAGNOSIS — H9203 Otalgia, bilateral: Secondary | ICD-10-CM | POA: Diagnosis not present

## 2017-06-06 DIAGNOSIS — J069 Acute upper respiratory infection, unspecified: Secondary | ICD-10-CM | POA: Diagnosis not present

## 2017-06-25 DIAGNOSIS — H524 Presbyopia: Secondary | ICD-10-CM | POA: Diagnosis not present

## 2017-07-04 ENCOUNTER — Other Ambulatory Visit: Payer: Self-pay | Admitting: Gastroenterology

## 2017-07-04 DIAGNOSIS — R1031 Right lower quadrant pain: Secondary | ICD-10-CM

## 2017-07-04 DIAGNOSIS — R197 Diarrhea, unspecified: Secondary | ICD-10-CM | POA: Diagnosis not present

## 2017-07-12 ENCOUNTER — Ambulatory Visit
Admission: RE | Admit: 2017-07-12 | Discharge: 2017-07-12 | Disposition: A | Discharge status: Home / Self Care | Payer: Medicare Other | Source: Ambulatory Visit | Attending: Gastroenterology | Admitting: Gastroenterology

## 2017-07-12 DIAGNOSIS — R197 Diarrhea, unspecified: Secondary | ICD-10-CM

## 2017-07-12 DIAGNOSIS — R1030 Lower abdominal pain, unspecified: Secondary | ICD-10-CM | POA: Diagnosis not present

## 2017-07-12 DIAGNOSIS — R1031 Right lower quadrant pain: Secondary | ICD-10-CM

## 2017-07-12 MED ORDER — IOPAMIDOL (ISOVUE-300) INJECTION 61%
100.0000 mL | Freq: Once | INTRAVENOUS | Status: AC | PRN
  Administered 2017-07-12: 100 mL via INTRAVENOUS

## 2017-07-20 IMAGING — DX DG HAND COMPLETE 3+V*R*
3 series · 3 of 3 positions shown · non-contrast
Comparison: None.

CLINICAL DATA: Right hand numbness, cold sensation and tingling
since a dog bite 06/25/2016.

EXAM:
RIGHT HAND - COMPLETE 3+ VIEW

[hand ap]
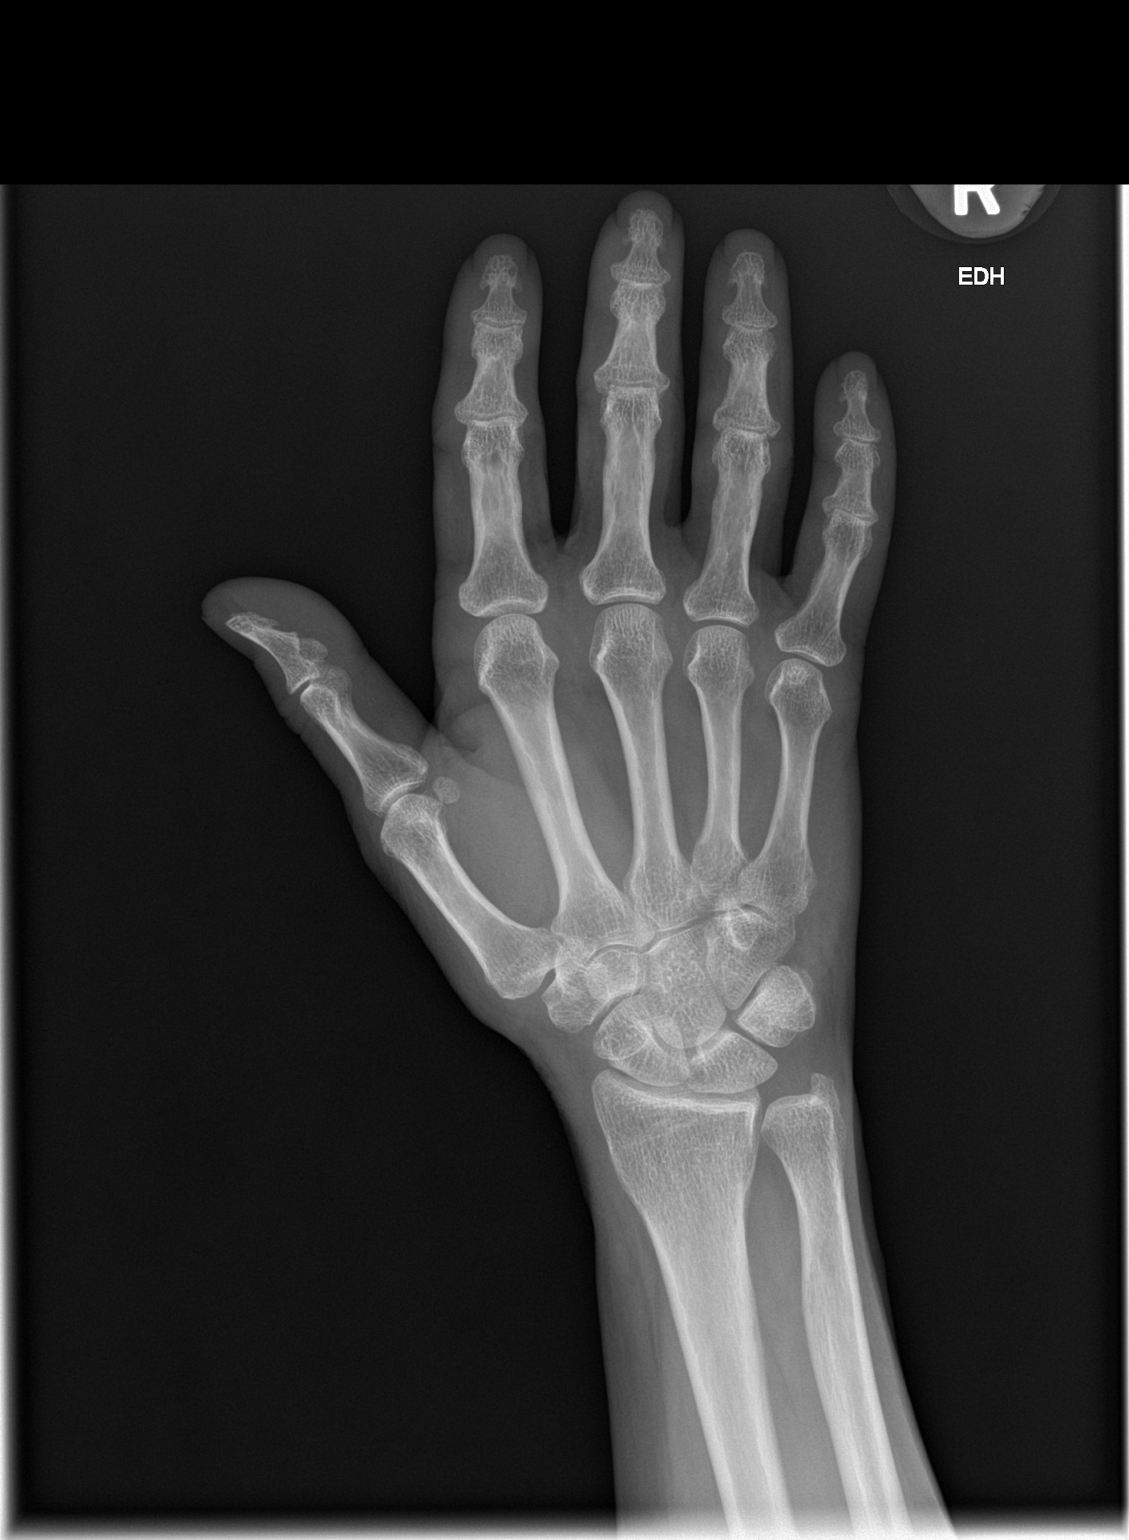

[hand obl]
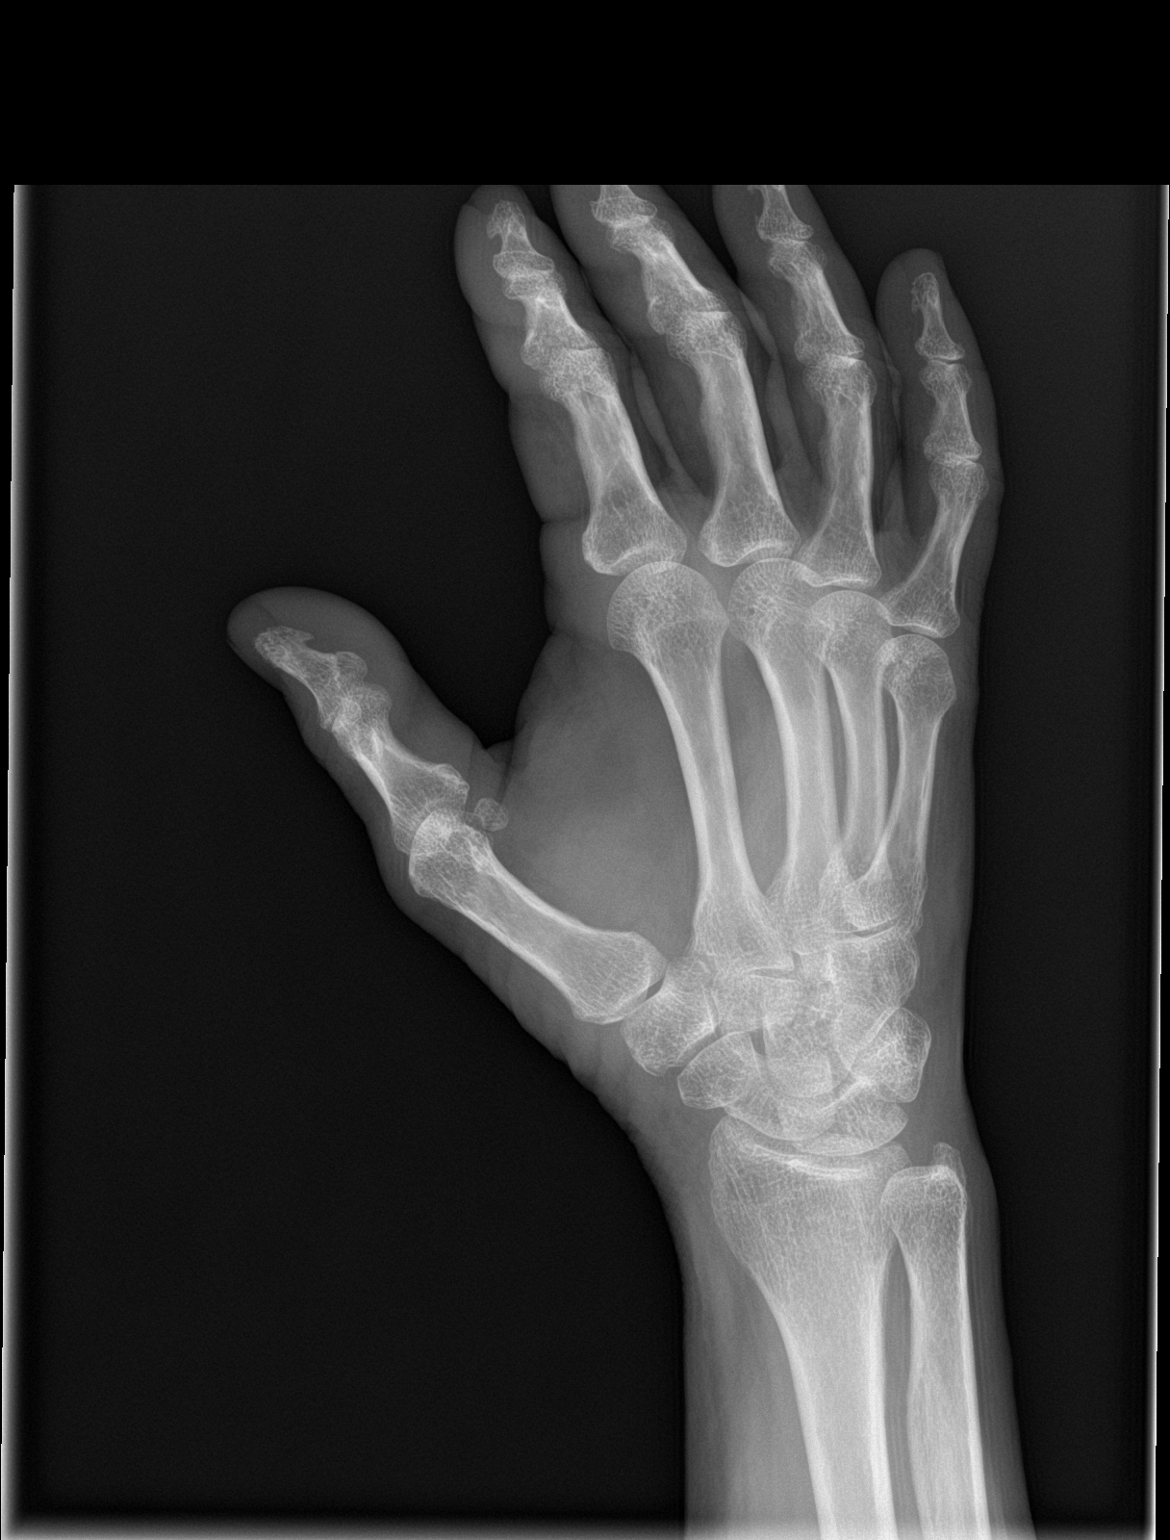

[hand lat]
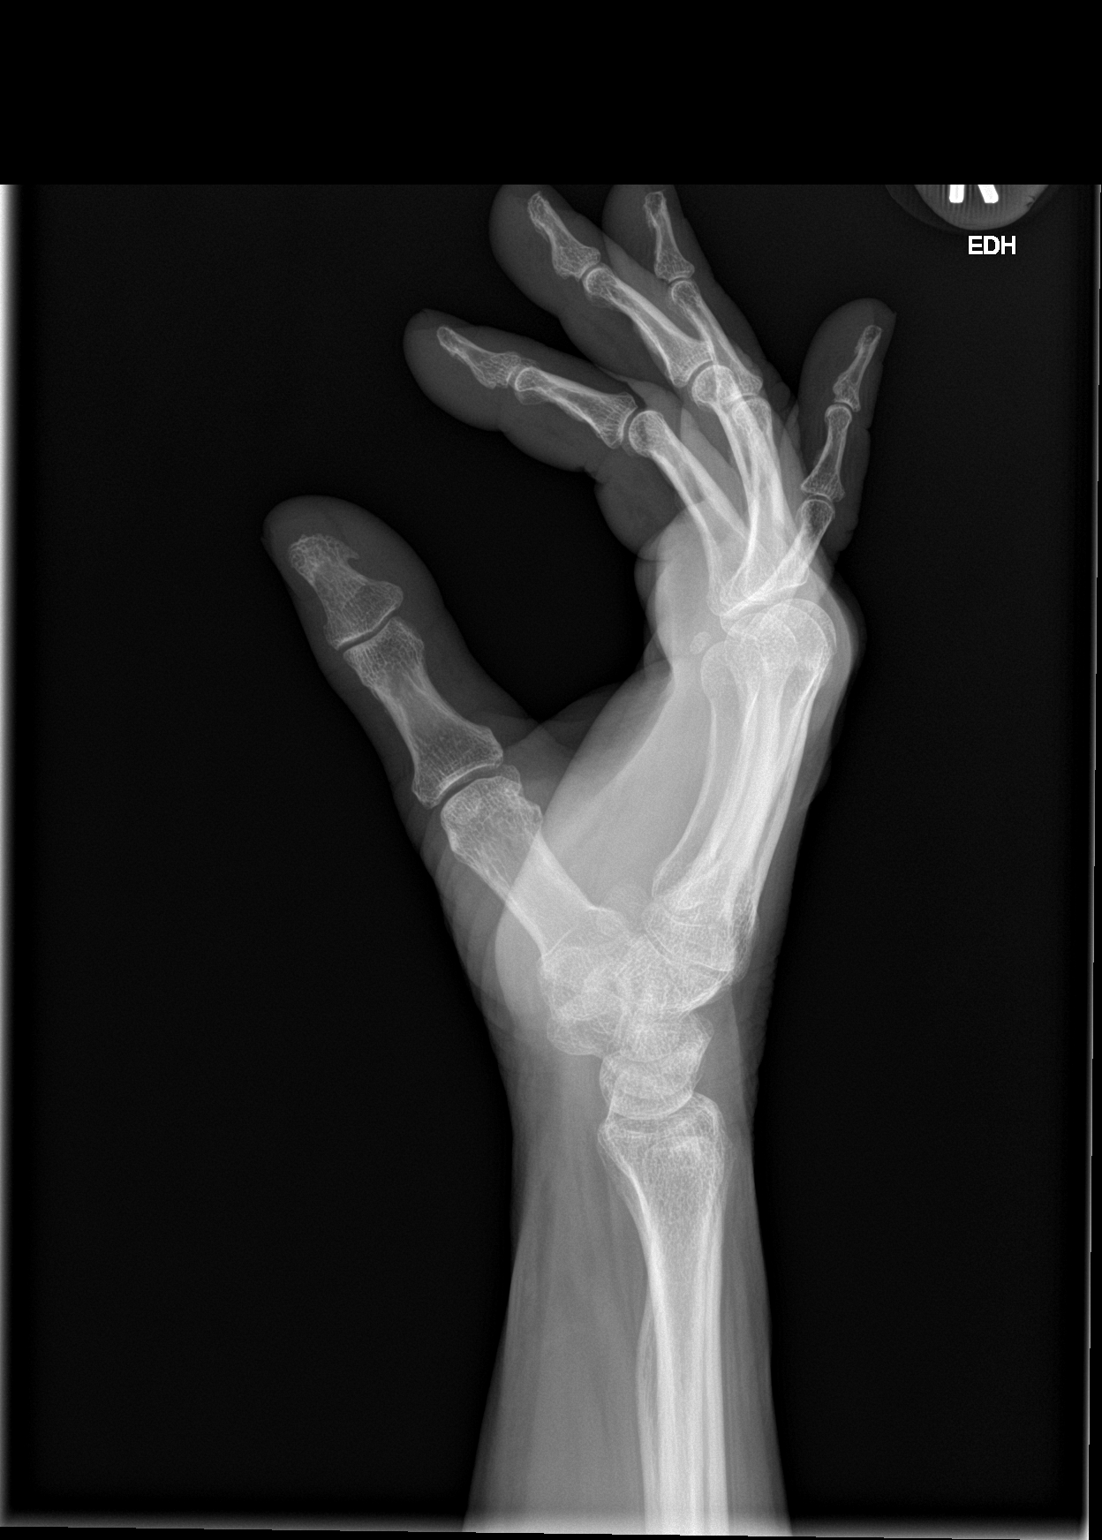

[3 of 3 positions shown; findings below may reference images not displayed]

FINDINGS: There is no evidence of fracture or dislocation. There is no
evidence of arthropathy or other focal bone abnormality. Soft
tissues are unremarkable.
IMPRESSION: Negative exam.

## 2017-09-06 ENCOUNTER — Other Ambulatory Visit: Payer: Self-pay | Admitting: Internal Medicine

## 2017-09-13 ENCOUNTER — Encounter: Payer: Self-pay | Admitting: Nurse Practitioner

## 2017-09-13 NOTE — Progress Notes (Unsigned)
Received a fax for prior authorization for Tramadol. Per rep at [facility] this medication does not require auth ref# [id number].tat

## 2017-09-28 ENCOUNTER — Other Ambulatory Visit: Payer: Self-pay

## 2017-09-28 MED ORDER — SIMVASTATIN 20 MG PO TABS: 20.0000 mg | ORAL_TABLET | Freq: Every day | ORAL | 3 refills | Status: DC

## 2017-09-28 NOTE — Telephone Encounter (Signed)
Pt called that she is not on lipitor in august April chang to simvastatin back as per dr Humphrey Rolls d/c lipitor and send simvastatin

## 2017-10-04 DIAGNOSIS — Z720 Tobacco use: Secondary | ICD-10-CM | POA: Diagnosis not present

## 2017-10-04 DIAGNOSIS — I10 Essential (primary) hypertension: Secondary | ICD-10-CM | POA: Diagnosis not present

## 2017-10-04 DIAGNOSIS — E782 Mixed hyperlipidemia: Secondary | ICD-10-CM | POA: Diagnosis not present

## 2017-10-04 DIAGNOSIS — I2510 Atherosclerotic heart disease of native coronary artery without angina pectoris: Secondary | ICD-10-CM | POA: Diagnosis not present

## 2017-10-12 ENCOUNTER — Ambulatory Visit: Payer: Self-pay | Admitting: Internal Medicine

## 2017-10-25 ENCOUNTER — Other Ambulatory Visit: Payer: Self-pay | Admitting: Internal Medicine

## 2017-12-01 DIAGNOSIS — M2550 Pain in unspecified joint: Secondary | ICD-10-CM | POA: Diagnosis not present

## 2017-12-01 DIAGNOSIS — Z1159 Encounter for screening for other viral diseases: Secondary | ICD-10-CM | POA: Diagnosis not present

## 2017-12-01 DIAGNOSIS — M79641 Pain in right hand: Secondary | ICD-10-CM | POA: Diagnosis not present

## 2017-12-01 DIAGNOSIS — M79642 Pain in left hand: Secondary | ICD-10-CM | POA: Diagnosis not present

## 2017-12-01 DIAGNOSIS — M7989 Other specified soft tissue disorders: Secondary | ICD-10-CM | POA: Diagnosis not present

## 2017-12-01 DIAGNOSIS — E785 Hyperlipidemia, unspecified: Secondary | ICD-10-CM | POA: Insufficient documentation

## 2018-01-07 DIAGNOSIS — R05 Cough: Secondary | ICD-10-CM | POA: Diagnosis not present

## 2018-01-07 DIAGNOSIS — J440 Chronic obstructive pulmonary disease with acute lower respiratory infection: Secondary | ICD-10-CM | POA: Diagnosis not present

## 2018-02-07 DIAGNOSIS — M1612 Unilateral primary osteoarthritis, left hip: Secondary | ICD-10-CM | POA: Diagnosis not present

## 2018-02-07 DIAGNOSIS — M1611 Unilateral primary osteoarthritis, right hip: Secondary | ICD-10-CM | POA: Diagnosis not present

## 2018-02-07 DIAGNOSIS — M170 Bilateral primary osteoarthritis of knee: Secondary | ICD-10-CM | POA: Diagnosis not present

## 2018-02-13 DIAGNOSIS — M26609 Unspecified temporomandibular joint disorder, unspecified side: Secondary | ICD-10-CM | POA: Diagnosis not present

## 2018-02-13 DIAGNOSIS — M797 Fibromyalgia: Secondary | ICD-10-CM | POA: Diagnosis not present

## 2018-02-13 DIAGNOSIS — M19071 Primary osteoarthritis, right ankle and foot: Secondary | ICD-10-CM | POA: Diagnosis not present

## 2018-02-13 DIAGNOSIS — M542 Cervicalgia: Secondary | ICD-10-CM | POA: Diagnosis not present

## 2018-02-13 DIAGNOSIS — M25571 Pain in right ankle and joints of right foot: Secondary | ICD-10-CM | POA: Diagnosis not present

## 2018-02-13 DIAGNOSIS — M791 Myalgia, unspecified site: Secondary | ICD-10-CM | POA: Diagnosis not present

## 2018-02-13 DIAGNOSIS — Z886 Allergy status to analgesic agent status: Secondary | ICD-10-CM | POA: Diagnosis not present

## 2018-02-13 DIAGNOSIS — M19072 Primary osteoarthritis, left ankle and foot: Secondary | ICD-10-CM | POA: Diagnosis not present

## 2018-02-13 DIAGNOSIS — Z9289 Personal history of other medical treatment: Secondary | ICD-10-CM | POA: Diagnosis not present

## 2018-02-13 DIAGNOSIS — Z882 Allergy status to sulfonamides status: Secondary | ICD-10-CM | POA: Diagnosis not present

## 2018-02-13 DIAGNOSIS — M25572 Pain in left ankle and joints of left foot: Secondary | ICD-10-CM | POA: Diagnosis not present

## 2018-02-13 DIAGNOSIS — M19039 Primary osteoarthritis, unspecified wrist: Secondary | ICD-10-CM | POA: Diagnosis not present

## 2018-02-13 DIAGNOSIS — M722 Plantar fascial fibromatosis: Secondary | ICD-10-CM | POA: Diagnosis not present

## 2018-02-13 DIAGNOSIS — Z885 Allergy status to narcotic agent status: Secondary | ICD-10-CM | POA: Diagnosis not present

## 2018-02-13 DIAGNOSIS — I2510 Atherosclerotic heart disease of native coronary artery without angina pectoris: Secondary | ICD-10-CM | POA: Diagnosis not present

## 2018-02-13 DIAGNOSIS — H9190 Unspecified hearing loss, unspecified ear: Secondary | ICD-10-CM | POA: Diagnosis not present

## 2018-02-13 DIAGNOSIS — Z7952 Long term (current) use of systemic steroids: Secondary | ICD-10-CM | POA: Diagnosis not present

## 2018-02-13 DIAGNOSIS — Z7951 Long term (current) use of inhaled steroids: Secondary | ICD-10-CM | POA: Diagnosis not present

## 2018-02-13 DIAGNOSIS — I10 Essential (primary) hypertension: Secondary | ICD-10-CM | POA: Diagnosis not present

## 2018-02-13 DIAGNOSIS — M25579 Pain in unspecified ankle and joints of unspecified foot: Secondary | ICD-10-CM | POA: Diagnosis not present

## 2018-02-13 DIAGNOSIS — E785 Hyperlipidemia, unspecified: Secondary | ICD-10-CM | POA: Diagnosis not present

## 2018-02-13 DIAGNOSIS — G43909 Migraine, unspecified, not intractable, without status migrainosus: Secondary | ICD-10-CM | POA: Diagnosis not present

## 2018-02-13 DIAGNOSIS — E039 Hypothyroidism, unspecified: Secondary | ICD-10-CM | POA: Diagnosis not present

## 2018-02-13 DIAGNOSIS — I252 Old myocardial infarction: Secondary | ICD-10-CM | POA: Diagnosis not present

## 2018-02-13 DIAGNOSIS — Z8673 Personal history of transient ischemic attack (TIA), and cerebral infarction without residual deficits: Secondary | ICD-10-CM | POA: Diagnosis not present

## 2018-02-13 DIAGNOSIS — M47812 Spondylosis without myelopathy or radiculopathy, cervical region: Secondary | ICD-10-CM | POA: Diagnosis not present

## 2018-02-15 ENCOUNTER — Telehealth: Payer: Self-pay

## 2018-02-15 ENCOUNTER — Telehealth: Payer: Self-pay | Admitting: Internal Medicine

## 2018-02-15 NOTE — Telephone Encounter (Signed)
SPOKE WITH [facility] ABOUT POSSIBLE REFERRAL FOR PATIENT WITHOUT VISIT. [facility] GAVE OK AND WE WILL PUT IN REFERRAL NEXT WEEK ON Monday POSSIBLY.

## 2018-02-15 NOTE — Telephone Encounter (Signed)
PT CALLED CRYING IN PAIN AND STATES THAT SHE HAS BEEN TO [facility] TWICE AND CAN NOT GET IN TO SEE ANYONE ABOUT HER PAIN, SHE IS OUT OF PAIN MEDICATION AND IS IN SO MUCH PAIN, WE HAVE NO OPENINGS THIS WEEK TOLD PATIENT TO GO TO URGENT CARE OR EMERGENCY ROOM AND CHECK BACK WITH [place] REGARDING AN APPOINTMENT ON Friday

## 2018-02-15 NOTE — Telephone Encounter (Signed)
Pt called to get an appt because of back and joint pain and needed referral to pain clinic. Pt was advised the soonest appt was the one she had for her annual wellness visit per Magda Paganini and to call back and see if we have any openings at a later date. She had an appt in February to get a referral to rheumatology and it was a no show.

## 2018-02-26 ENCOUNTER — Other Ambulatory Visit: Payer: Self-pay | Admitting: Internal Medicine

## 2018-02-27 ENCOUNTER — Other Ambulatory Visit: Payer: Self-pay

## 2018-02-27 ENCOUNTER — Emergency Department
Admission: EM | Admit: 2018-02-27 | Discharge: 2018-02-27 | Disposition: A | Discharge status: Home / Self Care | Payer: Medicare Other | Attending: Emergency Medicine | Admitting: Emergency Medicine

## 2018-02-27 ENCOUNTER — Emergency Department: Payer: Medicare Other

## 2018-02-27 DIAGNOSIS — E039 Hypothyroidism, unspecified: Secondary | ICD-10-CM | POA: Insufficient documentation

## 2018-02-27 DIAGNOSIS — I252 Old myocardial infarction: Secondary | ICD-10-CM | POA: Insufficient documentation

## 2018-02-27 DIAGNOSIS — Z7902 Long term (current) use of antithrombotics/antiplatelets: Secondary | ICD-10-CM | POA: Diagnosis not present

## 2018-02-27 DIAGNOSIS — F17200 Nicotine dependence, unspecified, uncomplicated: Secondary | ICD-10-CM | POA: Insufficient documentation

## 2018-02-27 DIAGNOSIS — M25511 Pain in right shoulder: Secondary | ICD-10-CM | POA: Diagnosis not present

## 2018-02-27 DIAGNOSIS — R002 Palpitations: Secondary | ICD-10-CM | POA: Insufficient documentation

## 2018-02-27 DIAGNOSIS — F319 Bipolar disorder, unspecified: Secondary | ICD-10-CM | POA: Diagnosis not present

## 2018-02-27 DIAGNOSIS — Z9049 Acquired absence of other specified parts of digestive tract: Secondary | ICD-10-CM | POA: Diagnosis not present

## 2018-02-27 DIAGNOSIS — I2510 Atherosclerotic heart disease of native coronary artery without angina pectoris: Secondary | ICD-10-CM | POA: Insufficient documentation

## 2018-02-27 DIAGNOSIS — I10 Essential (primary) hypertension: Secondary | ICD-10-CM | POA: Diagnosis not present

## 2018-02-27 DIAGNOSIS — S299XXA Unspecified injury of thorax, initial encounter: Secondary | ICD-10-CM | POA: Diagnosis not present

## 2018-02-27 DIAGNOSIS — Z8673 Personal history of transient ischemic attack (TIA), and cerebral infarction without residual deficits: Secondary | ICD-10-CM | POA: Diagnosis not present

## 2018-02-27 DIAGNOSIS — Z79899 Other long term (current) drug therapy: Secondary | ICD-10-CM | POA: Insufficient documentation

## 2018-02-27 DIAGNOSIS — R079 Chest pain, unspecified: Secondary | ICD-10-CM | POA: Diagnosis not present

## 2018-02-27 LAB — BASIC METABOLIC PANEL
Anion gap: 7 (ref 5–15)
BUN: 10 mg/dL (ref 8–23)
CO2: 25 mmol/L (ref 22–32)
CREATININE: 0.68 mg/dL (ref 0.44–1.00)
Calcium: 9.2 mg/dL (ref 8.9–10.3)
Chloride: 109 mmol/L (ref 98–111)
GFR calc Af Amer: >60 mL/min (ref >60)
GLUCOSE: 93 mg/dL (ref 70–99)
POTASSIUM: 4.1 mmol/L (ref 3.5–5.1)
Sodium: 141 mmol/L (ref 135–145)

## 2018-02-27 LAB — CBC WITH DIFFERENTIAL/PLATELET
Basophils Absolute: 0 10*3/uL (ref 0–0.1)
Basophils Relative: 0 %
EOS ABS: 0.1 10*3/uL (ref 0–0.7)
EOS PCT: 2 %
HCT: 35 % (ref 35.0–47.0)
Hemoglobin: 11.8 g/dL — ABNORMAL LOW (ref 12.0–16.0)
LYMPHS PCT: 29 %
Lymphs Abs: 1.6 10*3/uL (ref 1.0–3.6)
MCH: 30.4 pg (ref 26.0–34.0)
MCHC: 33.6 g/dL (ref 32.0–36.0)
MCV: 90.6 fL (ref 80.0–100.0)
MONOS PCT: 6 %
Monocytes Absolute: 0.3 10*3/uL (ref 0.2–0.9)
Neutro Abs: 3.4 10*3/uL (ref 1.4–6.5)
Neutrophils Relative %: 63 %
PLATELETS: 172 10*3/uL (ref 150–440)
RBC: 3.87 MIL/uL (ref 3.80–5.20)
RDW: 15 % — ABNORMAL HIGH (ref 11.5–14.5)
WBC: 5.4 10*3/uL (ref 3.6–11.0)

## 2018-02-27 LAB — TROPONIN I
Troponin I: <0.03 ng/mL (ref <0.03)
Troponin I: <0.03 ng/mL (ref <0.03)

## 2018-02-27 MED ORDER — HYDROCODONE-ACETAMINOPHEN 5-325 MG PO TABS: 1.0000 | ORAL_TABLET | ORAL | 0 refills | Status: DC | PRN

## 2018-02-27 MED ORDER — ONDANSETRON HCL 4 MG/2ML IJ SOLN
4.0000 mg | Freq: Once | INTRAMUSCULAR | Status: AC
  Administered 2018-02-27: 4 mg via INTRAVENOUS
  Filled 2018-02-27: qty 2

## 2018-02-27 MED ORDER — HYDROCODONE-ACETAMINOPHEN 5-325 MG PO TABS
2.0000 | ORAL_TABLET | Freq: Once | ORAL | Status: AC
  Administered 2018-02-27: 2 via ORAL
  Filled 2018-02-27: qty 2

## 2018-02-27 MED ORDER — FENTANYL CITRATE (PF) 100 MCG/2ML IJ SOLN
50.0000 ug | Freq: Once | INTRAMUSCULAR | Status: AC
  Administered 2018-02-27: 50 ug via INTRAVENOUS
  Filled 2018-02-27: qty 2
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
## 2018-02-27 NOTE — ED Notes (Signed)
Pt given three peanut butters.

## 2018-02-27 NOTE — ED Notes (Signed)
Patient transported to X-ray 

## 2018-02-27 NOTE — Discharge Instructions (Addendum)
As we discussed please follow-up with cardiology or your primary care doctor to arrange evaluation and Holter monitor placement. Your x-ray did show a what appears to be bony lesion of the right scapula but this does not appear to be an acute lesion but would warrant further work-up with possible CT imaging on an outpatient/nonemergent basis.  Please return to the emergency department for any worsening chest pain, any trouble breathing, or any other symptom personally concerning to yourself.  Otherwise please follow-up with your doctor.

## 2018-03-01 ENCOUNTER — Ambulatory Visit: Payer: Self-pay | Admitting: Adult Health

## 2018-03-13 DIAGNOSIS — E782 Mixed hyperlipidemia: Secondary | ICD-10-CM | POA: Diagnosis not present

## 2018-03-13 DIAGNOSIS — R002 Palpitations: Secondary | ICD-10-CM | POA: Diagnosis not present

## 2018-03-13 DIAGNOSIS — Z720 Tobacco use: Secondary | ICD-10-CM | POA: Diagnosis not present

## 2018-03-13 DIAGNOSIS — I2510 Atherosclerotic heart disease of native coronary artery without angina pectoris: Secondary | ICD-10-CM | POA: Diagnosis not present

## 2018-03-13 DIAGNOSIS — I10 Essential (primary) hypertension: Secondary | ICD-10-CM | POA: Diagnosis not present

## 2018-03-13 DIAGNOSIS — I208 Other forms of angina pectoris: Secondary | ICD-10-CM | POA: Diagnosis not present

## 2018-04-05 DIAGNOSIS — I2510 Atherosclerotic heart disease of native coronary artery without angina pectoris: Secondary | ICD-10-CM | POA: Diagnosis not present

## 2018-04-10 DIAGNOSIS — I2510 Atherosclerotic heart disease of native coronary artery without angina pectoris: Secondary | ICD-10-CM | POA: Diagnosis not present

## 2018-04-10 DIAGNOSIS — E782 Mixed hyperlipidemia: Secondary | ICD-10-CM | POA: Diagnosis not present

## 2018-04-10 DIAGNOSIS — I471 Supraventricular tachycardia: Secondary | ICD-10-CM | POA: Insufficient documentation

## 2018-04-10 DIAGNOSIS — I10 Essential (primary) hypertension: Secondary | ICD-10-CM | POA: Diagnosis not present

## 2018-04-10 DIAGNOSIS — Z720 Tobacco use: Secondary | ICD-10-CM | POA: Diagnosis not present

## 2018-04-11 ENCOUNTER — Encounter: Payer: Self-pay | Admitting: Adult Health

## 2018-04-11 ENCOUNTER — Ambulatory Visit (INDEPENDENT_AMBULATORY_CARE_PROVIDER_SITE_OTHER): Payer: Medicare Other | Admitting: Adult Health

## 2018-04-11 VITALS — BP 110/60 | HR 53 | Resp 16 | Ht 60.0 in | Wt 141.0 lb

## 2018-04-11 DIAGNOSIS — R5383 Other fatigue: Secondary | ICD-10-CM

## 2018-04-11 DIAGNOSIS — W57XXXD Bitten or stung by nonvenomous insect and other nonvenomous arthropods, subsequent encounter: Secondary | ICD-10-CM

## 2018-04-11 DIAGNOSIS — Z1231 Encounter for screening mammogram for malignant neoplasm of breast: Secondary | ICD-10-CM

## 2018-04-11 DIAGNOSIS — I10 Essential (primary) hypertension: Secondary | ICD-10-CM

## 2018-04-11 DIAGNOSIS — Z1239 Encounter for other screening for malignant neoplasm of breast: Secondary | ICD-10-CM

## 2018-04-11 DIAGNOSIS — R079 Chest pain, unspecified: Secondary | ICD-10-CM | POA: Diagnosis not present

## 2018-04-11 DIAGNOSIS — Z0001 Encounter for general adult medical examination with abnormal findings: Secondary | ICD-10-CM

## 2018-04-11 NOTE — Progress Notes (Signed)
[facility] [place], [place] [address]  Internal MEDICINE  Office Visit Note  Patient Name: Carol [name]  [id number]  [id number]  Date of Service: 04/28/2018  Chief Complaint  Patient presents with  . Hypertension  . Medicare Wellness  . Quality Metric Gaps    mammogram    HPI Pt is here for routine health maintenance examination.  She has a history of CAD, anxiety, depression, HLD and bipolar disorder. Pt was seen yesterday at [facility] for appointment to prepare for cardiac cath two days from now. She had a stress test and is possibly in need of stenting.  She is being followed by Dr. Ubaldo Glassing.  She generally feeling well. She reports she has had some fatigue, but she is convinced that is from the episodes of SVT.     Current Medication: Outpatient Encounter Medications as of 04/11/2018  Medication Sig Note  . albuterol (PROVENTIL HFA;VENTOLIN HFA) 108 (90 BASE) MCG/ACT inhaler Inhale 2 puffs into the lungs every 6 (six) hours as needed for wheezing or shortness of breath.   Marland Kitchen albuterol (PROVENTIL) (2.5 MG/3ML) 0.083% nebulizer solution USE 1 VIAL VIA NEBULIZER  EVERY 4 TO 6 HOURS AS  NEEDED (Patient taking differently: Take 2.5 mg by nebulization every 4 (four) hours as needed for wheezing or shortness of breath. )   . calcium-vitamin D (OSCAL-500) 500-400 MG-UNIT tablet Take 1 tablet by mouth daily.    . cetirizine (ZYRTEC) 10 MG tablet Take 10 mg by mouth daily.    . citalopram (CELEXA) 40 MG tablet TAKE 1 TABLET BY MOUTH  DAILY   . clopidogrel (PLAVIX) 75 MG tablet Take 75 mg by mouth daily.    Marland Kitchen diltiazem (CARDIZEM) 30 MG tablet Take 30 mg by mouth daily as needed (rapid heart beat).   . fluticasone (FLONASE) 50 MCG/ACT nasal spray USE 1 SPRAY IN EACH NOSTRIL TWO TIMES DAILY (Patient taking differently: Place 1 spray into both nostrils daily. )   . isosorbide mononitrate (IMDUR) 30 MG 24 hr tablet Take 30 mg by mouth daily.   Marland Kitchen levothyroxine (SYNTHROID,  LEVOTHROID) 100 MCG tablet TAKE 1 TABLET BY MOUTH  DAILY   . lisinopril (PRINIVIL,ZESTRIL) 10 MG tablet Take 10 mg by mouth daily.   . metoprolol tartrate (LOPRESSOR) 25 MG tablet Take 25 mg by mouth 2 (two) times daily.   . nitroGLYCERIN (NITROSTAT) 0.4 MG SL tablet Place 0.4 mg under the tongue every 5 (five) minutes as needed for chest pain.   . pantoprazole (PROTONIX) 40 MG tablet TAKE 1 TABLET BY MOUTH  DAILY   . simethicone (GAS-X) 80 MG chewable tablet Chew 1 tablet (80 mg total) by mouth 4 (four) times daily as needed for flatulence.   . simvastatin (ZOCOR) 20 MG tablet Take 1 tablet (20 mg total) by mouth at bedtime.   . [DISCONTINUED] lisinopril (PRINIVIL,ZESTRIL) 10 MG tablet Take by mouth.   . [DISCONTINUED] ondansetron (ZOFRAN ODT) 4 MG disintegrating tablet Take 1 tablet (4 mg total) by mouth every 8 (eight) hours as needed for nausea or vomiting. (Patient not taking: Reported on 04/13/2018)   . saccharomyces boulardii (FLORASTOR) 250 MG capsule Take 1 capsule (250 mg total) by mouth 2 (two) times daily. (Patient not taking: Reported on 04/10/2018)   . [DISCONTINUED] HYDROcodone-acetaminophen (NORCO/VICODIN) 5-325 MG tablet Take 1 tablet by mouth every 4 (four) hours as needed. (Patient not taking: Reported on 04/10/2018)   . [DISCONTINUED] lisinopril (PRINIVIL,ZESTRIL) 5 MG tablet TAKE 1 TABLET BY  MOUTH  DAILY (Patient not taking: No sig reported) 04/10/2018: Now using 69m tablet  . [DISCONTINUED] methocarbamol (ROBAXIN) 500 MG tablet Take 1 tablet (500 mg total) by mouth 3 (three) times daily. (Patient not taking: Reported on 04/26/2017)    No facility-administered encounter medications on file as of 04/11/2018.     Surgical History: Past Surgical History:  Procedure Laterality Date  . ABDOMINAL HYSTERECTOMY    . APPENDECTOMY    . CARDIAC CATHETERIZATION Left 09/26/2015   Procedure: Left Heart Cath and Coronary Angiography;  Surgeon: KTeodoro Spray MD;  [facility];  Service: Cardiovascular;  Laterality: Left;  . CHOLECYSTECTOMY N/A 12/22/2016   Procedure: LAPAROSCOPIC CHOLECYSTECTOMY WITH INTRAOPERATIVE CHOLANGIOGRAM;  Surgeon: TJovita Kussmaul MD;  [facility]  Service: General;  Laterality: N/A;  . CORONARY STENT INTERVENTION N/A 04/13/2018   Procedure: CORONARY STENT INTERVENTION;  Surgeon: CYolonda Kida MD;  [facility];  Service: Cardiovascular;  Laterality: N/A;  . ERCP N/A 12/20/2016   Procedure: ENDOSCOPIC RETROGRADE CHOLANGIOPANCREATOGRAPHY (ERCP);  Surgeon: HTeena Irani MD;  [facility];  Service: Endoscopy;  Laterality: N/A;  . HYSTEROTOMY    . LEFT HEART CATH AND CORONARY ANGIOGRAPHY Left 04/13/2018   Procedure: LEFT HEART CATH AND CORONARY ANGIOGRAPHY;  Surgeon: FTeodoro Spray MD;  [facility];  Service: Cardiovascular;  Laterality: Left;    Medical History: Past Medical History:  Diagnosis Date  . Bipolar affective disorder ([facility]   . Coronary artery disease   . Hyperlipidemia   . Hypertension   . Hypothyroid   . Myocardial infarction ([facility]   . Stroke ([facility] 11/15/2016   "right side weaker since" (12/20/2016)    Family History: Family History  Problem Relation Age of Onset  . CAD Unknown       Review of Systems  Constitutional: Negative for chills, fatigue and unexpected weight change.  HENT: Negative for congestion, rhinorrhea, sneezing and sore throat.   Eyes: Negative for photophobia, pain and redness.  Respiratory: Negative for cough, chest tightness and shortness of breath.   Cardiovascular: Negative for chest pain and palpitations.  Gastrointestinal: Negative for abdominal pain, constipation, diarrhea, nausea and vomiting.  Endocrine: Negative.   Genitourinary: Negative for dysuria and frequency.  Musculoskeletal: Negative for arthralgias, back pain, joint swelling and neck pain.  Skin: Negative for rash.  Allergic/Immunologic: Negative.   Neurological:  Negative for tremors and numbness.  Hematological: Negative for adenopathy. Does not bruise/bleed easily.  Psychiatric/Behavioral: Negative for behavioral problems and sleep disturbance. The patient is not nervous/anxious.    Vital Signs: BP 110/60   Pulse (!) 53   Resp 16   Ht 5' (1.524 m)   Wt 141 lb (64 kg)   SpO2 96%   BMI 27.54 kg/m   Physical Exam  Constitutional: She is oriented to person, place, and time. She appears well-developed and well-nourished. No distress.  HENT:  Head: Normocephalic and atraumatic.  Mouth/Throat: Oropharynx is clear and moist. No oropharyngeal exudate.  Eyes: Pupils are equal, round, and reactive to light. EOM are normal.  Neck: Normal range of motion. Neck supple. No JVD present. No tracheal deviation present. No thyromegaly present.  Cardiovascular: Normal rate, regular rhythm and normal heart sounds. Exam reveals no gallop and no friction rub.  No murmur heard. Pulmonary/Chest: Effort normal and breath sounds normal. No respiratory distress. She has no wheezes. She has no rales. She exhibits no tenderness.  Abdominal: Soft. There is no tenderness.  There is no guarding.  Musculoskeletal: Normal range of motion.  Lymphadenopathy:    She has no cervical adenopathy.  Neurological: She is alert and oriented to person, place, and time. No cranial nerve deficit.  Skin: Skin is warm and dry. She is not diaphoretic.  Psychiatric: She has a normal mood and affect. Her behavior is normal. Judgment and thought content normal.  Nursing note and vitals reviewed.  LABS: Recent Results (from the past 2160 hour(s))  CBC with Differential     Status: Abnormal   Collection Time: 02/27/18 12:08 PM  Result Value Ref Range   WBC 5.4 3.6 - 11.0 K/uL   RBC 3.87 3.80 - 5.20 MIL/uL   Hemoglobin 11.8 (L) 12.0 - 16.0 g/dL   HCT 35.0 35.0 - 47.0 %   MCV 90.6 80.0 - 100.0 fL   MCH 30.4 26.0 - 34.0 pg   MCHC 33.6 32.0 - 36.0 g/dL   RDW 15.0 (H) 11.5 - 14.5 %    Platelets 172 150 - 440 K/uL   Neutrophils Relative % 63 %   Neutro Abs 3.4 1.4 - 6.5 K/uL   Lymphocytes Relative 29 %   Lymphs Abs 1.6 1.0 - 3.6 K/uL   Monocytes Relative 6 %   Monocytes Absolute 0.3 0.2 - 0.9 K/uL   Eosinophils Relative 2 %   Eosinophils Absolute 0.1 0 - 0.7 K/uL   Basophils Relative 0 %   Basophils Absolute 0.0 0 - 0.1 K/uL    Comment: Performed at [facility], [address] [place], [place] [address]  Basic metabolic panel     Status: None   Collection Time: 02/27/18 12:08 PM  Result Value Ref Range   Sodium 141 135 - 145 mmol/L   Potassium 4.1 3.5 - 5.1 mmol/L   Chloride 109 98 - 111 mmol/L    Comment: Please note change in reference range.   CO2 25 22 - 32 mmol/L   Glucose, Bld 93 70 - 99 mg/dL    Comment: Please note change in reference range.   BUN 10 8 - 23 mg/dL    Comment: Please note change in reference range.   Creatinine, Ser 0.68 0.44 - 1.00 mg/dL   Calcium 9.2 8.9 - 10.3 mg/dL   GFR calc non Af Amer >60 >60 mL/min   GFR calc Af Amer >60 >60 mL/min    Comment: (NOTE) The eGFR has been calculated using the CKD EPI equation. This calculation has not been validated in all clinical situations. eGFR's persistently <60 mL/min signify possible Chronic Kidney Disease.    Anion gap 7 5 - 15    Comment: Performed at [facility], [address] [place], [place] [address]  Troponin I     Status: None   Collection Time: 02/27/18 12:08 PM  Result Value Ref Range   Troponin I <0.03 <0.03 ng/mL    Comment: Performed at [facility], [address] [place], [place] [address]  Troponin I     Status: None   Collection Time: 02/27/18  2:52 PM  Result Value Ref Range   Troponin I <0.03 <0.03 ng/mL    Comment: Performed at [facility], [address] [place], [place] [address]  UA/M w/rflx Culture, Routine     Status: Abnormal   Collection Time: 04/11/18  9:48 AM  Result Value Ref Range   Specific Gravity, UA  1.019 1.005 - 1.030   pH, UA 5.0 5.0 - 7.5   Color, UA  Yellow Yellow   Appearance Ur Cloudy (A) Clear   Leukocytes, UA Negative Negative   Protein, UA Negative Negative/Trace   Glucose, UA Negative Negative   Ketones, UA Negative Negative   RBC, UA Negative Negative   Bilirubin, UA Negative Negative   Urobilinogen, Ur 0.2 0.2 - 1.0 mg/dL   Nitrite, UA Negative Negative   Microscopic Examination Comment     Comment: Microscopic follows if indicated.   Microscopic Examination See below:     Comment: Microscopic was indicated and was performed.   Urinalysis Reflex Comment     Comment: This specimen will not reflex to a Urine Culture.  Microscopic Examination     Status: None   Collection Time: 04/11/18  9:48 AM  Result Value Ref Range   WBC, UA 0-5 0 - 5 /hpf   RBC, UA 0-2 0 - 2 /hpf   Epithelial Cells (non renal) 0-10 0 - 10 /hpf   Casts None seen None seen /lpf   Mucus, UA Present Not Estab.   Bacteria, UA None seen None seen/Few  POCT Activated clotting time     Status: None   Collection Time: 04/13/18 10:05 AM  Result Value Ref Range   Activated Clotting Time 356 seconds   Assessment/Plan: 1. Encounter for general adult medical examination with abnormal findings Will follow labs and treat accordingly - Lipid Panel With LDL/HDL Ratio - TSH - T4, free  2. Tick bite, subsequent encounter Pt finding multiple ticks recently.   - Lyme Disease, IgM, Early Test w/ Rflx - Rocky mtn spotted fvr ab, IgG-blood  3. Chest pain, unspecified type Pt followed by Dr. Ubaldo Glassing, will have cath in two days.  She has recorded SVT with ST depression, and he is working that up.  4. Fatigue, unspecified type Pt has been fatigued for a few weeks. Possibly related to her arhythmia.   - B12 and Folate Panel - Vitamin D 1,25 dihydroxy - Fe+TIBC+Fer  5. Essential hypertension Controlled at this time on two agents.  Continue medications as directed.   6. Screening for breast cancer - MM  DIGITAL SCREENING BILATERAL; Future  General Counseling: araseli sherry understanding of the findings of todays visit and agrees with plan of treatment. I have discussed any further diagnostic evaluation that may be needed or ordered today. We also reviewed her medications today. she has been encouraged to call the office with any questions or concerns that should arise related to todays visit.   Orders Placed This Encounter  Procedures  . Microscopic Examination  . MM DIGITAL SCREENING BILATERAL  . Lipid Panel With LDL/HDL Ratio  . TSH  . T4, free  . Lyme Disease, IgM, Early Test w/ Rflx  . B12 and Folate Panel  . Vitamin D 1,25 dihydroxy  . Fe+TIBC+Fer  . Rocky mtn spotted fvr ab, IgG-blood  . UA/M w/rflx Culture, Routine    Time spent: 30 Minutes   This patient was seen by Orson Gear AGNP-C in Collaboration with Dr Lavera Guise as a part of collaborative care agreement   Lavera Guise, MD  Internal Medicine

## 2018-04-11 NOTE — Patient Instructions (Signed)

## 2018-04-12 ENCOUNTER — Other Ambulatory Visit: Payer: Self-pay | Admitting: Cardiology

## 2018-04-12 LAB — UA/M W/RFLX CULTURE, ROUTINE
Bilirubin, UA: NEGATIVE
Glucose, UA: NEGATIVE
Ketones, UA: NEGATIVE
Leukocytes, UA: NEGATIVE
Nitrite, UA: NEGATIVE
PH UA: 5 (ref 5.0–7.5)
PROTEIN UA: NEGATIVE
RBC, UA: NEGATIVE
SPEC GRAV UA: 1.019 (ref 1.005–1.030)
Urobilinogen, Ur: 0.2 mg/dL (ref 0.2–1.0)

## 2018-04-12 LAB — MICROSCOPIC EXAMINATION
Bacteria, UA: NONE SEEN
CASTS: NONE SEEN /LPF

## 2018-04-13 ENCOUNTER — Encounter
Admission: RE | Disposition: A | Discharge status: Home / Self Care | Payer: Self-pay | Source: Ambulatory Visit | Attending: Cardiology

## 2018-04-13 ENCOUNTER — Encounter: Payer: Self-pay | Admitting: *Deleted

## 2018-04-13 ENCOUNTER — Other Ambulatory Visit: Payer: Self-pay

## 2018-04-13 ENCOUNTER — Observation Stay
Admission: RE | Admit: 2018-04-13 | Discharge: 2018-04-14 | Disposition: A | Discharge status: Home / Self Care | Payer: Medicare Other | Source: Ambulatory Visit | Attending: Cardiology | Admitting: Cardiology

## 2018-04-13 DIAGNOSIS — G629 Polyneuropathy, unspecified: Secondary | ICD-10-CM | POA: Insufficient documentation

## 2018-04-13 DIAGNOSIS — Z79899 Other long term (current) drug therapy: Secondary | ICD-10-CM | POA: Insufficient documentation

## 2018-04-13 DIAGNOSIS — M1990 Unspecified osteoarthritis, unspecified site: Secondary | ICD-10-CM | POA: Diagnosis not present

## 2018-04-13 DIAGNOSIS — Z886 Allergy status to analgesic agent status: Secondary | ICD-10-CM | POA: Diagnosis not present

## 2018-04-13 DIAGNOSIS — Z885 Allergy status to narcotic agent status: Secondary | ICD-10-CM | POA: Insufficient documentation

## 2018-04-13 DIAGNOSIS — E782 Mixed hyperlipidemia: Secondary | ICD-10-CM | POA: Diagnosis not present

## 2018-04-13 DIAGNOSIS — I25119 Atherosclerotic heart disease of native coronary artery with unspecified angina pectoris: Secondary | ICD-10-CM | POA: Diagnosis not present

## 2018-04-13 DIAGNOSIS — F329 Major depressive disorder, single episode, unspecified: Secondary | ICD-10-CM | POA: Diagnosis not present

## 2018-04-13 DIAGNOSIS — Z79890 Hormone replacement therapy (postmenopausal): Secondary | ICD-10-CM | POA: Diagnosis not present

## 2018-04-13 DIAGNOSIS — I471 Supraventricular tachycardia: Secondary | ICD-10-CM | POA: Diagnosis not present

## 2018-04-13 DIAGNOSIS — I252 Old myocardial infarction: Secondary | ICD-10-CM | POA: Insufficient documentation

## 2018-04-13 DIAGNOSIS — Z8673 Personal history of transient ischemic attack (TIA), and cerebral infarction without residual deficits: Secondary | ICD-10-CM | POA: Insufficient documentation

## 2018-04-13 DIAGNOSIS — R0789 Other chest pain: Secondary | ICD-10-CM | POA: Diagnosis present

## 2018-04-13 DIAGNOSIS — I2510 Atherosclerotic heart disease of native coronary artery without angina pectoris: Secondary | ICD-10-CM | POA: Diagnosis not present

## 2018-04-13 DIAGNOSIS — R079 Chest pain, unspecified: Secondary | ICD-10-CM

## 2018-04-13 DIAGNOSIS — F17210 Nicotine dependence, cigarettes, uncomplicated: Secondary | ICD-10-CM | POA: Insufficient documentation

## 2018-04-13 DIAGNOSIS — I200 Unstable angina: Secondary | ICD-10-CM | POA: Diagnosis not present

## 2018-04-13 DIAGNOSIS — Z882 Allergy status to sulfonamides status: Secondary | ICD-10-CM | POA: Diagnosis not present

## 2018-04-13 DIAGNOSIS — Z955 Presence of coronary angioplasty implant and graft: Secondary | ICD-10-CM

## 2018-04-13 DIAGNOSIS — E079 Disorder of thyroid, unspecified: Secondary | ICD-10-CM | POA: Insufficient documentation

## 2018-04-13 DIAGNOSIS — Z7902 Long term (current) use of antithrombotics/antiplatelets: Secondary | ICD-10-CM | POA: Insufficient documentation

## 2018-04-13 DIAGNOSIS — I10 Essential (primary) hypertension: Secondary | ICD-10-CM | POA: Diagnosis not present

## 2018-04-13 HISTORY — PX: LEFT HEART CATH AND CORONARY ANGIOGRAPHY: CATH118249

## 2018-04-13 HISTORY — PX: CORONARY STENT INTERVENTION: CATH118234

## 2018-04-13 LAB — POCT ACTIVATED CLOTTING TIME: Activated Clotting Time: 356 seconds

## 2018-04-13 SURGERY — LEFT HEART CATH AND CORONARY ANGIOGRAPHY
Anesthesia: Moderate Sedation

## 2018-04-13 MED ORDER — SODIUM CHLORIDE 0.9 % IV SOLN: 250.0000 mL | INTRAVENOUS | Status: DC | PRN

## 2018-04-13 MED ORDER — LABETALOL HCL 5 MG/ML IV SOLN: 10.0000 mg | INTRAVENOUS | Status: AC | PRN

## 2018-04-13 MED ORDER — SODIUM CHLORIDE 0.9 % IV SOLN
0.2500 mg/kg/h | INTRAVENOUS | Status: AC
  Filled 2018-04-13: qty 250

## 2018-04-13 MED ORDER — SODIUM CHLORIDE 0.9 % IV SOLN
INTRAVENOUS | Status: AC | PRN
  Administered 2018-04-13: 1.75 mg/kg/h via INTRAVENOUS

## 2018-04-13 MED ORDER — SODIUM CHLORIDE 0.9 % WEIGHT BASED INFUSION
1.0000 mL/kg/h | INTRAVENOUS | Status: AC
  Administered 2018-04-13: 1 mL/kg/h via INTRAVENOUS

## 2018-04-13 MED ORDER — FENTANYL CITRATE (PF) 100 MCG/2ML IJ SOLN
INTRAMUSCULAR | Status: AC
  Filled 2018-04-13: qty 2

## 2018-04-13 MED ORDER — SODIUM CHLORIDE 0.9 % WEIGHT BASED INFUSION
3.0000 mL/kg/h | INTRAVENOUS | Status: AC
  Administered 2018-04-13: 3 mL/kg/h via INTRAVENOUS

## 2018-04-13 MED ORDER — ACETAMINOPHEN 325 MG PO TABS
650.0000 mg | ORAL_TABLET | ORAL | Status: DC | PRN
  Administered 2018-04-13: 650 mg via ORAL

## 2018-04-13 MED ORDER — ASPIRIN 81 MG PO CHEW: 81.0000 mg | CHEWABLE_TABLET | ORAL | Status: DC

## 2018-04-13 MED ORDER — SODIUM CHLORIDE 0.9% FLUSH: 3.0000 mL | INTRAVENOUS | Status: DC | PRN

## 2018-04-13 MED ORDER — CLOPIDOGREL BISULFATE 75 MG PO TABS
75.0000 mg | ORAL_TABLET | Freq: Every day | ORAL | Status: DC
  Administered 2018-04-14: 75 mg via ORAL
  Filled 2018-04-13: qty 1

## 2018-04-13 MED ORDER — CLOPIDOGREL BISULFATE 75 MG PO TABS
ORAL_TABLET | ORAL | Status: DC | PRN
  Administered 2018-04-13: 300 mg via ORAL

## 2018-04-13 MED ORDER — MIDAZOLAM HCL 2 MG/2ML IJ SOLN
INTRAMUSCULAR | Status: AC
  Filled 2018-04-13: qty 2

## 2018-04-13 MED ORDER — HYDRALAZINE HCL 20 MG/ML IJ SOLN: 5.0000 mg | INTRAMUSCULAR | Status: AC | PRN

## 2018-04-13 MED ORDER — BIVALIRUDIN TRIFLUOROACETATE 250 MG IV SOLR
INTRAVENOUS | Status: AC
  Filled 2018-04-13: qty 250

## 2018-04-13 MED ORDER — IOPAMIDOL (ISOVUE-300) INJECTION 61%
INTRAVENOUS | Status: DC | PRN
  Administered 2018-04-13: 150 mL via INTRAVENOUS
  Administered 2018-04-13: 65 mL via INTRAVENOUS

## 2018-04-13 MED ORDER — SODIUM CHLORIDE 0.9% FLUSH: 3.0000 mL | Freq: Two times a day (BID) | INTRAVENOUS | Status: DC

## 2018-04-13 MED ORDER — CLOPIDOGREL BISULFATE 300 MG PO TABS
ORAL_TABLET | ORAL | Status: AC
  Filled 2018-04-13: qty 1

## 2018-04-13 MED ORDER — SODIUM CHLORIDE 0.9 % WEIGHT BASED INFUSION: 1.0000 mL/kg/h | INTRAVENOUS | Status: DC

## 2018-04-13 MED ORDER — ONDANSETRON HCL 4 MG/2ML IJ SOLN: 4.0000 mg | Freq: Four times a day (QID) | INTRAMUSCULAR | Status: DC | PRN

## 2018-04-13 MED ORDER — NITROGLYCERIN 5 MG/ML IV SOLN
INTRAVENOUS | Status: AC
  Filled 2018-04-13: qty 10

## 2018-04-13 MED ORDER — BIVALIRUDIN BOLUS VIA INFUSION - CUPID
INTRAVENOUS | Status: DC | PRN
  Administered 2018-04-13: 48 mg via INTRAVENOUS

## 2018-04-13 SURGICAL SUPPLY — 17 items
BALLN TREK RX 2.5X20 (BALLOONS) ×3
BALLOON TREK RX 2.5X20 (BALLOONS) ×2 IMPLANT
CATH INFINITI 5FR ANG PIGTAIL (CATHETERS) ×3 IMPLANT
CATH INFINITI 5FR JL4 (CATHETERS) ×3 IMPLANT
CATH INFINITI JR4 5F (CATHETERS) ×3 IMPLANT
CATH VISTA GUIDE 6FR JR4 SH (CATHETERS) ×3 IMPLANT
DEVICE CLOSURE MYNXGRIP 6/7F (Vascular Products) ×3 IMPLANT
DEVICE INFLAT 30 PLUS (MISCELLANEOUS) ×3 IMPLANT
DEVICE SAFEGUARD 24CM (GAUZE/BANDAGES/DRESSINGS) ×3 IMPLANT
KIT MANI 3VAL PERCEP (MISCELLANEOUS) ×3 IMPLANT
NEEDLE PERC 18GX7CM (NEEDLE) ×3 IMPLANT
PACK CARDIAC CATH (CUSTOM PROCEDURE TRAY) ×3 IMPLANT
SHEATH AVANTI 5FR X 11CM (SHEATH) ×3 IMPLANT
SHEATH AVANTI 6FR X 11CM (SHEATH) ×3 IMPLANT
STENT SIERRA 2.75 X 38 MM (Permanent Stent) ×3 IMPLANT
WIRE G HI TQ BMW 190 (WIRE) ×3 IMPLANT
WIRE GUIDERIGHT .035X150 (WIRE) ×3 IMPLANT

## 2018-04-13 NOTE — Progress Notes (Signed)
Pt complains of throbbing in right groin cath site. Site remains clear from hematoma, no bleeding noted. PAD still intact. RN will give prn tylenol and decrease air in PAD by 10cc per hour until deflated. RN will remove PAD if no bleeding or oozing noted. I will continue to assess.

## 2018-04-13 NOTE — Care Management Obs Status (Signed)
MEDICARE OBSERVATION STATUS NOTIFICATION   Patient Details  Name: Carol [name] MRN: [id number] Date of Birth: 07-May-1956   Medicare Observation Status Notification Given:  Yes    Elza Rafter, RN 04/13/2018, 4:12 PM

## 2018-04-13 NOTE — Progress Notes (Signed)
Post PCI 12 lead EKG done . Pt. Denies c/o CP,SOB, N/V, dizziness, HA, leg discomfort. Right groin intact with PAD.  No hematoma, edema, ecchymosis, drainage. Dr. Clayborn Bigness spoke with pt. Husband just before pt. Came to recovery. No acute distress on arrival.

## 2018-04-13 NOTE — Progress Notes (Signed)
Pt admitted from cath lab s/p cardiac cath with intervention. Right groin site has PAD applied, no hematoma or bleeding noted at site, pulses are positive bilaterally. Pt has no complaints of pain at this time. I will continue to assess.

## 2018-04-13 NOTE — Progress Notes (Signed)
Remainder of air removed from PAD. PAD removed per policy. Gauze and occlusive dressing placed. No hematoma or bleeding from site noted. I will continue to assess.

## 2018-04-13 NOTE — H&P (Signed)
Chief Complaint: Chief Complaint  Patient presents with  . Follow-up  stress and echo  Date of Service: 04/10/2018 Date of Birth: August 30, 1955 PCP: Lavera Guise, MD  History of Present Illness: Ms. Dunsmore is a 62 y.o.female patient with past medical history significant for coronary artery disease with a chronically occluded RCA per cardiac cath in 2017, hypertension, hyperlipidemia, and tobacco use who presents to review stress test, echocardiogram, and Holter monitor results. Since she was last seen in the office, patient reports daily exertional left-sided chest pain that radiates to her left arm. No radiation to jaw or associated diaphoresis. Also admits to worsening fatigue and shortness of breath with exertion. Reports a heart racing sensation with associated dizziness that can happen as frequently as once or twice daily. Has chronic mild lower extremity swelling in which she manages with a low-sodium diet and multiple pairs of socks. Patient continues to smoke but has reduced use to 1/2 pack/day.  We discussed in detail the results of the stress test, echocardiogram, and Holter monitor results. Exercise-induced ST depressions were noted on ECG during stress test. Echocardiogram demonstrated normal left ventricular systolic function with an EF estimated at 60% with mild LVH, mild TR and mild MR. 48-hour Holter monitor revealed predominantly sinus rhythm with rare PACs. A 12 beat run of SVT at 112 bpm was also noted. Past Medical and Surgical History  Past Medical History Past Medical History:  Diagnosis Date  . Anemia  . Arthritis  . CAD (coronary artery disease)  occluded RCA treated medically  . Depression  . Hyperlipidemia  . Hypertension  . Migraine headache  . Myocardial infarction (CMS-HCC)  . 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 Stroke (CMS-HCC)  . Thyroid disease  . Tremor   Past Surgical History She has no past surgical history on file.    Medications and Allergies  Current Medications  Current Outpatient Medications  Medication Sig Dispense Refill  . albuterol 2.5 mg /3 mL (0.083 %) Nebu 3 mL, albuterol 5 mg/mL Nebu 0.5 mL aerosol nebs Inhale 5 mg into the lungs as needed.  . cetirizine (ZYRTEC) 10 MG tablet Take by mouth.  . clopidogrel (PLAVIX) 75 mg tablet Take 1 tablet (75 mg total) by mouth once daily 90 tablet 3  . isosorbide mononitrate (IMDUR) 30 MG ER tablet TAKE 1 TABLET BY MOUTH ONCE DAILY 90 tablet 2  . isosorbide mononitrate (IMDUR) 30 MG ER tablet Take 1 tablet (30 mg total) by mouth once daily 90 tablet 3  . levothyroxine (SYNTHROID, LEVOTHROID) 100 MCG tablet Take 100 mcg by mouth once daily. Take on an empty stomach with a glass of water at least 30-60 minutes before breakfast.  . lisinopril (PRINIVIL,ZESTRIL) 10 MG tablet Take 1 tablet (10 mg total) by mouth once daily 30 tablet 11  . lubiprostone (AMITIZA) 8 MCG capsule Take by mouth as needed.   . metoprolol tartrate (LOPRESSOR) 25 MG tablet Take 1 tablet (25 mg total) by mouth 2 (two) times daily 180 tablet 3  . pantoprazole (PROTONIX) 40 MG DR tablet TK 1 T PO D 0  . simvastatin (ZOCOR) 20 MG tablet Take 1 tablet (20 mg total) by mouth nightly 90 tablet 3  . diltiazem (CARDIZEM) 30 MG tablet Take 1 tablet (30 mg total) by mouth 4 (four) times daily as needed 30 tablet 11  . nitroGLYcerin (NITROSTAT) 0.4 MG SL tablet Place 1 tablet (0.4 mg total) under the tongue every 5 (five) minutes as needed  for Chest pain May take up to 3 doses. 25 tablet 1   No current facility-administered medications for this visit.   Allergies: Sulfa (sulfonamide antibiotics); Aspir-low [aspirin]; Lyrica [pregabalin]; Morphine; Septra [sulfamethoxazole-trimethoprim]; and Vioxx [rofecoxib]  Social and Family History  Social History reports that she has been smoking cigarettes. She has been smoking about 1.00 pack per day. She has never used smokeless tobacco. She reports that  she does not drink alcohol or use drugs.  Family History No family history on file.  Review of Systems   Review of Systems: The patient denies chest pain, shortness of breath, orthopnea, paroxysmal nocturnal dyspnea, pedal edema, palpitations, heart racing, fatigue, dizziness, lightheadedness, presyncope, syncope, leg pain, leg cramping. Review of 12 Systems is negative except as described in HPI.   Physical Examination   Vitals:BP 110/64  Pulse 60  Resp 16  Ht 152.4 cm (5')  Wt 64 kg (141 lb 1.5 oz)  BMI 27.56 kg/m  Ht:152.4 cm (5') Wt:64 kg (141 lb 1.5 oz) FOY:DXAJ surface area is 1.65 meters squared. Body mass index is 27.56 kg/m.  General: Well developed, well nourished. Tearful, in mild distress  HEENT: Pupils equally reactive to light and accomodation  Neck: Supple without thyromegaly, or goiter. Carotid pulses 2+. No carotid bruits present.  Pulmonary: Clear to auscultation bilaterally; no wheezes, rales, rhonchi Cardiovascular: Regular rate and rhythm. No gallops, murmurs or rubs Gastrointestinal: Soft nontender, nondistended, with normal bowel sounds Extremities: No cyanosis or clubbing. Mild peripheral edema noted bilaterally Peripheral Pulses: 2+ in upper extremities, 2+ in lower extremities  Neurology: Alert and oriented X3 Pysch: Good affect. Responds appropriately  Assessment   62 y.o. female with  1. Coronary artery disease involving native coronary artery of native heart without angina pectoris  2. Essential hypertension  3. SVT (supraventricular tachycardia) (CMS-HCC)  4. Mixed hyperlipidemia  5. Tobacco use   Plan  1. Coronary artery disease with chronically occluded RCA - With abnormal stress test results and persistent symptoms, patient scheduled for cardiac catheterization - Continue with isosorbide mononitrate 30 mg daily; sublingual nitro ordered at today's visit 2. Paroxysmal supraventricular tachycardia - Continue with metoprolol 25 mg twice  daily - Diltiazem 30 mg ordered at today's visit to use for breakthrough arrhythmias - Will continue to monitor  3. Essential hypertension - Continue current medication regimen of lisinopril 10 mg once daily and metoprolol 25 mg twice daily - Instructed to monitor for symptomatic hypotension or bradycardia  - Continue low sodium diet 2000mg  daily  4. Mixed hyperlipidemia - Continue simvastatin 20 mg once nightly - LDL goal <70 5. Tobacco use - Strongly encouraged smoking cessation  Orders Placed This Encounter  Procedures  . Basic Metabolic Panel (BMP)  . CBC w/auto Differential (5 Part)   Return After cath .  I personally performed the service, non-incident to. (WP)  NICOLE LYNN STEPHENS, PA  Pt seen and examined. No change from above.

## 2018-04-13 NOTE — Progress Notes (Signed)
Angiomax gtt. Off now per orders (2 hrs. Post end of PCI). Pt. Unable to void using external female catheter. Order received for I & O cath. 350 ml clear yellow UOP. Pt. Tolerated well. Right groin remains clean, dry, intact with PAD. No complications at site.

## 2018-04-14 ENCOUNTER — Encounter: Payer: Self-pay | Admitting: Neurology

## 2018-04-14 DIAGNOSIS — I25119 Atherosclerotic heart disease of native coronary artery with unspecified angina pectoris: Secondary | ICD-10-CM | POA: Diagnosis not present

## 2018-04-14 DIAGNOSIS — E079 Disorder of thyroid, unspecified: Secondary | ICD-10-CM | POA: Diagnosis not present

## 2018-04-14 DIAGNOSIS — Z886 Allergy status to analgesic agent status: Secondary | ICD-10-CM | POA: Diagnosis not present

## 2018-04-14 DIAGNOSIS — I10 Essential (primary) hypertension: Secondary | ICD-10-CM | POA: Diagnosis not present

## 2018-04-14 DIAGNOSIS — M1990 Unspecified osteoarthritis, unspecified site: Secondary | ICD-10-CM | POA: Diagnosis not present

## 2018-04-14 DIAGNOSIS — Z79899 Other long term (current) drug therapy: Secondary | ICD-10-CM | POA: Diagnosis not present

## 2018-04-14 DIAGNOSIS — Z7902 Long term (current) use of antithrombotics/antiplatelets: Secondary | ICD-10-CM | POA: Diagnosis not present

## 2018-04-14 DIAGNOSIS — Z8673 Personal history of transient ischemic attack (TIA), and cerebral infarction without residual deficits: Secondary | ICD-10-CM | POA: Diagnosis not present

## 2018-04-14 DIAGNOSIS — Z955 Presence of coronary angioplasty implant and graft: Secondary | ICD-10-CM | POA: Diagnosis not present

## 2018-04-14 DIAGNOSIS — Z882 Allergy status to sulfonamides status: Secondary | ICD-10-CM | POA: Diagnosis not present

## 2018-04-14 DIAGNOSIS — E782 Mixed hyperlipidemia: Secondary | ICD-10-CM | POA: Diagnosis not present

## 2018-04-14 DIAGNOSIS — Z885 Allergy status to narcotic agent status: Secondary | ICD-10-CM | POA: Diagnosis not present

## 2018-04-14 DIAGNOSIS — G629 Polyneuropathy, unspecified: Secondary | ICD-10-CM | POA: Diagnosis not present

## 2018-04-14 DIAGNOSIS — I471 Supraventricular tachycardia: Secondary | ICD-10-CM | POA: Diagnosis not present

## 2018-04-14 DIAGNOSIS — I252 Old myocardial infarction: Secondary | ICD-10-CM | POA: Diagnosis not present

## 2018-04-14 NOTE — Progress Notes (Signed)
Discharge instructions given to patient. Verbalized understanding. No acute distress at this time. Ride is at bedside to transport patient home.

## 2018-04-14 NOTE — Final Progress Note (Signed)
Physician Final Progress Note  Patient ID: Carol [name] MRN: [id number] DOB/AGE: 1956/05/25 62 y.o.  Admit date: 04/13/2018 Admitting provider: Yolonda Kida, MD Discharge date: 04/14/2018   Admission Diagnoses: cad  Discharge Diagnoses:  Active Problems:   S/P drug eluting coronary stent placement     Consults: None  Significant Findings/ Diagnostic Studies: angiography: cardiac cath  Procedures: pci of rca  Discharge Condition: good  Disposition:   Diet: Cardiac diet  Discharge Activity: No lifting, driving, or strenuous exercise for 2 days  Discharge Instructions    AMB Referral to Cardiac Rehabilitation - Phase II   Complete by:  As directed    Diagnosis:  Coronary Stents      Follow-up Information    Teodoro Spray, MD Follow up in 2 week(s).   Specialty:  Cardiology Contact information: [place] [address] [phone]           Total time spent taking care of this patient: 30 minutes  Signed: Teodoro Spray 04/14/2018, 8:00 AM

## 2018-04-14 NOTE — Discharge Instructions (Signed)
Pt had a cardiac procedure. She should not work until Thursday, April 20, 2018 with no restrictions.

## 2018-04-14 NOTE — Discharge Summary (Signed)
Physician Discharge Summary  Patient ID: Carol [name] MRN: [id number] DOB/AGE: 1956-07-03 62 y.o.  Admit date: 04/13/2018 Discharge date: 04/14/2018  Admission Diagnoses:  Discharge Diagnoses:  Active Problems:   S/P drug eluting coronary stent placement   Discharged Condition: good  Hospital Course: Pt asdmitted after presenting for elective cardiac cath. Myoview showed inferior ischemia. Cath revealed 75% rca. PCI of rca with DES  Consults: None  Significant Diagnostic Studies: labs: met b  Treatments: IV hydration  Discharge Exam: Blood pressure (!) 127/52, pulse (!) 47, temperature 98.2 F (36.8 C), temperature source Oral, resp. rate 18, height 5' (1.524 m), weight 64 kg, SpO2 98 %. General appearance: alert and cooperative Eyes: conjunctivae/corneas clear. PERRL, EOM's intact. Fundi benign. Resp: clear to auscultation bilaterally Cardio: regular rate and rhythm, S1, S2 normal, no murmur, click, rub or gallop GI: soft, non-tender; bowel sounds normal; no masses,  no organomegaly Extremities: extremities normal, atraumatic, no cyanosis or edema Neurologic: Grossly normal  Disposition:   Discharge Instructions    AMB Referral to Cardiac Rehabilitation - Phase II   Complete by:  As directed    Diagnosis:  Coronary Stents      Follow-up Information    Teodoro Spray, MD Follow up in 2 week(s).   Specialty:  Cardiology Contact information: [place] [address] [phone]           Signed: Teodoro Spray 04/14/2018, 7:58 AM

## 2018-04-27 DIAGNOSIS — I471 Supraventricular tachycardia: Secondary | ICD-10-CM | POA: Diagnosis not present

## 2018-04-27 DIAGNOSIS — E782 Mixed hyperlipidemia: Secondary | ICD-10-CM | POA: Diagnosis not present

## 2018-04-27 DIAGNOSIS — I10 Essential (primary) hypertension: Secondary | ICD-10-CM | POA: Diagnosis not present

## 2018-04-27 DIAGNOSIS — Z720 Tobacco use: Secondary | ICD-10-CM | POA: Diagnosis not present

## 2018-04-27 DIAGNOSIS — I2510 Atherosclerotic heart disease of native coronary artery without angina pectoris: Secondary | ICD-10-CM | POA: Diagnosis not present

## 2018-05-16 ENCOUNTER — Other Ambulatory Visit: Payer: Self-pay | Admitting: Internal Medicine

## 2018-05-16 MED ORDER — PANTOPRAZOLE SODIUM 40 MG PO TBEC: 40.0000 mg | DELAYED_RELEASE_TABLET | Freq: Every day | ORAL | 3 refills | Status: DC

## 2018-05-18 IMAGING — CT CT RENAL STONE PROTOCOL
2 of 4 series · 16 of 46 positions shown, 18 images · non-contrast
Comparison: 01/04/2016 CT

CLINICAL DATA: Right-sided flank and groin pain x2 days with
increase in urinary frequency.

EXAM:
CT ABDOMEN AND PELVIS WITHOUT CONTRAST
TECHNIQUE: Multidetector CT imaging of the abdomen and pelvis was performed
following the standard protocol without IV contrast.

[Series 2: stone full standard (person_name) · axial · 0.73mm/px · z∈[-949,-584]mm · 13 of 81 slices shown, 15 images]
[im 4/81  soft-tissue]
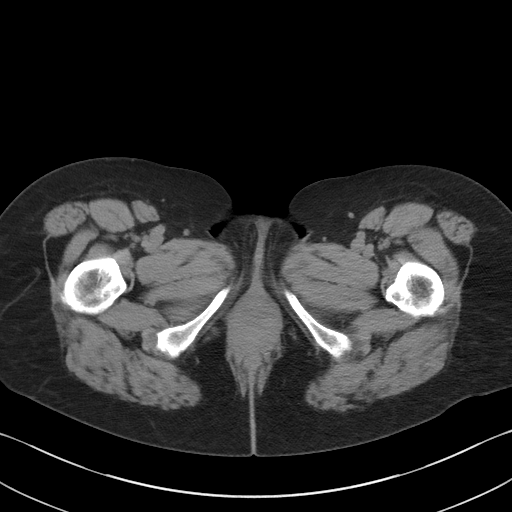
[im 4/81  bone]
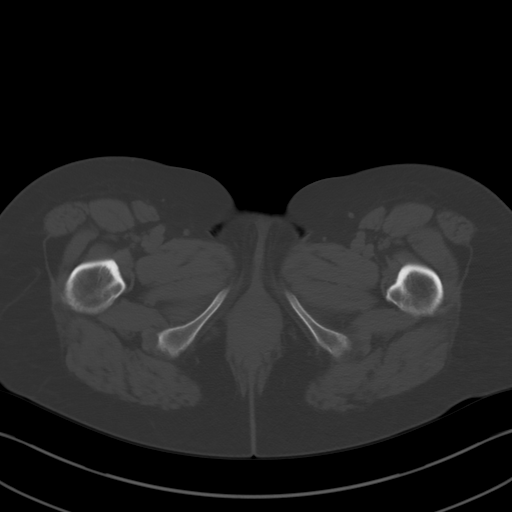
[im 10/81  soft-tissue]
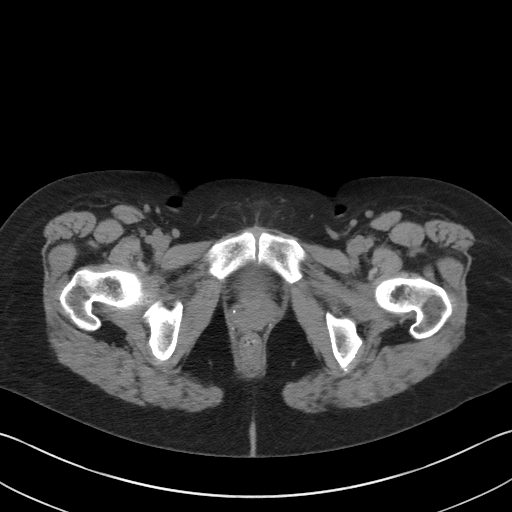
[im 17/81  soft-tissue]
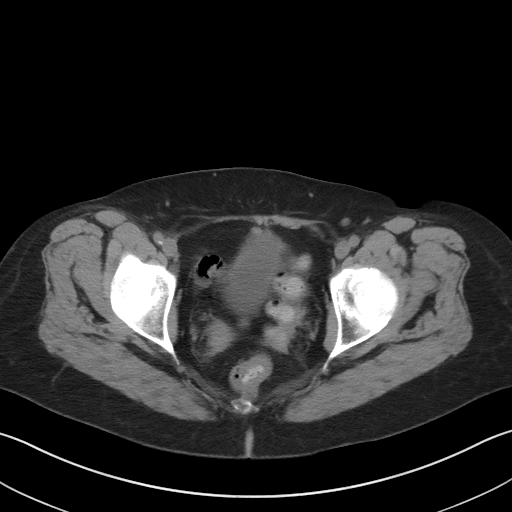
[im 23/81  soft-tissue]
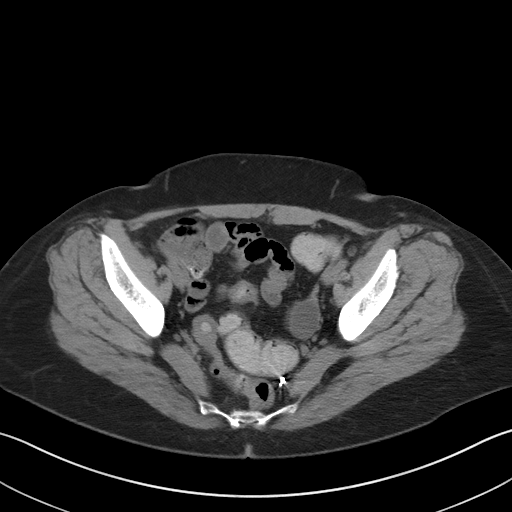
[im 29/81  soft-tissue]
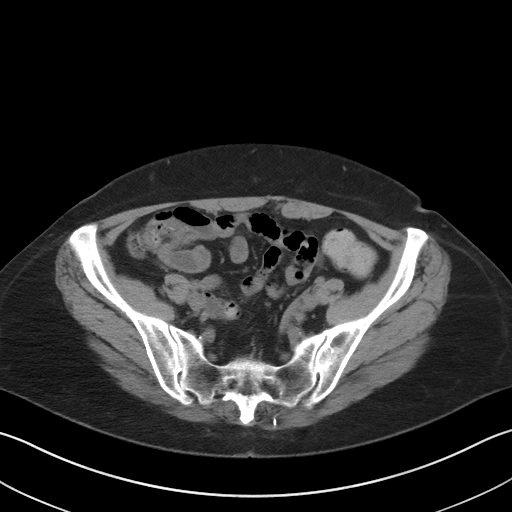
[im 36/81  soft-tissue]
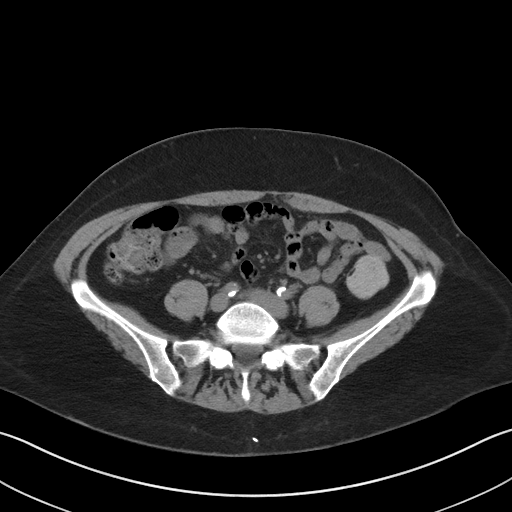
[im 42/81  soft-tissue]
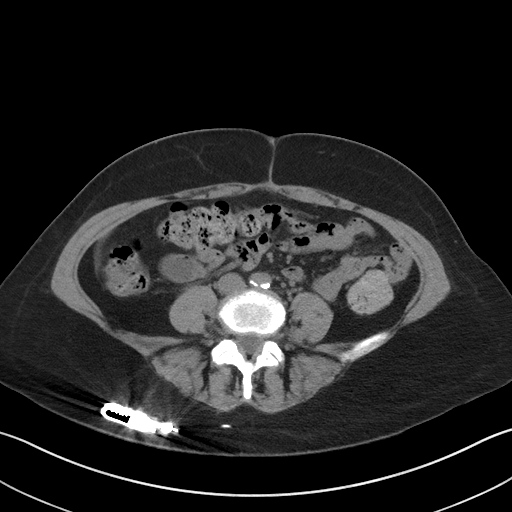
[im 45/81  soft-tissue]
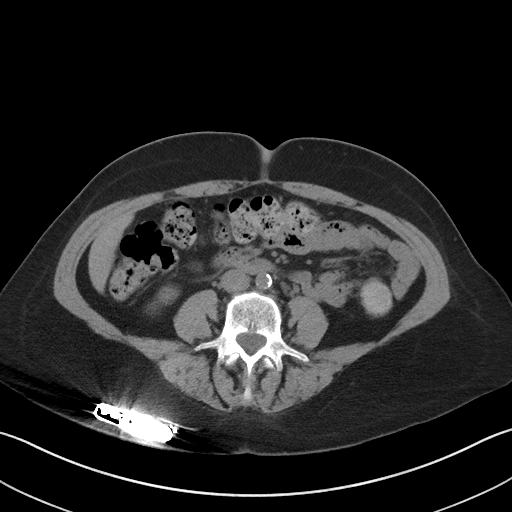
[im 52/81  soft-tissue]
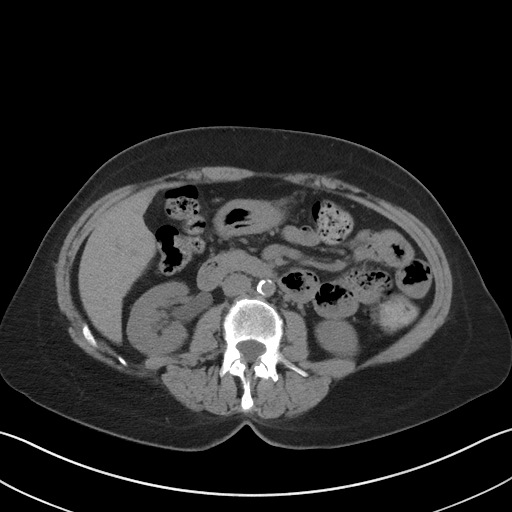
[im 52/81  bone]
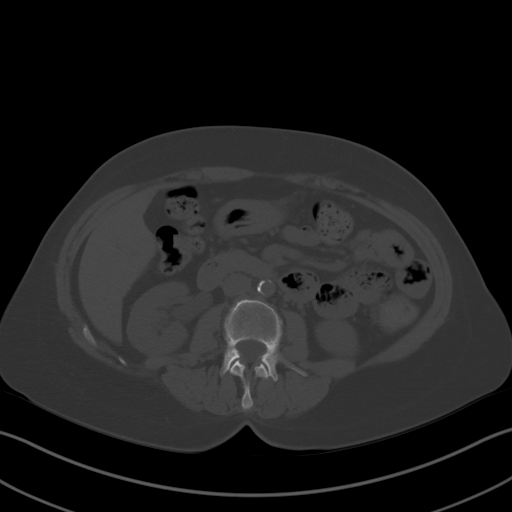
[im 58/81  soft-tissue]
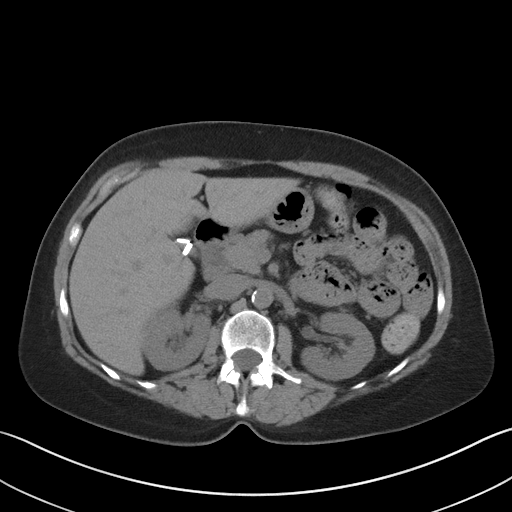
[im 65/81  soft-tissue]
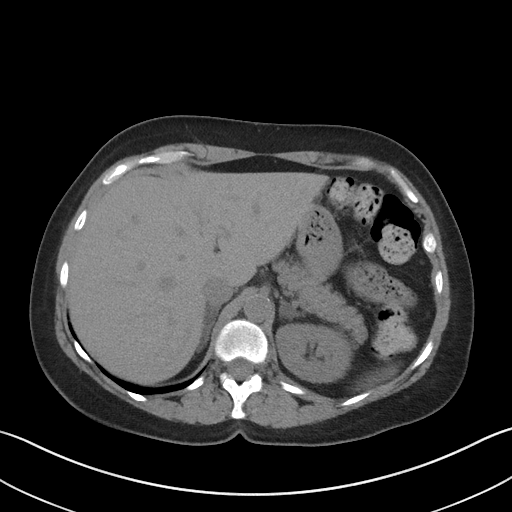
[im 71/81  soft-tissue]
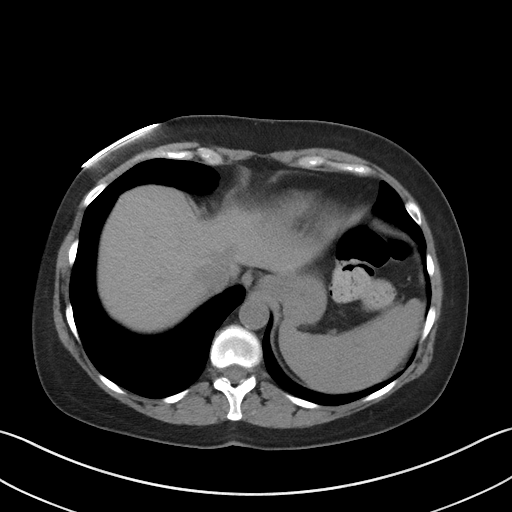
[im 77/81  soft-tissue]
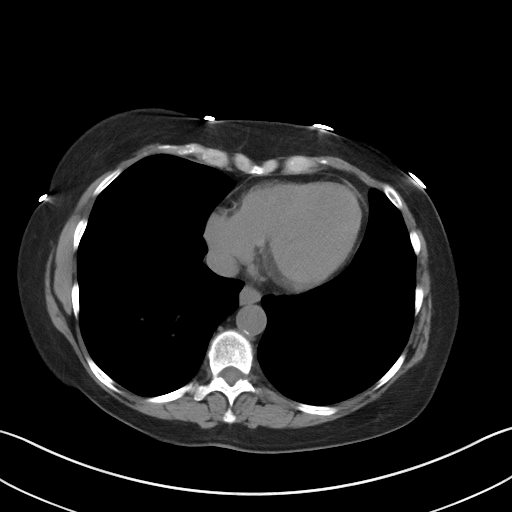

[Series 5: sagittal st · coronal · 0.68mm/px · 3 of 115 slices shown]
[im 39/115  soft-tissue]
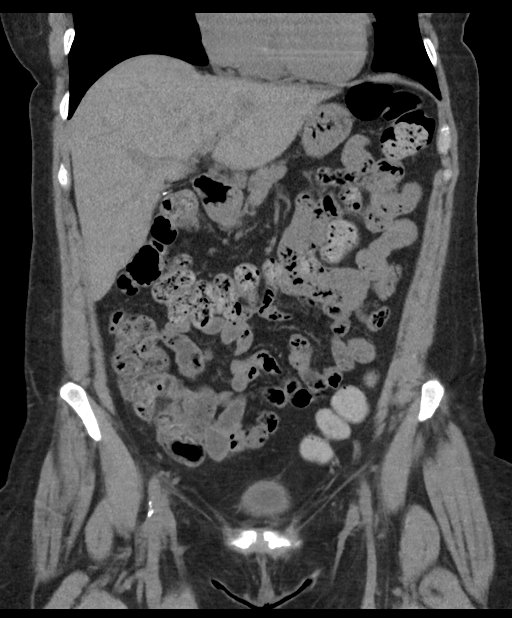
[im 51/115  soft-tissue]
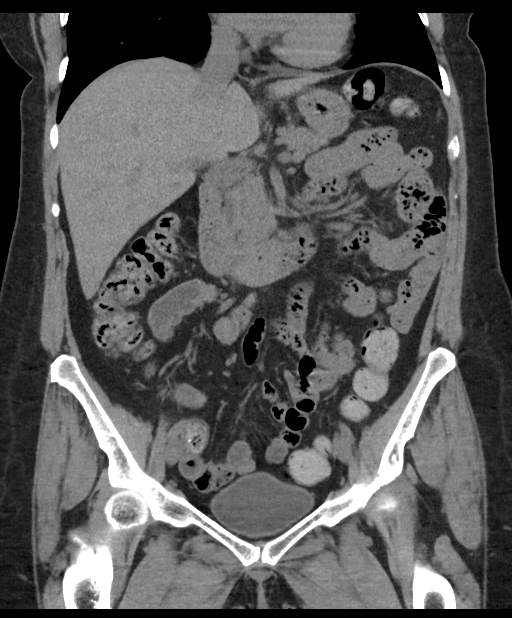
[im 64/115  soft-tissue]
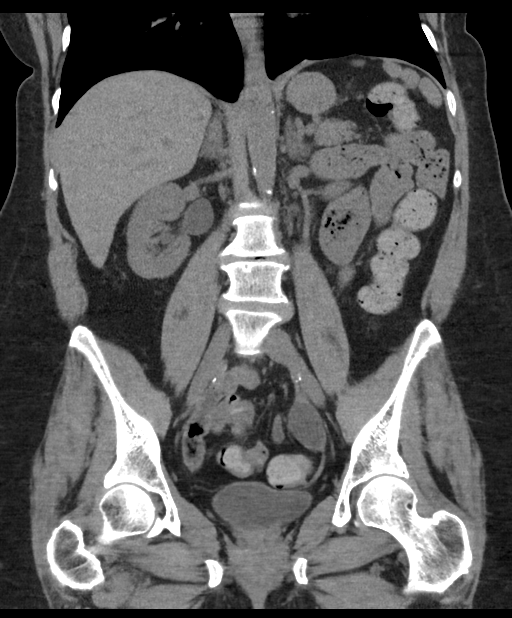

[16 of 46 positions shown; findings below may reference images not displayed]

FINDINGS: Lower chest: No acute abnormality. Normal size heart with minimal
coronary arteriosclerosis.

Hepatobiliary: No focal liver abnormality is seen given limitations
of a noncontrast exam. Status post cholecystectomy. No biliary
dilatation.

Pancreas: Normal

Spleen: Normal

Adrenals/Urinary Tract: Stable fullness of the left adrenal apex.
Normal right adrenal gland. No nephrolithiasis or perinephric fat
stranding. Small extrarenal pelves are again seen bilaterally. No
hydroureteronephrosis or ureteral calculi. No renal mass given lack
of IV contrast for further assessment. The urinary bladder is
unremarkable without focal mural thickening appreciated.

Stomach/Bowel: Stomach is within normal limits. Appendix is
surgically absent. No evidence of bowel wall thickening, distention,
or inflammatory changes.

Vascular/Lymphatic: Mild-to-moderate aortoiliac atherosclerosis. No
aneurysm. No lymphadenopathy.

Reproductive: Status posthysterectomy with stable left 3 cm left
adnexal cyst.

Other: No free air nor free fluid

Musculoskeletal: No acute nor suspicious osseous lesions. Mild
degenerative change of the lumbar spine. Right gluteal neural
stimulator device is noted with lead projecting through the left S3
neural foramen.
IMPRESSION: 1. No nephrolithiasis nor obstructive uropathy.
2. No acute findings within the abdomen or pelvis.
3. Chronic incidental findings as detailed above.

## 2018-05-18 IMAGING — CT CT ANGIO CHEST-ABD-PELV FOR DISSECTION W/ AND WO/W CM
2 of 7 series · 13 of 46 positions shown, 15 images · IV contrast (APPLIED)
Comparison: 04/26/2017 stone study and prior CT exam 01/04/2016,
09/07/2010

CLINICAL DATA: Worsening pain, now more epigastric and into the
chest. Patient had prior stone study complaining of right side flank
pain radiating down right groin. Hx of hysterectomy, cholecystectomy
and appendectomy.

EXAM:
CT ANGIOGRAPHY CHEST, ABDOMEN AND PELVIS
TECHNIQUE: Multidetector CT imaging through the chest, abdomen and pelvis was
performed using the standard protocol during bolus administration of
intravenous contrast. Multiplanar reconstructed images and MIPs were
obtained and reviewed to evaluate the vascular anatomy.
CONTRAST:  100 cc Isovue 370

[Series 5: axial arterial · axial · arterial · 0.84mm/px · z∈[-824,-311]mm · 10 of 199 slices shown, 12 images]
[im 14/199  soft-tissue]
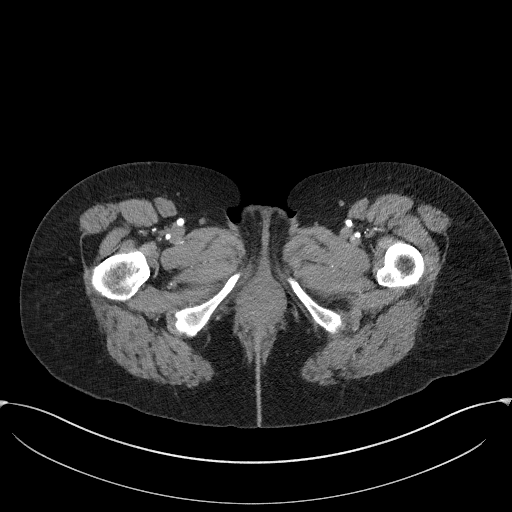
[im 14/199  bone]
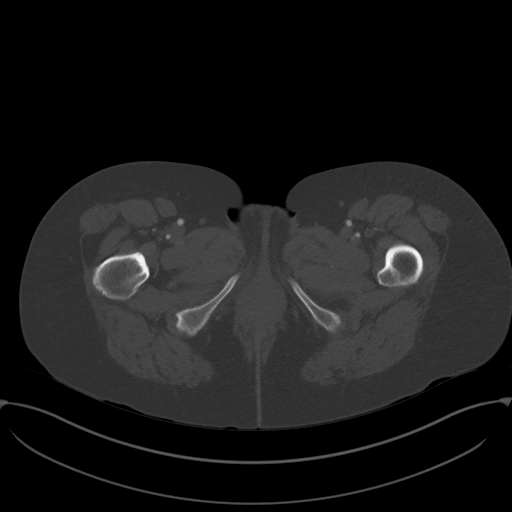
[im 40/199  soft-tissue]
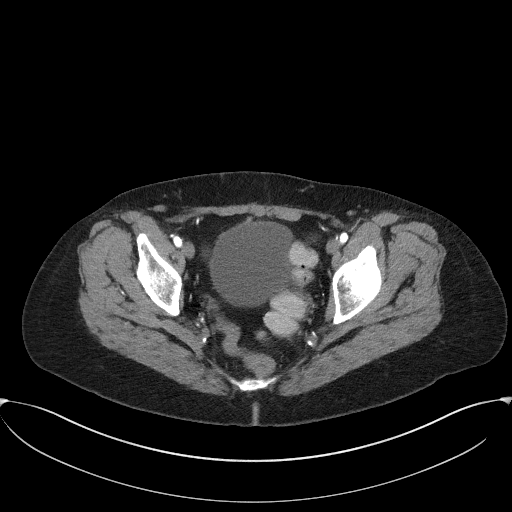
[im 53/199  soft-tissue]
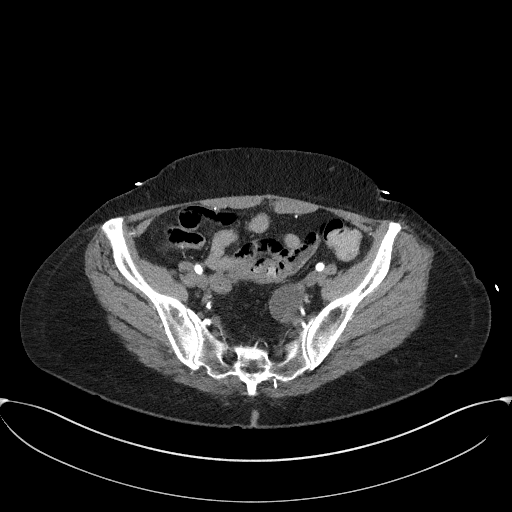
[im 67/199  soft-tissue]
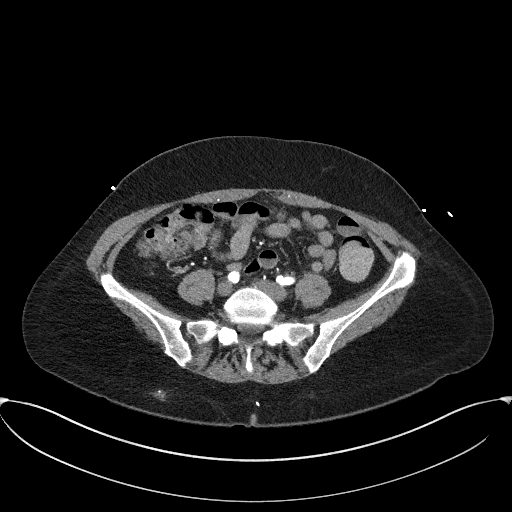
[im 93/199  soft-tissue]
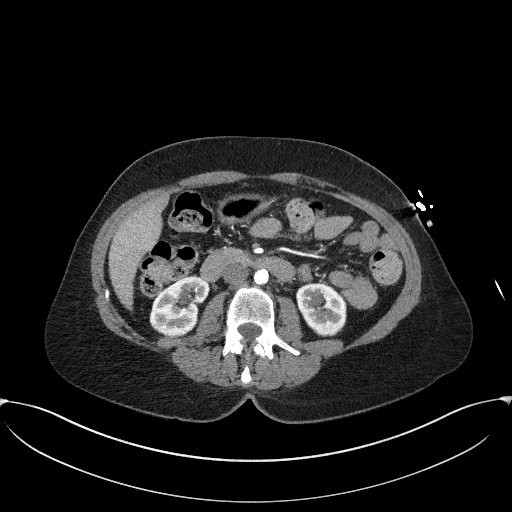
[im 106/199  soft-tissue]
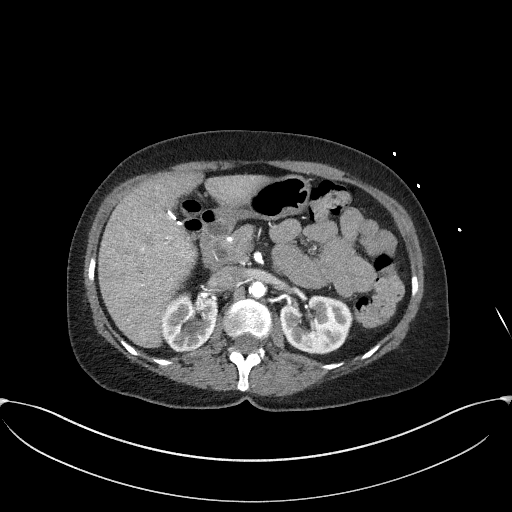
[im 133/199  soft-tissue]
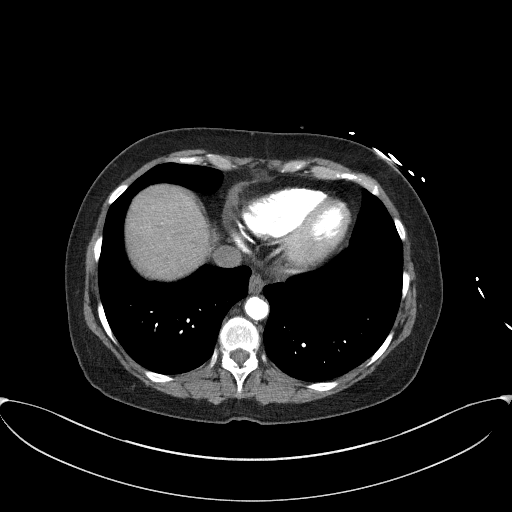
[im 146/199  soft-tissue]
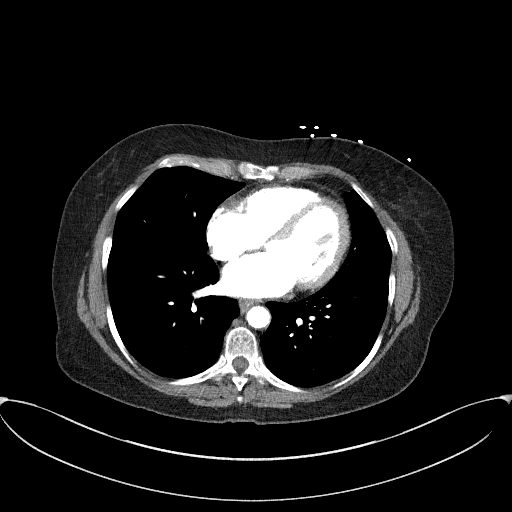
[im 159/199  soft-tissue]
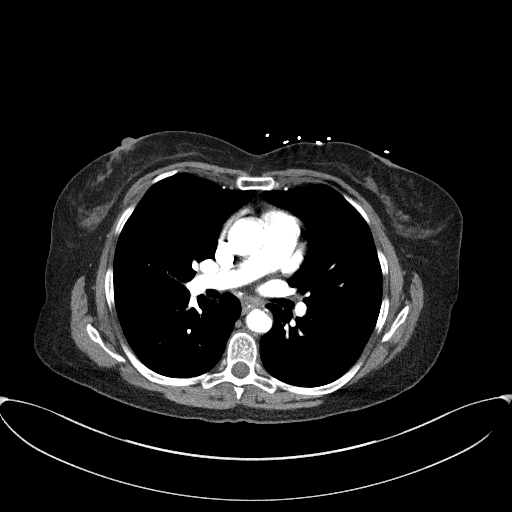
[im 159/199  bone]
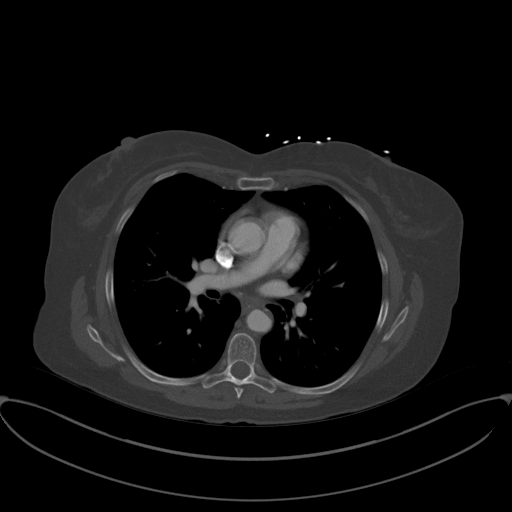
[im 185/199  soft-tissue]
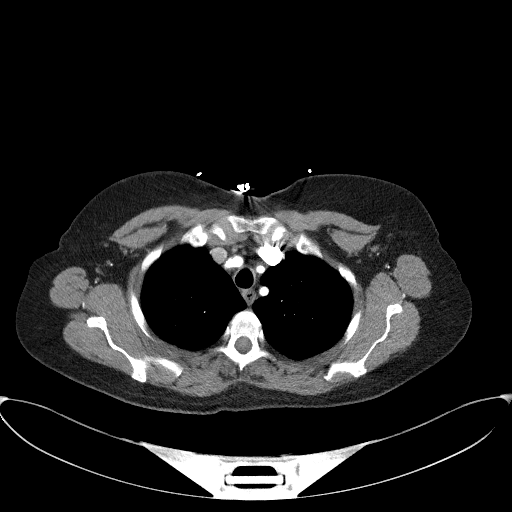

[Series 7: coronals · coronal · 0.66mm/px · 3 of 145 slices shown]
[im 37/145  soft-tissue]
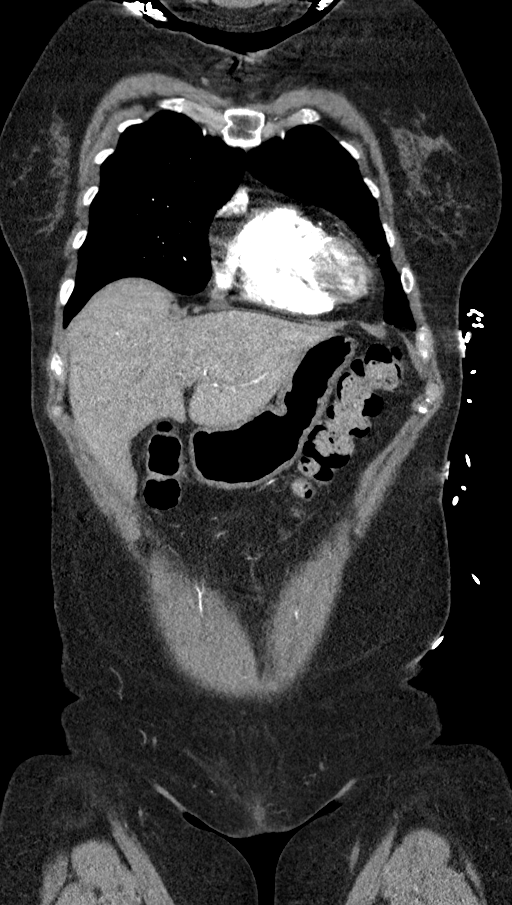
[im 73/145  soft-tissue]
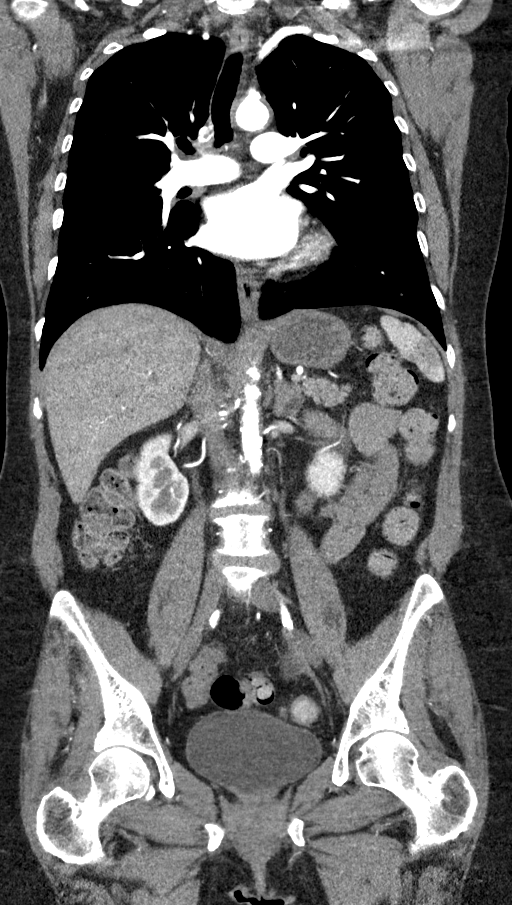
[im 109/145  soft-tissue]
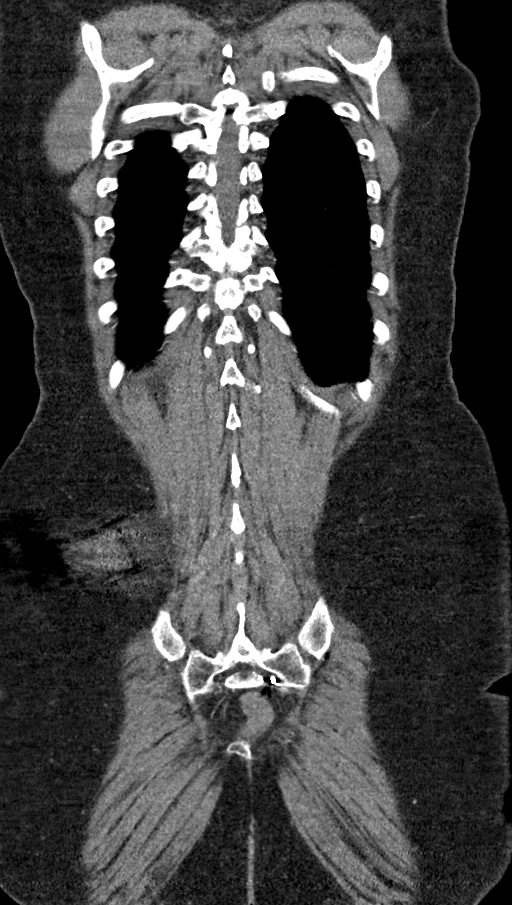

[13 of 46 positions shown; findings below may reference images not displayed]

FINDINGS: CTA CHEST FINDINGS

Cardiovascular: Heart size is normal. Coronary artery calcifications
are present, primarily involving the left anterior descending and
right coronary artery. No pericardial effusion. There is
atherosclerotic calcification of the thoracic aorta not associated
with aneurysm or dissection. Bovine arch anatomy. Calcification at
the origin of the great vessels. Appearance of the pulmonary
arteries is normal accounting for the contrast bolus timing.

Mediastinum/Nodes: The visualized portion of the thyroid gland has a
normal appearance. No mediastinal, hilar, or axillary adenopathy.
Esophagus is normal in appearance.

Lungs/Pleura: There is a 4 mm nodule within the right upper lobe
best seen on image 47 of series 6. No suspicious pulmonary nodules,
pleural effusions, or infiltrates.

Musculoskeletal: No chest wall abnormality. No acute or significant
osseous findings.

Review of the MIP images confirms the above findings.

CTA ABDOMEN AND PELVIS FINDINGS

VASCULAR

Aorta: There is atherosclerotic calcification of the abdominal
aorta. No associated aneurysm or dissection.

Celiac: Calcification at the origin of the celiac axis, associated
with 30% stenosis.

SMA: Atherosclerotic calcification at the origin of the SMA,
associated with 20% stenosis.

Renals: Renal arteries are patent without evidence of aneurysm,
dissection, vasculitis, fibromuscular dysplasia or significant
stenosis. There are 2 left renal arteries. Suspect 4 right renal
arteries.

IMA: Patent without evidence of aneurysm, dissection, vasculitis or
significant stenosis.

Inflow: Patent without evidence of aneurysm, dissection, vasculitis
or significant stenosis.

Veins: No obvious venous abnormality within the limitations of this
arterial phase study.

Review of the MIP images confirms the above findings.

NON-VASCULAR

Hepatobiliary: Status post cholecystectomy. No focal liver
abnormality identified.

Pancreas: Unremarkable. No pancreatic ductal dilatation or
surrounding inflammatory changes.

Spleen: Normal in size without focal abnormality.

Adrenals/Urinary Tract: Normal adrenal glands. Kidneys show
symmetric early enhancement. No hydronephrosis, renal mass, or
urinary tract obstruction.

Stomach/Bowel: Stomach and small bowel loops are normal in
appearance. Large bowel loops are normal in appearance. Status post
appendectomy.

Lymphatic: No retroperitoneal or mesenteric adenopathy.

Reproductive: The uterus is absent. 3.0 cm left adnexal cystic
lesion shows long-term stability.

Other: No free pelvic fluid. Anterior abdominal wall is
unremarkable. Right flank neural stimulator.

Musculoskeletal: No acute or significant osseous findings.

Review of the MIP images confirms the above findings.
IMPRESSION: 1. No evidence for aortic dissection or aneurysm.
2. Coronary artery disease.
3. No cardiomegaly.
4. Right upper lobe nodule. No follow-up needed if patient is
low-risk. Non-contrast chest CT can be considered in 12 months if
patient is high-risk. This recommendation follows the consensus
statement: Guidelines for Management of Incidental Pulmonary Nodules
Detected on CT Images: From the [HOSPITAL] 7372; Radiology
7372; [DATE].
5.  Aortic atherosclerosis.  ([id number].R)
6. Mild stenosis of celiac axis and SMA.
7. Status post cholecystectomy. Status post hysterectomy. Status
post appendectomy.
8. Stable left adnexal cyst.

## 2018-06-01 DIAGNOSIS — M150 Primary generalized (osteo)arthritis: Secondary | ICD-10-CM | POA: Diagnosis not present

## 2018-06-01 DIAGNOSIS — M2550 Pain in unspecified joint: Secondary | ICD-10-CM | POA: Diagnosis not present

## 2018-06-01 DIAGNOSIS — M797 Fibromyalgia: Secondary | ICD-10-CM | POA: Diagnosis not present

## 2018-06-01 DIAGNOSIS — M7521 Bicipital tendinitis, right shoulder: Secondary | ICD-10-CM | POA: Diagnosis not present

## 2018-06-07 ENCOUNTER — Telehealth: Payer: Self-pay

## 2018-06-07 NOTE — Telephone Encounter (Signed)
FAXED PRES TO [facility]

## 2018-07-07 DIAGNOSIS — J069 Acute upper respiratory infection, unspecified: Secondary | ICD-10-CM | POA: Diagnosis not present

## 2018-07-07 DIAGNOSIS — J441 Chronic obstructive pulmonary disease with (acute) exacerbation: Secondary | ICD-10-CM | POA: Diagnosis not present

## 2018-08-22 ENCOUNTER — Encounter: Payer: Self-pay | Admitting: Emergency Medicine

## 2018-08-22 ENCOUNTER — Emergency Department
Admission: EM | Admit: 2018-08-22 | Discharge: 2018-08-22 | Disposition: A | Discharge status: Home / Self Care | Payer: Medicare Other | Attending: Emergency Medicine | Admitting: Emergency Medicine

## 2018-08-22 ENCOUNTER — Emergency Department: Payer: Medicare Other

## 2018-08-22 ENCOUNTER — Other Ambulatory Visit: Payer: Self-pay

## 2018-08-22 DIAGNOSIS — R0789 Other chest pain: Secondary | ICD-10-CM | POA: Diagnosis not present

## 2018-08-22 DIAGNOSIS — I252 Old myocardial infarction: Secondary | ICD-10-CM | POA: Diagnosis not present

## 2018-08-22 DIAGNOSIS — Z9049 Acquired absence of other specified parts of digestive tract: Secondary | ICD-10-CM | POA: Insufficient documentation

## 2018-08-22 DIAGNOSIS — Z8673 Personal history of transient ischemic attack (TIA), and cerebral infarction without residual deficits: Secondary | ICD-10-CM | POA: Diagnosis not present

## 2018-08-22 DIAGNOSIS — Z79899 Other long term (current) drug therapy: Secondary | ICD-10-CM | POA: Insufficient documentation

## 2018-08-22 DIAGNOSIS — I2510 Atherosclerotic heart disease of native coronary artery without angina pectoris: Secondary | ICD-10-CM | POA: Insufficient documentation

## 2018-08-22 DIAGNOSIS — Z7902 Long term (current) use of antithrombotics/antiplatelets: Secondary | ICD-10-CM | POA: Insufficient documentation

## 2018-08-22 DIAGNOSIS — J209 Acute bronchitis, unspecified: Secondary | ICD-10-CM | POA: Diagnosis not present

## 2018-08-22 DIAGNOSIS — F17210 Nicotine dependence, cigarettes, uncomplicated: Secondary | ICD-10-CM | POA: Diagnosis not present

## 2018-08-22 DIAGNOSIS — F319 Bipolar disorder, unspecified: Secondary | ICD-10-CM | POA: Diagnosis not present

## 2018-08-22 DIAGNOSIS — I10 Essential (primary) hypertension: Secondary | ICD-10-CM | POA: Diagnosis not present

## 2018-08-22 DIAGNOSIS — E039 Hypothyroidism, unspecified: Secondary | ICD-10-CM | POA: Insufficient documentation

## 2018-08-22 DIAGNOSIS — R079 Chest pain, unspecified: Secondary | ICD-10-CM | POA: Diagnosis not present

## 2018-08-22 DIAGNOSIS — R05 Cough: Secondary | ICD-10-CM | POA: Diagnosis present

## 2018-08-22 LAB — CBC
HCT: 38.7 % (ref 36.0–46.0)
Hemoglobin: 12.3 g/dL (ref 12.0–15.0)
MCH: 29.3 pg (ref 26.0–34.0)
MCHC: 31.8 g/dL (ref 30.0–36.0)
MCV: 92.1 fL (ref 80.0–100.0)
NRBC: 0 % (ref 0.0–0.2)
Platelets: 191 10*3/uL (ref 150–400)
RBC: 4.2 MIL/uL (ref 3.87–5.11)
RDW: 14 % (ref 11.5–15.5)
WBC: 7.6 10*3/uL (ref 4.0–10.5)

## 2018-08-22 LAB — INFLUENZA PANEL BY PCR (TYPE A & B)
Influenza A By PCR: NEGATIVE
Influenza B By PCR: NEGATIVE

## 2018-08-22 LAB — BASIC METABOLIC PANEL
ANION GAP: 5 (ref 5–15)
BUN: 8 mg/dL (ref 8–23)
CO2: 23 mmol/L (ref 22–32)
Calcium: 9.3 mg/dL (ref 8.9–10.3)
Chloride: 110 mmol/L (ref 98–111)
Creatinine, Ser: 0.67 mg/dL (ref 0.44–1.00)
GFR calc Af Amer: >60 mL/min (ref >60)
GFR calc non Af Amer: >60 mL/min (ref >60)
Glucose, Bld: 59 mg/dL — ABNORMAL LOW (ref 70–99)
Potassium: 4.1 mmol/L (ref 3.5–5.1)
SODIUM: 138 mmol/L (ref 135–145)

## 2018-08-22 LAB — TROPONIN I: Troponin I: <0.03 ng/mL (ref <0.03)

## 2018-08-22 MED ORDER — AZITHROMYCIN 250 MG PO TABS: ORAL_TABLET | ORAL | 0 refills | Status: AC

## 2018-08-22 MED ORDER — AZITHROMYCIN 500 MG PO TABS
500.0000 mg | ORAL_TABLET | Freq: Once | ORAL | Status: AC
  Administered 2018-08-22: 500 mg via ORAL
  Filled 2018-08-22: qty 1

## 2018-08-22 MED ORDER — BENZONATATE 100 MG PO CAPS: 100.0000 mg | ORAL_CAPSULE | Freq: Three times a day (TID) | ORAL | 0 refills | Status: DC | PRN

## 2018-08-22 MED ORDER — HYDROCOD POLST-CPM POLST ER 10-8 MG/5ML PO SUER
5.0000 mL | Freq: Once | ORAL | Status: AC
  Administered 2018-08-22: 5 mL via ORAL
  Filled 2018-08-22: qty 5

## 2018-08-22 NOTE — ED Triage Notes (Signed)
Pt c/o SOB and cough xfew days. Pt also states CP with burning sensation. VSS.

## 2018-08-23 NOTE — ED Provider Notes (Signed)
[facility] Emergency Department Provider Note ___________   First MD Initiated Contact with Patient 08/22/18 1631     (approximate)  I have reviewed the triage vital signs and the nursing notes.   HISTORY  Chief Complaint Shortness of Breath    HPI Carol [name] is a 63 y.o. female with below list of chronic medical conditions presents to the emergency department with 4-day history of cough and dyspnea.  Patient denies any fever afebrile on presentation temperature 98.7.  Patient admits to burning chest discomfort with coughing.  Patient does admit to half a pack cigarette use daily.   Past Medical History:  Diagnosis Date  . Bipolar affective disorder ([place])   . Coronary artery disease   . Hyperlipidemia   . Hypertension   . Hypothyroid   . Myocardial infarction ([place])   . Stroke [facility]) 11/15/2016   "right side weaker since" (12/20/2016)    Patient Active Problem List   Diagnosis Date Noted  . S/P drug eluting coronary stent placement 04/13/2018  . Choledocholithiasis with acute cholecystitis 12/19/2016  . Coronary artery disease 12/19/2016  . Essential hypertension 12/19/2016  . Hypothyroidism 12/19/2016  . History of stroke 12/19/2016  . Migraine 06/17/2016  . Chest pain 05/12/2015  . Right facial numbness 03/30/2015  . Lithium intoxication 02/27/2015  . Bipolar disorder ([place]) 02/27/2015  . CVA (cerebral infarction) 02/26/2015  . Unstable angina ([place]) 02/03/2015    Past Surgical History:  Procedure Laterality Date  . ABDOMINAL HYSTERECTOMY    . APPENDECTOMY    . CARDIAC CATHETERIZATION Left 09/26/2015   Procedure: Left Heart Cath and Coronary Angiography;  Surgeon: Teodoro Spray, MD;  Location: [place] [facility];  Service: Cardiovascular;  Laterality: Left;  . CHOLECYSTECTOMY N/A 12/22/2016   Procedure: LAPAROSCOPIC CHOLECYSTECTOMY WITH INTRAOPERATIVE CHOLANGIOGRAM;  Surgeon: Jovita Kussmaul, MD;  Location: [place];  Service: General;   Laterality: N/A;  . CORONARY STENT INTERVENTION N/A 04/13/2018   Procedure: CORONARY STENT INTERVENTION;  Surgeon: Yolonda Kida, MD;  Location: [place] [facility];  Service: Cardiovascular;  Laterality: N/A;  . ERCP N/A 12/20/2016   Procedure: ENDOSCOPIC RETROGRADE CHOLANGIOPANCREATOGRAPHY (ERCP);  Surgeon: Teena Irani, MD;  Location: [facility] ENDOSCOPY;  Service: Endoscopy;  Laterality: N/A;  . HYSTEROTOMY    . LEFT HEART CATH AND CORONARY ANGIOGRAPHY Left 04/13/2018   Procedure: LEFT HEART CATH AND CORONARY ANGIOGRAPHY;  Surgeon: Teodoro Spray, MD;  Location: [place] [facility];  Service: Cardiovascular;  Laterality: Left;    Prior to Admission medications   Medication Sig Start Date End Date Taking? Authorizing Provider  albuterol (PROVENTIL HFA;VENTOLIN HFA) 108 (90 BASE) MCG/ACT inhaler Inhale 2 puffs into the lungs every 6 (six) hours as needed for wheezing or shortness of breath.    [provider]  albuterol (PROVENTIL) (2.5 MG/3ML) 0.083% nebulizer solution USE 1 VIAL VIA NEBULIZER  EVERY 4 TO 6 HOURS AS  NEEDED Patient taking differently: Take 2.5 mg by nebulization every 4 (four) hours as needed for wheezing or shortness of breath.  09/07/17   Lavera Guise, MD  azithromycin (ZITHROMAX Z-PAK) 250 MG tablet Take 2 tablets (500 mg) on  Day 1,  followed by 1 tablet (250 mg) once daily on Days 2 through 5. 08/22/18 08/27/18  Gregor Hams, MD  benzonatate (TESSALON PERLES) 100 MG capsule Take 1 capsule (100 mg total) by mouth 3 (three) times daily as needed for cough. 08/22/18 08/22/19  Gregor Hams, MD  calcium-vitamin  D (OSCAL-500) 500-400 MG-UNIT tablet Take 1 tablet by mouth daily.     [provider]  cetirizine (ZYRTEC) 10 MG tablet Take 10 mg by mouth daily.     [provider]  citalopram (CELEXA) 40 MG tablet TAKE 1 TABLET BY MOUTH  DAILY 02/27/18   Lavera Guise, MD  clopidogrel (PLAVIX) 75 MG tablet Take 75 mg by mouth daily.     [provider]  diltiazem (CARDIZEM) 30 MG tablet Take 30 mg by mouth daily as needed (rapid heart beat).    [provider]  fluticasone (FLONASE) 50 MCG/ACT nasal spray USE 1 SPRAY IN EACH NOSTRIL TWO TIMES DAILY Patient taking differently: Place 1 spray into both nostrils daily.  09/07/17   Lavera Guise, MD  isosorbide mononitrate (IMDUR) 30 MG 24 hr tablet Take 30 mg by mouth daily.    [provider]  levothyroxine (SYNTHROID, LEVOTHROID) 100 MCG tablet TAKE 1 TABLET BY MOUTH  DAILY 02/27/18   Lavera Guise, MD  lisinopril (PRINIVIL,ZESTRIL) 10 MG tablet Take 10 mg by mouth daily.    [provider]  metoprolol tartrate (LOPRESSOR) 25 MG tablet Take 25 mg by mouth 2 (two) times daily.    [provider]  nitroGLYCERIN (NITROSTAT) 0.4 MG SL tablet Place 0.4 mg under the tongue every 5 (five) minutes as needed for chest pain.    [provider]  pantoprazole (PROTONIX) 40 MG tablet Take 1 tablet (40 mg total) by mouth daily. 05/16/18   Lavera Guise, MD  saccharomyces boulardii (FLORASTOR) 250 MG capsule Take 1 capsule (250 mg total) by mouth 2 (two) times daily. Patient not taking: Reported on 04/10/2018 12/23/16   Lavina Hamman, MD  simethicone (GAS-X) 80 MG chewable tablet Chew 1 tablet (80 mg total) by mouth 4 (four) times daily as needed for flatulence. 04/26/17 04/26/18  Eula Listen, MD  simvastatin (ZOCOR) 20 MG tablet Take 1 tablet (20 mg total) by mouth at bedtime. 09/28/17   Lavera Guise, MD    Allergies Morphine; Pregabalin; Rofecoxib; Sulfamethoxazole-trimethoprim; and Asa [aspirin]  Family History  Problem Relation Age of Onset  . CAD Other     Social History Social History   Tobacco Use  . Smoking status: Current Every Day Smoker    Packs/day: 0.50    Years: 45.00    Pack years: 22.50  . Smokeless tobacco: Never Used  . Tobacco comment: was up to two packs a day  Substance Use Topics  . Alcohol use: No  . Drug  use: No    Review of Systems Constitutional: No fever/chills Eyes: No visual changes. ENT: No sore throat. Cardiovascular: Positive for burning chest discomfort Respiratory: Denies shortness of breath.  Positive for cough Gastrointestinal: No abdominal pain.  No nausea, no vomiting.  No diarrhea.  No constipation. Genitourinary: Negative for dysuria. Musculoskeletal: Negative for neck pain.  Negative for back pain. Integumentary: Negative for rash. Neurological: Negative for headaches, focal weakness or numbness.   ____________________________________________   PHYSICAL EXAM:  VITAL SIGNS: ED Triage Vitals  Enc Vitals Group     BP 08/22/18 1349 (!) 153/56     Pulse Rate 08/22/18 1349 65     Resp 08/22/18 1349 20     Temp 08/22/18 1349 98.7 F (37.1 C)     Temp Source 08/22/18 1349 Oral     SpO2 08/22/18 1349 95 %     Weight 08/22/18 1349 63.5 kg (140 lb)  Height 08/22/18 1349 1.524 m (5')     Head Circumference --      Peak Flow --      Pain Score 08/22/18 1359 7     Pain Loc --      Pain Edu? --      Excl. in [place]? --     Constitutional: Alert and oriented.  Coughing  eyes: Conjunctivae are normal.  Head: Atraumatic. Mouth/Throat: Mucous membranes are moist. Oropharynx non-erythematous. Neck: No stridor.   Cardiovascular: Normal rate, regular rhythm. Good peripheral circulation. Grossly normal heart sounds. Respiratory: Normal respiratory effort.  No retractions. Lungs CTAB. Gastrointestinal: Soft and nontender. No distention.  Musculoskeletal: No lower extremity tenderness nor edema. No gross deformities of extremities. Neurologic:  Normal speech and language. No gross focal neurologic deficits are appreciated.  Skin:  Skin is warm, dry and intact. No rash noted.   ____________________________________________   LABS (all labs ordered are listed, but only abnormal results are displayed)  Labs Reviewed  BASIC METABOLIC PANEL - Abnormal; Notable for the  following components:      Result Value   Glucose, Bld 59 (*)    All other components within normal limits  CBC  TROPONIN I  INFLUENZA PANEL BY PCR (TYPE A & B)   ____________________________________________  EKG  ED ECG REPORT I, [place] N , the attending physician, personally viewed and interpreted this ECG.   Date: 08/22/2018  EKG Time: 1:57 PM  Rate: 64  Rhythm: Normal sinus rhythm  Axis: Normal  Intervals: Normal  ST&T Change: None  ____________________________________________  RADIOLOGY I, [place] N , personally viewed and evaluated these images (plain radiographs) as part of my medical decision making, as well as reviewing the written report by the radiologist.  ED MD interpretation: No acute cardiopulmonary disease noted on chest x-ray per radiologist  Official radiology report(s): No results found.    Procedures   ____________________________________________   INITIAL IMPRESSION / ASSESSMENT AND PLAN / ED COURSE  As part of my medical decision making, I reviewed the following data within the electronic MEDICAL RECORD NUMBER  63 year old female presenting with above-stated history and physical exam secondary to cough and chest discomfort.  Considered possibility of influenza however swabs negative.  Also considered possibility of pneumonia and bronchitis.  Chest x-ray revealed no evidence of pneumonia suspect the patient's symptoms to be secondary to bronchitis.  Patient given azithromycin and Tussionex in the emergency department will be prescribed azithromycin and Tessalon for home ____________________________________________  FINAL CLINICAL IMPRESSION(S) / ED DIAGNOSES  Final diagnoses:  Acute bronchitis, unspecified organism     MEDICATIONS GIVEN DURING THIS VISIT:  Medications  chlorpheniramine-HYDROcodone (TUSSIONEX) 10-8 MG/5ML suspension 5 mL (5 mLs Oral Given 08/22/18 1724)  azithromycin (ZITHROMAX) tablet 500 mg (500 mg Oral Given  08/22/18 1724)     ED Discharge Orders         Ordered    benzonatate (TESSALON PERLES) 100 MG capsule  3 times daily PRN     08/22/18 1820    azithromycin (ZITHROMAX Z-PAK) 250 MG tablet     08/22/18 1820           Note:  This document was prepared using Dragon voice recognition software and may include unintentional dictation errors.    Gregor Hams, MD 08/23/18 2204

## 2018-08-30 DIAGNOSIS — E782 Mixed hyperlipidemia: Secondary | ICD-10-CM | POA: Diagnosis not present

## 2018-08-30 DIAGNOSIS — I471 Supraventricular tachycardia: Secondary | ICD-10-CM | POA: Diagnosis not present

## 2018-08-30 DIAGNOSIS — Z955 Presence of coronary angioplasty implant and graft: Secondary | ICD-10-CM | POA: Diagnosis not present

## 2018-08-30 DIAGNOSIS — Z720 Tobacco use: Secondary | ICD-10-CM | POA: Diagnosis not present

## 2018-08-30 DIAGNOSIS — I10 Essential (primary) hypertension: Secondary | ICD-10-CM | POA: Diagnosis not present

## 2018-10-03 ENCOUNTER — Other Ambulatory Visit: Payer: Self-pay | Admitting: Internal Medicine

## 2018-11-24 DIAGNOSIS — S93402A Sprain of unspecified ligament of left ankle, initial encounter: Secondary | ICD-10-CM | POA: Diagnosis not present

## 2019-01-16 DIAGNOSIS — E782 Mixed hyperlipidemia: Secondary | ICD-10-CM | POA: Diagnosis not present

## 2019-01-16 DIAGNOSIS — Z720 Tobacco use: Secondary | ICD-10-CM | POA: Diagnosis not present

## 2019-01-16 DIAGNOSIS — I471 Supraventricular tachycardia: Secondary | ICD-10-CM | POA: Diagnosis not present

## 2019-01-16 DIAGNOSIS — I10 Essential (primary) hypertension: Secondary | ICD-10-CM | POA: Diagnosis not present

## 2019-01-16 DIAGNOSIS — I2510 Atherosclerotic heart disease of native coronary artery without angina pectoris: Secondary | ICD-10-CM | POA: Diagnosis not present

## 2019-01-25 DIAGNOSIS — I739 Peripheral vascular disease, unspecified: Secondary | ICD-10-CM | POA: Diagnosis not present

## 2019-03-01 DIAGNOSIS — M79641 Pain in right hand: Secondary | ICD-10-CM | POA: Diagnosis not present

## 2019-03-01 DIAGNOSIS — M797 Fibromyalgia: Secondary | ICD-10-CM | POA: Diagnosis not present

## 2019-03-01 DIAGNOSIS — M79642 Pain in left hand: Secondary | ICD-10-CM | POA: Diagnosis not present

## 2019-03-01 DIAGNOSIS — M7521 Bicipital tendinitis, right shoulder: Secondary | ICD-10-CM | POA: Diagnosis not present

## 2019-03-01 DIAGNOSIS — M150 Primary generalized (osteo)arthritis: Secondary | ICD-10-CM | POA: Diagnosis not present

## 2019-04-03 ENCOUNTER — Other Ambulatory Visit: Payer: Self-pay | Admitting: Internal Medicine

## 2019-04-12 ENCOUNTER — Ambulatory Visit: Payer: Medicare Other | Admitting: Adult Health

## 2019-04-19 ENCOUNTER — Other Ambulatory Visit: Payer: Self-pay | Admitting: Adult Health

## 2019-04-20 NOTE — Telephone Encounter (Signed)
PT NEED APPT FOR FURTER REFILLS

## 2019-05-23 ENCOUNTER — Ambulatory Visit: Payer: Medicare Other | Admitting: Adult Health

## 2019-05-29 ENCOUNTER — Other Ambulatory Visit: Payer: Self-pay

## 2019-05-29 ENCOUNTER — Encounter: Payer: Self-pay | Admitting: Adult Health

## 2019-05-29 ENCOUNTER — Ambulatory Visit (INDEPENDENT_AMBULATORY_CARE_PROVIDER_SITE_OTHER): Payer: Medicare Other | Admitting: Adult Health

## 2019-05-29 VITALS — BP 132/63 | HR 66 | Temp 97.5°F | Resp 16 | Wt 139.0 lb

## 2019-05-29 DIAGNOSIS — Z13820 Encounter for screening for osteoporosis: Secondary | ICD-10-CM

## 2019-05-29 DIAGNOSIS — E782 Mixed hyperlipidemia: Secondary | ICD-10-CM | POA: Diagnosis not present

## 2019-05-29 DIAGNOSIS — R5383 Other fatigue: Secondary | ICD-10-CM | POA: Diagnosis not present

## 2019-05-29 DIAGNOSIS — D229 Melanocytic nevi, unspecified: Secondary | ICD-10-CM

## 2019-05-29 DIAGNOSIS — F17200 Nicotine dependence, unspecified, uncomplicated: Secondary | ICD-10-CM

## 2019-05-29 DIAGNOSIS — T7840XS Allergy, unspecified, sequela: Secondary | ICD-10-CM

## 2019-05-29 DIAGNOSIS — R300 Dysuria: Secondary | ICD-10-CM

## 2019-05-29 DIAGNOSIS — E039 Hypothyroidism, unspecified: Secondary | ICD-10-CM

## 2019-05-29 DIAGNOSIS — Z0001 Encounter for general adult medical examination with abnormal findings: Secondary | ICD-10-CM

## 2019-05-29 DIAGNOSIS — I10 Essential (primary) hypertension: Secondary | ICD-10-CM

## 2019-05-29 MED ORDER — MONTELUKAST SODIUM 10 MG PO TABS: 10.0000 mg | ORAL_TABLET | Freq: Every day | ORAL | 3 refills | Status: DC

## 2019-05-29 NOTE — Progress Notes (Addendum)
[facility] [place], [place] [address]  Internal MEDICINE  Office Visit Note  Patient Name: Carol [name]  [id number]  [id number]  Date of Service: 06/25/2019  Chief Complaint  Patient presents with  . Annual Exam  . Hypertension  . Hyperlipidemia     HPI Pt is here for routine health maintenance examination.  Pt is a well appearing 63 yo female.  She has a history of HTN, HLD, GERd.  Her HTn appears well controlled.  She denies any overt gerd symptoms at this time. She does report a few atypical moles that she would like referral to dermatology for.    Current Medication: Outpatient Encounter Medications as of 05/29/2019  Medication Sig  . albuterol (PROVENTIL HFA;VENTOLIN HFA) 108 (90 BASE) MCG/ACT inhaler Inhale 2 puffs into the lungs every 6 (six) hours as needed for wheezing or shortness of breath.  Marland Kitchen albuterol (PROVENTIL) (2.5 MG/3ML) 0.083% nebulizer solution USE 1 VIAL VIA NEBULIZER  EVERY 4 TO 6 HOURS AS  NEEDED (Patient taking differently: Take 2.5 mg by nebulization every 4 (four) hours as needed for wheezing or shortness of breath. )  . atorvastatin (LIPITOR) 10 MG tablet Take 1 tab po daily  . calcium-vitamin D (OSCAL-500) 500-400 MG-UNIT tablet Take 1 tablet by mouth daily.   . cetirizine (ZYRTEC) 10 MG tablet Take 10 mg by mouth daily.  . clopidogrel (PLAVIX) 75 MG tablet Take 75 mg by mouth daily.   Marland Kitchen diltiazem (CARDIZEM CD) 120 MG 24 hr capsule Take by mouth daily.   Marland Kitchen diltiazem (CARDIZEM) 30 MG tablet Take 30 mg by mouth daily as needed (rapid heart beat).  . fluticasone (FLONASE) 50 MCG/ACT nasal spray USE 1 SPRAY IN EACH NOSTRIL TWO TIMES DAILY (Patient taking differently: Place 1 spray into both nostrils daily. )  . isosorbide mononitrate (IMDUR) 30 MG 24 hr tablet Take 30 mg by mouth daily.  Marland Kitchen lisinopril (PRINIVIL,ZESTRIL) 10 MG tablet Take 10 mg by mouth daily.  . metoprolol tartrate (LOPRESSOR) 25 MG tablet Take 12.5 mg by mouth  daily.   . nitroGLYCERIN (NITROSTAT) 0.4 MG SL tablet Place 0.4 mg under the tongue every 5 (five) minutes as needed for chest pain.  . pantoprazole (PROTONIX) 40 MG tablet TAKE 1 TABLET BY MOUTH  DAILY  . [DISCONTINUED] Cetirizine HCl 10 MG TBDP Take by mouth. Take 1 tab po daily  . [DISCONTINUED] citalopram (CELEXA) 40 MG tablet TAKE 1 TABLET BY MOUTH  DAILY  . [DISCONTINUED] levothyroxine (SYNTHROID) 100 MCG tablet TAKE 1 TABLET BY MOUTH  DAILY  . saccharomyces boulardii (FLORASTOR) 250 MG capsule Take 1 capsule (250 mg total) by mouth 2 (two) times daily. (Patient not taking: Reported on 04/10/2018)  . simethicone (GAS-X) 80 MG chewable tablet Chew 1 tablet (80 mg total) by mouth 4 (four) times daily as needed for flatulence.  . [DISCONTINUED] benzonatate (TESSALON PERLES) 100 MG capsule Take 1 capsule (100 mg total) by mouth 3 (three) times daily as needed for cough. (Patient not taking: Reported on 05/29/2019)  . [DISCONTINUED] cetirizine (ZYRTEC) 10 MG tablet Take 10 mg by mouth daily.   . [DISCONTINUED] montelukast (SINGULAIR) 10 MG tablet Take 1 tablet (10 mg total) by mouth at bedtime.  . [DISCONTINUED] simvastatin (ZOCOR) 20 MG tablet Take 1 tablet (20 mg total) by mouth at bedtime.   No facility-administered encounter medications on file as of 05/29/2019.     Surgical History: Past Surgical History:  Procedure Laterality Date  . ABDOMINAL  HYSTERECTOMY    . APPENDECTOMY    . CARDIAC CATHETERIZATION Left 09/26/2015   Procedure: Left Heart Cath and Coronary Angiography;  Surgeon: Teodoro Spray, MD;  Location: [facility];  Service: Cardiovascular;  Laterality: Left;  . CHOLECYSTECTOMY N/A 12/22/2016   Procedure: LAPAROSCOPIC CHOLECYSTECTOMY WITH INTRAOPERATIVE CHOLANGIOGRAM;  Surgeon: Jovita Kussmaul, MD;  Location: [facility];  Service: General;  Laterality: N/A;  . CORONARY STENT INTERVENTION N/A 04/13/2018   Procedure: CORONARY STENT INTERVENTION;  Surgeon: Yolonda Kida,  MD;  Location: [facility];  Service: Cardiovascular;  Laterality: N/A;  . ERCP N/A 12/20/2016   Procedure: ENDOSCOPIC RETROGRADE CHOLANGIOPANCREATOGRAPHY (ERCP);  Surgeon: Teena Irani, MD;  Location: [facility];  Service: Endoscopy;  Laterality: N/A;  . HYSTEROTOMY    . LEFT HEART CATH AND CORONARY ANGIOGRAPHY Left 04/13/2018   Procedure: LEFT HEART CATH AND CORONARY ANGIOGRAPHY;  Surgeon: Teodoro Spray, MD;  Location: [facility];  Service: Cardiovascular;  Laterality: Left;    Medical History: Past Medical History:  Diagnosis Date  . Bipolar affective disorder ([place])   . Coronary artery disease   . Hyperlipidemia   . Hypertension   . Hypothyroid   . Myocardial infarction ([place])   . Stroke [facility]) 11/15/2016   "right side weaker since" (12/20/2016)    Family History: Family History  Problem Relation Age of Onset  . CAD Other       Review of Systems  Constitutional: Negative for chills, fatigue and unexpected weight change.  HENT: Negative for congestion, rhinorrhea, sneezing and sore throat.   Eyes: Negative for photophobia, pain and redness.  Respiratory: Negative for cough, chest tightness and shortness of breath.   Cardiovascular: Negative for chest pain and palpitations.  Gastrointestinal: Negative for abdominal pain, constipation, diarrhea, nausea and vomiting.  Endocrine: Negative.   Genitourinary: Negative for dysuria and frequency.  Musculoskeletal: Negative for arthralgias, back pain, joint swelling and neck pain.  Skin: Negative for rash.  Allergic/Immunologic: Negative.   Neurological: Negative for tremors and numbness.  Hematological: Negative for adenopathy. Does not bruise/bleed easily.  Psychiatric/Behavioral: Negative for behavioral problems and sleep disturbance. The patient is not nervous/anxious.      Vital Signs: BP 132/63   Pulse 66   Temp (!) 97.5 F (36.4 C)   Resp 16   Wt 139 lb (63 kg)   SpO2 96%   BMI 27.15 kg/m     Physical Exam Vitals signs and nursing note reviewed.  Constitutional:      General: She is not in acute distress.    Appearance: She is well-developed. She is not diaphoretic.  HENT:     Head: Normocephalic and atraumatic.     Mouth/Throat:     Pharynx: No oropharyngeal exudate.  Eyes:     Pupils: Pupils are equal, round, and reactive to light.  Neck:     Musculoskeletal: Normal range of motion and neck supple.     Thyroid: No thyromegaly.     Vascular: No JVD.     Trachea: No tracheal deviation.  Cardiovascular:     Rate and Rhythm: Normal rate and regular rhythm.     Heart sounds: Normal heart sounds. No murmur. No friction rub. No gallop.   Pulmonary:     Effort: Pulmonary effort is normal. No respiratory distress.     Breath sounds: Normal breath sounds. No wheezing or rales.  Chest:     Chest wall: No tenderness.     Breasts:  Right: Normal.        Left: Normal.     Comments: Exam Chaperoned by Corlis Hove CMA Abdominal:     Palpations: Abdomen is soft.     Tenderness: There is no abdominal tenderness. There is no guarding.  Musculoskeletal: Normal range of motion.  Lymphadenopathy:     Cervical: No cervical adenopathy.  Skin:    General: Skin is warm and dry.  Neurological:     Mental Status: She is alert and oriented to person, place, and time.     Cranial Nerves: No cranial nerve deficit.  Psychiatric:        Behavior: Behavior normal.        Thought Content: Thought content normal.        Judgment: Judgment normal.     LABS: Recent Results (from the past 2160 hour(s))  UA/M w/rflx Culture, Routine     Status: None   Collection Time: 05/29/19 11:46 AM   Specimen: Urine   URINE  Result Value Ref Range   Specific Gravity, UA 1.008 1.005 - 1.030   pH, UA 6.5 5.0 - 7.5   Color, UA Yellow Yellow   Appearance Ur Clear Clear   Leukocytes,UA Negative Negative   Protein,UA Negative Negative/Trace   Glucose, UA Negative Negative   Ketones, UA  Negative Negative   RBC, UA Negative Negative   Bilirubin, UA Negative Negative   Urobilinogen, Ur 0.2 0.2 - 1.0 mg/dL   Nitrite, UA Negative Negative   Microscopic Examination Comment     Comment: Microscopic follows if indicated.   Microscopic Examination See below:     Comment: Microscopic was indicated and was performed.   Urinalysis Reflex Comment     Comment: This specimen will not reflex to a Urine Culture.  Microscopic Examination     Status: None   Collection Time: 05/29/19 11:46 AM   URINE  Result Value Ref Range   WBC, UA 0-5 0 - 5 /hpf   RBC 0-2 0 - 2 /hpf   Epithelial Cells (non renal) 0-10 0 - 10 /hpf   Casts None seen None seen /lpf   Mucus, UA Present Not Estab.   Bacteria, UA None seen None seen/Few  CBC with Differential/Platelet     Status: None   Collection Time: 06/06/19  9:02 AM  Result Value Ref Range   WBC 7.0 3.4 - 10.8 x10E3/uL   RBC 3.89 3.77 - 5.28 x10E6/uL   Hemoglobin 11.9 11.1 - 15.9 g/dL   Hematocrit 36.2 34.0 - 46.6 %   MCV 93 79 - 97 fL   MCH 30.6 26.6 - 33.0 pg   MCHC 32.9 31.5 - 35.7 g/dL   RDW 12.9 11.7 - 15.4 %   Platelets 202 150 - 450 x10E3/uL   Neutrophils 73 Not Estab. %   Lymphs 17 Not Estab. %   Monocytes 6 Not Estab. %   Eos 3 Not Estab. %   Basos 1 Not Estab. %   Neutrophils Absolute 5.1 1.4 - 7.0 x10E3/uL   Lymphocytes Absolute 1.2 0.7 - 3.1 x10E3/uL   Monocytes Absolute 0.4 0.1 - 0.9 x10E3/uL   EOS (ABSOLUTE) 0.2 0.0 - 0.4 x10E3/uL   Basophils Absolute 0.0 0.0 - 0.2 x10E3/uL   Immature Granulocytes 0 Not Estab. %   Immature Grans (Abs) 0.0 0.0 - 0.1 x10E3/uL  Lipid Panel With LDL/HDL Ratio     Status: Abnormal   Collection Time: 06/06/19  9:02 AM  Result Value Ref Range  Cholesterol, Total 200 (H) 100 - 199 mg/dL   Triglycerides 159 (H) 0 - 149 mg/dL   HDL 54 >39 mg/dL   VLDL Cholesterol Cal 28 5 - 40 mg/dL   LDL Chol Calc (NIH) 118 (H) 0 - 99 mg/dL   LDL/HDL Ratio 2.2 0.0 - 3.2 ratio    Comment:                                      LDL/HDL Ratio                                             Men  Women                               1/2 Avg.Risk  1.0    1.5                                   Avg.Risk  3.6    3.2                                2X Avg.Risk  6.2    5.0                                3X Avg.Risk  8.0    6.1   TSH     Status: None   Collection Time: 06/06/19  9:02 AM  Result Value Ref Range   TSH 0.628 0.450 - 4.500 uIU/mL  T4, free     Status: None   Collection Time: 06/06/19  9:02 AM  Result Value Ref Range   Free T4 1.12 0.82 - 1.77 ng/dL  Comprehensive metabolic panel     Status: Abnormal   Collection Time: 06/06/19  9:02 AM  Result Value Ref Range   Glucose 106 (H) 65 - 99 mg/dL   BUN 17 8 - 27 mg/dL   Creatinine, Ser 1.03 (H) 0.57 - 1.00 mg/dL   GFR calc non Af Amer 57 (L) >59 mL/min/1.73   GFR calc Af Amer 66 >59 mL/min/1.73   BUN/Creatinine Ratio 17 12 - 28   Sodium 142 134 - 144 mmol/L   Potassium 4.5 3.5 - 5.2 mmol/L   Chloride 108 (H) 96 - 106 mmol/L   CO2 23 20 - 29 mmol/L   Calcium 9.6 8.7 - 10.3 mg/dL   Total Protein 6.6 6.0 - 8.5 g/dL   Albumin 4.1 3.8 - 4.8 g/dL   Globulin, Total 2.5 1.5 - 4.5 g/dL   Albumin/Globulin Ratio 1.6 1.2 - 2.2   Bilirubin Total <0.2 0.0 - 1.2 mg/dL   Alkaline Phosphatase 108 39 - 117 IU/L   AST 14 0 - 40 IU/L   ALT 10 0 - 32 IU/L  B12 and Folate Panel     Status: Abnormal   Collection Time: 06/06/19  9:02 AM  Result Value Ref Range   Vitamin B-12 120 (L) 232 - 1,245 pg/mL   Folate 3.0 (L) >3.0 ng/mL    Comment: A serum folate concentration of less  than 3.1 ng/mL is considered to represent clinical deficiency.   Vitamin D 1,25 dihydroxy     Status: None   Collection Time: 06/06/19  9:02 AM  Result Value Ref Range   Vitamin D 1, 25 (OH)2 Total 51 pg/mL    Comment: Reference Range: Adults: 21 - 65    Vitamin D2 1, 25 (OH)2 <10 pg/mL    Comment: This test was developed and its performance characteristics determined by [facility].  It has not been cleared or approved by the Food and Drug Administration.    Vitamin D3 1, 25 (OH)2 45 pg/mL    Comment: This test was developed and its performance characteristics determined by [facility]. It has not been cleared or approved by the Food and Drug Administration.   Fe+TIBC+Fer     Status: Abnormal   Collection Time: 06/06/19  9:02 AM  Result Value Ref Range   Total Iron Binding Capacity 368 250 - 450 ug/dL   UIBC 328 118 - 369 ug/dL   Iron 40 27 - 139 ug/dL   Iron Saturation 11 (L) 15 - 55 %   Ferritin 20 15 - 150 ng/mL     Assessment/Plan: 1. Encounter for general adult medical examination with abnormal findings Up to date on PHM, will review labs when available.  - CBC with Differential/Platelet - Lipid Panel With LDL/HDL Ratio - TSH - T4, free - Comprehensive metabolic panel  2. Essential hypertension Stable, continue present management.   3. Mixed hyperlipidemia Will check lipid panel and follow up with patient when results are available.   4. Other fatigue Check the following fatigue labs - B12 and Folate Panel - Vitamin D 1,25 dihydroxy - Fe+TIBC+Fer  5. Screening for osteoporosis Will get bone density.  - DG Bone Density; Future  6. Atypical mole Referral to dermatology for moles. - Ambulatory referral to Dermatology  7. Nicotine dependence with current use Smoking cessation counseling: 1. Pt acknowledges the risks of long term smoking, she will try to quite smoking. 2. Options for different medications including nicotine products, chewing gum, patch etc, Wellbutrin and Chantix is discussed 3. Goal and date of compete cessation is discussed 4. Total time spent in smoking cessation is 15 min.  8. Allergy, sequela Continue with allergy medications as discussed.   9. Dysuria - UA/M w/rflx Culture, Routine  10. Hypothyroidism, unspecified type Will draw thyroid labs, and review with patient at follow up visit.   General Counseling: cadia cobo understanding of the findings of todays visit and agrees with plan of treatment. I have discussed any further diagnostic evaluation that may be needed or ordered today. We also reviewed her medications today. she has been encouraged to call the office with any questions or concerns that should arise related to todays visit.   Orders Placed This Encounter  Procedures  . Microscopic Examination  . DG Bone Density  . UA/M w/rflx Culture, Routine  . CBC with Differential/Platelet  . Lipid Panel With LDL/HDL Ratio  . TSH  . T4, free  . Comprehensive metabolic panel  . B12 and Folate Panel  . Vitamin D 1,25 dihydroxy  . Fe+TIBC+Fer  . Ambulatory referral to Dermatology    Meds ordered this encounter  Medications  . DISCONTD: montelukast (SINGULAIR) 10 MG tablet    Sig: Take 1 tablet (10 mg total) by mouth at bedtime.    Dispense:  30 tablet    Refill:  3    Time spent: 30 Minutes   This patient  was seen by Orson Gear AGNP-C in Collaboration with Dr Lavera Guise as a part of collaborative care agreement    Kendell Bane AGNP-C Internal Medicine

## 2019-05-30 LAB — UA/M W/RFLX CULTURE, ROUTINE
Bilirubin, UA: NEGATIVE
Glucose, UA: NEGATIVE
Ketones, UA: NEGATIVE
Leukocytes,UA: NEGATIVE
Nitrite, UA: NEGATIVE
Protein,UA: NEGATIVE
RBC, UA: NEGATIVE
Specific Gravity, UA: 1.008 (ref 1.005–1.030)
Urobilinogen, Ur: 0.2 mg/dL (ref 0.2–1.0)
pH, UA: 6.5 (ref 5.0–7.5)

## 2019-05-30 LAB — MICROSCOPIC EXAMINATION
Bacteria, UA: NONE SEEN
Casts: NONE SEEN /lpf

## 2019-06-04 ENCOUNTER — Other Ambulatory Visit: Payer: Self-pay | Admitting: Nurse Practitioner

## 2019-06-04 ENCOUNTER — Other Ambulatory Visit: Payer: Self-pay | Admitting: Adult Health

## 2019-06-04 DIAGNOSIS — T7840XS Allergy, unspecified, sequela: Secondary | ICD-10-CM

## 2019-06-04 MED ORDER — LEVOTHYROXINE SODIUM 100 MCG PO TABS: 100.0000 ug | ORAL_TABLET | Freq: Every day | ORAL | 3 refills | Status: DC

## 2019-06-04 MED ORDER — MONTELUKAST SODIUM 10 MG PO TABS: 10.0000 mg | ORAL_TABLET | Freq: Every day | ORAL | 3 refills | Status: DC

## 2019-06-04 MED ORDER — CITALOPRAM HYDROBROMIDE 40 MG PO TABS: 40.0000 mg | ORAL_TABLET | Freq: Every day | ORAL | 3 refills | Status: DC

## 2019-06-05 ENCOUNTER — Telehealth: Payer: Self-pay

## 2019-06-05 ENCOUNTER — Other Ambulatory Visit: Payer: Self-pay | Admitting: Adult Health

## 2019-06-05 MED ORDER — TRAMADOL HCL 50 MG PO TABS: 50.0000 mg | ORAL_TABLET | Freq: Four times a day (QID) | ORAL | 0 refills | Status: DC | PRN

## 2019-06-05 NOTE — Progress Notes (Signed)
Sent RX for Tramadol for arthritis flare, pt has follow up appt next week.

## 2019-06-06 ENCOUNTER — Other Ambulatory Visit: Payer: Self-pay | Admitting: Internal Medicine

## 2019-06-06 DIAGNOSIS — Z0001 Encounter for general adult medical examination with abnormal findings: Secondary | ICD-10-CM | POA: Diagnosis not present

## 2019-06-06 DIAGNOSIS — R5383 Other fatigue: Secondary | ICD-10-CM | POA: Diagnosis not present

## 2019-06-06 MED ORDER — TRAMADOL HCL 50 MG PO TABS: ORAL_TABLET | ORAL | 0 refills | Status: DC

## 2019-06-12 ENCOUNTER — Ambulatory Visit: Payer: Medicare Other | Admitting: Adult Health

## 2019-06-12 LAB — COMPREHENSIVE METABOLIC PANEL
ALT: 10 IU/L (ref 0–32)
AST: 14 IU/L (ref 0–40)
Albumin/Globulin Ratio: 1.6 (ref 1.2–2.2)
Albumin: 4.1 g/dL (ref 3.8–4.8)
Alkaline Phosphatase: 108 IU/L (ref 39–117)
BUN/Creatinine Ratio: 17 (ref 12–28)
BUN: 17 mg/dL (ref 8–27)
Bilirubin Total: <0.2 mg/dL (ref 0.0–1.2)
CO2: 23 mmol/L (ref 20–29)
Calcium: 9.6 mg/dL (ref 8.7–10.3)
Chloride: 108 mmol/L — ABNORMAL HIGH (ref 96–106)
Creatinine, Ser: 1.03 mg/dL — ABNORMAL HIGH (ref 0.57–1.00)
GFR calc Af Amer: 66 mL/min/{1.73_m2} (ref >59)
GFR calc non Af Amer: 57 mL/min/{1.73_m2} — ABNORMAL LOW (ref >59)
Globulin, Total: 2.5 g/dL (ref 1.5–4.5)
Glucose: 106 mg/dL — ABNORMAL HIGH (ref 65–99)
Potassium: 4.5 mmol/L (ref 3.5–5.2)
Sodium: 142 mmol/L (ref 134–144)
Total Protein: 6.6 g/dL (ref 6.0–8.5)

## 2019-06-12 LAB — CBC WITH DIFFERENTIAL/PLATELET
Basophils Absolute: 0 10*3/uL (ref 0.0–0.2)
Basos: 1 %
EOS (ABSOLUTE): 0.2 10*3/uL (ref 0.0–0.4)
Eos: 3 %
Hematocrit: 36.2 % (ref 34.0–46.6)
Hemoglobin: 11.9 g/dL (ref 11.1–15.9)
Immature Grans (Abs): 0 10*3/uL (ref 0.0–0.1)
Immature Granulocytes: 0 %
Lymphocytes Absolute: 1.2 10*3/uL (ref 0.7–3.1)
Lymphs: 17 %
MCH: 30.6 pg (ref 26.6–33.0)
MCHC: 32.9 g/dL (ref 31.5–35.7)
MCV: 93 fL (ref 79–97)
Monocytes Absolute: 0.4 10*3/uL (ref 0.1–0.9)
Monocytes: 6 %
Neutrophils Absolute: 5.1 10*3/uL (ref 1.4–7.0)
Neutrophils: 73 %
Platelets: 202 10*3/uL (ref 150–450)
RBC: 3.89 x10E6/uL (ref 3.77–5.28)
RDW: 12.9 % (ref 11.7–15.4)
WBC: 7 10*3/uL (ref 3.4–10.8)

## 2019-06-12 LAB — VITAMIN D 1,25 DIHYDROXY
Vitamin D 1, 25 (OH)2 Total: 51 pg/mL
Vitamin D2 1, 25 (OH)2: <10 pg/mL
Vitamin D3 1, 25 (OH)2: 45 pg/mL

## 2019-06-12 LAB — LIPID PANEL WITH LDL/HDL RATIO
Cholesterol, Total: 200 mg/dL — ABNORMAL HIGH (ref 100–199)
HDL: 54 mg/dL (ref >39)
LDL Chol Calc (NIH): 118 mg/dL — ABNORMAL HIGH (ref 0–99)
LDL/HDL Ratio: 2.2 ratio (ref 0.0–3.2)
Triglycerides: 159 mg/dL — ABNORMAL HIGH (ref 0–149)
VLDL Cholesterol Cal: 28 mg/dL (ref 5–40)

## 2019-06-12 LAB — B12 AND FOLATE PANEL
Folate: 3 ng/mL — ABNORMAL LOW (ref >3.0)
Vitamin B-12: 120 pg/mL — ABNORMAL LOW (ref 232–1245)

## 2019-06-12 LAB — T4, FREE: Free T4: 1.12 ng/dL (ref 0.82–1.77)

## 2019-06-12 LAB — IRON,TIBC AND FERRITIN PANEL
Ferritin: 20 ng/mL (ref 15–150)
Iron Saturation: 11 % — ABNORMAL LOW (ref 15–55)
Iron: 40 ug/dL (ref 27–139)
Total Iron Binding Capacity: 368 ug/dL (ref 250–450)
UIBC: 328 ug/dL (ref 118–369)

## 2019-06-12 LAB — TSH: TSH: 0.628 u[IU]/mL (ref 0.450–4.500)

## 2019-06-12 NOTE — Telephone Encounter (Signed)
Taken care of meds at follow up appt.

## 2019-06-13 ENCOUNTER — Ambulatory Visit: Payer: Medicare Other | Admitting: Adult Health

## 2019-06-20 DIAGNOSIS — M4126 Other idiopathic scoliosis, lumbar region: Secondary | ICD-10-CM | POA: Diagnosis not present

## 2019-06-20 DIAGNOSIS — M545 Low back pain: Secondary | ICD-10-CM | POA: Diagnosis not present

## 2019-06-20 DIAGNOSIS — M5416 Radiculopathy, lumbar region: Secondary | ICD-10-CM | POA: Diagnosis not present

## 2019-06-20 DIAGNOSIS — M542 Cervicalgia: Secondary | ICD-10-CM | POA: Diagnosis not present

## 2019-06-26 ENCOUNTER — Encounter: Payer: Self-pay | Admitting: Adult Health

## 2019-06-26 ENCOUNTER — Other Ambulatory Visit: Payer: Self-pay

## 2019-06-26 ENCOUNTER — Ambulatory Visit (INDEPENDENT_AMBULATORY_CARE_PROVIDER_SITE_OTHER): Payer: Medicare Other | Admitting: Adult Health

## 2019-06-26 VITALS — BP 122/59 | HR 68 | Temp 97.2°F | Resp 16 | Ht 60.0 in | Wt 139.6 lb

## 2019-06-26 DIAGNOSIS — F17200 Nicotine dependence, unspecified, uncomplicated: Secondary | ICD-10-CM

## 2019-06-26 DIAGNOSIS — R7301 Impaired fasting glucose: Secondary | ICD-10-CM | POA: Diagnosis not present

## 2019-06-26 DIAGNOSIS — E782 Mixed hyperlipidemia: Secondary | ICD-10-CM

## 2019-06-26 DIAGNOSIS — E538 Deficiency of other specified B group vitamins: Secondary | ICD-10-CM | POA: Diagnosis not present

## 2019-06-26 DIAGNOSIS — I10 Essential (primary) hypertension: Secondary | ICD-10-CM

## 2019-06-26 LAB — POCT GLYCOSYLATED HEMOGLOBIN (HGB A1C): Hemoglobin A1C: 5.4 % (ref 4.0–5.6)

## 2019-06-26 MED ORDER — CYANOCOBALAMIN 1000 MCG/ML IJ SOLN
1000.0000 ug | Freq: Once | INTRAMUSCULAR | Status: AC
  Administered 2019-06-26: 1000 ug via INTRAMUSCULAR

## 2019-06-26 NOTE — Progress Notes (Addendum)
[facility] [place], [place] [address]  Internal MEDICINE  Office Visit Note  Patient Name: Carol [name]  [id number]  [id number]  Date of Service: 07/23/2019  Chief Complaint  Patient presents with  . Hyperlipidemia  . Hypertension  . Follow-up    labs    HPI  Pt is here for follow up on HTN, HLD and to review labs. She denies any recent issues with her bp.  It is well controlled today 122/59.  She Denies Chest pain, Shortness of breath, palpitations, headache, or blurred vision. We have reviewed her labs at this time. Her lipid panel is discussed. Her triglycerides are elevated at 159, however this is an improvement from the previous levels in 2016. She is currently taking lipitor 10mg  and is happy with this.  Her labs also show b12 deficieny, and she would like to start B12 injections at this time.  We discussed rechecking her iron, and folate once her b12 has had time to improve.  Overall she is doing well, and denies any complaints.      Current Medication: Outpatient Encounter Medications as of 06/26/2019  Medication Sig  . albuterol (PROVENTIL HFA;VENTOLIN HFA) 108 (90 BASE) MCG/ACT inhaler Inhale 2 puffs into the lungs every 6 (six) hours as needed for wheezing or shortness of breath.  Marland Kitchen albuterol (PROVENTIL) (2.5 MG/3ML) 0.083% nebulizer solution USE 1 VIAL VIA NEBULIZER  EVERY 4 TO 6 HOURS AS  NEEDED (Patient taking differently: Take 2.5 mg by nebulization every 4 (four) hours as needed for wheezing or shortness of breath. )  . atorvastatin (LIPITOR) 10 MG tablet Take 1 tab po daily  . calcium-vitamin D (OSCAL-500) 500-400 MG-UNIT tablet Take 1 tablet by mouth daily.   . cetirizine (ZYRTEC) 10 MG tablet Take 10 mg by mouth daily.  . citalopram (CELEXA) 40 MG tablet Take 1 tablet (40 mg total) by mouth daily.  . clopidogrel (PLAVIX) 75 MG tablet Take 75 mg by mouth daily.   Marland Kitchen diltiazem (CARDIZEM CD) 120 MG 24 hr capsule Take by mouth daily.   .  fluticasone (FLONASE) 50 MCG/ACT nasal spray USE 1 SPRAY IN EACH NOSTRIL TWO TIMES DAILY (Patient taking differently: Place 1 spray into both nostrils daily. )  . isosorbide mononitrate (IMDUR) 30 MG 24 hr tablet Take 30 mg by mouth daily.  Marland Kitchen levothyroxine (SYNTHROID) 100 MCG tablet Take 1 tablet (100 mcg total) by mouth daily.  Marland Kitchen lisinopril (PRINIVIL,ZESTRIL) 10 MG tablet Take 10 mg by mouth daily.  . metoprolol tartrate (LOPRESSOR) 25 MG tablet Take 12.5 mg by mouth daily.   . nitroGLYCERIN (NITROSTAT) 0.4 MG SL tablet Place 0.4 mg under the tongue every 5 (five) minutes as needed for chest pain.  . pantoprazole (PROTONIX) 40 MG tablet TAKE 1 TABLET BY MOUTH  DAILY  . saccharomyces boulardii (FLORASTOR) 250 MG capsule Take 1 capsule (250 mg total) by mouth 2 (two) times daily.  . traMADol (ULTRAM) 50 MG tablet Take one tab po bid for pain  . [DISCONTINUED] diltiazem (CARDIZEM) 30 MG tablet Take 30 mg by mouth daily as needed (rapid heart beat).  . [DISCONTINUED] montelukast (SINGULAIR) 10 MG tablet Take 1 tablet (10 mg total) by mouth at bedtime. (Patient not taking: Reported on 06/27/2019)  . [DISCONTINUED] simethicone (GAS-X) 80 MG chewable tablet Chew 1 tablet (80 mg total) by mouth 4 (four) times daily as needed for flatulence.  . [EXPIRED] cyanocobalamin ((VITAMIN B-12)) injection 1,000 mcg    No facility-administered encounter  medications on file as of 06/26/2019.     Surgical History: Past Surgical History:  Procedure Laterality Date  . ABDOMINAL HYSTERECTOMY    . APPENDECTOMY    . CARDIAC CATHETERIZATION Left 09/26/2015   Procedure: Left Heart Cath and Coronary Angiography;  Surgeon: Teodoro Spray, MD;  Location: [place] [facility];  Service: Cardiovascular;  Laterality: Left;  . CHOLECYSTECTOMY N/A 12/22/2016   Procedure: LAPAROSCOPIC CHOLECYSTECTOMY WITH INTRAOPERATIVE CHOLANGIOGRAM;  Surgeon: Jovita Kussmaul, MD;  Location: [place];  Service: General;  Laterality: N/A;  . CORONARY  STENT INTERVENTION N/A 04/13/2018   Procedure: CORONARY STENT INTERVENTION;  Surgeon: Yolonda Kida, MD;  Location: [place] [facility];  Service: Cardiovascular;  Laterality: N/A;  . ERCP N/A 12/20/2016   Procedure: ENDOSCOPIC RETROGRADE CHOLANGIOPANCREATOGRAPHY (ERCP);  Surgeon: Teena Irani, MD;  Location: [facility];  Service: Endoscopy;  Laterality: N/A;  . HYSTEROTOMY    . LEFT HEART CATH AND CORONARY ANGIOGRAPHY Left 04/13/2018   Procedure: LEFT HEART CATH AND CORONARY ANGIOGRAPHY;  Surgeon: Teodoro Spray, MD;  Location: [place] [facility];  Service: Cardiovascular;  Laterality: Left;    Medical History: Past Medical History:  Diagnosis Date  . Bipolar affective disorder ([place])   . Coronary artery disease   . Hyperlipidemia   . Hypertension   . Hypothyroid   . Myocardial infarction ([place])   . Stroke [facility]) 11/15/2016   "right side weaker since" (12/20/2016)    Family History: Family History  Problem Relation Age of Onset  . CAD Other     Social History   Socioeconomic History  . Marital status: Married    Spouse name: Not on file  . Number of children: Not on file  . Years of education: Not on file  . Highest education level: Not on file  Occupational History  . Not on file  Social Needs  . Financial resource strain: Not on file  . Food insecurity    Worry: Not on file    Inability: Not on file  . Transportation needs    Medical: Not on file    Non-medical: Not on file  Tobacco Use  . Smoking status: Current Every Day Smoker    Packs/day: 0.50    Years: 45.00    Pack years: 22.50  . Smokeless tobacco: Never Used  . Tobacco comment: was up to two packs a day  Substance and Sexual Activity  . Alcohol use: No  . Drug use: No  . Sexual activity: Yes  Lifestyle  . Physical activity    Days per week: Not on file    Minutes per session: Not on file  . Stress: Not on file  Relationships  . Social Herbalist on phone: Not on file    Gets  together: Not on file    Attends religious service: Not on file    Active member of club or organization: Not on file    Attends meetings of clubs or organizations: Not on file    Relationship status: Not on file  . Intimate partner violence    Fear of current or ex partner: Not on file    Emotionally abused: Not on file    Physically abused: Not on file    Forced sexual activity: Not on file  Other Topics Concern  . Not on file  Social History Narrative  . Not on file      Review of Systems  Constitutional: Negative for chills, fatigue and  unexpected weight change.  HENT: Negative for congestion, rhinorrhea, sneezing and sore throat.   Eyes: Negative for photophobia, pain and redness.  Respiratory: Negative for cough, chest tightness and shortness of breath.   Cardiovascular: Negative for chest pain and palpitations.  Gastrointestinal: Negative for abdominal pain, constipation, diarrhea, nausea and vomiting.  Endocrine: Negative.   Genitourinary: Negative for dysuria and frequency.  Musculoskeletal: Negative for arthralgias, back pain, joint swelling and neck pain.  Skin: Negative for rash.  Allergic/Immunologic: Negative.   Neurological: Negative for tremors and numbness.  Hematological: Negative for adenopathy. Does not bruise/bleed easily.  Psychiatric/Behavioral: Negative for behavioral problems and sleep disturbance. The patient is not nervous/anxious.     Vital Signs: BP (!) 122/59   Pulse 68   Temp (!) 97.2 F (36.2 C)   Resp 16   Ht 5' (1.524 m)   Wt 139 lb 9.6 oz (63.3 kg)   SpO2 96%   BMI 27.26 kg/m    Physical Exam Vitals signs and nursing note reviewed.  Constitutional:      General: She is not in acute distress.    Appearance: She is well-developed. She is not diaphoretic.  HENT:     Head: Normocephalic and atraumatic.     Mouth/Throat:     Pharynx: No oropharyngeal exudate.  Eyes:     Pupils: Pupils are equal, round, and reactive to light.   Neck:     Musculoskeletal: Normal range of motion and neck supple.     Thyroid: No thyromegaly.     Vascular: No JVD.     Trachea: No tracheal deviation.  Cardiovascular:     Rate and Rhythm: Normal rate and regular rhythm.     Heart sounds: Normal heart sounds. No murmur. No friction rub. No gallop.   Pulmonary:     Effort: Pulmonary effort is normal. No respiratory distress.     Breath sounds: Normal breath sounds. No wheezing or rales.  Chest:     Chest wall: No tenderness.  Abdominal:     Palpations: Abdomen is soft.     Tenderness: There is no abdominal tenderness. There is no guarding.  Musculoskeletal: Normal range of motion.  Lymphadenopathy:     Cervical: No cervical adenopathy.  Skin:    General: Skin is warm and dry.  Neurological:     Mental Status: She is alert and oriented to person, place, and time.     Cranial Nerves: No cranial nerve deficit.  Psychiatric:        Behavior: Behavior normal.        Thought Content: Thought content normal.        Judgment: Judgment normal.      Assessment/Plan: 1. B12 deficiency Start weekly b12 injections at this time. Plan for 3 weeks, and then monthly injections for 2-3 months before rechecking levels.  - cyanocobalamin ((VITAMIN B-12)) injection 1,000 mcg  2. Essential hypertension Controlled, continue present management.   3. Mixed hyperlipidemia Stable, continue Lipitor.   4. Nicotine dependence with current use Smoking cessation counseling: 1. Pt acknowledges the risks of long term smoking, she will try to quite smoking. 2. Options for different medications including nicotine products, chewing gum, patch etc, Wellbutrin and Chantix is discussed 3. Goal and date of compete cessation is discussed 4. Total time spent in smoking cessation is 15 min.  5. Impaired fasting blood sugar Her fasting serum glucose was slightly elevated at lab draw 106 mg/dl.  A1C is 5.4.   - POCT HgB A1C  General Counseling: Carol [name] understanding of the findings of todays visit and agrees with plan of treatment. I have discussed any further diagnostic evaluation that may be needed or ordered today. We also reviewed her medications today. she has been encouraged to call the office with any questions or concerns that should arise related to todays visit.    Orders Placed This Encounter  Procedures  . POCT HgB A1C    Meds ordered this encounter  Medications  . cyanocobalamin ((VITAMIN B-12)) injection 1,000 mcg    Time spent: 25  Minutes   This patient was seen by Orson Gear AGNP-C in Collaboration with Dr Lavera Guise as a part of collaborative care agreement     Kendell Bane AGNP-C Internal medicine

## 2019-06-27 ENCOUNTER — Other Ambulatory Visit: Payer: Self-pay | Admitting: Neurosurgery

## 2019-06-27 ENCOUNTER — Telehealth: Payer: Self-pay

## 2019-06-27 DIAGNOSIS — M5412 Radiculopathy, cervical region: Secondary | ICD-10-CM

## 2019-06-27 NOTE — Telephone Encounter (Signed)
Spoke with patient to review her medications and drug allergies before she is scheduled for a myelogram.  She was informed she will need to hold Plavix for five days once we obtain clearance from Dr. Ubaldo Glassing, and she will need to hold Celexa and Tramadol for 48 hours before, and 24 hours after, the myelogram.  Also, she will be here about two hours, need a driver and will need to be on strict bedrest for 24 hours after the procedure.

## 2019-07-02 ENCOUNTER — Ambulatory Visit: Payer: Medicare Other

## 2019-07-03 ENCOUNTER — Ambulatory Visit
Admission: RE | Admit: 2019-07-03 | Discharge: 2019-07-03 | Disposition: A | Discharge status: Home / Self Care | Payer: Medicare Other | Source: Ambulatory Visit | Attending: Neurosurgery | Admitting: Neurosurgery

## 2019-07-03 ENCOUNTER — Ambulatory Visit: Payer: Medicare Other

## 2019-07-03 ENCOUNTER — Other Ambulatory Visit: Payer: Self-pay

## 2019-07-03 DIAGNOSIS — M4802 Spinal stenosis, cervical region: Secondary | ICD-10-CM | POA: Diagnosis not present

## 2019-07-03 DIAGNOSIS — M5412 Radiculopathy, cervical region: Secondary | ICD-10-CM

## 2019-07-03 DIAGNOSIS — M50322 Other cervical disc degeneration at C5-C6 level: Secondary | ICD-10-CM | POA: Diagnosis not present

## 2019-07-03 DIAGNOSIS — M47812 Spondylosis without myelopathy or radiculopathy, cervical region: Secondary | ICD-10-CM | POA: Diagnosis not present

## 2019-07-03 DIAGNOSIS — M50222 Other cervical disc displacement at C5-C6 level: Secondary | ICD-10-CM | POA: Diagnosis not present

## 2019-07-03 MED ORDER — MEPERIDINE HCL 100 MG/ML IJ SOLN
75.0000 mg | Freq: Once | INTRAMUSCULAR | Status: AC
  Administered 2019-07-03: 75 mg via INTRAMUSCULAR

## 2019-07-03 MED ORDER — ONDANSETRON HCL 4 MG/2ML IJ SOLN
4.0000 mg | Freq: Once | INTRAMUSCULAR | Status: AC
  Administered 2019-07-03: 4 mg via INTRAMUSCULAR

## 2019-07-03 MED ORDER — DIAZEPAM 5 MG PO TABS
5.0000 mg | ORAL_TABLET | Freq: Once | ORAL | Status: AC
  Administered 2019-07-03: 5 mg via ORAL

## 2019-07-03 MED ORDER — IOPAMIDOL (ISOVUE-M 300) INJECTION 61%
10.0000 mL | Freq: Once | INTRAMUSCULAR | Status: AC
  Administered 2019-07-03: 10 mL via INTRATHECAL

## 2019-07-03 NOTE — Discharge Instructions (Signed)
Myelogram Discharge Instructions  1. Go home and rest quietly for the next 24 hours.  It is important to lie flat for the next 24 hours.  Get up only to go to the restroom.  You may lie in the bed or on a couch on your back, your stomach, your left side or your right side.  You may have one pillow under your head.  You may have pillows between your knees while you are on your side or under your knees while you are on your back.  2. DO NOT drive today.  Recline the seat as far back as it will go, while still wearing your seat belt, on the way home.  3. You may get up to go to the bathroom as needed.  You may sit up for 10 minutes to eat.  You may resume your normal diet and medications unless otherwise indicated.  Drink lots of extra fluids today and tomorrow.  4. The incidence of headache, nausea, or vomiting is about 5% (one in 20 patients).  If you develop a headache, lie flat and drink plenty of fluids until the headache goes away.  Caffeinated beverages may be helpful.  If you develop severe nausea and vomiting or a headache that does not go away with flat bed rest, call [phone].  5. You may resume normal activities after your 24 hours of bed rest is over; however, do not exert yourself strongly or do any heavy lifting tomorrow. If when you get up you have a headache when standing, go back to bed and force fluids for another 24 hours.  6. Call your physician for a follow-up appointment.  The results of your myelogram will be sent directly to your physician by the following day.  7. If you have any questions or if complications develop after you arrive home, please call [phone].  Discharge instructions have been explained to the patient.  The patient, or the person responsible for the patient, fully understands these instructions.  YOU MAY RESTART YOUR PLAVIX TODAY. YOU MAY RESTART YOUR TRAMADOL AND CELEXA TOMORROW 07/04/2019 AT 10:30AM.

## 2019-07-10 ENCOUNTER — Ambulatory Visit (INDEPENDENT_AMBULATORY_CARE_PROVIDER_SITE_OTHER): Payer: Medicare Other

## 2019-07-10 ENCOUNTER — Other Ambulatory Visit: Payer: Self-pay

## 2019-07-10 DIAGNOSIS — E538 Deficiency of other specified B group vitamins: Secondary | ICD-10-CM

## 2019-07-10 MED ORDER — CYANOCOBALAMIN 1000 MCG/ML IJ SOLN
1000.0000 ug | Freq: Once | INTRAMUSCULAR | Status: AC
  Administered 2019-07-10: 1000 ug via INTRAMUSCULAR

## 2019-07-17 ENCOUNTER — Ambulatory Visit: Payer: Medicare Other

## 2019-07-18 DIAGNOSIS — M542 Cervicalgia: Secondary | ICD-10-CM | POA: Diagnosis not present

## 2019-07-18 DIAGNOSIS — I10 Essential (primary) hypertension: Secondary | ICD-10-CM | POA: Diagnosis not present

## 2019-07-18 DIAGNOSIS — M25511 Pain in right shoulder: Secondary | ICD-10-CM | POA: Diagnosis not present

## 2019-07-18 DIAGNOSIS — G5601 Carpal tunnel syndrome, right upper limb: Secondary | ICD-10-CM | POA: Diagnosis not present

## 2019-07-18 DIAGNOSIS — M5412 Radiculopathy, cervical region: Secondary | ICD-10-CM | POA: Diagnosis not present

## 2019-07-24 ENCOUNTER — Other Ambulatory Visit: Payer: Self-pay

## 2019-07-24 ENCOUNTER — Ambulatory Visit: Payer: Medicare Other

## 2019-07-24 ENCOUNTER — Telehealth: Payer: Self-pay

## 2019-07-26 ENCOUNTER — Other Ambulatory Visit: Payer: Self-pay

## 2019-07-26 ENCOUNTER — Other Ambulatory Visit: Payer: Self-pay | Admitting: Adult Health

## 2019-07-26 MED ORDER — CYANOCOBALAMIN 1000 MCG/ML IJ SOLN: 1000.0000 ug | INTRAMUSCULAR | 0 refills | Status: DC

## 2019-07-26 MED ORDER — "SYRINGE/NEEDLE (DISP) 23G X 1"" 1 ML MISC": 1.0000 | 0 refills | Status: DC

## 2019-07-26 NOTE — Progress Notes (Signed)
Sent RX for patients b12 injections weekly x 3 weeks, and monthly x 3 months for her daughter in law Loss adjuster, chartered) to give her.  She will follow up after cycle is completed.

## 2019-07-26 NOTE — Telephone Encounter (Signed)
If the patient has someone that can give her the injections at home, then yes she can do the b12 at home.

## 2019-07-26 NOTE — Telephone Encounter (Signed)
Pt has a daughter in law who is a Marine scientist that she lives with. Sent in rx for b12 injections.

## 2019-07-27 ENCOUNTER — Other Ambulatory Visit: Payer: Self-pay

## 2019-08-01 DIAGNOSIS — M67911 Unspecified disorder of synovium and tendon, right shoulder: Secondary | ICD-10-CM | POA: Diagnosis not present

## 2019-08-31 ENCOUNTER — Telehealth: Payer: Self-pay

## 2019-08-31 NOTE — Telephone Encounter (Signed)
Confirmed virtual visit with patient. klh 

## 2019-09-04 ENCOUNTER — Encounter: Payer: Self-pay | Admitting: Adult Health

## 2019-09-04 ENCOUNTER — Ambulatory Visit (INDEPENDENT_AMBULATORY_CARE_PROVIDER_SITE_OTHER): Payer: Medicare Other | Admitting: Adult Health

## 2019-09-04 VITALS — Ht 60.0 in | Wt 140.0 lb

## 2019-09-04 DIAGNOSIS — F17200 Nicotine dependence, unspecified, uncomplicated: Secondary | ICD-10-CM

## 2019-09-04 DIAGNOSIS — E538 Deficiency of other specified B group vitamins: Secondary | ICD-10-CM

## 2019-09-04 DIAGNOSIS — I10 Essential (primary) hypertension: Secondary | ICD-10-CM

## 2019-09-04 DIAGNOSIS — E782 Mixed hyperlipidemia: Secondary | ICD-10-CM

## 2019-09-04 NOTE — Progress Notes (Signed)
[facility] [place], [place] [address]  Internal MEDICINE  Telephone Visit  Patient Name: Carol [name]  [id number]  [id number]  Date of Service: 09/04/2019  I connected with the patient at [phone] by telephone and verified the patients identity using two identifiers.   I discussed the limitations, risks, security and privacy concerns of performing an evaluation and management service by telephone and the availability of in person appointments. I also discussed with the patient that there may be a patient responsible charge related to the service.  The patient expressed understanding and agrees to proceed.    Chief Complaint  Patient presents with  . Telephone Assessment  . Telephone Screen  . Hypertension  . Hyperlipidemia  . Quality Metric Gaps    PNEUMONIA shot     HPI  Pt seen via video. She is seen for follow up on B12 deficiency, HTN, hyperthyroid, and HLD.  She denies any recent issues with blood pressure. Denies Chest pain, Shortness of breath, palpitations, headache, or blurred vision. She is currently taking Lipitor with good results. She has not had a B12 injection in    Current Medication: Outpatient Encounter Medications as of 09/04/2019  Medication Sig  . albuterol (PROVENTIL HFA;VENTOLIN HFA) 108 (90 BASE) MCG/ACT inhaler Inhale 2 puffs into the lungs every 6 (six) hours as needed for wheezing or shortness of breath.  Marland Kitchen albuterol (PROVENTIL) (2.5 MG/3ML) 0.083% nebulizer solution USE 1 VIAL VIA NEBULIZER  EVERY 4 TO 6 HOURS AS  NEEDED (Patient taking differently: Take 2.5 mg by nebulization every 4 (four) hours as needed for wheezing or shortness of breath. )  . atorvastatin (LIPITOR) 10 MG tablet Take 1 tab po daily  . calcium-vitamin D (OSCAL-500) 500-400 MG-UNIT tablet Take 1 tablet by mouth daily.   . cetirizine (ZYRTEC) 10 MG tablet Take 10 mg by mouth daily.  . citalopram (CELEXA) 40 MG tablet Take 1 tablet (40 mg total) by mouth daily.  .  clopidogrel (PLAVIX) 75 MG tablet Take 75 mg by mouth daily.   . cyanocobalamin (,VITAMIN B-12,) 1000 MCG/ML injection Inject 1 mL (1,000 mcg total) into the muscle once a week.  . cyanocobalamin (,VITAMIN B-12,) 1000 MCG/ML injection Inject 1 mL (1,000 mcg total) into the muscle every 30 (thirty) days.  Marland Kitchen diltiazem (CARDIZEM CD) 120 MG 24 hr capsule Take by mouth daily.   . fluticasone (FLONASE) 50 MCG/ACT nasal spray USE 1 SPRAY IN EACH NOSTRIL TWO TIMES DAILY (Patient taking differently: Place 1 spray into both nostrils daily. )  . isosorbide mononitrate (IMDUR) 30 MG 24 hr tablet Take 30 mg by mouth daily.  Marland Kitchen levothyroxine (SYNTHROID) 100 MCG tablet Take 1 tablet (100 mcg total) by mouth daily.  Marland Kitchen lisinopril (PRINIVIL,ZESTRIL) 10 MG tablet Take 10 mg by mouth daily.  Marland Kitchen lubiprostone (AMITIZA) 8 MCG capsule Take by mouth.  . metoprolol tartrate (LOPRESSOR) 25 MG tablet Take 12.5 mg by mouth daily.   . nitroGLYCERIN (NITROSTAT) 0.4 MG SL tablet Place 0.4 mg under the tongue every 5 (five) minutes as needed for chest pain.  . pantoprazole (PROTONIX) 40 MG tablet TAKE 1 TABLET BY MOUTH  DAILY  . saccharomyces boulardii (FLORASTOR) 250 MG capsule Take 1 capsule (250 mg total) by mouth 2 (two) times daily.  . SYRINGE/NEEDLE, DISP, 1 ML 23G X 1" 1 ML MISC 1 each by Does not apply route once a week.  . traMADol (ULTRAM) 50 MG tablet Take one tab po bid for pain  No facility-administered encounter medications on file as of 09/04/2019.    Surgical History: Past Surgical History:  Procedure Laterality Date  . ABDOMINAL HYSTERECTOMY    . APPENDECTOMY    . CARDIAC CATHETERIZATION Left 09/26/2015   Procedure: Left Heart Cath and Coronary Angiography;  Surgeon: Teodoro Spray, MD;  Location: [place] [facility];  Service: Cardiovascular;  Laterality: Left;  . CHOLECYSTECTOMY N/A 12/22/2016   Procedure: LAPAROSCOPIC CHOLECYSTECTOMY WITH INTRAOPERATIVE CHOLANGIOGRAM;  Surgeon: Jovita Kussmaul, MD;   Location: [place];  Service: General;  Laterality: N/A;  . CORONARY STENT INTERVENTION N/A 04/13/2018   Procedure: CORONARY STENT INTERVENTION;  Surgeon: Yolonda Kida, MD;  Location: [place] [facility];  Service: Cardiovascular;  Laterality: N/A;  . ERCP N/A 12/20/2016   Procedure: ENDOSCOPIC RETROGRADE CHOLANGIOPANCREATOGRAPHY (ERCP);  Surgeon: Teena Irani, MD;  Location: [facility];  Service: Endoscopy;  Laterality: N/A;  . HYSTEROTOMY    . LEFT HEART CATH AND CORONARY ANGIOGRAPHY Left 04/13/2018   Procedure: LEFT HEART CATH AND CORONARY ANGIOGRAPHY;  Surgeon: Teodoro Spray, MD;  Location: [place] [facility];  Service: Cardiovascular;  Laterality: Left;    Medical History: Past Medical History:  Diagnosis Date  . Bipolar affective disorder ([place])   . Coronary artery disease   . Hyperlipidemia   . Hypertension   . Hypothyroid   . Myocardial infarction ([place])   . Stroke [facility]) 11/15/2016   "right side weaker since" (12/20/2016)    Family History: Family History  Problem Relation Age of Onset  . CAD Other     Social History   Socioeconomic History  . Marital status: Married    Spouse name: Not on file  . Number of children: Not on file  . Years of education: Not on file  . Highest education level: Not on file  Occupational History  . Not on file  Tobacco Use  . Smoking status: Current Every Day Smoker    Packs/day: 0.50    Years: 45.00    Pack years: 22.50  . Smokeless tobacco: Never Used  . Tobacco comment: was up to two packs a day  Substance and Sexual Activity  . Alcohol use: No  . Drug use: No  . Sexual activity: Yes  Other Topics Concern  . Not on file  Social History Narrative  . Not on file   Social Determinants of Health   Financial Resource Strain:   . Difficulty of Paying Living Expenses: Not on file  Food Insecurity:   . Worried About Charity fundraiser in the Last Year: Not on file  . Ran Out of Food in the Last Year: Not on file   Transportation Needs:   . Lack of Transportation (Medical): Not on file  . Lack of Transportation (Non-Medical): Not on file  Physical Activity:   . Days of Exercise per Week: Not on file  . Minutes of Exercise per Session: Not on file  Stress:   . Feeling of Stress : Not on file  Social Connections:   . Frequency of Communication with Friends and Family: Not on file  . Frequency of Social Gatherings with Friends and Family: Not on file  . Attends Religious Services: Not on file  . Active Member of Clubs or Organizations: Not on file  . Attends Archivist Meetings: Not on file  . Marital Status: Not on file  Intimate Partner Violence:   . Fear of Current or Ex-Partner: Not on file  . Emotionally Abused:  Not on file  . Physically Abused: Not on file  . Sexually Abused: Not on file      Review of Systems  Constitutional: Negative for chills, fatigue and unexpected weight change.  HENT: Negative for congestion, rhinorrhea, sneezing and sore throat.   Eyes: Negative for photophobia, pain and redness.  Respiratory: Negative for cough, chest tightness and shortness of breath.   Cardiovascular: Negative for chest pain and palpitations.  Gastrointestinal: Negative for abdominal pain, constipation, diarrhea, nausea and vomiting.  Endocrine: Negative.   Genitourinary: Negative for dysuria and frequency.  Musculoskeletal: Negative for arthralgias, back pain, joint swelling and neck pain.  Skin: Negative for rash.  Allergic/Immunologic: Negative.   Neurological: Negative for tremors and numbness.  Hematological: Negative for adenopathy. Does not bruise/bleed easily.  Psychiatric/Behavioral: Negative for behavioral problems and sleep disturbance. The patient is not nervous/anxious.     Vital Signs: Ht 5' (1.524 m)   Wt 140 lb (63.5 kg)   BMI 27.34 kg/m    Observation/Objective:  Well appearing, NAD noted.    Assessment/Plan: 1. Essential hypertension Stable,  continue present management.   2. Mixed hyperlipidemia Stable, continue lipitor  3. B12 deficiency Redraw B12 and iron panel for evaluation. - B12 and Folate Panel - Fe+TIBC+Fer  4. Nicotine dependence with current use Smoking cessation counseling: 1. Pt acknowledges the risks of long term smoking, she will try to quite smoking. 2. Options for different medications including nicotine products, chewing gum, patch etc, Wellbutrin and Chantix is discussed 3. Goal and date of compete cessation is discussed 4. Total time spent in smoking cessation is 15 min.   General Counseling: rachele atmore understanding of the findings of today's phone visit and agrees with plan of treatment. I have discussed any further diagnostic evaluation that may be needed or ordered today. We also reviewed her medications today. she has been encouraged to call the office with any questions or concerns that should arise related to todays visit.    Orders Placed This Encounter  Procedures  . B12 and Folate Panel  . Fe+TIBC+Fer    No orders of the defined types were placed in this encounter.   Time spent: [facility] Internal medicine

## 2019-09-19 ENCOUNTER — Encounter: Payer: Medicare Other | Admitting: Neurology

## 2019-09-23 DIAGNOSIS — B349 Viral infection, unspecified: Secondary | ICD-10-CM | POA: Diagnosis not present

## 2019-09-23 DIAGNOSIS — M791 Myalgia, unspecified site: Secondary | ICD-10-CM | POA: Diagnosis not present

## 2019-09-23 DIAGNOSIS — R197 Diarrhea, unspecified: Secondary | ICD-10-CM | POA: Diagnosis not present

## 2019-10-01 DIAGNOSIS — L820 Inflamed seborrheic keratosis: Secondary | ICD-10-CM | POA: Diagnosis not present

## 2019-10-01 DIAGNOSIS — D229 Melanocytic nevi, unspecified: Secondary | ICD-10-CM | POA: Diagnosis not present

## 2019-10-01 DIAGNOSIS — L578 Other skin changes due to chronic exposure to nonionizing radiation: Secondary | ICD-10-CM | POA: Diagnosis not present

## 2019-10-01 DIAGNOSIS — L821 Other seborrheic keratosis: Secondary | ICD-10-CM | POA: Diagnosis not present

## 2019-10-01 DIAGNOSIS — L814 Other melanin hyperpigmentation: Secondary | ICD-10-CM | POA: Diagnosis not present

## 2019-10-01 DIAGNOSIS — D492 Neoplasm of unspecified behavior of bone, soft tissue, and skin: Secondary | ICD-10-CM | POA: Diagnosis not present

## 2019-10-11 ENCOUNTER — Telehealth: Payer: Self-pay

## 2019-10-11 NOTE — Telephone Encounter (Signed)
Tried contacting patient to confirm appointment on 10/16/2019, no vm. klh

## 2019-10-16 ENCOUNTER — Ambulatory Visit: Payer: Medicare Other | Admitting: Adult Health

## 2019-10-18 ENCOUNTER — Telehealth: Payer: Self-pay

## 2019-10-18 NOTE — Telephone Encounter (Signed)
BILLED MISSED APPOINTMENT FEE FOR 10/16/2019

## 2019-11-14 DIAGNOSIS — H7203 Central perforation of tympanic membrane, bilateral: Secondary | ICD-10-CM | POA: Diagnosis not present

## 2019-11-14 DIAGNOSIS — T162XXA Foreign body in left ear, initial encounter: Secondary | ICD-10-CM | POA: Diagnosis not present

## 2019-11-14 DIAGNOSIS — H906 Mixed conductive and sensorineural hearing loss, bilateral: Secondary | ICD-10-CM | POA: Diagnosis not present

## 2019-11-14 DIAGNOSIS — H6983 Other specified disorders of Eustachian tube, bilateral: Secondary | ICD-10-CM | POA: Diagnosis not present

## 2019-11-19 DIAGNOSIS — H905 Unspecified sensorineural hearing loss: Secondary | ICD-10-CM | POA: Diagnosis not present

## 2019-11-25 ENCOUNTER — Other Ambulatory Visit: Payer: Self-pay

## 2019-11-25 ENCOUNTER — Emergency Department: Payer: Medicare Other

## 2019-11-25 ENCOUNTER — Encounter: Payer: Self-pay | Admitting: Emergency Medicine

## 2019-11-25 ENCOUNTER — Emergency Department
Admission: EM | Admit: 2019-11-25 | Discharge: 2019-11-25 | Disposition: A | Discharge status: Home / Self Care | Payer: Medicare Other | Attending: Emergency Medicine | Admitting: Emergency Medicine

## 2019-11-25 DIAGNOSIS — Z20822 Contact with and (suspected) exposure to covid-19: Secondary | ICD-10-CM | POA: Insufficient documentation

## 2019-11-25 DIAGNOSIS — R079 Chest pain, unspecified: Secondary | ICD-10-CM | POA: Diagnosis not present

## 2019-11-25 DIAGNOSIS — R0602 Shortness of breath: Secondary | ICD-10-CM | POA: Diagnosis not present

## 2019-11-25 DIAGNOSIS — J449 Chronic obstructive pulmonary disease, unspecified: Secondary | ICD-10-CM | POA: Insufficient documentation

## 2019-11-25 DIAGNOSIS — I10 Essential (primary) hypertension: Secondary | ICD-10-CM | POA: Diagnosis not present

## 2019-11-25 DIAGNOSIS — Z955 Presence of coronary angioplasty implant and graft: Secondary | ICD-10-CM | POA: Diagnosis not present

## 2019-11-25 DIAGNOSIS — J40 Bronchitis, not specified as acute or chronic: Secondary | ICD-10-CM

## 2019-11-25 DIAGNOSIS — F17210 Nicotine dependence, cigarettes, uncomplicated: Secondary | ICD-10-CM | POA: Insufficient documentation

## 2019-11-25 DIAGNOSIS — Z79899 Other long term (current) drug therapy: Secondary | ICD-10-CM | POA: Insufficient documentation

## 2019-11-25 DIAGNOSIS — E039 Hypothyroidism, unspecified: Secondary | ICD-10-CM | POA: Diagnosis not present

## 2019-11-25 DIAGNOSIS — Z7902 Long term (current) use of antithrombotics/antiplatelets: Secondary | ICD-10-CM | POA: Insufficient documentation

## 2019-11-25 DIAGNOSIS — I2510 Atherosclerotic heart disease of native coronary artery without angina pectoris: Secondary | ICD-10-CM | POA: Insufficient documentation

## 2019-11-25 DIAGNOSIS — R0789 Other chest pain: Secondary | ICD-10-CM | POA: Diagnosis not present

## 2019-11-25 LAB — CBC
HCT: 34.4 % — ABNORMAL LOW (ref 36.0–46.0)
Hemoglobin: 10.8 g/dL — ABNORMAL LOW (ref 12.0–15.0)
MCH: 29.9 pg (ref 26.0–34.0)
MCHC: 31.4 g/dL (ref 30.0–36.0)
MCV: 95.3 fL (ref 80.0–100.0)
Platelets: 197 10*3/uL (ref 150–400)
RBC: 3.61 MIL/uL — ABNORMAL LOW (ref 3.87–5.11)
RDW: 13.1 % (ref 11.5–15.5)
WBC: 6.7 10*3/uL (ref 4.0–10.5)
nRBC: 0 % (ref 0.0–0.2)

## 2019-11-25 LAB — TROPONIN I (HIGH SENSITIVITY)
Troponin I (High Sensitivity): 5 ng/L (ref <18)
Troponin I (High Sensitivity): 5 ng/L (ref <18)

## 2019-11-25 LAB — BASIC METABOLIC PANEL
Anion gap: 7 (ref 5–15)
BUN: 14 mg/dL (ref 8–23)
CO2: 23 mmol/L (ref 22–32)
Calcium: 9.4 mg/dL (ref 8.9–10.3)
Chloride: 109 mmol/L (ref 98–111)
Creatinine, Ser: 0.71 mg/dL (ref 0.44–1.00)
GFR calc Af Amer: >60 mL/min (ref >60)
GFR calc non Af Amer: >60 mL/min (ref >60)
Glucose, Bld: 91 mg/dL (ref 70–99)
Potassium: 3.8 mmol/L (ref 3.5–5.1)
Sodium: 139 mmol/L (ref 135–145)

## 2019-11-25 MED ORDER — PREDNISONE 20 MG PO TABS
60.0000 mg | ORAL_TABLET | Freq: Once | ORAL | Status: AC
  Administered 2019-11-25: 60 mg via ORAL
  Filled 2019-11-25: qty 3

## 2019-11-25 MED ORDER — ACETAMINOPHEN 500 MG PO TABS
1000.0000 mg | ORAL_TABLET | Freq: Once | ORAL | Status: AC
  Administered 2019-11-25: 1000 mg via ORAL
  Filled 2019-11-25: qty 2

## 2019-11-25 MED ORDER — AZITHROMYCIN 250 MG PO TABS: ORAL_TABLET | ORAL | 0 refills | Status: AC

## 2019-11-25 MED ORDER — PREDNISONE 20 MG PO TABS: 40.0000 mg | ORAL_TABLET | Freq: Every day | ORAL | 0 refills | Status: AC

## 2019-11-25 NOTE — ED Triage Notes (Signed)
Pt arrived via POV with c/o L side chest pain that started today, at this time pain is non-radiating. Pt describe the pain as sharp and tight 10/10 pain.  Pt state she had stent placed 1.5 years ago and sees Dr. Ubaldo Glassing.  No distress noted in triage, pt clenching chest at times. Pt also c/o shortness of breath with the chest pain.

## 2019-11-25 NOTE — ED Notes (Signed)
Pt speaking with this RN in NAD, A&Ox4. Reports left sided CP since yesterday that stays in the left chest. Reports "heavy breathing" as well.

## 2019-11-25 NOTE — ED Provider Notes (Signed)
[facility] Emergency Department Provider Note  ____________________________________________   First MD Initiated Contact with Patient 11/25/19 1545     (approximate)  I have reviewed the triage vital signs and the nursing notes.   HISTORY  Chief Complaint Chest Pain    HPI Carol [name] is a 64 y.o. female with hypertension, hyperlipidemia, stroke, prior coronary disease who comes in with chest pain.  Patient reports having some occasional pain in her chest that she takes nitro for.  However today she had pain that started around 8 AM and was more constant, nothing made it better, nothing made it worse.  States that she takes Plavix daily.  States that she has had some congestion, nasal drainage and coughing and she just feels like it is tight when she breathes.  Does not  feel short of breath but states that she will have pain when she pushes on her chest wall or when she coughs.  She states that she is had bronchitis before and this feels similar.  Denies any unilateral leg swelling, recent travel, history of PE.  Denies having coronavirus previously.  Has been fully vaccinated.          Past Medical History:  Diagnosis Date  . Bipolar affective disorder ([place])   . Coronary artery disease   . Hyperlipidemia   . Hypertension   . Hypothyroid   . Myocardial infarction ([place])   . Stroke [facility]) 11/15/2016   "right side weaker since" (12/20/2016)    Patient Active Problem List   Diagnosis Date Noted  . S/P drug eluting coronary stent placement 04/13/2018  . SVT (supraventricular tachycardia) ([place]) 04/10/2018  . Hyperlipidemia, unspecified 12/01/2017  . Choledocholithiasis with acute cholecystitis 12/19/2016  . Coronary artery disease 12/19/2016  . Essential hypertension 12/19/2016  . Hypothyroidism 12/19/2016  . History of stroke 12/19/2016  . Migraine 06/17/2016  . Cognitive deficit, post-stroke 05/18/2015  . Chest pain 05/12/2015  . Right facial  numbness 03/30/2015  . Lithium intoxication 02/27/2015  . Bipolar disorder ([place]) 02/27/2015  . CVA (cerebral infarction) 02/26/2015  . Unstable angina ([place]) 02/03/2015  . Tobacco use 01/19/2013  . Temporomandibular joint disorder 01/02/2013  . Tarsal tunnel syndrome 07/21/2012  . 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 Plantar fascial fibromatosis 07/21/2012  . 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 ABDOMINAL HYSTERECTOMY    . APPENDECTOMY    . CARDIAC CATHETERIZATION Left 09/26/2015   Procedure: Left Heart Cath and Coronary Angiography;  Surgeon: Teodoro Spray, MD;  Location: [place] [facility];  Service: Cardiovascular;  Laterality: Left;  . CHOLECYSTECTOMY N/A 12/22/2016   Procedure: LAPAROSCOPIC CHOLECYSTECTOMY WITH INTRAOPERATIVE CHOLANGIOGRAM;  Surgeon: Jovita Kussmaul, MD;  Location: [place];  Service: General;  Laterality: N/A;  . CORONARY STENT INTERVENTION N/A 04/13/2018   Procedure: CORONARY STENT INTERVENTION;  Surgeon: Yolonda Kida, MD;  Location: [place] [facility];  Service: Cardiovascular;  Laterality: N/A;  . ERCP N/A 12/20/2016   Procedure: ENDOSCOPIC RETROGRADE CHOLANGIOPANCREATOGRAPHY (ERCP);  Surgeon: Teena Irani, MD;  Location: [facility] ENDOSCOPY;  Service: Endoscopy;  Laterality: N/A;  . HYSTEROTOMY    . LEFT HEART CATH AND CORONARY ANGIOGRAPHY Left 04/13/2018   Procedure: LEFT HEART CATH AND CORONARY ANGIOGRAPHY;  Surgeon: Teodoro Spray, MD;  Location: [place] [facility];  Service: Cardiovascular;  Laterality: Left;    Prior to Admission medications   Medication Sig Start Date  End Date Taking? Authorizing Provider  albuterol (PROVENTIL HFA;VENTOLIN HFA) 108 (90 BASE) MCG/ACT inhaler Inhale 2 puffs into the lungs every 6 (six) hours as needed for wheezing or shortness of breath.    [provider]  albuterol (PROVENTIL) (2.5 MG/3ML) 0.083% nebulizer solution USE 1  VIAL VIA NEBULIZER  EVERY 4 TO 6 HOURS AS  NEEDED Patient taking differently: Take 2.5 mg by nebulization every 4 (four) hours as needed for wheezing or shortness of breath.  09/07/17   Lavera Guise, MD  atorvastatin (LIPITOR) 10 MG tablet Take 1 tab po daily 04/11/19   [provider]  calcium-vitamin D (OSCAL-500) 500-400 MG-UNIT tablet Take 1 tablet by mouth daily.     [provider]  cetirizine (ZYRTEC) 10 MG tablet Take 10 mg by mouth daily.    [provider]  citalopram (CELEXA) 40 MG tablet Take 1 tablet (40 mg total) by mouth daily. 06/04/19   Lavera Guise, MD  clopidogrel (PLAVIX) 75 MG tablet Take 75 mg by mouth daily.     [provider]  cyanocobalamin (,VITAMIN B-12,) 1000 MCG/ML injection Inject 1 mL (1,000 mcg total) into the muscle once a week. 07/26/19   [place]ar[place], Audie Clear, NP  cyanocobalamin (,VITAMIN B-12,) 1000 MCG/ML injection Inject 1 mL (1,000 mcg total) into the muscle every 30 (thirty) days. 07/26/19   Kendell Bane, NP  diltiazem (CARDIZEM CD) 120 MG 24 hr capsule Take by mouth daily.  01/16/19 01/16/20  [provider]  fluticasone (FLONASE) 50 MCG/ACT nasal spray USE 1 SPRAY IN EACH NOSTRIL TWO TIMES DAILY Patient taking differently: Place 1 spray into both nostrils daily.  09/07/17   Lavera Guise, MD  isosorbide mononitrate (IMDUR) 30 MG 24 hr tablet Take 30 mg by mouth daily.    [provider]  levothyroxine (SYNTHROID) 100 MCG tablet Take 1 tablet (100 mcg total) by mouth daily. 06/04/19   Lavera Guise, MD  lisinopril (PRINIVIL,ZESTRIL) 10 MG tablet Take 10 mg by mouth daily.    [provider]  lubiprostone (AMITIZA) 8 MCG capsule Take by mouth.    [provider]  metoprolol tartrate (LOPRESSOR) 25 MG tablet Take 12.5 mg by mouth daily.     [provider]  nitroGLYCERIN (NITROSTAT) 0.4 MG SL tablet Place 0.4 mg under the tongue every 5 (five) minutes as needed for chest pain.     [provider]  pantoprazole (PROTONIX) 40 MG tablet TAKE 1 TABLET BY MOUTH  DAILY 04/04/19   Kendell Bane, NP  saccharomyces boulardii (FLORASTOR) 250 MG capsule Take 1 capsule (250 mg total) by mouth 2 (two) times daily. 12/23/16   Lavina Hamman, MD  SYRINGE/NEEDLE, DISP, 1 ML 23G X 1" 1 ML MISC 1 each by Does not apply route once a week. 07/26/19   Kendell Bane, NP  traMADol Veatrice Bourbon) 50 MG tablet Take one tab po bid for pain 06/06/19   Lavera Guise, MD    Allergies Morphine, Sulfa antibiotics, Sulfamethoxazole-trimethoprim, Pregabalin, Asa [aspirin], and Rofecoxib  Family History  Problem Relation Age of Onset  . CAD Other     Social History Social History   Tobacco Use  . Smoking status: Current Every Day Smoker    Packs/day: 0.50    Years: 45.00    Pack years: 22.50  . Smokeless tobacco: Never Used  . Tobacco comment: was up to two packs a day  Substance Use Topics  . Alcohol use: No  .  Drug use: No      Review of Systems Constitutional: No fever/chills Eyes: No visual changes. ENT: No sore throat.  Positive congestion and nasal drainage Cardiovascular: Positive chest pain Respiratory: Denies shortness of breath. Gastrointestinal: No abdominal pain.  No nausea, no vomiting.  No diarrhea.  No constipation. Genitourinary: Negative for dysuria. Musculoskeletal: Negative for back pain. Skin: Negative for rash. Neurological: Negative for headaches, focal weakness or numbness. All other ROS negative ____________________________________________   PHYSICAL EXAM:  VITAL SIGNS: ED Triage Vitals  Enc Vitals Group     BP 11/25/19 1158 (!) 150/60     Pulse Rate 11/25/19 1158 60     Resp 11/25/19 1158 18     Temp 11/25/19 1158 98.8 F (37.1 C)     Temp Source 11/25/19 1158 Oral     SpO2 11/25/19 1158 96 %     Weight 11/25/19 1159 140 lb (63.5 kg)     Height 11/25/19 1159 5' (1.524 m)     Head Circumference --      Peak Flow --      Pain Score  11/25/19 1158 10     Pain Loc --      Pain Edu? --      Excl. in [place]? --     Constitutional: Alert and oriented. Well appearing and in no acute distress. Eyes: Conjunctivae are normal. EOMI. Head: Atraumatic. Nose: No congestion/rhinnorhea. Mouth/Throat: Mucous membranes are moist.   Neck: No stridor. Trachea Midline. FROM Cardiovascular: Normal rate, regular rhythm. Grossly normal heart sounds.  Good peripheral circulation.  Chest wall tenderness with palpation. Respiratory: Normal respiratory effort.  No retractions. Lungs CTAB. Gastrointestinal: Soft and nontender. No distention. No abdominal bruits.  Musculoskeletal: No lower extremity tenderness nor edema.  No joint effusions. Neurologic:  Normal speech and language. No gross focal neurologic deficits are appreciated.  Skin:  Skin is warm, dry and intact. No rash noted. Psychiatric: Mood and affect are normal. Speech and behavior are normal. GU: Deferred   ____________________________________________   LABS (all labs ordered are listed, but only abnormal results are displayed)  Labs Reviewed  CBC - Abnormal; Notable for the following components:      Result Value   RBC 3.61 (*)    Hemoglobin 10.8 (*)    HCT 34.4 (*)    All other components within normal limits  SARS CORONAVIRUS 2 (TAT 6-24 HRS)  BASIC METABOLIC PANEL  TROPONIN I (HIGH SENSITIVITY)  TROPONIN I (HIGH SENSITIVITY)   ____________________________________________   ED ECG REPORT I, Vanessa Ball, the attending physician, personally viewed and interpreted this ECG.  EKG is sinus bradycardia rate of 57, no ST elevations, no T wave inversions, normal intervals ____________________________________________  RADIOLOGY Robert Bellow, personally viewed and evaluated these images (plain radiographs) as part of my medical decision making, as well as reviewing the written report by the radiologist.  ED MD interpretation: No pneumonia  Official radiology  report(s): DG Chest 2 View  Result Date: 11/25/2019 CLINICAL DATA:  Chest pain and shortness of breath. EXAM: CHEST - 2 VIEW COMPARISON:  August 22, 2018 FINDINGS: The heart, hila, and mediastinum are normal. No pneumothorax. No nodules or masses. No focal infiltrates. IMPRESSION: No active cardiopulmonary disease. Electronically Signed   By: Dorise Bullion III M.D   On: 11/25/2019 12:53    ____________________________________________   PROCEDURES  Procedure(s) performed (including Critical Care):  Procedures   ____________________________________________   INITIAL IMPRESSION / ASSESSMENT AND PLAN / ED COURSE  Carol [name] was evaluated in Emergency Department on 11/25/2019 for the symptoms described in the history of present illness. She was evaluated in the context of the global COVID-19 pandemic, which necessitated consideration that the patient might be at risk for infection with the SARS-CoV-2 virus that causes COVID-19. Institutional protocols and algorithms that pertain to the evaluation of patients at risk for COVID-19 are in a state of rapid change based on information released by regulatory bodies including the CDC and federal and state organizations. These policies and algorithms were followed during the patient's care in the ED.    Most Likely DDx:  -MSK (atypical chest pain) and given patient's other symptoms it seems to be more viral or like an early bronchitis picture given her significant smoking history and history of COPD and bronchitis as well as the chest pain being reproducible on exam..  But will get cardiac markers to evaluate for ACS given risk factors/age.   DDx that was also considered d/t potential to cause harm, but was found less likely based on history and physical (as detailed above): -PNA (no fevers, cough but CXR to evaluate) -PNX (reassured with equal b/l breath sounds, CXR to evaluate) -Symptomatic anemia (will get H&H) -Pulmonary embolism as no sob  at rest, not pleuritic in nature, no hypoxia, we discussed the possibility of PE and she had no other risk factors we have elected to hold off on D-dimer at this time.  Patient understands that if she develops worsening shortness of breath she should return to ER however her symptoms to be seem to be more consistent with a viral illness and her chest pain is reproducible -Aortic Dissection as no tearing pain and no radiation to the mid back, pulses equal -Pericarditis no rub on exam, EKG changes or hx to suggest dx -Tamponade (no notable SOB, tachycardic, hypotensive) -Esophageal rupture (no h/o diffuse vomitting/no crepitus)  Labs are reassuring.  No electrolyte abnormalities.  No white count elevation to suggest infection.  Hemoglobin is 10.8 which is around baseline of 11.  His troponin was 5.  Chest x-ray without evidence of pneumonia  Repeat troponin is stable.  Discussed with patient her oxygen levels remain well.  Covid swab pending.  She understands to quarantine.  Given patient's significant smoking history we will treat her as a early bronchitis with some steroids and azithromycin.  Patient will continue to use Flonase for the nasal drainage.  And Tylenol for any chest discomfort.  Patient does comfortable with this plan and will return to the ER if develops worsening shortness of breath.  She understands that she should call Dr. Ubaldo Glassing given her significant cardiac history to be seen in the office next week.  I discussed the provisional nature of ED diagnosis, the treatment so far, the ongoing plan of care, follow up appointments and return precautions with the patient and any family or support people present. They expressed understanding and agreed with the plan, discharged home.   ____________________________________________   FINAL CLINICAL IMPRESSION(S) / ED DIAGNOSES   Final diagnoses:  Chest pain, unspecified type  Bronchitis     MEDICATIONS GIVEN DURING THIS  VISIT:  Medications  acetaminophen (TYLENOL) tablet 1,000 mg (has no administration in time range)  predniSONE (DELTASONE) tablet 60 mg (has no administration in time range)     ED Discharge Orders         Ordered    azithromycin (ZITHROMAX Z-PAK) 250 MG tablet     11/25/19 1721    predniSONE (  DELTASONE) 20 MG tablet  Daily     11/25/19 1721           Note:  This document was prepared using Dragon voice recognition software and may include unintentional dictation errors.   Vanessa [place], MD 11/25/19 1721

## 2019-11-25 NOTE — Discharge Instructions (Signed)
Heart markers ruled you out for heart attack although he has significant cardiac history as he should call Dr. Leamon Arnt office on Monday to get follow-up this week.  Take steroids and antibiotics to see if that helps her symptoms.  Stay quarantined until tomorrow for the pending Covid test

## 2019-11-26 LAB — SARS CORONAVIRUS 2 (TAT 6-24 HRS): SARS Coronavirus 2: NEGATIVE

## 2019-11-27 ENCOUNTER — Ambulatory Visit (INDEPENDENT_AMBULATORY_CARE_PROVIDER_SITE_OTHER): Payer: Medicare Other | Admitting: Nurse Practitioner

## 2019-11-27 ENCOUNTER — Encounter: Payer: Self-pay | Admitting: Nurse Practitioner

## 2019-11-27 ENCOUNTER — Other Ambulatory Visit: Payer: Self-pay

## 2019-11-27 VITALS — Temp 100.2°F | Ht 60.0 in | Wt 140.0 lb

## 2019-11-27 DIAGNOSIS — J069 Acute upper respiratory infection, unspecified: Secondary | ICD-10-CM

## 2019-11-27 DIAGNOSIS — R079 Chest pain, unspecified: Secondary | ICD-10-CM | POA: Diagnosis not present

## 2019-11-27 DIAGNOSIS — R112 Nausea with vomiting, unspecified: Secondary | ICD-10-CM | POA: Diagnosis not present

## 2019-11-27 MED ORDER — AMOXICILLIN 875 MG PO TABS: 875.0000 mg | ORAL_TABLET | Freq: Two times a day (BID) | ORAL | 0 refills | Status: DC

## 2019-11-27 MED ORDER — ONDANSETRON 4 MG PO TBDP: 4.0000 mg | ORAL_TABLET | Freq: Three times a day (TID) | ORAL | 0 refills | Status: DC | PRN

## 2019-11-27 NOTE — Progress Notes (Signed)
[facility] [place], [place] [address]  Internal MEDICINE  Telephone Visit  Patient Name: Carol [name]  [id number]  [id number]  Date of Service: 12/08/2019  I connected with the patient at 5:11pm by telephone and verified the patients identity using two identifiers.   I discussed the limitations, risks, security and privacy concerns of performing an evaluation and management service by telephone and the availability of in person appointments. I also discussed with the patient that there may be a patient responsible charge related to the service.  The patient expressed understanding and agrees to proceed.    Chief Complaint  Patient presents with  . Telephone Assessment  . Telephone Screen    negative covid  . Nausea  . Vomiting  . Diarrhea  . Medication Management    given z pak and prednisone at urgent and believes this is the cause of issues  . Bronchitis    The patient has been contacted via telephone for follow up visit due to concerns for spread of novel coronavirus. The patient presents for acute visit. Today, she states that she has nasal congestion, chest congestion, headache, and nausea with vomiting. She states that she had chest rightness and chest pain two nights ago and went to the ER. ECG showed some nonspecific ST changes. There were no significant changes when compared to prior ECGs. She does see cardiology. Chest x-ray was negative for acute cardiopulmonary disease. Test for COVID 19 was negative. She continues to have similar symptoms. She has low grade fever. She states that her nausea is so bad she is really unable to tolerate any thing by mouth.       Current Medication: Outpatient Encounter Medications as of 11/27/2019  Medication Sig  . albuterol (PROVENTIL HFA;VENTOLIN HFA) 108 (90 BASE) MCG/ACT inhaler Inhale 2 puffs into the lungs every 6 (six) hours as needed for wheezing or shortness of breath.  Marland Kitchen albuterol (PROVENTIL) (2.5  MG/3ML) 0.083% nebulizer solution USE 1 VIAL VIA NEBULIZER  EVERY 4 TO 6 HOURS AS  NEEDED (Patient taking differently: Take 2.5 mg by nebulization every 4 (four) hours as needed for wheezing or shortness of breath. )  . atorvastatin (LIPITOR) 10 MG tablet Take 10 mg by mouth daily. Take 1 tab po daily  . [EXPIRED] azithromycin (ZITHROMAX Z-PAK) 250 MG tablet Take 2 tablets (500 mg) on  Day 1,  followed by 1 tablet (250 mg) once daily on Days 2 through 5.  . Calcium Carbonate-Vitamin D 600-200 MG-UNIT CAPS Take 1 tablet by mouth daily.   . citalopram (CELEXA) 40 MG tablet Take 1 tablet (40 mg total) by mouth daily.  . clopidogrel (PLAVIX) 75 MG tablet Take 75 mg by mouth daily.   Marland Kitchen diltiazem (CARDIZEM CD) 120 MG 24 hr capsule Take 120 mg by mouth daily.   . fluticasone (FLONASE) 50 MCG/ACT nasal spray USE 1 SPRAY IN EACH NOSTRIL TWO TIMES DAILY (Patient taking differently: Place 1 spray into both nostrils daily. )  . isosorbide mononitrate (IMDUR) 30 MG 24 hr tablet Take 30 mg by mouth daily.  Marland Kitchen levothyroxine (SYNTHROID) 100 MCG tablet Take 1 tablet (100 mcg total) by mouth daily.  Marland Kitchen lisinopril (PRINIVIL,ZESTRIL) 10 MG tablet Take 10 mg by mouth daily.  Marland Kitchen lubiprostone (AMITIZA) 8 MCG capsule Take 8 mcg by mouth daily as needed for constipation.   . metoprolol tartrate (LOPRESSOR) 25 MG tablet Take 12.5 mg by mouth daily.   . nitroGLYCERIN (NITROSTAT) 0.4 MG SL tablet  Place 0.4 mg under the tongue every 5 (five) minutes as needed for chest pain.  . pantoprazole (PROTONIX) 40 MG tablet TAKE 1 TABLET BY MOUTH  DAILY (Patient taking differently: Take 40 mg by mouth daily. )  . [EXPIRED] predniSONE (DELTASONE) 20 MG tablet Take 2 tablets (40 mg total) by mouth daily for 5 days.  . SYRINGE/NEEDLE, DISP, 1 ML 23G X 1" 1 ML MISC 1 each by Does not apply route once a week.  . [DISCONTINUED] cetirizine (ZYRTEC) 10 MG tablet Take 10 mg by mouth daily.  . [DISCONTINUED] cyanocobalamin (,VITAMIN B-12,) 1000  MCG/ML injection Inject 1 mL (1,000 mcg total) into the muscle once a week. (Patient not taking: Reported on 12/03/2019)  . [DISCONTINUED] cyanocobalamin (,VITAMIN B-12,) 1000 MCG/ML injection Inject 1 mL (1,000 mcg total) into the muscle every 30 (thirty) days. (Patient not taking: Reported on 12/03/2019)  . [DISCONTINUED] saccharomyces boulardii (FLORASTOR) 250 MG capsule Take 1 capsule (250 mg total) by mouth 2 (two) times daily. (Patient not taking: Reported on 12/03/2019)  . [DISCONTINUED] traMADol (ULTRAM) 50 MG tablet Take one tab po bid for pain (Patient not taking: Reported on 12/03/2019)  . amoxicillin (AMOXIL) 875 MG tablet Take 1 tablet (875 mg total) by mouth 2 (two) times daily. (Patient not taking: Reported on 12/03/2019)  . ondansetron (ZOFRAN-ODT) 4 MG disintegrating tablet Take 1 tablet (4 mg total) by mouth every 8 (eight) hours as needed for nausea or vomiting.   No facility-administered encounter medications on file as of 11/27/2019.    Surgical History: Past Surgical History:  Procedure Laterality Date  . ABDOMINAL HYSTERECTOMY    . APPENDECTOMY    . CARDIAC CATHETERIZATION Left 09/26/2015   Procedure: Left Heart Cath and Coronary Angiography;  Surgeon: Teodoro Spray, MD;  Location: [facility];  Service: Cardiovascular;  Laterality: Left;  . CHOLECYSTECTOMY N/A 12/22/2016   Procedure: LAPAROSCOPIC CHOLECYSTECTOMY WITH INTRAOPERATIVE CHOLANGIOGRAM;  Surgeon: Jovita Kussmaul, MD;  Location: [facility];  Service: General;  Laterality: N/A;  . CORONARY STENT INTERVENTION N/A 04/13/2018   Procedure: CORONARY STENT INTERVENTION;  Surgeon: Yolonda Kida, MD;  Location: [facility];  Service: Cardiovascular;  Laterality: N/A;  . ERCP N/A 12/20/2016   Procedure: ENDOSCOPIC RETROGRADE CHOLANGIOPANCREATOGRAPHY (ERCP);  Surgeon: Teena Irani, MD;  Location: [facility];  Service: Endoscopy;  Laterality: N/A;  . HYSTEROTOMY    . LEFT HEART CATH AND CORONARY ANGIOGRAPHY Left  04/13/2018   Procedure: LEFT HEART CATH AND CORONARY ANGIOGRAPHY;  Surgeon: Teodoro Spray, MD;  Location: [facility];  Service: Cardiovascular;  Laterality: Left;    Medical History: Past Medical History:  Diagnosis Date  . Bipolar affective disorder ([place])   . Coronary artery disease   . Hyperlipidemia   . Hypertension   . Hypothyroid   . Myocardial infarction ([place])   . Stroke [facility]) 11/15/2016   "right side weaker since" (12/20/2016)    Family History: Family History  Problem Relation Age of Onset  . CAD Other     Social History   Socioeconomic History  . Marital status: Married    Spouse name: Not on file  . Number of children: Not on file  . Years of education: Not on file  . Highest education level: Not on file  Occupational History  . Not on file  Tobacco Use  . Smoking status: Current Every Day Smoker    Packs/day: 0.50    Years: 45.00    Pack years: 22.50  .  Smokeless tobacco: Never Used  . Tobacco comment: was up to two packs a day  Substance and Sexual Activity  . Alcohol use: No  . Drug use: No  . Sexual activity: Yes  Other Topics Concern  . Not on file  Social History Narrative  . Not on file   Social Determinants of Health   Financial Resource Strain:   . Difficulty of Paying Living Expenses:   Food Insecurity:   . Worried About Charity fundraiser in the Last Year:   . Arboriculturist in the Last Year:   Transportation Needs:   . Film/video editor (Medical):   Marland Kitchen Lack of Transportation (Non-Medical):   Physical Activity:   . Days of Exercise per Week:   . Minutes of Exercise per Session:   Stress:   . Feeling of Stress :   Social Connections:   . Frequency of Communication with Friends and Family:   . Frequency of Social Gatherings with Friends and Family:   . Attends Religious Services:   . Active Member of Clubs or Organizations:   . Attends Archivist Meetings:   Marland Kitchen Marital Status:   Intimate Partner  Violence:   . Fear of Current or Ex-Partner:   . Emotionally Abused:   Marland Kitchen Physically Abused:   . Sexually Abused:       Review of Systems  Constitutional: Positive for chills, fatigue and fever.  HENT: Positive for congestion, ear pain, postnasal drip, rhinorrhea and sinus pain. Negative for sore throat and voice change.   Respiratory: Positive for cough. Negative for wheezing.   Cardiovascular: Positive for chest pain. Negative for palpitations.  Gastrointestinal: Positive for nausea and vomiting. Negative for constipation and diarrhea.  Musculoskeletal: Positive for arthralgias and myalgias.  Skin: Negative.   Allergic/Immunologic: Positive for environmental allergies.  Neurological: Positive for dizziness and headaches.  Hematological: Positive for adenopathy.  Psychiatric/Behavioral: Negative.     Today's Vitals   11/27/19 1607  Temp: 100.2 F (37.9 C)  Weight: 140 lb (63.5 kg)  Height: 5' (1.524 m)   Body mass index is 27.34 kg/m.  Observation/Objective:   The patient is alert and oriented. She is pleasant and answers all questions appropriately. Breathing is non-labored. She is in no acute distress at this time. She sounds very congested and as though she feels poorly.    Assessment/Plan: 1. Acute upper respiratory infection Start amoxicillin 875mg  twice daily for next 10 days. Advised her to rest and increase fluids. Take OTC tylenol as needed and as indicated for pain and fever.  - amoxicillin (AMOXIL) 875 MG tablet; Take 1 tablet (875 mg total) by mouth 2 (two) times daily. (Patient not taking: Reported on 12/03/2019)  Dispense: 20 tablet; Refill: 0  2. Intractable vomiting with nausea, unspecified vomiting type Add xofran 4mg  ODT up to three times daily as needed for nausea/vomiting. The 'BRAT' diet is suggested, then progress to diet as tolerated as symptoms abate. Call if bloody stools, persistent diarrhea, vomiting, fever or abdominal pain. - ondansetron  (ZOFRAN-ODT) 4 MG disintegrating tablet; Take 1 tablet (4 mg total) by mouth every 8 (eight) hours as needed for nausea or vomiting.  Dispense: 30 tablet; Refill: 0  3. Chest pain, unspecified type Reviewed visit notes from ER, including, testing ECG and chest x-ray. She is to follow up with cardiology as scheduled.   General Counseling: makiko tsutsui understanding of the findings of today's phone visit and agrees with plan of treatment. I have  discussed any further diagnostic evaluation that may be needed or ordered today. We also reviewed her medications today. she has been encouraged to call the office with any questions or concerns that should arise related to todays visit.   This patient was seen by [place] with Dr Lavera Guise as a part of collaborative care agreement  Meds ordered this encounter  Medications  . amoxicillin (AMOXIL) 875 MG tablet    Sig: Take 1 tablet (875 mg total) by mouth 2 (two) times daily.    Dispense:  20 tablet    Refill:  0    Order Specific Question:   Supervising Provider    Answer:   Lavera Guise [id number]  . ondansetron (ZOFRAN-ODT) 4 MG disintegrating tablet    Sig: Take 1 tablet (4 mg total) by mouth every 8 (eight) hours as needed for nausea or vomiting.    Dispense:  30 tablet    Refill:  0    Patient throwing up and unable to tolerate anything oral at this time.    Order Specific Question:   Supervising Provider    Answer:   Lavera Guise [id number]    Time spent: 22 Minutes    Dr Lavera Guise Internal medicine

## 2019-12-03 ENCOUNTER — Other Ambulatory Visit: Payer: Self-pay | Admitting: Cardiology

## 2019-12-03 DIAGNOSIS — I2510 Atherosclerotic heart disease of native coronary artery without angina pectoris: Secondary | ICD-10-CM | POA: Diagnosis not present

## 2019-12-03 DIAGNOSIS — I471 Supraventricular tachycardia: Secondary | ICD-10-CM | POA: Diagnosis not present

## 2019-12-03 DIAGNOSIS — I200 Unstable angina: Secondary | ICD-10-CM

## 2019-12-03 DIAGNOSIS — I10 Essential (primary) hypertension: Secondary | ICD-10-CM | POA: Diagnosis not present

## 2019-12-03 DIAGNOSIS — I739 Peripheral vascular disease, unspecified: Secondary | ICD-10-CM | POA: Diagnosis not present

## 2019-12-03 MED ORDER — SODIUM CHLORIDE 0.9% FLUSH: 3.0000 mL | Freq: Two times a day (BID) | INTRAVENOUS | Status: DC

## 2019-12-04 ENCOUNTER — Other Ambulatory Visit
Admission: RE | Admit: 2019-12-04 | Discharge: 2019-12-04 | Disposition: A | Discharge status: Home / Self Care | Payer: Medicare Other | Source: Ambulatory Visit | Attending: Cardiology | Admitting: Cardiology

## 2019-12-04 ENCOUNTER — Other Ambulatory Visit: Payer: Self-pay

## 2019-12-04 DIAGNOSIS — Z20822 Contact with and (suspected) exposure to covid-19: Secondary | ICD-10-CM | POA: Insufficient documentation

## 2019-12-04 DIAGNOSIS — Z01812 Encounter for preprocedural laboratory examination: Secondary | ICD-10-CM | POA: Diagnosis not present

## 2019-12-05 LAB — SARS CORONAVIRUS 2 (TAT 6-24 HRS): SARS Coronavirus 2: NEGATIVE

## 2019-12-06 DIAGNOSIS — R079 Chest pain, unspecified: Secondary | ICD-10-CM

## 2019-12-08 DIAGNOSIS — J069 Acute upper respiratory infection, unspecified: Secondary | ICD-10-CM | POA: Insufficient documentation

## 2019-12-08 DIAGNOSIS — R1110 Vomiting, unspecified: Secondary | ICD-10-CM | POA: Insufficient documentation

## 2019-12-10 DIAGNOSIS — I2510 Atherosclerotic heart disease of native coronary artery without angina pectoris: Secondary | ICD-10-CM | POA: Diagnosis not present

## 2019-12-11 ENCOUNTER — Ambulatory Visit: Admission: RE | Admit: 2019-12-11 | Payer: Medicare Other | Source: Home / Self Care | Admitting: Cardiology

## 2019-12-11 ENCOUNTER — Encounter: Admission: RE | Payer: Self-pay | Source: Home / Self Care

## 2019-12-11 DIAGNOSIS — R079 Chest pain, unspecified: Secondary | ICD-10-CM

## 2019-12-11 SURGERY — LEFT HEART CATH AND CORONARY ANGIOGRAPHY
Anesthesia: Moderate Sedation | Laterality: Left

## 2019-12-17 ENCOUNTER — Other Ambulatory Visit: Payer: Self-pay | Admitting: Cardiology

## 2019-12-17 DIAGNOSIS — I10 Essential (primary) hypertension: Secondary | ICD-10-CM | POA: Diagnosis not present

## 2019-12-17 DIAGNOSIS — I208 Other forms of angina pectoris: Secondary | ICD-10-CM | POA: Diagnosis not present

## 2019-12-17 DIAGNOSIS — I471 Supraventricular tachycardia: Secondary | ICD-10-CM | POA: Diagnosis not present

## 2019-12-17 DIAGNOSIS — Z01818 Encounter for other preprocedural examination: Secondary | ICD-10-CM

## 2019-12-17 DIAGNOSIS — E782 Mixed hyperlipidemia: Secondary | ICD-10-CM | POA: Diagnosis not present

## 2019-12-17 DIAGNOSIS — I2510 Atherosclerotic heart disease of native coronary artery without angina pectoris: Secondary | ICD-10-CM | POA: Diagnosis not present

## 2019-12-21 ENCOUNTER — Other Ambulatory Visit
Admission: RE | Admit: 2019-12-21 | Discharge: 2019-12-21 | Disposition: A | Discharge status: Home / Self Care | Payer: Medicare Other | Source: Ambulatory Visit | Attending: Cardiology | Admitting: Cardiology

## 2019-12-21 DIAGNOSIS — Z01812 Encounter for preprocedural laboratory examination: Secondary | ICD-10-CM | POA: Diagnosis not present

## 2019-12-21 DIAGNOSIS — Z20822 Contact with and (suspected) exposure to covid-19: Secondary | ICD-10-CM | POA: Insufficient documentation

## 2019-12-21 LAB — SARS CORONAVIRUS 2 (TAT 6-24 HRS): SARS Coronavirus 2: NEGATIVE

## 2019-12-25 ENCOUNTER — Encounter
Admission: RE | Disposition: A | Discharge status: Home / Self Care | Payer: Self-pay | Source: Home / Self Care | Attending: Cardiology

## 2019-12-25 ENCOUNTER — Encounter: Payer: Self-pay | Admitting: Cardiology

## 2019-12-25 ENCOUNTER — Other Ambulatory Visit: Payer: Self-pay

## 2019-12-25 ENCOUNTER — Ambulatory Visit
Admission: RE | Admit: 2019-12-25 | Discharge: 2019-12-25 | Disposition: A | Discharge status: Home / Self Care | Payer: Medicare Other | Attending: Cardiology | Admitting: Cardiology

## 2019-12-25 DIAGNOSIS — G622 Polyneuropathy due to other toxic agents: Secondary | ICD-10-CM | POA: Diagnosis not present

## 2019-12-25 DIAGNOSIS — I10 Essential (primary) hypertension: Secondary | ICD-10-CM | POA: Diagnosis not present

## 2019-12-25 DIAGNOSIS — I252 Old myocardial infarction: Secondary | ICD-10-CM | POA: Insufficient documentation

## 2019-12-25 DIAGNOSIS — E079 Disorder of thyroid, unspecified: Secondary | ICD-10-CM | POA: Diagnosis not present

## 2019-12-25 DIAGNOSIS — F17210 Nicotine dependence, cigarettes, uncomplicated: Secondary | ICD-10-CM | POA: Insufficient documentation

## 2019-12-25 DIAGNOSIS — Z882 Allergy status to sulfonamides status: Secondary | ICD-10-CM | POA: Insufficient documentation

## 2019-12-25 DIAGNOSIS — Z885 Allergy status to narcotic agent status: Secondary | ICD-10-CM | POA: Insufficient documentation

## 2019-12-25 DIAGNOSIS — F329 Major depressive disorder, single episode, unspecified: Secondary | ICD-10-CM | POA: Insufficient documentation

## 2019-12-25 DIAGNOSIS — Z955 Presence of coronary angioplasty implant and graft: Secondary | ICD-10-CM | POA: Diagnosis not present

## 2019-12-25 DIAGNOSIS — Z886 Allergy status to analgesic agent status: Secondary | ICD-10-CM | POA: Insufficient documentation

## 2019-12-25 DIAGNOSIS — Z881 Allergy status to other antibiotic agents status: Secondary | ICD-10-CM | POA: Diagnosis not present

## 2019-12-25 DIAGNOSIS — Z79899 Other long term (current) drug therapy: Secondary | ICD-10-CM | POA: Diagnosis not present

## 2019-12-25 DIAGNOSIS — Z8673 Personal history of transient ischemic attack (TIA), and cerebral infarction without residual deficits: Secondary | ICD-10-CM | POA: Diagnosis not present

## 2019-12-25 DIAGNOSIS — I25118 Atherosclerotic heart disease of native coronary artery with other forms of angina pectoris: Secondary | ICD-10-CM | POA: Insufficient documentation

## 2019-12-25 DIAGNOSIS — R251 Tremor, unspecified: Secondary | ICD-10-CM | POA: Insufficient documentation

## 2019-12-25 DIAGNOSIS — E782 Mixed hyperlipidemia: Secondary | ICD-10-CM | POA: Insufficient documentation

## 2019-12-25 DIAGNOSIS — Z7902 Long term (current) use of antithrombotics/antiplatelets: Secondary | ICD-10-CM | POA: Diagnosis not present

## 2019-12-25 DIAGNOSIS — I2510 Atherosclerotic heart disease of native coronary artery without angina pectoris: Secondary | ICD-10-CM | POA: Diagnosis not present

## 2019-12-25 DIAGNOSIS — Z79890 Hormone replacement therapy (postmenopausal): Secondary | ICD-10-CM | POA: Diagnosis not present

## 2019-12-25 DIAGNOSIS — I471 Supraventricular tachycardia: Secondary | ICD-10-CM | POA: Insufficient documentation

## 2019-12-25 DIAGNOSIS — M1990 Unspecified osteoarthritis, unspecified site: Secondary | ICD-10-CM | POA: Diagnosis not present

## 2019-12-25 DIAGNOSIS — R079 Chest pain, unspecified: Secondary | ICD-10-CM

## 2019-12-25 HISTORY — PX: LEFT HEART CATH AND CORONARY ANGIOGRAPHY: CATH118249

## 2019-12-25 SURGERY — LEFT HEART CATH AND CORONARY ANGIOGRAPHY
Anesthesia: Moderate Sedation | Laterality: Left

## 2019-12-25 MED ORDER — SODIUM CHLORIDE 0.9 % WEIGHT BASED INFUSION: 1.0000 mL/kg/h | INTRAVENOUS | Status: DC

## 2019-12-25 MED ORDER — SODIUM CHLORIDE 0.9 % IV SOLN: 250.0000 mL | INTRAVENOUS | Status: DC | PRN

## 2019-12-25 MED ORDER — MIDAZOLAM HCL 2 MG/2ML IJ SOLN
INTRAMUSCULAR | Status: AC
  Filled 2019-12-25: qty 2

## 2019-12-25 MED ORDER — HEPARIN (PORCINE) IN NACL 1000-0.9 UT/500ML-% IV SOLN
INTRAVENOUS | Status: AC
  Filled 2019-12-25: qty 1000

## 2019-12-25 MED ORDER — MIDAZOLAM HCL 2 MG/2ML IJ SOLN
INTRAMUSCULAR | Status: DC | PRN
  Administered 2019-12-25: 1 mg via INTRAVENOUS
  Administered 2019-12-25: 0.5 mg via INTRAVENOUS

## 2019-12-25 MED ORDER — ONDANSETRON HCL 4 MG/2ML IJ SOLN: 4.0000 mg | Freq: Four times a day (QID) | INTRAMUSCULAR | Status: DC | PRN

## 2019-12-25 MED ORDER — ASPIRIN 81 MG PO CHEW: 81.0000 mg | CHEWABLE_TABLET | ORAL | Status: DC

## 2019-12-25 MED ORDER — SODIUM CHLORIDE 0.9% FLUSH: 3.0000 mL | INTRAVENOUS | Status: DC | PRN

## 2019-12-25 MED ORDER — IOHEXOL 300 MG/ML  SOLN
INTRAMUSCULAR | Status: DC | PRN
  Administered 2019-12-25: 55 mL

## 2019-12-25 MED ORDER — ACETAMINOPHEN 325 MG PO TABS: 650.0000 mg | ORAL_TABLET | ORAL | Status: DC | PRN

## 2019-12-25 MED ORDER — LABETALOL HCL 5 MG/ML IV SOLN: 10.0000 mg | INTRAVENOUS | Status: DC | PRN

## 2019-12-25 MED ORDER — FENTANYL CITRATE (PF) 100 MCG/2ML IJ SOLN
INTRAMUSCULAR | Status: DC | PRN
  Administered 2019-12-25 (×2): 25 ug via INTRAVENOUS

## 2019-12-25 MED ORDER — SODIUM CHLORIDE 0.9 % WEIGHT BASED INFUSION
3.0000 mL/kg/h | INTRAVENOUS | Status: AC
  Administered 2019-12-25: 3 mL/kg/h via INTRAVENOUS

## 2019-12-25 MED ORDER — FENTANYL CITRATE (PF) 100 MCG/2ML IJ SOLN
INTRAMUSCULAR | Status: AC
  Filled 2019-12-25: qty 2

## 2019-12-25 MED ORDER — HEPARIN (PORCINE) IN NACL 1000-0.9 UT/500ML-% IV SOLN
INTRAVENOUS | Status: DC | PRN
  Administered 2019-12-25: 500 mL

## 2019-12-25 MED ORDER — HYDRALAZINE HCL 20 MG/ML IJ SOLN: 10.0000 mg | INTRAMUSCULAR | Status: DC | PRN

## 2019-12-25 MED ORDER — SODIUM CHLORIDE 0.9% FLUSH: 3.0000 mL | Freq: Two times a day (BID) | INTRAVENOUS | Status: DC

## 2019-12-25 SURGICAL SUPPLY — 9 items
CATH INFINITI 5FR ANG PIGTAIL (CATHETERS) ×2 IMPLANT
CATH INFINITI 5FR JL4 (CATHETERS) ×2 IMPLANT
CATH INFINITI JR4 5F (CATHETERS) ×2 IMPLANT
DEVICE CLOSURE MYNXGRIP 5F (Vascular Products) ×2 IMPLANT
KIT MANI 3VAL PERCEP (MISCELLANEOUS) ×2 IMPLANT
NEEDLE PERC 18GX7CM (NEEDLE) ×2 IMPLANT
PACK CARDIAC CATH (CUSTOM PROCEDURE TRAY) ×2 IMPLANT
SHEATH AVANTI 5FR X 11CM (SHEATH) ×2 IMPLANT
WIRE GUIDERIGHT .035X150 (WIRE) ×2 IMPLANT

## 2019-12-25 NOTE — H&P (Signed)
Chief Complaint: Chief Complaint  Patient presents with  . Follow-up  nuclear medicine  Date of Service: 12/17/2019 Date of Birth: Nov 25, 1955 PCP: Lavera Guise, MD  History of Present Illness: Carol [name] is a 64 y.o.female patient with a past medical history significant for coronary artery disease s/p PCI to the RCA, hypertension, paroxsymal SVT, hyperlipidemia, and tobacco abuse who presents to review stress test results. She admits to a several week history of centralized chest pain, radiating between her shoulder blades, with associated shortness of breath. Symptoms occur with minimal exertion and occasionally persist with rest. She has been using nitroglycerin a few times a week on average for her symptoms. She also admits to generalized fatigue and overall "not feeling well." She admits to occasional lower extremity swelling, but denies orthopnea or PND. She denies syncopal/presyncopal episodes. She continues to smoke, but has reduced to 1/2 pack per day.   Stress test revealed exercise induced chest pain with no obvious evidence of reversible ischemia. Echocardiogram in 2019 revealed normal LV function with mild MR and mild TR with mild LVH.   Past Medical and Surgical History  Past Medical History Past Medical History:  Diagnosis Date  . Anemia  . Arthritis  . CAD (coronary artery disease)  occluded RCA treated medically  . Depression  . Hyperlipidemia  . Hypertension  . Migraine headache  . Myocardial infarction (CMS-HCC)  . 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 Stroke (CMS-HCC)  . Thyroid disease  . Tremor   Past Surgical History She has no past surgical history on file.   Medications and Allergies  Current Medications  Current Outpatient Medications  Medication Sig Dispense Refill  . albuterol 2.5 mg /3 mL (0.083 %) Nebu 3 mL, albuterol 5 mg/mL Nebu 0.5 mL aerosol nebs Inhale 5 mg into the lungs as needed.  Marland Kitchen atorvastatin (LIPITOR) 10 MG  tablet TAKE 1 TABLET BY MOUTH ONCE DAILY 90 tablet 3  . calcium carbonate-vitamin D3 (OS-CAL 500+D) 500 mg(1,250mg ) -400 unit tablet Take by mouth  . cetirizine (ZYRTEC) 10 MG tablet Take by mouth.  . citalopram (CELEXA) 40 MG tablet  . clopidogreL (PLAVIX) 75 mg tablet TAKE 1 TABLET BY MOUTH ONCE DAILY 90 tablet 3  . diltiazem (CARDIZEM CD) 120 MG XR capsule Take 1 capsule (120 mg total) by mouth once daily 30 capsule 11  . diltiazem (CARDIZEM) 30 MG tablet TAKE 1 TABLET BY MOUTH 4 TIMES DAILY 360 tablet 3  . isosorbide mononitrate (IMDUR) 30 MG ER tablet Take 1 tablet (30 mg total) by mouth once daily. 90 tablet 2  . isosorbide mononitrate (IMDUR) 30 MG ER tablet TAKE 1 TABLET BY MOUTH ONCE DAILY 90 tablet 3  . levothyroxine (SYNTHROID, LEVOTHROID) 100 MCG tablet Take 100 mcg by mouth once daily. Take on an empty stomach with a glass of water at least 30-60 minutes before breakfast.  . lisinopriL (ZESTRIL) 10 MG tablet TAKE 1 TABLET BY MOUTH ONCE DAILY 90 tablet 3  . lubiprostone (AMITIZA) 8 MCG capsule Take by mouth as needed.   . metoprolol tartrate (LOPRESSOR) 25 MG tablet TAKE 1 TABLET BY MOUTH TWO TIMES DAILY 180 tablet 3  . nitroGLYcerin (NITROSTAT) 0.4 MG SL tablet Place 1 tablet (0.4 mg total) under the tongue every 5 (five) minutes as needed for Chest pain May take up to 3 doses. 25 tablet 1  . pantoprazole (PROTONIX) 40 MG DR tablet TK 1 T PO D 0   No current facility-administered medications  for this visit.   Allergies: Sulfa (sulfonamide antibiotics), Aspir-low [aspirin], Azithromycin, Lyrica [pregabalin], Morphine, Septra [sulfamethoxazole-trimethoprim], and Vioxx [rofecoxib]  Social and Family History  Social History reports that she has been smoking cigarettes. She has been smoking about 1.00 pack per day. She has never used smokeless tobacco. She reports that she does not drink alcohol and does not use drugs.  Family History History reviewed. No pertinent family  history.  Review of Systems   Review of Systems: The patient denies chest pain, shortness of breath, orthopnea, paroxysmal nocturnal dyspnea, pedal edema, palpitations, heart racing, fatigue, dizziness, lightheadedness, presyncope, syncope, leg pain, leg cramping. Review of 12 Systems is negative except as described in HPI.   Physical Examination   Vitals:BP 122/70  Pulse 68  Resp 16  Ht 152.4 cm (5')  Wt 64.4 kg (142 lb)  SpO2 98%  BMI 27.73 kg/m  Ht:152.4 cm (5') Wt:64.4 kg (142 lb) [id number] surface area is 1.65 meters squared. Body mass index is 27.73 kg/m.  General: Well developed, well nourished. In no acute distress HEENT: Pupils equally reactive to light and accomodation  Neck: Supple without thyromegaly, or goiter. Carotid pulses 2+. No carotid bruits present.  Pulmonary: Clear to auscultation bilaterally; no wheezes, rales, rhonchi Cardiovascular: Regular rate and rhythm. No gallops, murmurs or rubs Gastrointestinal: Soft nontender, nondistended, with normal bowel sounds Extremities: No cyanosis, clubbing, or edema Peripheral Pulses: 2+ in upper extremities, 2+ in lower extremities  Neurology: Alert and oriented X3 Pysch: Good affect. Responds appropriately  Assessment and Plan   64 y.o. female with  1. Coronary artery disease involving native coronary artery of native heart without angina pectoris  -With history of CAD, ongoing typical chest pain, risk factors including hypertension, hyperlipidemia, and tobacco abuse, will proceed with a left heart catheterization for further evaluation  -Continue Imdur 30mg  daily with sublingual nitroglycerin PRN  -Allergic to aspirin; continue Plavix 75mg  daily  -Continue lisinopril, metoprolol, and atorvastatin  2. SVT (supraventricular tachycardia) (CMS-HCC)  -Appears well controlled on diltiazem 120mg  daily; will continue with this and follow  3. Essential hypertension  -Continue diltiazem 120mg  daily, Imdur 30mg  daily,  lisinopril 10mg  daily, and metoprolol 25mg  BID  4. Mixed hyperlipidemia  -Continue atorvastatin 10mg  daily with an LDL goal less than 70; most recent calculated at 118; will need to increase dose following catheterization  5. Stable angina pectoris (CMS-HCC)  -See above  6. S/P drug eluting coronary stent placement  -See above     Chrissie Noa A. Tamara Kenyon MD  Pt seen and examined. No change from above.

## 2019-12-25 NOTE — Discharge Instructions (Signed)
Femoral Site Care This sheet gives you information about how to care for yourself after your procedure. Your health care provider may also give you more specific instructions. If you have problems or questions, contact your health care provider. What can I expect after the procedure? After the procedure, it is common to have:  Bruising that usually fades within 1-2 weeks.  Tenderness at the site. Follow these instructions at home: Wound care  Follow instructions from your health care provider about how to take care of your insertion site. Make sure you: ? Wash your hands with soap and water before you change your bandage (dressing). If soap and water are not available, use hand sanitizer. ? Change your dressing as told by your health care provider. ? Leave stitches (sutures), skin glue, or adhesive strips in place. These skin closures may need to stay in place for 2 weeks or longer. If adhesive strip edges start to loosen and curl up, you may trim the loose edges. Do not remove adhesive strips completely unless your health care provider tells you to do that.  Do not take baths, swim, or use a hot tub until your health care provider approves.  You may shower 24-48 hours after the procedure or as told by your health care provider. ? Gently wash the site with plain soap and water. ? Pat the area dry with a clean towel. ? Do not rub the site. This may cause bleeding.  Do not apply powder or lotion to the site. Keep the site clean and dry.  Check your femoral site every day for signs of infection. Check for: ? Redness, swelling, or pain. ? Fluid or blood. ? Warmth. ? Pus or a bad smell. Activity  For the first 2-3 days after your procedure, or as long as directed: ? Avoid climbing stairs as much as possible. ? Do not squat.  Do not lift anything that is heavier than 10 lb (4.5 kg), or the limit that you are told, until your health care provider says that it is safe.  Rest as  directed. ? Avoid sitting for a long time without moving. Get up to take short walks every 1-2 hours.  Do not drive for 24 hours if you were given a medicine to help you relax (sedative). General instructions  Take over-the-counter and prescription medicines only as told by your health care provider.  Keep all follow-up visits as told by your health care provider. This is important. Contact a health care provider if you have:  A fever or chills.  You have redness, swelling, or pain around your insertion site. Get help right away if:  The catheter insertion area swells very fast.  You pass out.  You suddenly start to sweat or your skin gets clammy.  The catheter insertion area is bleeding, and the bleeding does not stop when you hold steady pressure on the area.  The area near or just beyond the catheter insertion site becomes pale, cool, tingly, or numb. These symptoms may represent a serious problem that is an emergency. Do not wait to see if the symptoms will go away. Get medical help right away. Call your local emergency services (911 in the [place] Do not drive yourself to the hospital. Summary  After the procedure, it is common to have bruising that usually fades within 1-2 weeks.  Check your femoral site every day for signs of infection.  Do not lift anything that is heavier than 10 lb (4.5 kg), or the   limit that you are told, until your health care provider says that it is safe. This information is not intended to replace advice given to you by your health care provider. Make sure you discuss any questions you have with your health care provider. Document Revised: 08/15/2017 Document Reviewed: 08/15/2017 Elsevier Patient Education  [place].   Coronary Angiogram A coronary angiogram is an X-ray procedure that is used to examine the arteries in the heart. Contrast dye is injected through a long, thin tube (catheter) into these arteries. Then X-rays are taken to  show any blockage in these arteries. You may have this procedure if you:  Are having chest pain, or other symptoms of angina, and you are at risk for heart disease.  Have an abnormal stress test or test of your heart's electrical activity (electrocardiogram, or ECG).  Have chest pain and heart failure.  Are having irregular heart rhythms. A coronary angiogram or heart catheterization can show if you have valve disease or a disease of the aorta. This procedure can also be used to check the overall function of your heart muscle. Let your health care provider know about:  Any allergies you have, including allergies to medicines or contrast dye.  All medicines you are taking, including vitamins, herbs, eye drops, creams, and over-the-counter medicines.  Any problems you or family members have had with anesthetic medicines.  Any blood disorders you have.  Any surgeries you have had.  Any history of kidney problems or kidney failure.  Any medical conditions you have.  Whether you are pregnant or may be pregnant.  Whether you are breastfeeding. What are the risks? Generally, this is a safe procedure. However, problems may occur, including:  Infection.  Allergic reaction to medicines or dyes that are used.  Bleeding from the insertion site or other places.  Damage to nearby structures, such as blood vessels, or damage to kidneys from contrast dye.  Irregular heart rhythms.  Stroke (rare).  Heart attack (rare). What happens before the procedure? Staying hydrated Follow instructions from your health care provider about hydration, which may include:  Up to 2 hours before the procedure - you may continue to drink clear liquids, such as water, clear fruit juice, black coffee, and plain tea.  Eating and drinking restrictions Follow instructions from your health care provider about eating and drinking, which may include:  8 hours before the procedure - stop eating heavy meals  or foods, such as meat, fried foods, or fatty foods.  6 hours before the procedure - stop eating light meals or foods, such as toast or cereal.  6 hours before the procedure - stop drinking milk or drinks that contain milk.  2 hours before the procedure - stop drinking clear liquids. Medicines Ask your health care provider about:  Changing or stopping your regular medicines. This is especially important if you are taking diabetes medicines or blood thinners.  Taking medicines such as aspirin and ibuprofen. These medicines can thin your blood. Do not take these medicines unless your health care provider tells you to take them. Aspirin may be recommended before coronary angiograms even if you do not normally take it.  Taking over-the-counter medicines, vitamins, herbs, and supplements. General instructions  Do not use any products that contain nicotine or tobacco for at least 4 weeks before the procedure. These products include cigarettes, e-cigarettes, and chewing tobacco. If you need help quitting, ask your health care provider.  You may have an exam or testing.  Plan  to have someone take you home from the hospital or clinic.  If you will be going home right after the procedure, plan to have someone with you for 24 hours.  Ask your health care provider: ? How your insertion site will be marked. ? What steps will be taken to help prevent infection. These may include:  Removing hair at the insertion site.  Washing skin with a germ-killing soap.  Taking antibiotic medicine. What happens during the procedure?   You will lie on your back on an X-ray table.  An IV will be inserted into one of your veins.  Electrodes will be placed on your chest.  You will be given one or more of the following: ? A medicine to help you relax (sedative). ? A medicine to numb the catheter insertion area (local anesthetic).  You will be connected to a continuous ECG monitor.  The catheter will  be inserted into an artery in one of these areas: ? Your groin area in your upper thigh. ? Your wrist. ? The fold of your arm, near your elbow.  An X-ray procedure (fluoroscopy) will be used to help guide the catheter to the opening of the blood vessel to be used.  A dye will be injected into the catheter and X-rays will be taken. The dye will help to show any narrowing or blockages in the heart arteries.  Tell your health care provider if you have chest pain or trouble breathing.  If blockages are found, another procedure may be done to open the artery.  The catheter will be removed after the fluoroscopy is complete.  A bandage (dressing) will be placed over the insertion site. Pressure will be applied to stop bleeding.  The IV will be removed. The procedure may vary among health care providers and hospitals. What happens after the procedure?  Your blood pressure, heart rate, breathing rate, and blood oxygen level will be monitored until you leave the hospital or clinic.  You will need to lie still for a few hours, or for as long as told by your health care provider. ? If the procedure is done through the groin, you will be told not to bend or cross your legs.  The insertion site and the pulse in your foot or wrist will be checked often.  More blood tests, X-rays, and an ECG may be done.  Do not drive for 24 hours if you were given a sedative during your procedure. Summary  A coronary angiogram is an X-ray procedure that is used to examine the arteries in the heart.  Contrast dye is injected through a long, thin tube (catheter) into each artery.  Tell your health care provider about any allergies you have, including allergies to contrast dye.  After the procedure, you will need to lie still for a few hours and drink plenty of fluids. This information is not intended to replace advice given to you by your health care provider. Make sure you discuss any questions you have with  your health care provider. Document Revised: 02/22/2019 Document Reviewed: 02/22/2019 Elsevier Patient Education  [place].   Moderate Conscious Sedation, Adult, Care After These instructions provide you with information about caring for yourself after your procedure. Your health care provider may also give you more specific instructions. Your treatment has been planned according to current medical practices, but problems sometimes occur. Call your health care provider if you have any problems or questions after your procedure. What can I expect after  the procedure? After your procedure, it is common:  To feel sleepy for several hours.  To feel clumsy and have poor balance for several hours.  To have poor judgment for several hours.  To vomit if you eat too soon. Follow these instructions at home: For at least 24 hours after the procedure:   Do not: ? Participate in activities where you could fall or become injured. ? Drive. ? Use heavy machinery. ? Drink alcohol. ? Take sleeping pills or medicines that cause drowsiness. ? Make important decisions or sign legal documents. ? Take care of children on your own.  Rest. Eating and drinking  Follow the diet recommended by your health care provider.  If you vomit: ? Drink water, juice, or soup when you can drink without vomiting. ? Make sure you have little or no nausea before eating solid foods. General instructions  Have a responsible adult stay with you until you are awake and alert.  Take over-the-counter and prescription medicines only as told by your health care provider.  If you smoke, do not smoke without supervision.  Keep all follow-up visits as told by your health care provider. This is important. Contact a health care provider if:  You keep feeling nauseous or you keep vomiting.  You feel light-headed.  You develop a rash.  You have a fever. Get help right away if:  You have trouble  breathing. This information is not intended to replace advice given to you by your health care provider. Make sure you discuss any questions you have with your health care provider. Document Revised: 07/15/2017 Document Reviewed: 11/22/2015 Elsevier Patient Education  2020 [employer].

## 2020-01-01 DIAGNOSIS — E782 Mixed hyperlipidemia: Secondary | ICD-10-CM | POA: Diagnosis not present

## 2020-01-01 DIAGNOSIS — Z955 Presence of coronary angioplasty implant and graft: Secondary | ICD-10-CM | POA: Diagnosis not present

## 2020-01-01 DIAGNOSIS — I10 Essential (primary) hypertension: Secondary | ICD-10-CM | POA: Diagnosis not present

## 2020-01-01 DIAGNOSIS — I2510 Atherosclerotic heart disease of native coronary artery without angina pectoris: Secondary | ICD-10-CM | POA: Diagnosis not present

## 2020-01-01 DIAGNOSIS — I471 Supraventricular tachycardia: Secondary | ICD-10-CM | POA: Diagnosis not present

## 2020-01-23 ENCOUNTER — Telehealth: Payer: Self-pay

## 2020-01-23 NOTE — Telephone Encounter (Signed)
Confirmed appointment on 01/24/2020 and screened for covid. [facility]

## 2020-01-24 ENCOUNTER — Encounter: Payer: Self-pay | Admitting: Nurse Practitioner

## 2020-01-24 ENCOUNTER — Ambulatory Visit (INDEPENDENT_AMBULATORY_CARE_PROVIDER_SITE_OTHER): Payer: Medicare Other | Admitting: Nurse Practitioner

## 2020-01-24 ENCOUNTER — Other Ambulatory Visit: Payer: Self-pay

## 2020-01-24 VITALS — BP 140/53 | HR 60 | Temp 97.2°F | Resp 16 | Ht 60.0 in | Wt 141.2 lb

## 2020-01-24 DIAGNOSIS — J301 Allergic rhinitis due to pollen: Secondary | ICD-10-CM | POA: Diagnosis not present

## 2020-01-24 DIAGNOSIS — J0110 Acute frontal sinusitis, unspecified: Secondary | ICD-10-CM

## 2020-01-24 MED ORDER — AMOXICILLIN 875 MG PO TABS: 875.0000 mg | ORAL_TABLET | Freq: Two times a day (BID) | ORAL | 0 refills | Status: DC

## 2020-01-24 NOTE — Progress Notes (Signed)
[facility] [place], [place] [address]  Internal MEDICINE  Office Visit Note  Patient Name: Carol [name]  [id number]  [id number]  Date of Service: 01/24/2020   Pt is here for a sick visit.  Chief Complaint  Patient presents with  . Ear Problem    Has fluid in both ears, does have tubes in ears     The patient is here for acute visit. She is complaining of sinus congestion and headache. She has pressure and fullness in both ears. Can feel them draining. Drainage is post nasal but also has some drainage from the ear canals. Drainage is clear in color when she can see it. Her eyes are also itchy and puffy. She does have significant allergies. Has been around some jasmine, brought to work by her boss. She has had a little nausea without vomiting. She denies fever. When she coughs, she states it can be productive. She states that all phlegm is clear.        Current Medication:  Outpatient Encounter Medications as of 01/24/2020  Medication Sig  . acetaminophen (TYLENOL) 500 MG tablet Take 1,000 mg by mouth every 6 (six) hours as needed for mild pain or headache.  . albuterol (PROVENTIL HFA;VENTOLIN HFA) 108 (90 BASE) MCG/ACT inhaler Inhale 2 puffs into the lungs every 6 (six) hours as needed for wheezing or shortness of breath.  Marland Kitchen albuterol (PROVENTIL) (2.5 MG/3ML) 0.083% nebulizer solution USE 1 VIAL VIA NEBULIZER  EVERY 4 TO 6 HOURS AS  NEEDED (Patient taking differently: Take 2.5 mg by nebulization every 4 (four) hours as needed for wheezing or shortness of breath. )  . atorvastatin (LIPITOR) 10 MG tablet Take 10 mg by mouth daily. Take 1 tab po daily  . Calcium Carbonate-Vitamin D 600-200 MG-UNIT CAPS Take 1 tablet by mouth daily.   . citalopram (CELEXA) 40 MG tablet Take 1 tablet (40 mg total) by mouth daily.  . clopidogrel (PLAVIX) 75 MG tablet Take 75 mg by mouth daily.   Marland Kitchen diltiazem (CARDIZEM) 30 MG tablet Take 30 mg by mouth daily as needed (Heart  palpitations).   . fluticasone (FLONASE) 50 MCG/ACT nasal spray USE 1 SPRAY IN EACH NOSTRIL TWO TIMES DAILY (Patient taking differently: Place 1 spray into both nostrils daily. )  . isosorbide mononitrate (IMDUR) 30 MG 24 hr tablet Take 30 mg by mouth daily.  Marland Kitchen levothyroxine (SYNTHROID) 100 MCG tablet Take 1 tablet (100 mcg total) by mouth daily.  Marland Kitchen lisinopril (PRINIVIL,ZESTRIL) 10 MG tablet Take 10 mg by mouth daily.  Marland Kitchen loratadine (CLARITIN) 10 MG tablet Take 10 mg by mouth daily.  Marland Kitchen lubiprostone (AMITIZA) 8 MCG capsule Take 8 mcg by mouth daily as needed for constipation.   . metoprolol tartrate (LOPRESSOR) 25 MG tablet Take 12.5 mg by mouth daily.   . nitroGLYCERIN (NITROSTAT) 0.4 MG SL tablet Place 0.4 mg under the tongue every 5 (five) minutes as needed for chest pain.  Marland Kitchen ondansetron (ZOFRAN-ODT) 4 MG disintegrating tablet Take 1 tablet (4 mg total) by mouth every 8 (eight) hours as needed for nausea or vomiting.  . pantoprazole (PROTONIX) 40 MG tablet TAKE 1 TABLET BY MOUTH  DAILY (Patient taking differently: Take 40 mg by mouth daily. )  . SYRINGE/NEEDLE, DISP, 1 ML 23G X 1" 1 ML MISC 1 each by Does not apply route once a week.  Marland Kitchen amoxicillin (AMOXIL) 875 MG tablet Take 1 tablet (875 mg total) by mouth 2 (two) times daily.  Marland Kitchen  diltiazem (CARDIZEM CD) 120 MG 24 hr capsule Take 120 mg by mouth daily.    Facility-Administered Encounter Medications as of 01/24/2020  Medication  . sodium chloride flush (NS) 0.9 % injection 3 mL      Medical History: Past Medical History:  Diagnosis Date  . Bipolar affective disorder ([place])   . Coronary artery disease   . Hyperlipidemia   . Hypertension   . Hypothyroid   . Myocardial infarction ([place])   . Stroke ([place]) 11/15/2016   "right side weaker since" (12/20/2016)     Today's Vitals   01/24/20 0859  BP: (!) 140/53  Pulse: 60  Resp: 16  Temp: (!) 97.2 F (36.2 C)  SpO2: 97%  Weight: 141 lb 3.2 oz (64 kg)  Height: 5' (1.524 m)   Body mass  index is 27.58 kg/m.  Review of Systems  Constitutional: Positive for chills and fatigue. Negative for fever.  HENT: Positive for congestion, ear pain, postnasal drip, rhinorrhea, sinus pressure and sinus pain. Negative for sore throat and voice change.   Respiratory: Positive for cough. Negative for wheezing.   Cardiovascular: Negative for chest pain and palpitations.  Gastrointestinal: Positive for nausea.  Skin: Negative.   Allergic/Immunologic: Positive for environmental allergies.  Neurological: Positive for headaches.  Hematological: Positive for adenopathy.    Physical Exam Vitals and nursing note reviewed.  Constitutional:      General: She is not in acute distress.    Appearance: Normal appearance. She is well-developed. She is not diaphoretic.  HENT:     Head: Normocephalic and atraumatic.     Right Ear: A PE tube is present. Tympanic membrane is not erythematous or bulging.     Left Ear: Tympanic membrane is not erythematous or bulging.     Nose: Congestion present.     Right Sinus: Frontal sinus tenderness present.     Left Sinus: Frontal sinus tenderness present.     Mouth/Throat:     Pharynx: No oropharyngeal exudate.     Comments: Copious post nasal drip evident.  Eyes:     Pupils: Pupils are equal, round, and reactive to light.  Neck:     Thyroid: No thyromegaly.     Vascular: No JVD.     Trachea: No tracheal deviation.  Cardiovascular:     Rate and Rhythm: Normal rate and regular rhythm.     Heart sounds: Normal heart sounds. No murmur heard.  No friction rub. No gallop.   Pulmonary:     Effort: Pulmonary effort is normal. No respiratory distress.     Breath sounds: Normal breath sounds. No wheezing or rales.  Chest:     Chest wall: No tenderness.  Abdominal:     Palpations: Abdomen is soft.  Musculoskeletal:        General: Normal range of motion.     Cervical back: Normal range of motion and neck supple.  Lymphadenopathy:     Cervical: No cervical  adenopathy.  Skin:    General: Skin is warm and dry.  Neurological:     Mental Status: She is alert and oriented to person, place, and time.     Cranial Nerves: No cranial nerve deficit.  Psychiatric:        Mood and Affect: Mood normal.        Behavior: Behavior normal.        Thought Content: Thought content normal.        Judgment: Judgment normal.   Assessment/Plan: 1. Acute non-recurrent frontal  sinusitis Start amoxicillin 875mg  bid for 10 days. Rest and increase fluids. Recommend OTC mucinex twice daily as needed to help congestion. Samples provided today.  - amoxicillin (AMOXIL) 875 MG tablet; Take 1 tablet (875 mg total) by mouth 2 (two) times daily.  Dispense: 20 tablet; Refill: 0  2. Seasonal allergic rhinitis due to pollen Continue zyrtec and flonase daily.   General Counseling: joselinne lawal understanding of the findings of todays visit and agrees with plan of treatment. I have discussed any further diagnostic evaluation that may be needed or ordered today. We also reviewed her medications today. she has been encouraged to call the office with any questions or concerns that should arise related to todays visit.    Counseling:   This patient was seen by [place] with Dr Lavera Guise as a part of collaborative care agreement  Meds ordered this encounter  Medications  . amoxicillin (AMOXIL) 875 MG tablet    Sig: Take 1 tablet (875 mg total) by mouth 2 (two) times daily.    Dispense:  20 tablet    Refill:  0    Order Specific Question:   Supervising Provider    Answer:   Lavera Guise [[id number]]    Time spent: 20 Minutes

## 2020-02-12 DIAGNOSIS — H9211 Otorrhea, right ear: Secondary | ICD-10-CM | POA: Diagnosis not present

## 2020-02-12 DIAGNOSIS — H9201 Otalgia, right ear: Secondary | ICD-10-CM | POA: Diagnosis not present

## 2020-02-22 ENCOUNTER — Other Ambulatory Visit: Payer: Self-pay | Admitting: Adult Health

## 2020-02-28 DIAGNOSIS — H9211 Otorrhea, right ear: Secondary | ICD-10-CM | POA: Diagnosis not present

## 2020-04-25 ENCOUNTER — Other Ambulatory Visit: Payer: Self-pay | Admitting: Internal Medicine

## 2020-05-05 ENCOUNTER — Ambulatory Visit
Admission: EM | Admit: 2020-05-05 | Discharge: 2020-05-05 | Disposition: A | Discharge status: Home / Self Care | Payer: Medicare Other | Attending: Family Medicine | Admitting: Family Medicine

## 2020-05-05 ENCOUNTER — Other Ambulatory Visit: Payer: Self-pay

## 2020-05-05 DIAGNOSIS — Z20822 Contact with and (suspected) exposure to covid-19: Secondary | ICD-10-CM | POA: Diagnosis not present

## 2020-05-05 DIAGNOSIS — B9789 Other viral agents as the cause of diseases classified elsewhere: Secondary | ICD-10-CM

## 2020-05-05 DIAGNOSIS — J988 Other specified respiratory disorders: Secondary | ICD-10-CM | POA: Insufficient documentation

## 2020-05-05 NOTE — Discharge Instructions (Signed)
Tylenol 1000 mg 3-4 times daily as needed for pain/fever.  Stay hydrated.   COVID test will be back tomorrow.   Take care  Dr. Lacinda Axon

## 2020-05-05 NOTE — ED Provider Notes (Signed)
[facility]    CSN: [id number] Arrival date & time: 05/05/20  1630      History   Chief Complaint Chief Complaint  Patient presents with  . Chest Pain  . Sore Throat  . Nasal Congestion  . Chills  . Fever  . Generalized Body Aches   HPI 64 year old female presents with the above complaints.  Patient reports her symptoms started yesterday.  She reports subjective fever, chills, cough, shortness of breath, body aches, chest tightness, altered sense of smell.  She has been around someone who has been diagnosed with COVID-19.  She is vaccinated.  No relieving factors.  Patient states that she feels very poorly.  Pain 6/10 in severity.  No other complaints.   Past Medical History:  Diagnosis Date  . Bipolar affective disorder ([place])   . Coronary artery disease   . Hyperlipidemia   . Hypertension   . Hypothyroid   . Myocardial infarction ([place])   . Stroke [facility]) 11/15/2016   "right side weaker since" (12/20/2016)    Patient Active Problem List   Diagnosis Date Noted  . Acute non-recurrent frontal sinusitis 01/24/2020  . Seasonal allergic rhinitis due to pollen 01/24/2020  . Acute upper respiratory infection 12/08/2019  . Intractable vomiting 12/08/2019  . S/P drug eluting coronary stent placement 04/13/2018  . SVT (supraventricular tachycardia) ([place]) 04/10/2018  . Hyperlipidemia, unspecified 12/01/2017  . Choledocholithiasis with acute cholecystitis 12/19/2016  . Coronary artery disease 12/19/2016  . Essential hypertension 12/19/2016  . Hypothyroidism 12/19/2016  . History of stroke 12/19/2016  . Migraine 06/17/2016  . Cognitive deficit, post-stroke 05/18/2015  . Chest pain 05/12/2015  . Right facial numbness 03/30/2015  . Lithium intoxication 02/27/2015  . Bipolar disorder ([place]) 02/27/2015  . CVA (cerebral infarction) 02/26/2015  . Unstable angina ([place]) 02/03/2015  . Tobacco use 01/19/2013  . Temporomandibular joint disorder 01/02/2013  . Tarsal tunnel  syndrome 07/21/2012  . 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 Plantar fascial fibromatosis 07/21/2012  . 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 ABDOMINAL HYSTERECTOMY    . APPENDECTOMY    . CARDIAC CATHETERIZATION Left 09/26/2015   Procedure: Left Heart Cath and Coronary Angiography;  Surgeon: Teodoro Spray, MD;  Location: [place] [facility];  Service: Cardiovascular;  Laterality: Left;  . CHOLECYSTECTOMY N/A 12/22/2016   Procedure: LAPAROSCOPIC CHOLECYSTECTOMY WITH INTRAOPERATIVE CHOLANGIOGRAM;  Surgeon: Jovita Kussmaul, MD;  Location: [place];  Service: General;  Laterality: N/A;  . CORONARY STENT INTERVENTION N/A 04/13/2018   Procedure: CORONARY STENT INTERVENTION;  Surgeon: Yolonda Kida, MD;  Location: [place] [facility];  Service: Cardiovascular;  Laterality: N/A;  . ERCP N/A 12/20/2016   Procedure: ENDOSCOPIC RETROGRADE CHOLANGIOPANCREATOGRAPHY (ERCP);  Surgeon: Teena Irani, MD;  Location: [facility] ENDOSCOPY;  Service: Endoscopy;  Laterality: N/A;  . HYSTEROTOMY    . LEFT HEART CATH AND CORONARY ANGIOGRAPHY Left 04/13/2018   Procedure: LEFT HEART CATH AND CORONARY ANGIOGRAPHY;  Surgeon: Teodoro Spray, MD;  Location: [place] [facility];  Service: Cardiovascular;  Laterality: Left;  . LEFT HEART CATH AND CORONARY ANGIOGRAPHY Left 12/25/2019   Procedure: LEFT HEART CATH AND CORONARY ANGIOGRAPHY;  Surgeon: Teodoro Spray, MD;  Location: [place] [facility];  Service: Cardiovascular;  Laterality: Left;    OB History   No obstetric history on file.      Home Medications    Prior to Admission medications  Medication Sig Start Date End Date Taking? Authorizing Provider  acetaminophen (TYLENOL) 500 MG tablet Take 1,000 mg by mouth every 6 (six) hours as needed for mild pain or headache.   Yes [provider]  albuterol (PROVENTIL HFA;VENTOLIN HFA) 108 (90 BASE) MCG/ACT inhaler Inhale 2  puffs into the lungs every 6 (six) hours as needed for wheezing or shortness of breath.   Yes [provider]  albuterol (PROVENTIL) (2.5 MG/3ML) 0.083% nebulizer solution USE 1 VIAL VIA NEBULIZER  EVERY 4 TO 6 HOURS AS  NEEDED Patient taking differently: Take 2.5 mg by nebulization every 4 (four) hours as needed for wheezing or shortness of breath.  09/07/17  Yes Lavera Guise, MD  amoxicillin (AMOXIL) 875 MG tablet Take 1 tablet (875 mg total) by mouth 2 (two) times daily. 01/24/20  Yes Boscia, Greer Ee, NP  atorvastatin (LIPITOR) 10 MG tablet Take 10 mg by mouth daily. Take 1 tab po daily 04/11/19  Yes [provider]  Calcium Carbonate-Vitamin D 600-200 MG-UNIT CAPS Take 1 tablet by mouth daily.    Yes [provider]  citalopram (CELEXA) 40 MG tablet TAKE 1 TABLET BY MOUTH  DAILY 04/25/20  Yes Lavera Guise, MD  clopidogrel (PLAVIX) 75 MG tablet Take 75 mg by mouth daily.    Yes [provider]  diltiazem (CARDIZEM) 30 MG tablet Take 30 mg by mouth daily as needed (Heart palpitations).  10/25/19  Yes [provider]  fluticasone (FLONASE) 50 MCG/ACT nasal spray USE 1 SPRAY IN EACH NOSTRIL TWO TIMES DAILY Patient taking differently: Place 1 spray into both nostrils daily.  09/07/17  Yes Lavera Guise, MD  isosorbide mononitrate (IMDUR) 30 MG 24 hr tablet Take 30 mg by mouth daily.   Yes [provider]  levothyroxine (SYNTHROID) 100 MCG tablet Take 1 tablet (100 mcg total) by mouth daily. 06/04/19  Yes Lavera Guise, MD  lisinopril (PRINIVIL,ZESTRIL) 10 MG tablet Take 10 mg by mouth daily.   Yes [provider]  loratadine (CLARITIN) 10 MG tablet Take 10 mg by mouth daily.   Yes [provider]  lubiprostone (AMITIZA) 8 MCG capsule Take 8 mcg by mouth daily as needed for constipation.    Yes [provider]  metoprolol tartrate (LOPRESSOR) 25 MG tablet Take 12.5 mg by mouth daily.    Yes [provider]    nitroGLYCERIN (NITROSTAT) 0.4 MG SL tablet Place 0.4 mg under the tongue every 5 (five) minutes as needed for chest pain.   Yes [provider]  ondansetron (ZOFRAN-ODT) 4 MG disintegrating tablet Take 1 tablet (4 mg total) by mouth every 8 (eight) hours as needed for nausea or vomiting. 11/27/19  Yes Boscia, Heather E, NP  pantoprazole (PROTONIX) 40 MG tablet TAKE 1 TABLET BY MOUTH  DAILY 02/25/20  Yes Boscia, Heather E, NP  SYRINGE/NEEDLE, DISP, 1 ML 23G X 1" 1 ML MISC 1 each by Does not apply route once a week. 07/26/19  Yes [place], Audie Clear, NP  diltiazem (CARDIZEM CD) 120 MG 24 hr capsule Take 120 mg by mouth daily.  01/16/19 01/16/20  [provider]    Family History Family History  Problem Relation Age of Onset  . CAD Other     Social History Social History   Tobacco Use  . Smoking status: Current Every Day Smoker    Packs/day: 0.50    Years: 45.00    Pack years: 22.50    Types: Cigarettes  .  Smokeless tobacco: Never Used  . Tobacco comment: was up to two packs a day  Substance Use Topics  . Alcohol use: Yes    Comment: occasionally   . Drug use: No     Allergies   Morphine, Sulfa antibiotics, Sulfamethoxazole-trimethoprim, Azithromycin, Pregabalin, Asa [aspirin], and Rofecoxib   Review of Systems Review of Systems Per HPI  Physical Exam Triage Vital Signs ED Triage Vitals  Enc Vitals Group     BP 05/05/20 1709 130/61     Pulse Rate 05/05/20 1709 (!) 58     Resp 05/05/20 1709 18     Temp 05/05/20 1709 98.4 F (36.9 C)     Temp Source 05/05/20 1709 Oral     SpO2 05/05/20 1709 99 %     Weight 05/05/20 1712 140 lb (63.5 kg)     Height 05/05/20 1712 5' (1.524 m)     Head Circumference --      Peak Flow --      Pain Score 05/05/20 1712 6     Pain Loc --      Pain Edu? --      Excl. in [place]? --    Updated Vital Signs BP 130/61 (BP Location: Right Arm)   Pulse (!) 58   Temp 98.4 F (36.9 C) (Oral)   Resp 18   Ht 5' (1.524 m)   Wt 63.5 kg    SpO2 99%   BMI 27.34 kg/m   Visual Acuity Right Eye Distance:   Left Eye Distance:   Bilateral Distance:    Right Eye Near:   Left Eye Near:    Bilateral Near:     Physical Exam Vitals and nursing note reviewed.  Constitutional:      General: She is not in acute distress.    Appearance: Normal appearance. She is not ill-appearing.  HENT:     Head: Normocephalic and atraumatic.     Mouth/Throat:     Pharynx: Oropharynx is clear. No oropharyngeal exudate or posterior oropharyngeal erythema.  Eyes:     General:        Right eye: No discharge.        Left eye: No discharge.     Conjunctiva/sclera: Conjunctivae normal.  Cardiovascular:     Rate and Rhythm: Regular rhythm. Bradycardia present.  Pulmonary:     Effort: Pulmonary effort is normal.     Breath sounds: Wheezing present.  Neurological:     Mental Status: She is alert.  Psychiatric:        Mood and Affect: Mood normal.        Behavior: Behavior normal.    UC Treatments / Results  Labs (all labs ordered are listed, but only abnormal results are displayed) Labs Reviewed  SARS CORONAVIRUS 2 (TAT 6-24 HRS)    EKG   Radiology No results found.  Procedures Procedures (including critical care time)  Medications Ordered in UC Medications - No data to display  Initial Impression / Assessment and Plan / UC Course  I have reviewed the triage vital signs and the nursing notes.  Pertinent labs & imaging results that were available during my care of the patient were reviewed by me and considered in my medical decision making (see chart for details).    64 year old female presents with suspected COVID-19.  Awaiting test results.  Tylenol as directed.  Supportive care.  Would benefit from monoclonal body infusion if she test positive.  Final Clinical Impressions(s) / UC Diagnoses   Final  diagnoses:  Viral respiratory infection  Suspected COVID-19 virus infection     Discharge Instructions     Tylenol  1000 mg 3-4 times daily as needed for pain/fever.  Stay hydrated.   COVID test will be back tomorrow.   Take care  Dr. Lacinda Axon    ED Prescriptions    None     PDMP not reviewed this encounter.   Coral Spikes, [place] 05/05/20 1846

## 2020-05-05 NOTE — ED Triage Notes (Signed)
Patient in today w/ c/o fever, chills, cough, some SOB, B/A, chest pain from coughing, and sense of taste is altered. Patient states sx onset was yesterday.   Patient is vaccinated against COVID-19.  Pfizer 2nd dose received 10/26/2019.

## 2020-05-06 LAB — SARS CORONAVIRUS 2 (TAT 6-24 HRS): SARS Coronavirus 2: NEGATIVE

## 2020-05-17 ENCOUNTER — Other Ambulatory Visit: Payer: Self-pay | Admitting: Internal Medicine

## 2020-06-02 ENCOUNTER — Other Ambulatory Visit: Payer: Self-pay

## 2020-06-02 ENCOUNTER — Encounter: Payer: Self-pay | Admitting: Adult Health

## 2020-06-02 ENCOUNTER — Ambulatory Visit (INDEPENDENT_AMBULATORY_CARE_PROVIDER_SITE_OTHER): Payer: Medicare Other | Admitting: Adult Health

## 2020-06-02 VITALS — BP 124/56 | HR 67 | Temp 97.2°F | Resp 16 | Ht 60.0 in | Wt 141.2 lb

## 2020-06-02 DIAGNOSIS — R300 Dysuria: Secondary | ICD-10-CM | POA: Diagnosis not present

## 2020-06-02 DIAGNOSIS — Z13820 Encounter for screening for osteoporosis: Secondary | ICD-10-CM

## 2020-06-02 DIAGNOSIS — Z0001 Encounter for general adult medical examination with abnormal findings: Secondary | ICD-10-CM

## 2020-06-02 DIAGNOSIS — E782 Mixed hyperlipidemia: Secondary | ICD-10-CM

## 2020-06-02 DIAGNOSIS — R5383 Other fatigue: Secondary | ICD-10-CM

## 2020-06-02 DIAGNOSIS — Z23 Encounter for immunization: Secondary | ICD-10-CM

## 2020-06-02 DIAGNOSIS — M169 Osteoarthritis of hip, unspecified: Secondary | ICD-10-CM

## 2020-06-02 MED ORDER — TRAMADOL HCL 50 MG PO TABS: 50.0000 mg | ORAL_TABLET | Freq: Three times a day (TID) | ORAL | 0 refills | Status: AC | PRN

## 2020-06-02 NOTE — Progress Notes (Signed)
[facility] [address], [place] [address]  Internal MEDICINE  Office Visit Note  Patient Name: Carol [name]  [id number]  [id number]  Date of Service: 06/03/2020  Chief Complaint  Patient presents with  . Medicare Wellness    b12 possibly dropped, tired, no energy/drained, anxiety up, refill request, discuss bp and blood sugar, corn on left foot  . Hypertension  . Hyperlipidemia  . policy update form    Received  . Quality Metric Gaps    dexa, PNA, mammogram     HPI Pt is here for routine health maintenance examination.  Pt is well appearing 64 yo female.  She has a history of HTN, depression, HLD.  She also has a history of B12 deficency.  She reports she has been having increased fatigue, no energy, feelign drained and achy.  Her blood pressure is not elevated today.  Her diastolic is in the 24'Q.  She also reports episodes of hypoglycemia.  She is generally able to control with eating. She does report some arthritis and bursitits flares through the year.  She generally uses tramadol for this.  She was given 25 tabs of tramadol one year ago, and she has recently taken the last one.    Current Medication: Outpatient Encounter Medications as of 06/02/2020  Medication Sig  . acetaminophen (TYLENOL) 500 MG tablet Take 1,000 mg by mouth every 6 (six) hours as needed for mild pain or headache.  . albuterol (PROVENTIL HFA;VENTOLIN HFA) 108 (90 BASE) MCG/ACT inhaler Inhale 2 puffs into the lungs every 6 (six) hours as needed for wheezing or shortness of breath.  Marland Kitchen albuterol (PROVENTIL) (2.5 MG/3ML) 0.083% nebulizer solution USE 1 VIAL VIA NEBULIZER  EVERY 4 TO 6 HOURS AS  NEEDED (Patient taking differently: Take 2.5 mg by nebulization every 4 (four) hours as needed for wheezing or shortness of breath. )  . atorvastatin (LIPITOR) 10 MG tablet Take 10 mg by mouth daily. Take 1 tab po daily  . Calcium Carbonate-Vitamin D 600-200 MG-UNIT CAPS Take 1 tablet by mouth  daily.   . citalopram (CELEXA) 40 MG tablet TAKE 1 TABLET BY MOUTH  DAILY  . clopidogrel (PLAVIX) 75 MG tablet Take 75 mg by mouth daily.   Marland Kitchen diltiazem (CARDIZEM) 30 MG tablet Take 30 mg by mouth daily as needed (Heart palpitations).   . fluticasone (FLONASE) 50 MCG/ACT nasal spray USE 1 SPRAY IN EACH NOSTRIL TWO TIMES DAILY (Patient taking differently: Place 1 spray into both nostrils daily. )  . isosorbide mononitrate (IMDUR) 30 MG 24 hr tablet Take 30 mg by mouth daily.  Marland Kitchen levothyroxine (SYNTHROID) 100 MCG tablet TAKE 1 TABLET BY MOUTH  DAILY  . lisinopril (PRINIVIL,ZESTRIL) 10 MG tablet Take 10 mg by mouth daily.  Marland Kitchen loratadine (CLARITIN) 10 MG tablet Take 10 mg by mouth daily.  Marland Kitchen lubiprostone (AMITIZA) 8 MCG capsule Take 8 mcg by mouth daily as needed for constipation.   . metoprolol tartrate (LOPRESSOR) 25 MG tablet Take 12.5 mg by mouth daily.   . nitroGLYCERIN (NITROSTAT) 0.4 MG SL tablet Place 0.4 mg under the tongue every 5 (five) minutes as needed for chest pain.  Marland Kitchen ondansetron (ZOFRAN-ODT) 4 MG disintegrating tablet Take 1 tablet (4 mg total) by mouth every 8 (eight) hours as needed for nausea or vomiting.  . pantoprazole (PROTONIX) 40 MG tablet TAKE 1 TABLET BY MOUTH  DAILY  . SYRINGE/NEEDLE, DISP, 1 ML 23G X 1" 1 ML MISC 1 each by Does not  apply route once a week.  . diltiazem (CARDIZEM CD) 120 MG 24 hr capsule Take 120 mg by mouth daily.   . traMADol (ULTRAM) 50 MG tablet Take 1 tablet (50 mg total) by mouth every 8 (eight) hours as needed for up to 5 days.  . [DISCONTINUED] amoxicillin (AMOXIL) 875 MG tablet Take 1 tablet (875 mg total) by mouth 2 (two) times daily. (Patient not taking: Reported on 06/02/2020)   Facility-Administered Encounter Medications as of 06/02/2020  Medication  . sodium chloride flush (NS) 0.9 % injection 3 mL    Surgical History: Past Surgical History:  Procedure Laterality Date  . ABDOMINAL HYSTERECTOMY    . APPENDECTOMY    . CARDIAC  CATHETERIZATION Left 09/26/2015   Procedure: Left Heart Cath and Coronary Angiography;  Surgeon: Teodoro Spray, MD;  Location: [place] [facility];  Service: Cardiovascular;  Laterality: Left;  . CHOLECYSTECTOMY N/A 12/22/2016   Procedure: LAPAROSCOPIC CHOLECYSTECTOMY WITH INTRAOPERATIVE CHOLANGIOGRAM;  Surgeon: Jovita Kussmaul, MD;  Location: [place];  Service: General;  Laterality: N/A;  . CORONARY STENT INTERVENTION N/A 04/13/2018   Procedure: CORONARY STENT INTERVENTION;  Surgeon: Yolonda Kida, MD;  Location: [place] [facility];  Service: Cardiovascular;  Laterality: N/A;  . ERCP N/A 12/20/2016   Procedure: ENDOSCOPIC RETROGRADE CHOLANGIOPANCREATOGRAPHY (ERCP);  Surgeon: Teena Irani, MD;  Location: [facility] ENDOSCOPY;  Service: Endoscopy;  Laterality: N/A;  . HYSTEROTOMY    . LEFT HEART CATH AND CORONARY ANGIOGRAPHY Left 04/13/2018   Procedure: LEFT HEART CATH AND CORONARY ANGIOGRAPHY;  Surgeon: Teodoro Spray, MD;  Location: [place] [facility];  Service: Cardiovascular;  Laterality: Left;  . LEFT HEART CATH AND CORONARY ANGIOGRAPHY Left 12/25/2019   Procedure: LEFT HEART CATH AND CORONARY ANGIOGRAPHY;  Surgeon: Teodoro Spray, MD;  Location: [place] [facility];  Service: Cardiovascular;  Laterality: Left;    Medical History: Past Medical History:  Diagnosis Date  . Bipolar affective disorder ([place])   . Coronary artery disease   . Hyperlipidemia   . Hypertension   . Hypothyroid   . Myocardial infarction ([place])   . Stroke [facility]) 11/15/2016   "right side weaker since" (12/20/2016)    Family History: Family History  Problem Relation Age of Onset  . CAD Other       Review of Systems  Constitutional: Negative for chills, fatigue and unexpected weight change.  HENT: Negative for congestion, rhinorrhea, sneezing and sore throat.   Eyes: Negative for photophobia, pain and redness.  Respiratory: Negative for cough, chest tightness and shortness of breath.   Cardiovascular: Negative for  chest pain and palpitations.  Gastrointestinal: Negative for abdominal pain, constipation, diarrhea, nausea and vomiting.  Endocrine: Negative.   Genitourinary: Negative for dysuria and frequency.  Musculoskeletal: Negative for arthralgias, back pain, joint swelling and neck pain.  Skin: Negative for rash.  Allergic/Immunologic: Negative.   Neurological: Negative for tremors and numbness.  Hematological: Negative for adenopathy. Does not bruise/bleed easily.  Psychiatric/Behavioral: Negative for behavioral problems and sleep disturbance. The patient is not nervous/anxious.      Vital Signs: BP (!) 124/56   Pulse 67   Temp (!) 97.2 F (36.2 C)   Resp 16   Ht 5' (1.524 m)   Wt 141 lb 3.2 oz (64 kg)   SpO2 98%   BMI 27.58 kg/m    Physical Exam   LABS: Recent Results (from the past 2160 hour(s))  SARS CORONAVIRUS 2 (TAT 6-24 HRS) Nasopharyngeal Nasopharyngeal Swab  Status: None   Collection Time: 05/05/20  5:15 PM   Specimen: Nasopharyngeal Swab  Result Value Ref Range   SARS Coronavirus 2 NEGATIVE NEGATIVE    Comment: (NOTE) SARS-CoV-2 target nucleic acids are NOT DETECTED.  The SARS-CoV-2 RNA is generally detectable in upper and lower respiratory specimens during the acute phase of infection. Negative results do not preclude SARS-CoV-2 infection, do not rule out co-infections with other pathogens, and should not be used as the sole basis for treatment or other patient management decisions. Negative results must be combined with clinical observations, patient history, and epidemiological information. The expected result is Negative.  Fact Sheet for Patients: SugarRoll.be  Fact Sheet for Healthcare Providers: https://www.woods-mathews.com/  This test is not yet approved or cleared by the [place] FDA and  has been authorized for detection and/or diagnosis of SARS-CoV-2 by FDA under an Emergency Use Authorization  (EUA). This EUA will remain  in effect (meaning this test can be used) for the duration of the COVID-19 declaration under Se ction 564(b)(1) of the Act, 21 U.S.C. section 360bbb-3(b)(1), unless the authorization is terminated or revoked sooner.  Performed at [facility], [place] [address] [place], [place] [address]   UA/M w/rflx Culture, Routine     Status: None   Collection Time: 06/02/20  2:52 PM   Specimen: Urine   Urine  Result Value Ref Range   Specific Gravity, UA 1.015 1.005 - 1.030   pH, UA 6.5 5.0 - 7.5   Color, UA Yellow Yellow   Appearance Ur Clear Clear   Leukocytes,UA Negative Negative   Protein,UA Negative Negative/Trace   Glucose, UA Negative Negative   Ketones, UA Negative Negative   RBC, UA Negative Negative   Bilirubin, UA Negative Negative   Urobilinogen, Ur 0.2 0.2 - 1.0 mg/dL   Nitrite, UA Negative Negative   Microscopic Examination Comment     Comment: Microscopic follows if indicated.   Microscopic Examination See below:     Comment: Microscopic was indicated and was performed.   Urinalysis Reflex Comment     Comment: This specimen will not reflex to a Urine Culture.  Microscopic Examination     Status: None   Collection Time: 06/02/20  2:52 PM   Urine  Result Value Ref Range   WBC, UA 0-5 0 - 5 /hpf   RBC 0-2 0 - 2 /hpf   Epithelial Cells (non renal) 0-10 0 - 10 /hpf   Casts None seen None seen /lpf   Bacteria, UA None seen None seen/Few     Assessment/Plan: 1. Encounter for general adult medical examination with abnormal findings Up to date on PHM.  - CBC with Differential/Platelet - Lipid Panel With LDL/HDL Ratio - TSH - T4, free - Comprehensive metabolic panel  2. Osteoarthritis of hip, unspecified laterality, unspecified osteoarthritis type Reviewed risks and possible side effects associated with taking opiates, benzodiazepines and other CNS depressants. Combination of these could cause dizziness and drowsiness. Advised patient  not to drive or operate machinery when taking these medications, as patient's and other's life can be at risk and will have consequences. Patient verbalized understanding in this matter. Dependence and abuse for these drugs will be monitored closely. A Controlled substance policy and procedure is on file which allows [facility] to order a urine drug screen test at any visit. Patient understands and agrees with the plan - traMADol (ULTRAM) 50 MG tablet; Take 1 tablet (50 mg total) by mouth every 8 (eight) hours  as needed for up to 5 days.  Dispense: 20 tablet; Refill: 0  3. Other fatigue - B12 and Folate Panel - Vitamin D (25 hydroxy) - Fe+TIBC+Fer  4. Mixed hyperlipidemia Continue lipitor  5. Need for shingles vaccine - Varicella-zoster vaccine subcutaneous  6. Screening for osteoporosis - DG Bone Density; Future  7. Dysuria - UA/M w/rflx Culture, Routine  General Counseling: ayssa bentivegna understanding of the findings of todays visit and agrees with plan of treatment. I have discussed any further diagnostic evaluation that may be needed or ordered today. We also reviewed her medications today. she has been encouraged to call the office with any questions or concerns that should arise related to todays visit.   Orders Placed This Encounter  Procedures  . Microscopic Examination  . DG Bone Density  . Varicella-zoster vaccine subcutaneous  . Flu Vaccine MDCK QUAD PF  . UA/M w/rflx Culture, Routine  . CBC with Differential/Platelet  . Lipid Panel With LDL/HDL Ratio  . TSH  . T4, free  . Comprehensive metabolic panel  . B12 and Folate Panel  . Vitamin D (25 hydroxy)  . Fe+TIBC+Fer    Meds ordered this encounter  Medications  . traMADol (ULTRAM) 50 MG tablet    Sig: Take 1 tablet (50 mg total) by mouth every 8 (eight) hours as needed for up to 5 days.    Dispense:  20 tablet    Refill:  0    Time spent: 30 Minutes   This patient was seen by Orson Gear  AGNP-C in Collaboration with Dr Lavera Guise as a part of collaborative care agreement    Kendell Bane AGNP-C Internal Medicine

## 2020-06-03 LAB — UA/M W/RFLX CULTURE, ROUTINE
Bilirubin, UA: NEGATIVE
Glucose, UA: NEGATIVE
Ketones, UA: NEGATIVE
Leukocytes,UA: NEGATIVE
Nitrite, UA: NEGATIVE
Protein,UA: NEGATIVE
RBC, UA: NEGATIVE
Specific Gravity, UA: 1.015 (ref 1.005–1.030)
Urobilinogen, Ur: 0.2 mg/dL (ref 0.2–1.0)
pH, UA: 6.5 (ref 5.0–7.5)

## 2020-06-03 LAB — MICROSCOPIC EXAMINATION
Bacteria, UA: NONE SEEN
Casts: NONE SEEN /lpf

## 2020-06-23 DIAGNOSIS — M79662 Pain in left lower leg: Secondary | ICD-10-CM | POA: Diagnosis not present

## 2020-06-23 DIAGNOSIS — M545 Low back pain, unspecified: Secondary | ICD-10-CM | POA: Diagnosis not present

## 2020-07-03 ENCOUNTER — Ambulatory Visit: Payer: Medicare Other | Admitting: Nurse Practitioner

## 2020-09-08 ENCOUNTER — Ambulatory Visit
Admission: EM | Admit: 2020-09-08 | Discharge: 2020-09-08 | Disposition: A | Discharge status: Home / Self Care | Payer: Medicare Other | Attending: Family Medicine | Admitting: Family Medicine

## 2020-09-08 ENCOUNTER — Other Ambulatory Visit: Payer: Self-pay

## 2020-09-08 ENCOUNTER — Encounter: Payer: Self-pay | Admitting: Emergency Medicine

## 2020-09-08 DIAGNOSIS — J441 Chronic obstructive pulmonary disease with (acute) exacerbation: Secondary | ICD-10-CM

## 2020-09-08 DIAGNOSIS — U071 COVID-19: Secondary | ICD-10-CM | POA: Insufficient documentation

## 2020-09-08 DIAGNOSIS — Z20822 Contact with and (suspected) exposure to covid-19: Secondary | ICD-10-CM

## 2020-09-08 MED ORDER — BENZONATATE 200 MG PO CAPS: 200.0000 mg | ORAL_CAPSULE | Freq: Three times a day (TID) | ORAL | 0 refills | Status: DC | PRN

## 2020-09-08 MED ORDER — DOXYCYCLINE HYCLATE 100 MG PO CAPS: 100.0000 mg | ORAL_CAPSULE | Freq: Two times a day (BID) | ORAL | 0 refills | Status: DC

## 2020-09-08 MED ORDER — PREDNISONE 50 MG PO TABS: ORAL_TABLET | ORAL | 0 refills | Status: DC

## 2020-09-08 NOTE — ED Triage Notes (Signed)
Pt present with cough, chest buring, fever 102.0 at home this a.m. Took Tylenol this a.m.

## 2020-09-08 NOTE — Discharge Instructions (Signed)
Medication as prescribed.  Stay home.  Check my chart for COVID test results.  Take care  Dr. Luciel Brickman   

## 2020-09-09 ENCOUNTER — Telehealth: Payer: Self-pay | Admitting: Family Medicine

## 2020-09-09 LAB — SARS CORONAVIRUS 2 (TAT 6-24 HRS): SARS Coronavirus 2: POSITIVE — AB

## 2020-09-09 NOTE — ED Provider Notes (Signed)
[facility]    CSN: [id number] Arrival date & time: 09/08/20  1411      History   Chief Complaint Chief Complaint  Patient presents with  . Cough  . Chest Pain   HPI  65 year old female presents with respiratory symptoms.  Patient states that she has been sick since yesterday.  She reports cough, fever, chest discomfort, body aches.  Fever has been as high as 102.  She has known COPD and other comorbidities.  No relieving factors.  No reported sick contacts.  No known exacerbating factors.  No other associated symptoms.  No other complaints.  Past Medical History:  Diagnosis Date  . Bipolar affective disorder ([place])   . Coronary artery disease   . Hyperlipidemia   . Hypertension   . Hypothyroid   . Myocardial infarction ([place])   . Stroke [facility]) 11/15/2016   "right side weaker since" (12/20/2016)    Patient Active Problem List   Diagnosis Date Noted  . Acute non-recurrent frontal sinusitis 01/24/2020  . Seasonal allergic rhinitis due to pollen 01/24/2020  . Acute upper respiratory infection 12/08/2019  . Intractable vomiting 12/08/2019  . S/P drug eluting coronary stent placement 04/13/2018  . SVT (supraventricular tachycardia) ([place]) 04/10/2018  . Hyperlipidemia, unspecified 12/01/2017  . Choledocholithiasis with acute cholecystitis 12/19/2016  . Coronary artery disease 12/19/2016  . Essential hypertension 12/19/2016  . Hypothyroidism 12/19/2016  . History of stroke 12/19/2016  . Migraine 06/17/2016  . Cognitive deficit, post-stroke 05/18/2015  . Chest pain 05/12/2015  . Right facial numbness 03/30/2015  . Lithium intoxication 02/27/2015  . Bipolar disorder ([place]) 02/27/2015  . CVA (cerebral infarction) 02/26/2015  . Unstable angina ([place]) 02/03/2015  . Tobacco use 01/19/2013  . Temporomandibular joint disorder 01/02/2013  . Tarsal tunnel syndrome 07/21/2012  . 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 Plantar fascial fibromatosis  07/21/2012  . 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 ABDOMINAL HYSTERECTOMY    . APPENDECTOMY    . CARDIAC CATHETERIZATION Left 09/26/2015   Procedure: Left Heart Cath and Coronary Angiography;  Surgeon: Teodoro Spray, MD;  Location: [place] [facility];  Service: Cardiovascular;  Laterality: Left;  . CHOLECYSTECTOMY N/A 12/22/2016   Procedure: LAPAROSCOPIC CHOLECYSTECTOMY WITH INTRAOPERATIVE CHOLANGIOGRAM;  Surgeon: Jovita Kussmaul, MD;  Location: [place];  Service: General;  Laterality: N/A;  . CORONARY STENT INTERVENTION N/A 04/13/2018   Procedure: CORONARY STENT INTERVENTION;  Surgeon: Yolonda Kida, MD;  Location: [place] [facility];  Service: Cardiovascular;  Laterality: N/A;  . ERCP N/A 12/20/2016   Procedure: ENDOSCOPIC RETROGRADE CHOLANGIOPANCREATOGRAPHY (ERCP);  Surgeon: Teena Irani, MD;  Location: [facility];  Service: Endoscopy;  Laterality: N/A;  . HYSTEROTOMY    . LEFT HEART CATH AND CORONARY ANGIOGRAPHY Left 04/13/2018   Procedure: LEFT HEART CATH AND CORONARY ANGIOGRAPHY;  Surgeon: Teodoro Spray, MD;  Location: [place] [facility];  Service: Cardiovascular;  Laterality: Left;  . LEFT HEART CATH AND CORONARY ANGIOGRAPHY Left 12/25/2019   Procedure: LEFT HEART CATH AND CORONARY ANGIOGRAPHY;  Surgeon: Teodoro Spray, MD;  Location: [place] [facility];  Service: Cardiovascular;  Laterality: Left;    OB History   No obstetric history on file.      Home Medications    Prior to Admission medications   Medication Sig Start Date End Date Taking? Authorizing Provider  benzonatate (TESSALON) 200 MG capsule Take 1 capsule (200 mg total)  by mouth 3 (three) times daily as needed for cough. 09/08/20  Yes Lathan Gieselman G, DO  doxycycline (VIBRAMYCIN) 100 MG capsule Take 1 capsule (100 mg total) by mouth 2 (two) times daily. 09/08/20  Yes Armentha Branagan G, DO  predniSONE (DELTASONE) 50 MG tablet 1 tablet daily x 5  days 09/08/20  Yes Mcgwire Dasaro G, DO  acetaminophen (TYLENOL) 500 MG tablet Take 1,000 mg by mouth every 6 (six) hours as needed for mild pain or headache.    [provider]  albuterol (PROVENTIL HFA;VENTOLIN HFA) 108 (90 BASE) MCG/ACT inhaler Inhale 2 puffs into the lungs every 6 (six) hours as needed for wheezing or shortness of breath.    [provider]  albuterol (PROVENTIL) (2.5 MG/3ML) 0.083% nebulizer solution USE 1 VIAL VIA NEBULIZER  EVERY 4 TO 6 HOURS AS  NEEDED Patient taking differently: Take 2.5 mg by nebulization every 4 (four) hours as needed for wheezing or shortness of breath.  09/07/17   Lavera Guise, MD  atorvastatin (LIPITOR) 10 MG tablet Take 10 mg by mouth daily. Take 1 tab po daily 04/11/19   [provider]  Calcium Carbonate-Vitamin D 600-200 MG-UNIT CAPS Take 1 tablet by mouth daily.     [provider]  citalopram (CELEXA) 40 MG tablet TAKE 1 TABLET BY MOUTH  DAILY 04/25/20   Lavera Guise, MD  clopidogrel (PLAVIX) 75 MG tablet Take 75 mg by mouth daily.     [provider]  diltiazem (CARDIZEM CD) 120 MG 24 hr capsule Take 120 mg by mouth daily.  01/16/19 01/16/20  [provider]  diltiazem (CARDIZEM) 30 MG tablet Take 30 mg by mouth daily as needed (Heart palpitations).  10/25/19   [provider]  fluticasone (FLONASE) 50 MCG/ACT nasal spray USE 1 SPRAY IN EACH NOSTRIL TWO TIMES DAILY Patient taking differently: Place 1 spray into both nostrils daily.  09/07/17   Lavera Guise, MD  isosorbide mononitrate (IMDUR) 30 MG 24 hr tablet Take 30 mg by mouth daily.    [provider]  levothyroxine (SYNTHROID) 100 MCG tablet TAKE 1 TABLET BY MOUTH  DAILY 05/19/20   Lavera Guise, MD  lisinopril (PRINIVIL,ZESTRIL) 10 MG tablet Take 10 mg by mouth daily.    [provider]  loratadine (CLARITIN) 10 MG tablet Take 10 mg by mouth daily.    [provider]  lubiprostone (AMITIZA) 8 MCG capsule Take 8  mcg by mouth daily as needed for constipation.     [provider]  metoprolol tartrate (LOPRESSOR) 25 MG tablet Take 12.5 mg by mouth daily.     [provider]  nitroGLYCERIN (NITROSTAT) 0.4 MG SL tablet Place 0.4 mg under the tongue every 5 (five) minutes as needed for chest pain.    [provider]  ondansetron (ZOFRAN-ODT) 4 MG disintegrating tablet Take 1 tablet (4 mg total) by mouth every 8 (eight) hours as needed for nausea or vomiting. 11/27/19   Ronnell [place], NP  pantoprazole (PROTONIX) 40 MG tablet TAKE 1 TABLET BY MOUTH  DAILY 02/25/20   Boscia, Heather E, NP  SYRINGE/NEEDLE, DISP, 1 ML 23G X 1" 1 ML MISC 1 each by Does not apply route once a week. 07/26/19   Kendell Bane, NP    Family History Family History  Problem Relation Age of Onset  . CAD Other     Social History Social History   Tobacco Use  . Smoking status: Current Every Day  Smoker    Packs/day: 0.50    Years: 45.00    Pack years: 22.50    Types: Cigarettes, E-cigarettes  . Smokeless tobacco: Never Used  . Tobacco comment: was up to two packs a day  Substance Use Topics  . Alcohol use: Yes    Comment: occasionally   . Drug use: No    Comment: CBD gummies     Allergies   Morphine, Sulfa antibiotics, Sulfamethoxazole-trimethoprim, Azithromycin, Pregabalin, Asa [aspirin], and Rofecoxib   Review of Systems Review of Systems Per HPI  Physical Exam Triage Vital Signs ED Triage Vitals  Enc Vitals Group     BP 09/08/20 1547 (!) 130/113     Pulse Rate 09/08/20 1547 60     Resp 09/08/20 1547 18     Temp 09/08/20 1547 98.4 F (36.9 C)     Temp Source 09/08/20 1547 Oral     SpO2 09/08/20 1547 98 %     Weight 09/08/20 1549 135 lb (61.2 kg)     Height 09/08/20 1549 5' (1.524 m)     Head Circumference --      Peak Flow --      Pain Score 09/08/20 1548 6     Pain Loc --      Pain Edu? --      Excl. in [place]? --    Updated Vital Signs BP (!) 130/113 (BP Location: Left  Arm)   Pulse 60   Temp 98.4 F (36.9 C) (Oral)   Resp 18   Ht 5' (1.524 m)   Wt 61.2 kg   SpO2 98%   BMI 26.37 kg/m   Visual Acuity Right Eye Distance:   Left Eye Distance:   Bilateral Distance:    Right Eye Near:   Left Eye Near:    Bilateral Near:     Physical Exam Constitutional:      Comments: Appears mildly ill.  HENT:     Head: Normocephalic and atraumatic.  Eyes:     General:        Right eye: No discharge.        Left eye: No discharge.     Conjunctiva/sclera: Conjunctivae normal.  Cardiovascular:     Rate and Rhythm: Normal rate and regular rhythm.  Pulmonary:     Effort: Pulmonary effort is normal. No respiratory distress.     Comments: Coarse breath sounds. Neurological:     Mental Status: She is alert.  Psychiatric:        Mood and Affect: Mood normal.        Behavior: Behavior normal.    UC Treatments / Results  Labs (all labs ordered are listed, but only abnormal results are displayed) Labs Reviewed  SARS CORONAVIRUS 2 (TAT 6-24 HRS) - Abnormal; Notable for the following components:      Result Value   SARS Coronavirus 2 POSITIVE (*)    All other components within normal limits    EKG   Radiology No results found.  Procedures Procedures (including critical care time)  Medications Ordered in UC Medications - No data to display  Initial Impression / Assessment and Plan / UC Course  I have reviewed the triage vital signs and the nursing notes.  Pertinent labs & imaging results that were available during my care of the patient were reviewed by me and considered in my medical decision making (see chart for details).    64 year old female presents with respiratory symptoms.  Patient appears to be experiencing a  COPD exacerbation.  Additionally, patient has tested positive COVID-19.  Placing on doxycycline, prednisone, Tessalon Perles.  Referral to infusion clinic placed (see telephone encounter).  Final Clinical Impressions(s) / UC  Diagnoses   Final diagnoses:  COPD exacerbation ([place])  COVID     Discharge Instructions     Medication as prescribed.  Stay home.  Check my chart for COVID test results.  Take care  Dr. Lacinda Axon     ED Prescriptions    Medication Sig Dispense Auth. Provider   doxycycline (VIBRAMYCIN) 100 MG capsule Take 1 capsule (100 mg total) by mouth 2 (two) times daily. 14 capsule Iliana Hutt G, DO   predniSONE (DELTASONE) 50 MG tablet 1 tablet daily x 5 days 5 tablet Levoy Geisen G, DO   benzonatate (TESSALON) 200 MG capsule Take 1 capsule (200 mg total) by mouth 3 (three) times daily as needed for cough. 30 capsule Coral Spikes, DO     PDMP not reviewed this encounter.   Coral Spikes, [place] 09/09/20 [phone]

## 2020-09-09 NOTE — Telephone Encounter (Signed)
COVID positive. Referral to infusion clinic placed.  [facility]

## 2020-09-10 ENCOUNTER — Telehealth (HOSPITAL_COMMUNITY): Payer: Self-pay | Admitting: Family

## 2020-09-10 NOTE — Telephone Encounter (Signed)
Called to discuss with patient about COVID-19 symptoms and the use of one of the available treatments for those with mild to moderate Covid symptoms and at a high risk of hospitalization.  Pt appears to qualify for outpatient treatment due to co-morbid conditions and/or a member of an at-risk group in accordance with the FDA Emergency Use Authorization.    Unable to reach pt - VM left  Carol [name]   

## 2020-09-11 ENCOUNTER — Other Ambulatory Visit: Payer: Self-pay | Admitting: Physician Assistant

## 2020-09-11 DIAGNOSIS — I2510 Atherosclerotic heart disease of native coronary artery without angina pectoris: Secondary | ICD-10-CM

## 2020-09-11 DIAGNOSIS — Z8673 Personal history of transient ischemic attack (TIA), and cerebral infarction without residual deficits: Secondary | ICD-10-CM

## 2020-09-11 DIAGNOSIS — U071 COVID-19: Secondary | ICD-10-CM

## 2020-09-11 DIAGNOSIS — Z720 Tobacco use: Secondary | ICD-10-CM

## 2020-09-11 DIAGNOSIS — I10 Essential (primary) hypertension: Secondary | ICD-10-CM

## 2020-09-11 DIAGNOSIS — F319 Bipolar disorder, unspecified: Secondary | ICD-10-CM

## 2020-09-11 NOTE — Progress Notes (Signed)
I connected by phone with Carol [name] on 09/11/2020 at 3:31 PM to discuss the potential use of a new treatment for mild to moderate COVID-19 viral infection in non-hospitalized patients.  This patient is a 65 y.o. female that meets the FDA criteria for Emergency Use Authorization of COVID monoclonal antibody sotrovimab.  Has a (+) direct SARS-CoV-2 viral test result  Has mild or moderate COVID-19   Is NOT hospitalized due to COVID-19  Is within 10 days of symptom onset  Has at least one of the high risk factor(s) for progression to severe COVID-19 and/or hospitalization as defined in EUA.  Specific high risk criteria : Older age (>/= 67 yo), BMI > 25, Cardiovascular disease or hypertension and Other high risk medical condition per CDC:  vaccinated but not boosted, high SVI   I have spoken and communicated the following to the patient or parent/caregiver regarding COVID monoclonal antibody treatment:  1. FDA has authorized the emergency use for the treatment of mild to moderate COVID-19 in adults and pediatric patients with positive results of direct SARS-CoV-2 viral testing who are 9 years of age and older weighing at least 40 kg, and who are at high risk for progressing to severe COVID-19 and/or hospitalization.  2. The significant known and potential risks and benefits of COVID monoclonal antibody, and the extent to which such potential risks and benefits are unknown.  3. Information on available alternative treatments and the risks and benefits of those alternatives, including clinical trials.  4. Patients treated with COVID monoclonal antibody should continue to self-isolate and use infection control measures (e.g., wear mask, isolate, social distance, avoid sharing personal items, clean and disinfect "high touch" surfaces, and frequent handwashing) according to CDC guidelines.   5. The patient or parent/caregiver has the option to accept or refuse COVID monoclonal antibody  treatment.  After reviewing this information with the patient, the patient has agreed to receive one of the available covid 19 monoclonal antibodies and will be provided an appropriate fact sheet prior to infusion.   Pt is vaccinated but not boosted. Sx onset 1/23. Set up for infusion tomorrow 1/28 @ 10:30am.   Angelena Form, PA-C 09/11/2020 3:31 PM

## 2020-09-12 ENCOUNTER — Ambulatory Visit (HOSPITAL_COMMUNITY)
Admission: RE | Admit: 2020-09-12 | Discharge: 2020-09-12 | Disposition: A | Discharge status: Home / Self Care | Payer: Medicare Other | Source: Ambulatory Visit | Attending: Pulmonary Disease | Admitting: Pulmonary Disease

## 2020-09-12 DIAGNOSIS — I2510 Atherosclerotic heart disease of native coronary artery without angina pectoris: Secondary | ICD-10-CM

## 2020-09-12 DIAGNOSIS — U071 COVID-19: Secondary | ICD-10-CM | POA: Diagnosis not present

## 2020-09-12 DIAGNOSIS — I10 Essential (primary) hypertension: Secondary | ICD-10-CM | POA: Diagnosis not present

## 2020-09-12 DIAGNOSIS — Z8673 Personal history of transient ischemic attack (TIA), and cerebral infarction without residual deficits: Secondary | ICD-10-CM | POA: Diagnosis not present

## 2020-09-12 DIAGNOSIS — Z720 Tobacco use: Secondary | ICD-10-CM | POA: Diagnosis not present

## 2020-09-12 DIAGNOSIS — F319 Bipolar disorder, unspecified: Secondary | ICD-10-CM

## 2020-09-12 MED ORDER — ALBUTEROL SULFATE HFA 108 (90 BASE) MCG/ACT IN AERS: 2.0000 | INHALATION_SPRAY | Freq: Once | RESPIRATORY_TRACT | Status: DC | PRN

## 2020-09-12 MED ORDER — METHYLPREDNISOLONE SODIUM SUCC 125 MG IJ SOLR: 125.0000 mg | Freq: Once | INTRAMUSCULAR | Status: DC | PRN

## 2020-09-12 MED ORDER — SODIUM CHLORIDE 0.9 % IV SOLN: INTRAVENOUS | Status: DC | PRN

## 2020-09-12 MED ORDER — EPINEPHRINE 0.3 MG/0.3ML IJ SOAJ: 0.3000 mg | Freq: Once | INTRAMUSCULAR | Status: DC | PRN

## 2020-09-12 MED ORDER — SOTROVIMAB 500 MG/8ML IV SOLN
500.0000 mg | Freq: Once | INTRAVENOUS | Status: AC
  Administered 2020-09-12: 500 mg via INTRAVENOUS

## 2020-09-12 MED ORDER — DIPHENHYDRAMINE HCL 50 MG/ML IJ SOLN: 50.0000 mg | Freq: Once | INTRAMUSCULAR | Status: DC | PRN

## 2020-09-12 MED ORDER — FAMOTIDINE IN NACL 20-0.9 MG/50ML-% IV SOLN: 20.0000 mg | Freq: Once | INTRAVENOUS | Status: DC | PRN

## 2020-09-12 NOTE — Progress Notes (Signed)
Diagnosis: COVID-19  Physician: Dr. Patrick Wright  Procedure: Covid Infusion Clinic Med: Sotrovimab infusion - Provided patient with sotrovimab fact sheet for patients, parents, and caregivers prior to infusion.   Complications: No immediate complications noted  Discharge: Discharged home    

## 2020-09-12 NOTE — Progress Notes (Signed)
Patient reviewed Fact Sheet for Patients, Parents, and Caregivers for Emergency Use Authorization (EUA) of Sotrovimab for the Treatment of Coronavirus. Patient also reviewed and is agreeable to the estimated cost of treatment. Patient is agreeable to proceed.   

## 2020-09-12 NOTE — Discharge Instructions (Signed)

## 2020-11-13 ENCOUNTER — Ambulatory Visit: Payer: Medicare Other | Admitting: Physician Assistant

## 2020-11-21 ENCOUNTER — Ambulatory Visit
Admission: RE | Admit: 2020-11-21 | Discharge: 2020-11-21 | Disposition: A | Discharge status: Home / Self Care | Payer: Medicare Other | Source: Ambulatory Visit | Attending: Emergency Medicine | Admitting: Emergency Medicine

## 2020-11-21 ENCOUNTER — Other Ambulatory Visit: Payer: Self-pay

## 2020-11-21 VITALS — BP 156/66 | HR 55 | Temp 98.1°F | Resp 14 | Ht 60.0 in | Wt 135.0 lb

## 2020-11-21 DIAGNOSIS — H66001 Acute suppurative otitis media without spontaneous rupture of ear drum, right ear: Secondary | ICD-10-CM | POA: Diagnosis not present

## 2020-11-21 MED ORDER — CIPROFLOXACIN-DEXAMETHASONE 0.3-0.1 % OT SUSP: 4.0000 [drp] | Freq: Two times a day (BID) | OTIC | 0 refills | Status: DC

## 2020-11-21 MED ORDER — AMOXICILLIN-POT CLAVULANATE 875-125 MG PO TABS: 1.0000 | ORAL_TABLET | Freq: Two times a day (BID) | ORAL | 0 refills | Status: AC

## 2020-11-21 NOTE — Discharge Instructions (Addendum)
Instill 4 drops of the Ciprodex in your right ear twice daily for 7 days for treatment of ear infection.  If your symptoms not improve return for reevaluation or follow-up with ENT.

## 2020-11-21 NOTE — ED Triage Notes (Signed)
Patient reports pain, fluid and pressure in her right ear that started a week ago.  Patient denies fevers.

## 2020-11-21 NOTE — ED Provider Notes (Addendum)
[facility]    CSN: [id number] Arrival date & time: 11/21/20  1048      History   Chief Complaint Chief Complaint  Patient presents with  . Appointment  . Otalgia    HPI Carol [name] is a 65 y.o. female.   HPI   65 year old female here for evaluation of right ear pain and pressure x1 week.  Patient reports that she has a tube in her right ear and she has been noticing intermittent drainage of a yellow discharge.  Patient also has allergies and has had some intermittent nasal congestion with a clear nasal discharge.  Patient denies any fever, ringing in ears, sore throat, or cough.  Patient is a smoker.  Patient states that she is followed by [facility]NT but she cannot get into see them today so they recommended she come here.  Patient also reports that typically she is treated with Ciprodex when she does have an ear infection.  Past Medical History:  Diagnosis Date  . Bipolar affective disorder ([place])   . Coronary artery disease   . Hyperlipidemia   . Hypertension   . Hypothyroid   . Myocardial infarction ([place])   . Stroke [facility]) 11/15/2016   "right side weaker since" (12/20/2016)    Patient Active Problem List   Diagnosis Date Noted  . Acute non-recurrent frontal sinusitis 01/24/2020  . Seasonal allergic rhinitis due to pollen 01/24/2020  . Acute upper respiratory infection 12/08/2019  . Intractable vomiting 12/08/2019  . S/P drug eluting coronary stent placement 04/13/2018  . SVT (supraventricular tachycardia) ([place]) 04/10/2018  . Hyperlipidemia, unspecified 12/01/2017  . Choledocholithiasis with acute cholecystitis 12/19/2016  . Coronary artery disease 12/19/2016  . Essential hypertension 12/19/2016  . Hypothyroidism 12/19/2016  . History of stroke 12/19/2016  . Migraine 06/17/2016  . Cognitive deficit, post-stroke 05/18/2015  . Chest pain 05/12/2015  . Right facial numbness 03/30/2015  . Lithium intoxication 02/27/2015  . Bipolar disorder ([place])  02/27/2015  . CVA (cerebral infarction) 02/26/2015  . Unstable angina ([place]) 02/03/2015  . Tobacco use 01/19/2013  . Temporomandibular joint disorder 01/02/2013  . Tarsal tunnel syndrome 07/21/2012  . 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 Plantar fascial fibromatosis 07/21/2012  . 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 ABDOMINAL HYSTERECTOMY    . APPENDECTOMY    . CARDIAC CATHETERIZATION Left 09/26/2015   Procedure: Left Heart Cath and Coronary Angiography;  Surgeon: Teodoro Spray, MD;  Location: [place] [facility];  Service: Cardiovascular;  Laterality: Left;  . CHOLECYSTECTOMY N/A 12/22/2016   Procedure: LAPAROSCOPIC CHOLECYSTECTOMY WITH INTRAOPERATIVE CHOLANGIOGRAM;  Surgeon: Jovita Kussmaul, MD;  Location: [place];  Service: General;  Laterality: N/A;  . CORONARY STENT INTERVENTION N/A 04/13/2018   Procedure: CORONARY STENT INTERVENTION;  Surgeon: Yolonda Kida, MD;  Location: [place] [facility];  Service: Cardiovascular;  Laterality: N/A;  . ERCP N/A 12/20/2016   Procedure: ENDOSCOPIC RETROGRADE CHOLANGIOPANCREATOGRAPHY (ERCP);  Surgeon: Teena Irani, MD;  Location: [facility] ENDOSCOPY;  Service: Endoscopy;  Laterality: N/A;  . HYSTEROTOMY    . LEFT HEART CATH AND CORONARY ANGIOGRAPHY Left 04/13/2018   Procedure: LEFT HEART CATH AND CORONARY ANGIOGRAPHY;  Surgeon: Teodoro Spray, MD;  Location: [place] [facility];  Service: Cardiovascular;  Laterality: Left;  . LEFT HEART CATH AND CORONARY ANGIOGRAPHY Left 12/25/2019   Procedure: LEFT HEART CATH AND CORONARY ANGIOGRAPHY;  Surgeon: Teodoro Spray, MD;  Location: [place] [facility];  Service: Cardiovascular;  Laterality: Left;    OB History   No obstetric history on file.      Home Medications    Prior to Admission medications   Medication Sig Start Date End Date Taking? Authorizing Provider  albuterol (PROVENTIL HFA;VENTOLIN HFA) 108 (90  BASE) MCG/ACT inhaler Inhale 2 puffs into the lungs every 6 (six) hours as needed for wheezing or shortness of breath.   Yes [provider]  albuterol (PROVENTIL) (2.5 MG/3ML) 0.083% nebulizer solution USE 1 VIAL VIA NEBULIZER  EVERY 4 TO 6 HOURS AS  NEEDED Patient taking differently: Take 2.5 mg by nebulization every 4 (four) hours as needed for wheezing or shortness of breath. 09/07/17  Yes Lavera Guise, MD  atorvastatin (LIPITOR) 10 MG tablet Take 10 mg by mouth daily. Take 1 tab po daily 04/11/19  Yes [provider]  Calcium Carbonate-Vitamin D 600-200 MG-UNIT CAPS Take 1 tablet by mouth daily.    Yes [provider]  ciprofloxacin-dexamethasone (CIPRODEX) OTIC suspension Place 4 drops into the right ear 2 (two) times daily for 7 days. 11/21/20 11/28/20 Yes Margarette Canada, NP  citalopram (CELEXA) 40 MG tablet TAKE 1 TABLET BY MOUTH  DAILY 04/25/20  Yes Lavera Guise, MD  clopidogrel (PLAVIX) 75 MG tablet Take 75 mg by mouth daily.    Yes [provider]  diltiazem (CARDIZEM CD) 120 MG 24 hr capsule Take 120 mg by mouth daily.  01/16/19 11/21/20 Yes [provider]  diltiazem (CARDIZEM) 30 MG tablet Take 30 mg by mouth daily as needed (Heart palpitations).  10/25/19  Yes [provider]  isosorbide mononitrate (IMDUR) 30 MG 24 hr tablet Take 30 mg by mouth daily.   Yes [provider]  levothyroxine (SYNTHROID) 100 MCG tablet TAKE 1 TABLET BY MOUTH  DAILY 05/19/20  Yes Lavera Guise, MD  lisinopril (PRINIVIL,ZESTRIL) 10 MG tablet Take 10 mg by mouth daily.   Yes [provider]  loratadine (CLARITIN) 10 MG tablet Take 10 mg by mouth daily.   Yes [provider]  lubiprostone (AMITIZA) 8 MCG capsule Take 8 mcg by mouth daily as needed for constipation.    Yes [provider]  metoprolol tartrate (LOPRESSOR) 25 MG tablet Take 12.5 mg by mouth daily.    Yes [provider]  pantoprazole (PROTONIX) 40 MG tablet  TAKE 1 TABLET BY MOUTH  DAILY 02/25/20  Yes Boscia, Heather E, NP  acetaminophen (TYLENOL) 500 MG tablet Take 1,000 mg by mouth every 6 (six) hours as needed for mild pain or headache.    [provider]  benzonatate (TESSALON) 200 MG capsule Take 1 capsule (200 mg total) by mouth 3 (three) times daily as needed for cough. 09/08/20   Coral Spikes, DO  fluticasone (FLONASE) 50 MCG/ACT nasal spray USE 1 SPRAY IN EACH NOSTRIL TWO TIMES DAILY Patient taking differently: Place 1 spray into both nostrils daily.  09/07/17   Lavera Guise, MD  nitroGLYCERIN (NITROSTAT) 0.4 MG SL tablet Place 0.4 mg under the tongue every 5 (five) minutes as needed for chest pain.    [provider]  ondansetron (ZOFRAN-ODT) 4 MG disintegrating tablet Take 1 tablet (4 mg total) by mouth every 8 (eight) hours as needed for nausea or vomiting. 11/27/19   Boscia, Heather E, NP  SYRINGE/NEEDLE, DISP, 1 ML 23G X 1" 1 ML MISC 1 each by Does not apply route once a week. 07/26/19  Kendell Bane, NP    Family History Family History  Problem Relation Age of Onset  . CAD Other     Social History Social History   Tobacco Use  . Smoking status: Current Every Day Smoker    Packs/day: 0.50    Years: 45.00    Pack years: 22.50    Types: Cigarettes, E-cigarettes  . Smokeless tobacco: Never Used  . Tobacco comment: was up to two packs a day  Substance Use Topics  . Alcohol use: Yes    Comment: occasionally   . Drug use: No    Comment: CBD gummies     Allergies   Morphine, Sulfa antibiotics, Sulfamethoxazole-trimethoprim, Azithromycin, Pregabalin, Asa [aspirin], and Rofecoxib   Review of Systems Review of Systems  Constitutional: Negative for fever.  HENT: Positive for congestion, ear discharge, ear pain and rhinorrhea. Negative for sore throat and tinnitus.   Respiratory: Negative for cough.   Skin: Negative for rash.  Neurological: Negative for dizziness.  Hematological: Negative.    Psychiatric/Behavioral: Negative.      Physical Exam Triage Vital Signs ED Triage Vitals  Enc Vitals Group     BP 11/21/20 1111 (!) 156/66     Pulse Rate 11/21/20 1111 (!) 55     Resp 11/21/20 1111 14     Temp 11/21/20 1111 98.1 F (36.7 C)     Temp Source 11/21/20 1111 Oral     SpO2 11/21/20 1111 99 %     Weight 11/21/20 1108 135 lb (61.2 kg)     Height 11/21/20 1108 5' (1.524 m)     Head Circumference --      Peak Flow --      Pain Score 11/21/20 1108 5     Pain Loc --      Pain Edu? --      Excl. in [place]? --    No data found.  Updated Vital Signs BP (!) 156/66 (BP Location: Right Arm)   Pulse (!) 55   Temp 98.1 F (36.7 C) (Oral)   Resp 14   Ht 5' (1.524 m)   Wt 135 lb (61.2 kg)   SpO2 99%   BMI 26.37 kg/m   Visual Acuity Right Eye Distance:   Left Eye Distance:   Bilateral Distance:    Right Eye Near:   Left Eye Near:    Bilateral Near:     Physical Exam Vitals and nursing note reviewed.  Constitutional:      General: She is not in acute distress.    Appearance: Normal appearance. She is normal weight.  HENT:     Head: Normocephalic and atraumatic.     Right Ear: Ear canal and external ear normal.     Left Ear: Ear canal and external ear normal.  Skin:    General: Skin is warm and dry.     Capillary Refill: Capillary refill takes less than 2 seconds.     Findings: No erythema or rash.  Neurological:     General: No focal deficit present.     Mental Status: She is alert and oriented to person, place, and time.  Psychiatric:        Mood and Affect: Mood normal.        Behavior: Behavior normal.        Thought Content: Thought content normal.        Judgment: Judgment normal.      UC Treatments / Results  Labs (all labs ordered are listed,  but only abnormal results are displayed) Labs Reviewed - No data to display  EKG   Radiology No results found.  Procedures Procedures (including critical care time)  Medications Ordered in  UC Medications - No data to display  Initial Impression / Assessment and Plan / UC Course  I have reviewed the triage vital signs and the nursing notes.  Pertinent labs & imaging results that were available during my care of the patient were reviewed by me and considered in my medical decision making (see chart for details).   Patient is a very pleasant 65 year old female here for evaluation of pain and drainage from her right ear that has been going on for the last week.  Patient currently has a tube in her right ear and is typically followed by [facility] ENT.  Patient states that she has been getting an intermittent yellow drainage from her ear that waxes and wanes with pain and pressure.  Physical exam reveals the presence of a TM tube in the right tympanic membrane that is draining a thick yellow discharge.  The tympanic membrane is mildly erythematous.  Left TM reveals a hole where TM tube used to be and is pearly gray with a normal light reflex.  Patient's exam is consistent with right otitis media.  Will treat with Ciprodex twice daily for 07 days and will have patient follow-up with ENT if her symptoms do not improve.  Patient called to ask for different antibiotic as the Ciprodex drops are not present at the pharmacy and they are very expensive.  Will switch patient to Augmentin 875 mg twice daily x10 days.   Final Clinical Impressions(s) / UC Diagnoses   Final diagnoses:  Non-recurrent acute suppurative otitis media of right ear without spontaneous rupture of tympanic membrane   Discharge Instructions   None    ED Prescriptions    Medication Sig Dispense Auth. Provider   ciprofloxacin-dexamethasone (CIPRODEX) OTIC suspension Place 4 drops into the right ear 2 (two) times daily for 7 days. 7.5 mL Margarette Canada, NP     PDMP not reviewed this encounter.   Margarette Canada, NP 11/21/20 1234    Margarette Canada, NP 11/21/20 1324

## 2020-11-26 DIAGNOSIS — M542 Cervicalgia: Secondary | ICD-10-CM | POA: Diagnosis not present

## 2020-11-26 DIAGNOSIS — E039 Hypothyroidism, unspecified: Secondary | ICD-10-CM | POA: Diagnosis not present

## 2020-11-26 DIAGNOSIS — M546 Pain in thoracic spine: Secondary | ICD-10-CM | POA: Diagnosis not present

## 2020-11-26 DIAGNOSIS — S060X0A Concussion without loss of consciousness, initial encounter: Secondary | ICD-10-CM | POA: Diagnosis not present

## 2020-11-26 DIAGNOSIS — Z7902 Long term (current) use of antithrombotics/antiplatelets: Secondary | ICD-10-CM | POA: Diagnosis not present

## 2020-11-26 DIAGNOSIS — Z882 Allergy status to sulfonamides status: Secondary | ICD-10-CM | POA: Diagnosis not present

## 2020-11-26 DIAGNOSIS — Z8673 Personal history of transient ischemic attack (TIA), and cerebral infarction without residual deficits: Secondary | ICD-10-CM | POA: Diagnosis not present

## 2020-11-26 DIAGNOSIS — R6889 Other general symptoms and signs: Secondary | ICD-10-CM | POA: Diagnosis not present

## 2020-11-26 DIAGNOSIS — Z880 Allergy status to penicillin: Secondary | ICD-10-CM | POA: Diagnosis not present

## 2020-11-26 DIAGNOSIS — H9190 Unspecified hearing loss, unspecified ear: Secondary | ICD-10-CM | POA: Diagnosis not present

## 2020-11-26 DIAGNOSIS — E785 Hyperlipidemia, unspecified: Secondary | ICD-10-CM | POA: Diagnosis not present

## 2020-11-26 DIAGNOSIS — S199XXA Unspecified injury of neck, initial encounter: Secondary | ICD-10-CM | POA: Diagnosis not present

## 2020-11-26 DIAGNOSIS — T23011A Burn of unspecified degree of right thumb (nail), initial encounter: Secondary | ICD-10-CM | POA: Diagnosis not present

## 2020-11-26 DIAGNOSIS — Z041 Encounter for examination and observation following transport accident: Secondary | ICD-10-CM | POA: Diagnosis not present

## 2020-11-26 DIAGNOSIS — Z79899 Other long term (current) drug therapy: Secondary | ICD-10-CM | POA: Diagnosis not present

## 2020-11-26 DIAGNOSIS — R404 Transient alteration of awareness: Secondary | ICD-10-CM | POA: Diagnosis not present

## 2020-11-26 DIAGNOSIS — R930 Abnormal findings on diagnostic imaging of skull and head, not elsewhere classified: Secondary | ICD-10-CM | POA: Diagnosis not present

## 2020-11-26 DIAGNOSIS — Z886 Allergy status to analgesic agent status: Secondary | ICD-10-CM | POA: Diagnosis not present

## 2020-11-26 DIAGNOSIS — F17210 Nicotine dependence, cigarettes, uncomplicated: Secondary | ICD-10-CM | POA: Diagnosis not present

## 2020-11-26 DIAGNOSIS — Z743 Need for continuous supervision: Secondary | ICD-10-CM | POA: Diagnosis not present

## 2020-11-26 DIAGNOSIS — I2510 Atherosclerotic heart disease of native coronary artery without angina pectoris: Secondary | ICD-10-CM | POA: Diagnosis not present

## 2020-11-26 DIAGNOSIS — I252 Old myocardial infarction: Secondary | ICD-10-CM | POA: Diagnosis not present

## 2020-11-26 DIAGNOSIS — I10 Essential (primary) hypertension: Secondary | ICD-10-CM | POA: Diagnosis not present

## 2020-11-26 DIAGNOSIS — Z885 Allergy status to narcotic agent status: Secondary | ICD-10-CM | POA: Diagnosis not present

## 2020-11-26 DIAGNOSIS — M797 Fibromyalgia: Secondary | ICD-10-CM | POA: Diagnosis not present

## 2020-11-26 DIAGNOSIS — S161XXA Strain of muscle, fascia and tendon at neck level, initial encounter: Secondary | ICD-10-CM | POA: Diagnosis not present

## 2020-11-26 DIAGNOSIS — M25519 Pain in unspecified shoulder: Secondary | ICD-10-CM | POA: Diagnosis not present

## 2020-11-26 DIAGNOSIS — S40212A Abrasion of left shoulder, initial encounter: Secondary | ICD-10-CM | POA: Diagnosis not present

## 2020-12-03 DIAGNOSIS — R001 Bradycardia, unspecified: Secondary | ICD-10-CM | POA: Diagnosis not present

## 2020-12-18 DIAGNOSIS — S134XXA Sprain of ligaments of cervical spine, initial encounter: Secondary | ICD-10-CM | POA: Diagnosis not present

## 2020-12-19 DIAGNOSIS — R519 Headache, unspecified: Secondary | ICD-10-CM | POA: Diagnosis not present

## 2020-12-19 DIAGNOSIS — R262 Difficulty in walking, not elsewhere classified: Secondary | ICD-10-CM | POA: Diagnosis not present

## 2020-12-19 DIAGNOSIS — Z8673 Personal history of transient ischemic attack (TIA), and cerebral infarction without residual deficits: Secondary | ICD-10-CM | POA: Diagnosis not present

## 2020-12-19 DIAGNOSIS — M542 Cervicalgia: Secondary | ICD-10-CM | POA: Diagnosis not present

## 2020-12-19 DIAGNOSIS — H53149 Visual discomfort, unspecified: Secondary | ICD-10-CM | POA: Diagnosis not present

## 2020-12-22 ENCOUNTER — Other Ambulatory Visit: Payer: Self-pay | Admitting: Physician Assistant

## 2020-12-22 DIAGNOSIS — F0781 Postconcussional syndrome: Secondary | ICD-10-CM

## 2020-12-31 ENCOUNTER — Ambulatory Visit
Admission: RE | Admit: 2020-12-31 | Discharge: 2020-12-31 | Disposition: A | Discharge status: Home / Self Care | Payer: Medicare Other | Source: Ambulatory Visit | Attending: Physician Assistant | Admitting: Physician Assistant

## 2020-12-31 ENCOUNTER — Other Ambulatory Visit: Payer: Self-pay

## 2020-12-31 DIAGNOSIS — F0781 Postconcussional syndrome: Secondary | ICD-10-CM | POA: Insufficient documentation

## 2020-12-31 DIAGNOSIS — R519 Headache, unspecified: Secondary | ICD-10-CM | POA: Diagnosis not present

## 2021-01-29 ENCOUNTER — Other Ambulatory Visit: Payer: Self-pay | Admitting: Nurse Practitioner

## 2021-02-05 DIAGNOSIS — H2513 Age-related nuclear cataract, bilateral: Secondary | ICD-10-CM | POA: Diagnosis not present

## 2021-04-06 DIAGNOSIS — M545 Low back pain, unspecified: Secondary | ICD-10-CM | POA: Diagnosis not present

## 2021-04-14 ENCOUNTER — Ambulatory Visit: Payer: Medicare Other | Admitting: Nurse Practitioner

## 2021-04-14 DIAGNOSIS — Z0289 Encounter for other administrative examinations: Secondary | ICD-10-CM

## 2021-04-21 ENCOUNTER — Other Ambulatory Visit: Payer: Self-pay | Admitting: Internal Medicine

## 2021-04-24 ENCOUNTER — Other Ambulatory Visit: Payer: Self-pay | Admitting: Internal Medicine

## 2021-04-26 ENCOUNTER — Other Ambulatory Visit: Payer: Self-pay

## 2021-04-26 MED ORDER — CITALOPRAM HYDROBROMIDE 40 MG PO TABS: 40.0000 mg | ORAL_TABLET | Freq: Every day | ORAL | 0 refills | Status: DC

## 2021-05-03 ENCOUNTER — Other Ambulatory Visit: Payer: Self-pay | Admitting: Internal Medicine

## 2021-06-04 ENCOUNTER — Other Ambulatory Visit: Payer: Self-pay | Admitting: Internal Medicine

## 2021-07-06 ENCOUNTER — Telehealth: Payer: Self-pay

## 2021-07-06 NOTE — Telephone Encounter (Signed)
Patient called requesting appointment. I advised her that due to numerous cancellations and no shows, she must keep this appointment pay the no show fees. She agreed.  She stated she needed to be seen for chest pain & headache, per Nimisha, it is best for her to be seen at hospital. Patient agreed-Toni

## 2021-08-24 DIAGNOSIS — R059 Cough, unspecified: Secondary | ICD-10-CM | POA: Diagnosis not present

## 2021-08-24 DIAGNOSIS — Z20822 Contact with and (suspected) exposure to covid-19: Secondary | ICD-10-CM | POA: Diagnosis not present

## 2021-08-24 DIAGNOSIS — J069 Acute upper respiratory infection, unspecified: Secondary | ICD-10-CM | POA: Diagnosis not present

## 2021-08-28 ENCOUNTER — Other Ambulatory Visit: Payer: Self-pay

## 2021-08-28 ENCOUNTER — Ambulatory Visit
Admission: RE | Admit: 2021-08-28 | Discharge: 2021-08-28 | Disposition: A | Discharge status: Home / Self Care | Payer: Medicare Other | Source: Ambulatory Visit | Attending: Emergency Medicine | Admitting: Emergency Medicine

## 2021-08-28 VITALS — BP 163/86 | HR 66 | Temp 97.8°F | Resp 20

## 2021-08-28 DIAGNOSIS — J0100 Acute maxillary sinusitis, unspecified: Secondary | ICD-10-CM | POA: Diagnosis not present

## 2021-08-28 DIAGNOSIS — H65191 Other acute nonsuppurative otitis media, right ear: Secondary | ICD-10-CM

## 2021-08-28 DIAGNOSIS — I10 Essential (primary) hypertension: Secondary | ICD-10-CM | POA: Diagnosis not present

## 2021-08-28 MED ORDER — AMOXICILLIN 875 MG PO TABS: 875.0000 mg | ORAL_TABLET | Freq: Two times a day (BID) | ORAL | 0 refills | Status: AC

## 2021-08-28 NOTE — Discharge Instructions (Addendum)
Take the amoxicillin as directed.  Take plain Mucinex also.  Follow up with your primary care provider if your symptoms are not improving.    Your blood pressure is elevated today at 163/86.  Please have this rechecked by your primary care provider in 2-4 weeks.

## 2021-08-28 NOTE — ED Provider Notes (Addendum)
Carol [name]    CSN: [id number] Arrival date & time: 08/28/21  1039      History   Chief Complaint Chief Complaint  Patient presents with   Otalgia    APPT 1100    HPI Carol [name] is a 66 y.o. female.  Patient presents with 6-day history of fever, chills, congestion, maxillary sinus tenderness, cough.  She also has ear pain x2 days.  T-max 101 two days ago.  Treatment at home with OTC cold and flu medication.  She denies rash, shortness of breath, vomiting, diarrhea, or other symptoms.  Patient was seen at [facility] on 08/24/2021; diagnosed with viral URI and cough; COVID and flu negative.  Her medical history includes COPD, hypertension, MI, stroke, bipolar affective disorder  The history is provided by the patient and medical records.   Past Medical History:  Diagnosis Date   Bipolar affective disorder ([place])    Coronary artery disease    Hyperlipidemia    Hypertension    Hypothyroid    Myocardial infarction [facility])    Stroke ([place]) 11/15/2016   "right side weaker since" (12/20/2016)    Patient Active Problem List   Diagnosis Date Noted   Acute non-recurrent frontal sinusitis 01/24/2020   Seasonal allergic rhinitis due to pollen 01/24/2020   Acute upper respiratory infection 12/08/2019   Intractable vomiting 12/08/2019   S/P drug eluting coronary stent placement 04/13/2018   SVT (supraventricular tachycardia) ([place]) 04/10/2018   Hyperlipidemia, unspecified 12/01/2017   Choledocholithiasis with acute cholecystitis 12/19/2016   Coronary artery disease 12/19/2016   Essential hypertension 12/19/2016   Hypothyroidism 12/19/2016   History of stroke 12/19/2016   Migraine 06/17/2016   Cognitive deficit, post-stroke 05/18/2015   Chest pain 05/12/2015   Right facial numbness 03/30/2015   Lithium intoxication 02/27/2015   Bipolar disorder ([place]) 02/27/2015   CVA (cerebral infarction) 02/26/2015   Unstable angina ([place]) 02/03/2015   Tobacco use 01/19/2013    Temporomandibular joint disorder 01/02/2013   Tarsal tunnel syndrome 07/21/2012   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   Plantar fascial fibromatosis 07/21/2012   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    Past Surgical History:  Procedure Laterality Date   ABDOMINAL HYSTERECTOMY     APPENDECTOMY     CARDIAC CATHETERIZATION Left 09/26/2015   Procedure: Left Heart Cath and Coronary Angiography;  Surgeon: Teodoro Spray, MD;  Location: [place] [facility];  Service: Cardiovascular;  Laterality: Left;   CHOLECYSTECTOMY N/A 12/22/2016   Procedure: LAPAROSCOPIC CHOLECYSTECTOMY WITH INTRAOPERATIVE CHOLANGIOGRAM;  Surgeon: Jovita Kussmaul, MD;  Location: [place];  Service: General;  Laterality: N/A;   CORONARY STENT INTERVENTION N/A 04/13/2018   Procedure: CORONARY STENT INTERVENTION;  Surgeon: Yolonda Kida, MD;  Location: [place] [facility];  Service: Cardiovascular;  Laterality: N/A;   ERCP N/A 12/20/2016   Procedure: ENDOSCOPIC RETROGRADE CHOLANGIOPANCREATOGRAPHY (ERCP);  Surgeon: Teena Irani, MD;  Location: [facility] ENDOSCOPY;  Service: Endoscopy;  Laterality: N/A;   HYSTEROTOMY     LEFT HEART CATH AND CORONARY ANGIOGRAPHY Left 04/13/2018   Procedure: LEFT HEART CATH AND CORONARY ANGIOGRAPHY;  Surgeon: Teodoro Spray, MD;  Location: [place] [facility];  Service: Cardiovascular;  Laterality: Left;   LEFT HEART CATH AND CORONARY ANGIOGRAPHY Left 12/25/2019   Procedure: LEFT HEART CATH AND CORONARY ANGIOGRAPHY;  Surgeon: Teodoro Spray, MD;  Location: [place] [facility];  Service: Cardiovascular;  Laterality: Left;    OB History   No obstetric history on file.  Home Medications    Prior to Admission medications   Medication Sig Start Date End Date Taking? Authorizing Provider  acetaminophen (TYLENOL) 500 MG tablet Take 1,000 mg by mouth every 6 (six) hours as needed for mild pain or headache.   Yes [provider]  albuterol (PROVENTIL) (2.5  MG/3ML) 0.083% nebulizer solution USE 1 VIAL VIA NEBULIZER  EVERY 4 TO 6 HOURS AS  NEEDED Patient taking differently: Take 2.5 mg by nebulization every 4 (four) hours as needed for wheezing or shortness of breath. 09/07/17  Yes Lavera Guise, MD  amoxicillin (AMOXIL) 875 MG tablet Take 1 tablet (875 mg total) by mouth 2 (two) times daily for 7 days. 08/28/21 09/04/21 Yes Sharion Balloon, NP  Calcium Carbonate-Vitamin D 600-200 MG-UNIT CAPS Take 1 tablet by mouth daily.    Yes [provider]  citalopram (CELEXA) 40 MG tablet TAKE 1 TABLET BY MOUTH  DAILY 05/03/21  Yes Lavera Guise, MD  clopidogrel (PLAVIX) 75 MG tablet Take 75 mg by mouth daily.    Yes [provider]  diltiazem (CARDIZEM CD) 120 MG 24 hr capsule Take 120 mg by mouth daily.  01/16/19 08/28/21 Yes [provider]  diltiazem (CARDIZEM) 30 MG tablet Take 30 mg by mouth daily as needed (Heart palpitations).  10/25/19  Yes [provider]  fluticasone (FLONASE) 50 MCG/ACT nasal spray USE 1 SPRAY IN EACH NOSTRIL TWO TIMES DAILY Patient taking differently: Place 1 spray into both nostrils daily. 09/07/17  Yes Lavera Guise, MD  isosorbide mononitrate (IMDUR) 30 MG 24 hr tablet Take 30 mg by mouth daily.   Yes [provider]  levothyroxine (SYNTHROID) 100 MCG tablet TAKE 1 TABLET BY MOUTH  DAILY 04/22/21  Yes Lavera Guise, MD  lisinopril (PRINIVIL,ZESTRIL) 10 MG tablet Take 10 mg by mouth daily.   Yes [provider]  loratadine (CLARITIN) 10 MG tablet Take 10 mg by mouth daily.   Yes [provider]  lubiprostone (AMITIZA) 8 MCG capsule Take 8 mcg by mouth daily as needed for constipation.    Yes [provider]  metoprolol tartrate (LOPRESSOR) 25 MG tablet Take 12.5 mg by mouth daily.    Yes [provider]  nitroGLYCERIN (NITROSTAT) 0.4 MG SL tablet Place 0.4 mg under the tongue every 5 (five) minutes as needed for chest pain.   Yes [provider]   pantoprazole (PROTONIX) 40 MG tablet TAKE 1 TABLET BY MOUTH  DAILY 02/25/20  Yes Boscia, Heather E, NP  albuterol (PROVENTIL HFA;VENTOLIN HFA) 108 (90 BASE) MCG/ACT inhaler Inhale 2 puffs into the lungs every 6 (six) hours as needed for wheezing or shortness of breath.    [provider]  atorvastatin (LIPITOR) 10 MG tablet Take 10 mg by mouth daily. Take 1 tab po daily 04/11/19   [provider]  benzonatate (TESSALON) 200 MG capsule Take 1 capsule (200 mg total) by mouth 3 (three) times daily as needed for cough. 09/08/20   Coral Spikes, DO  ondansetron (ZOFRAN-ODT) 4 MG disintegrating tablet Take 1 tablet (4 mg total) by mouth every 8 (eight) hours as needed for nausea or vomiting. 11/27/19   Boscia, Heather E, NP  SYRINGE/NEEDLE, DISP, 1 ML 23G X 1" 1 ML MISC 1 each by Does not apply route once a week. 07/26/19   Kendell Bane, NP    Family History Family History  Problem Relation Age of Onset   CAD Other     Social  History Social History   Tobacco Use   Smoking status: Every Day    Packs/day: 0.50    Years: 45.00    Pack years: 22.50    Types: Cigarettes, E-cigarettes   Smokeless tobacco: Never   Tobacco comments:    was up to two packs a day  Substance Use Topics   Alcohol use: Not Currently    Comment: occasionally    Drug use: Yes    Comment: CBD gummies     Allergies   Morphine, Sulfa antibiotics, Sulfamethoxazole-trimethoprim, Azithromycin, Pregabalin, Asa [aspirin], and Rofecoxib   Review of Systems Review of Systems  Constitutional:  Positive for chills and fever.  HENT:  Positive for congestion, ear pain and sinus pressure. Negative for sore throat.   Respiratory:  Positive for cough. Negative for shortness of breath.   Cardiovascular:  Negative for chest pain and palpitations.  Gastrointestinal:  Negative for diarrhea and vomiting.  Skin:  Negative for color change and rash.  All other systems reviewed and are negative.   Physical  Exam Triage Vital Signs ED Triage Vitals  Enc Vitals Group     BP      Pulse      Resp      Temp      Temp src      SpO2      Weight      Height      Head Circumference      Peak Flow      Pain Score      Pain Loc      Pain Edu?      Excl. in [place]?    No data found.  Updated Vital Signs BP (!) 163/86 (BP Location: Left Arm)    Pulse 66    Temp 97.8 F (36.6 C) (Oral)    Resp 20    SpO2 96%   Visual Acuity Right Eye Distance:   Left Eye Distance:   Bilateral Distance:    Right Eye Near:   Left Eye Near:    Bilateral Near:     Physical Exam Vitals and nursing note reviewed.  Constitutional:      General: She is not in acute distress.    Appearance: She is well-developed.  HENT:     Right Ear: Ear canal normal. A middle ear effusion is present.     Left Ear: Tympanic membrane and ear canal normal.     Nose: Congestion present.     Mouth/Throat:     Mouth: Mucous membranes are moist.     Pharynx: Oropharynx is clear.  Cardiovascular:     Rate and Rhythm: Normal rate and regular rhythm.     Heart sounds: Normal heart sounds.  Pulmonary:     Effort: Pulmonary effort is normal. No respiratory distress.     Breath sounds: Normal breath sounds.  Abdominal:     Palpations: Abdomen is soft.  Musculoskeletal:     Cervical back: Neck supple.  Skin:    General: Skin is warm and dry.  Neurological:     Mental Status: She is alert.  Psychiatric:        Mood and Affect: Mood normal.        Behavior: Behavior normal.     UC Treatments / Results  Labs (all labs ordered are listed, but only abnormal results are displayed) Labs Reviewed  NOVEL CORONAVIRUS, NAA    EKG   Radiology No results found.  Procedures Procedures (including critical care  time)  Medications Ordered in UC Medications - No data to display  Initial Impression / Assessment and Plan / UC Course  I have reviewed the triage vital signs and the nursing notes.  Pertinent labs & imaging  results that were available during my care of the patient were reviewed by me and considered in my medical decision making (see chart for details).    Acute maxillary sinusitis, right ear effusion.  Elevated blood pressure with hypertension.  Treating with amoxicillin and plain Mucinex.  Instructed patient to follow-up with her PCP if her symptoms are not improving.  Also discussed that her blood pressure is elevated today and needs to be rechecked by her PCP in 2 to 4 weeks.  Education provided on managing hypertension.  Patient agrees to plan of care.  Final Clinical Impressions(s) / UC Diagnoses   Final diagnoses:  Acute non-recurrent maxillary sinusitis  Elevated blood pressure reading in office with diagnosis of hypertension  Acute effusion of right ear     Discharge Instructions      Take the amoxicillin as directed.  Take plain Mucinex also.  Follow up with your primary care provider if your symptoms are not improving.    Your blood pressure is elevated today at 163/86.  Please have this rechecked by your primary care provider in 2-4 weeks.          ED Prescriptions     Medication Sig Dispense Auth. Provider   amoxicillin (AMOXIL) 875 MG tablet Take 1 tablet (875 mg total) by mouth 2 (two) times daily for 7 days. 14 tablet Sharion Balloon, NP      PDMP not reviewed this encounter.   Sharion Balloon, NP 08/28/21 1133    Sharion Balloon, NP 08/28/21 1135

## 2021-08-28 NOTE — ED Triage Notes (Signed)
Pt presets c/o chest congestion, cough, chills x 6 days.  Dull aching R ear pain started two days ago.  Temp 101. Has taken OTC cold/flu meds.  Rapid COVID/Flu tests on  01/09 were negative (at [facility]).  Reports shallow breathing d/t coughing fits with deep breathing.

## 2021-08-29 LAB — SARS-COV-2, NAA 2 DAY TAT

## 2021-08-29 LAB — NOVEL CORONAVIRUS, NAA: SARS-CoV-2, NAA: DETECTED — AB

## 2021-10-13 ENCOUNTER — Other Ambulatory Visit: Payer: Self-pay | Admitting: Nurse Practitioner

## 2021-10-13 ENCOUNTER — Other Ambulatory Visit: Payer: Self-pay | Admitting: Internal Medicine

## 2021-10-13 NOTE — Telephone Encounter (Signed)
Pt needs follow up, not seen since august, will give one month supply of levothyroxine

## 2021-10-19 ENCOUNTER — Other Ambulatory Visit: Payer: Self-pay

## 2021-10-19 ENCOUNTER — Ambulatory Visit (INDEPENDENT_AMBULATORY_CARE_PROVIDER_SITE_OTHER): Payer: Medicare Other | Admitting: Nurse Practitioner

## 2021-10-19 ENCOUNTER — Encounter: Payer: Self-pay | Admitting: Nurse Practitioner

## 2021-10-19 VITALS — BP 134/88 | HR 95 | Temp 98.3°F | Resp 16 | Ht 60.0 in | Wt 134.8 lb

## 2021-10-19 DIAGNOSIS — E559 Vitamin D deficiency, unspecified: Secondary | ICD-10-CM | POA: Diagnosis not present

## 2021-10-19 DIAGNOSIS — E782 Mixed hyperlipidemia: Secondary | ICD-10-CM | POA: Diagnosis not present

## 2021-10-19 DIAGNOSIS — Z1231 Encounter for screening mammogram for malignant neoplasm of breast: Secondary | ICD-10-CM | POA: Diagnosis not present

## 2021-10-19 DIAGNOSIS — K219 Gastro-esophageal reflux disease without esophagitis: Secondary | ICD-10-CM | POA: Diagnosis not present

## 2021-10-19 DIAGNOSIS — I10 Essential (primary) hypertension: Secondary | ICD-10-CM

## 2021-10-19 DIAGNOSIS — J301 Allergic rhinitis due to pollen: Secondary | ICD-10-CM | POA: Diagnosis not present

## 2021-10-19 DIAGNOSIS — E039 Hypothyroidism, unspecified: Secondary | ICD-10-CM

## 2021-10-19 DIAGNOSIS — D649 Anemia, unspecified: Secondary | ICD-10-CM | POA: Diagnosis not present

## 2021-10-19 DIAGNOSIS — E538 Deficiency of other specified B group vitamins: Secondary | ICD-10-CM

## 2021-10-19 DIAGNOSIS — M8580 Other specified disorders of bone density and structure, unspecified site: Secondary | ICD-10-CM | POA: Diagnosis not present

## 2021-10-19 MED ORDER — NITROGLYCERIN 0.4 MG SL SUBL: 0.4000 mg | SUBLINGUAL_TABLET | SUBLINGUAL | 2 refills | Status: AC | PRN

## 2021-10-19 MED ORDER — LEVOTHYROXINE SODIUM 100 MCG PO TABS: 100.0000 ug | ORAL_TABLET | Freq: Every day | ORAL | 1 refills | Status: DC

## 2021-10-19 MED ORDER — ZOSTER VAC RECOMB ADJUVANTED 50 MCG/0.5ML IM SUSR: 0.5000 mL | Freq: Once | INTRAMUSCULAR | 0 refills | Status: DC

## 2021-10-19 MED ORDER — CETIRIZINE HCL 10 MG PO TABS: 10.0000 mg | ORAL_TABLET | Freq: Every day | ORAL | 1 refills | Status: DC

## 2021-10-19 MED ORDER — FLUTICASONE PROPIONATE 50 MCG/ACT NA SUSP: 1.0000 | Freq: Every day | NASAL | 5 refills | Status: DC

## 2021-10-19 MED ORDER — PNEUMOCOCCAL 20-VAL CONJ VACC 0.5 ML IM SUSY: 0.5000 mL | PREFILLED_SYRINGE | INTRAMUSCULAR | 0 refills | Status: DC

## 2021-10-19 MED ORDER — ISOSORBIDE MONONITRATE ER 30 MG PO TB24: 30.0000 mg | ORAL_TABLET | Freq: Every day | ORAL | 3 refills | Status: DC

## 2021-10-19 MED ORDER — ZOSTER VAC RECOMB ADJUVANTED 50 MCG/0.5ML IM SUSR: 0.5000 mL | Freq: Once | INTRAMUSCULAR | 0 refills | Status: AC

## 2021-10-19 MED ORDER — PANTOPRAZOLE SODIUM 40 MG PO TBEC: 40.0000 mg | DELAYED_RELEASE_TABLET | Freq: Two times a day (BID) | ORAL | 3 refills | Status: DC

## 2021-10-19 MED ORDER — LISINOPRIL 10 MG PO TABS: 10.0000 mg | ORAL_TABLET | Freq: Every day | ORAL | 3 refills | Status: DC

## 2021-10-19 MED ORDER — ATORVASTATIN CALCIUM 10 MG PO TABS: 10.0000 mg | ORAL_TABLET | Freq: Every day | ORAL | 3 refills | Status: DC

## 2021-10-19 MED ORDER — PNEUMOCOCCAL 20-VAL CONJ VACC 0.5 ML IM SUSY: 0.5000 mL | PREFILLED_SYRINGE | INTRAMUSCULAR | 0 refills | Status: AC

## 2021-10-19 MED ORDER — DILTIAZEM HCL ER COATED BEADS 120 MG PO CP24: 120.0000 mg | ORAL_CAPSULE | Freq: Every day | ORAL | 3 refills | Status: AC

## 2021-10-19 NOTE — Progress Notes (Cosign Needed)
[facility] [place], [place] [address]  Internal MEDICINE  Office Visit Note  Patient Name: Carol [name]  [id number]  [id number]  Date of Service: 10/19/2021  Chief Complaint  Patient presents with   Follow-up   Hyperlipidemia   Hypertension   seasonal allergies   Quality Metric Gaps    Shingles, Pneumonia, Dexa Scan and Mammogram    HPI Carol [name] presents for a follow-up visit for hypertension, seasonal allergies, hyperlipidemia and medication refills.  She has not been seen in the clinic in well over a year.  She is overdue for her annual physical and routine labs.  She is due for routine screening mammogram and BMD screening.  She is not due for colonoscopy until 2025.  She is interested in getting the shingles and pneumonia vaccines which will be sent to her local pharmacy.  She uses [facility] mail delivery pharmacy for all of her medications.  Her blood pressure is stable.  She does get some of her medications from her cardiologist. she reports having issues with low B12 in the past and is requesting to have B12 level added to her routine labs for her physical scheduled in April.  Her only specific concern for today is her seasonal allergies and needing refills specifically to help control her allergies.  She typically takes Flonase nasal spray and cetirizine 10 mg daily for her allergic rhinitis.    Current Medication: Outpatient Encounter Medications as of 10/19/2021  Medication Sig   acetaminophen (TYLENOL) 500 MG tablet Take 1,000 mg by mouth every 6 (six) hours as needed for mild pain or headache.   albuterol (PROVENTIL HFA;VENTOLIN HFA) 108 (90 BASE) MCG/ACT inhaler Inhale 2 puffs into the lungs every 6 (six) hours as needed for wheezing or shortness of breath.   albuterol (PROVENTIL) (2.5 MG/3ML) 0.083% nebulizer solution USE 1 VIAL VIA NEBULIZER  EVERY 4 TO 6 HOURS AS  NEEDED (Patient taking differently: Take 2.5 mg by nebulization every 4 (four) hours as  needed for wheezing or shortness of breath.)   Calcium Carbonate-Vitamin D 600-200 MG-UNIT CAPS Take 1 tablet by mouth daily.    cetirizine (ZYRTEC) 10 MG tablet Take 1 tablet (10 mg total) by mouth daily.   citalopram (CELEXA) 40 MG tablet TAKE 1 TABLET BY MOUTH  DAILY   clopidogrel (PLAVIX) 75 MG tablet Take 75 mg by mouth daily.    diltiazem (CARDIZEM) 30 MG tablet Take 30 mg by mouth daily as needed (Heart palpitations).    metoprolol tartrate (LOPRESSOR) 25 MG tablet Take 12.5 mg by mouth daily.    ondansetron (ZOFRAN-ODT) 4 MG disintegrating tablet Take 1 tablet (4 mg total) by mouth every 8 (eight) hours as needed for nausea or vomiting.   SYRINGE/NEEDLE, DISP, 1 ML 23G X 1" 1 ML MISC 1 each by Does not apply route once a week.   [DISCONTINUED] atorvastatin (LIPITOR) 10 MG tablet Take 10 mg by mouth daily. Take 1 tab po daily   [DISCONTINUED] benzonatate (TESSALON) 200 MG capsule Take 1 capsule (200 mg total) by mouth 3 (three) times daily as needed for cough.   [DISCONTINUED] fluticasone (FLONASE) 50 MCG/ACT nasal spray USE 1 SPRAY IN EACH NOSTRIL TWO TIMES DAILY (Patient taking differently: Place 1 spray into both nostrils daily.)   [DISCONTINUED] isosorbide mononitrate (IMDUR) 30 MG 24 hr tablet Take 30 mg by mouth daily.   [DISCONTINUED] levothyroxine (SYNTHROID) 100 MCG tablet TAKE 1 TABLET BY MOUTH  DAILY   [DISCONTINUED] lisinopril (PRINIVIL,ZESTRIL)  10 MG tablet Take 10 mg by mouth daily.   [DISCONTINUED] loratadine (CLARITIN) 10 MG tablet Take 10 mg by mouth daily.   [DISCONTINUED] lubiprostone (AMITIZA) 8 MCG capsule Take 8 mcg by mouth daily as needed for constipation.    [DISCONTINUED] nitroGLYCERIN (NITROSTAT) 0.4 MG SL tablet Place 0.4 mg under the tongue every 5 (five) minutes as needed for chest pain.   [DISCONTINUED] pantoprazole (PROTONIX) 40 MG tablet TAKE 1 TABLET BY MOUTH  DAILY   [DISCONTINUED] pneumococcal 20-valent conjugate vaccine (PREVNAR 20) 0.5 ML injection  Inject 0.5 mLs into the muscle tomorrow at 10 am for 1 dose.   [DISCONTINUED] Zoster Vaccine Adjuvanted [facility]) injection Inject 0.5 mLs into the muscle once for 1 dose.   atorvastatin (LIPITOR) 10 MG tablet Take 1 tablet (10 mg total) by mouth daily. Take 1 tab po daily   diltiazem (CARDIZEM CD) 120 MG 24 hr capsule Take 1 capsule (120 mg total) by mouth daily.   fluticasone (FLONASE) 50 MCG/ACT nasal spray Place 1 spray into both nostrils daily.   isosorbide mononitrate (IMDUR) 30 MG 24 hr tablet Take 1 tablet (30 mg total) by mouth daily.   levothyroxine (SYNTHROID) 100 MCG tablet Take 1 tablet (100 mcg total) by mouth daily.   lisinopril (ZESTRIL) 10 MG tablet Take 1 tablet (10 mg total) by mouth daily.   nitroGLYCERIN (NITROSTAT) 0.4 MG SL tablet Place 1 tablet (0.4 mg total) under the tongue every 5 (five) minutes as needed for chest pain.   pantoprazole (PROTONIX) 40 MG tablet Take 1 tablet (40 mg total) by mouth 2 (two) times daily.   [DISCONTINUED] diltiazem (CARDIZEM CD) 120 MG 24 hr capsule Take 120 mg by mouth daily.    [DISCONTINUED] sodium chloride flush (NS) 0.9 % injection 3 mL    No facility-administered encounter medications on file as of 10/19/2021.    Surgical History: Past Surgical History:  Procedure Laterality Date   ABDOMINAL HYSTERECTOMY     APPENDECTOMY     CARDIAC CATHETERIZATION Left 09/26/2015   Procedure: Left Heart Cath and Coronary Angiography;  Surgeon: Teodoro Spray, MD;  Location: [facility];  Service: Cardiovascular;  Laterality: Left;   CHOLECYSTECTOMY N/A 12/22/2016   Procedure: LAPAROSCOPIC CHOLECYSTECTOMY WITH INTRAOPERATIVE CHOLANGIOGRAM;  Surgeon: Jovita Kussmaul, MD;  Location: [facility];  Service: General;  Laterality: N/A;   CORONARY STENT INTERVENTION N/A 04/13/2018   Procedure: CORONARY STENT INTERVENTION;  Surgeon: Yolonda Kida, MD;  Location: [place] [facility];  Service: Cardiovascular;  Laterality: N/A;   ERCP N/A 12/20/2016    Procedure: ENDOSCOPIC RETROGRADE CHOLANGIOPANCREATOGRAPHY (ERCP);  Surgeon: Teena Irani, MD;  Location: [facility] ENDOSCOPY;  Service: Endoscopy;  Laterality: N/A;   HYSTEROTOMY     LEFT HEART CATH AND CORONARY ANGIOGRAPHY Left 04/13/2018   Procedure: LEFT HEART CATH AND CORONARY ANGIOGRAPHY;  Surgeon: Teodoro Spray, MD;  Location: [place] [facility];  Service: Cardiovascular;  Laterality: Left;   LEFT HEART CATH AND CORONARY ANGIOGRAPHY Left 12/25/2019   Procedure: LEFT HEART CATH AND CORONARY ANGIOGRAPHY;  Surgeon: Teodoro Spray, MD;  Location: [place] [facility];  Service: Cardiovascular;  Laterality: Left;    Medical History: Past Medical History:  Diagnosis Date   Bipolar affective disorder ([place])    Coronary artery disease    Hyperlipidemia    Hypertension    Hypothyroid    Myocardial infarction [facility])    Stroke ([place]) 11/15/2016   "right side weaker since" (12/20/2016)    Family History:  Family History  Problem Relation Age of Onset   CAD Other     Social History   Socioeconomic History   Marital status: Married    Spouse name: Not on file   Number of children: Not on file   Years of education: Not on file   Highest education level: Not on file  Occupational History   Not on file  Tobacco Use   Smoking status: Every Day    Packs/day: 0.50    Years: 45.00    Pack years: 22.50    Types: Cigarettes, E-cigarettes   Smokeless tobacco: Never   Tobacco comments:    was up to two packs a day  Substance and Sexual Activity   Alcohol use: Not Currently    Comment: occasionally    Drug use: Yes    Comment: CBD gummies   Sexual activity: Yes  Other Topics Concern   Not on file  Social History Narrative   Not on file   Social Determinants of Health   Financial Resource Strain: Not on file  Food Insecurity: Not on file  Transportation Needs: Not on file  Physical Activity: Not on file  Stress: Not on file  Social Connections: Not on file  Intimate Partner Violence:  Not on file      Review of Systems  Constitutional:  Negative for chills, fatigue and unexpected weight change.  HENT:  Positive for congestion, postnasal drip, rhinorrhea, sneezing and sore throat.   Eyes:  Negative for redness.  Respiratory: Negative.  Negative for cough, chest tightness, shortness of breath and wheezing.   Cardiovascular: Negative.  Negative for chest pain and palpitations.  Gastrointestinal:  Negative for abdominal pain, constipation, diarrhea, nausea and vomiting.  Genitourinary:  Negative for dysuria and frequency.  Musculoskeletal:  Negative for arthralgias, back pain, joint swelling and neck pain.  Skin:  Negative for rash.  Neurological: Negative.  Negative for tremors and numbness.  Hematological:  Negative for adenopathy. Does not bruise/bleed easily.  Psychiatric/Behavioral:  Negative for behavioral problems (Depression), self-injury, sleep disturbance and suicidal ideas. The patient is not nervous/anxious.    Vital Signs: BP 134/88    Pulse 95    Temp 98.3 F (36.8 C)    Resp 16    Ht 5' (1.524 m)    Wt 134 lb 12.8 oz (61.1 kg)    SpO2 99%    BMI 26.33 kg/m    Physical Exam Vitals reviewed.  Constitutional:      General: She is not in acute distress.    Appearance: Normal appearance. She is not ill-appearing.  HENT:     Head: Normocephalic and atraumatic.  Eyes:     Pupils: Pupils are equal, round, and reactive to light.  Cardiovascular:     Rate and Rhythm: Normal rate and regular rhythm.  Pulmonary:     Effort: Pulmonary effort is normal. No respiratory distress.  Neurological:     Mental Status: She is alert and oriented to person, place, and time.  Psychiatric:        Mood and Affect: Mood normal.        Behavior: Behavior normal.       Assessment/Plan: labs ordered for upcoming annual physical exam and medication refills done today.  1. Mixed hyperlipidemia Atorvastatin refills ordered. Routine labs ordered.  - atorvastatin  (LIPITOR) 10 MG tablet; Take 1 tablet (10 mg total) by mouth daily. Take 1 tab po daily  Dispense: 90 tablet; Refill: 3 - CMP14+EGFR - Lipid  Profile  2. Seasonal allergic rhinitis due to pollen Ceterizine ordered. Flonase refilled.  - fluticasone (FLONASE) 50 MCG/ACT nasal spray; Place 1 spray into both nostrils daily.  Dispense: 16 g; Refill: 5 - cetirizine (ZYRTEC) 10 MG tablet; Take 1 tablet (10 mg total) by mouth daily.  Dispense: 90 tablet; Refill: 1 - CMP14+EGFR  3. Essential hypertension Continue medications as prescribed. Cardizem and lisinopril. Routine labs ordered.  - lisinopril (ZESTRIL) 10 MG tablet; Take 1 tablet (10 mg total) by mouth daily.  Dispense: 90 tablet; Refill: 3 - diltiazem (CARDIZEM CD) 120 MG 24 hr capsule; Take 1 capsule (120 mg total) by mouth daily.  Dispense: 90 capsule; Refill: 3 - CMP14+EGFR  4. Acquired hypothyroidism Levothyroxine ordered as well as routine labs ordered.  - levothyroxine (SYNTHROID) 100 MCG tablet; Take 1 tablet (100 mcg total) by mouth daily.  Dispense: 30 tablet; Refill: 1 - CMP14+EGFR - TSH + free T4  5. B12 deficiency Routine labs ordered.  - CMP14+EGFR - B12 and Folate Panel  6. Anemia, unspecified type Routine labs ordered.  - CBC with Differential/Platelet - CMP14+EGFR - B12 and Folate Panel - Iron, TIBC and Ferritin Panel  7. Vitamin D deficiency Routine labs ordered.  - CMP14+EGFR - Vitamin D (25 hydroxy)  8. Encounter for screening mammogram for malignant neoplasm of breast Routine mammogram ordered. - MM 3D SCREEN BREAST BILATERAL; Future  9. Osteopenia, unspecified location Dexa scan ordered, patient will call to schedule - DG Bone Density; Future  10. Gastroesophageal reflux disease without esophagitis Refills ordered.  - pantoprazole (PROTONIX) 40 MG tablet; Take 1 tablet (40 mg total) by mouth 2 (two) times daily.  Dispense: 180 tablet; Refill: 3   General Counseling: Ambur verbalizes understanding  of the findings of todays visit and agrees with plan of treatment. I have discussed any further diagnostic evaluation that may be needed or ordered today. We also reviewed her medications today. she has been encouraged to call the office with any questions or concerns that should arise related to todays visit.    Orders Placed This Encounter  Procedures   MM 3D SCREEN BREAST BILATERAL   DG Bone Density   CBC with Differential/Platelet   CMP14+EGFR   Lipid Profile   TSH + free T4   Vitamin D (25 hydroxy)   B12 and Folate Panel   Iron, TIBC and Ferritin Panel    Meds ordered this encounter  Medications   fluticasone (FLONASE) 50 MCG/ACT nasal spray    Sig: Place 1 spray into both nostrils daily.    Dispense:  16 g    Refill:  5   pantoprazole (PROTONIX) 40 MG tablet    Sig: Take 1 tablet (40 mg total) by mouth 2 (two) times daily.    Dispense:  180 tablet    Refill:  3    Requesting 1 year supply   cetirizine (ZYRTEC) 10 MG tablet    Sig: Take 1 tablet (10 mg total) by mouth daily.    Dispense:  90 tablet    Refill:  1   levothyroxine (SYNTHROID) 100 MCG tablet    Sig: Take 1 tablet (100 mcg total) by mouth daily.    Dispense:  30 tablet    Refill:  1    Pt needs appt for further refills   nitroGLYCERIN (NITROSTAT) 0.4 MG SL tablet    Sig: Place 1 tablet (0.4 mg total) under the tongue every 5 (five) minutes as needed for chest pain.  Dispense:  30 tablet    Refill:  2   lisinopril (ZESTRIL) 10 MG tablet    Sig: Take 1 tablet (10 mg total) by mouth daily.    Dispense:  90 tablet    Refill:  3   atorvastatin (LIPITOR) 10 MG tablet    Sig: Take 1 tablet (10 mg total) by mouth daily. Take 1 tab po daily    Dispense:  90 tablet    Refill:  3   diltiazem (CARDIZEM CD) 120 MG 24 hr capsule    Sig: Take 1 capsule (120 mg total) by mouth daily.    Dispense:  90 capsule    Refill:  3   isosorbide mononitrate (IMDUR) 30 MG 24 hr tablet    Sig: Take 1 tablet (30 mg total)  by mouth daily.    Dispense:  90 tablet    Refill:  3   DISCONTD: Zoster Vaccine Adjuvanted [facility]) injection    Sig: Inject 0.5 mLs into the muscle once for 1 dose.    Dispense:  0.5 mL    Refill:  0   DISCONTD: pneumococcal 20-valent conjugate vaccine (PREVNAR 20) 0.5 ML injection    Sig: Inject 0.5 mLs into the muscle tomorrow at 10 am for 1 dose.    Dispense:  0.5 mL    Refill:  0    Return in 30 days (on 11/18/2021) for previously scheduled, CPE,  PCP.   Total time spent:30 Minutes Time spent includes review of chart, medications, test results, and follow up plan with the patient.   [place] Controlled Substance Database was reviewed by me.  This patient was seen by Jonetta Osgood, FNP-C in collaboration with Dr. Clayborn Bigness as a part of collaborative care agreement.    R. Valetta Fuller, MSN, FNP-C Internal medicine

## 2021-11-18 ENCOUNTER — Ambulatory Visit: Payer: Medicare Other | Admitting: Nurse Practitioner

## 2021-12-09 ENCOUNTER — Ambulatory Visit: Payer: Medicare Other | Admitting: Nurse Practitioner

## 2021-12-14 ENCOUNTER — Other Ambulatory Visit: Payer: Self-pay | Admitting: Nurse Practitioner

## 2021-12-14 DIAGNOSIS — E039 Hypothyroidism, unspecified: Secondary | ICD-10-CM

## 2021-12-20 ENCOUNTER — Ambulatory Visit
Admission: EM | Admit: 2021-12-20 | Discharge: 2021-12-20 | Disposition: A | Discharge status: Home / Self Care | Payer: Medicare Other

## 2021-12-20 ENCOUNTER — Encounter: Payer: Self-pay | Admitting: Emergency Medicine

## 2021-12-20 DIAGNOSIS — R21 Rash and other nonspecific skin eruption: Secondary | ICD-10-CM

## 2021-12-20 DIAGNOSIS — S70261A Insect bite (nonvenomous), right hip, initial encounter: Secondary | ICD-10-CM

## 2021-12-20 DIAGNOSIS — W57XXXA Bitten or stung by nonvenomous insect and other nonvenomous arthropods, initial encounter: Secondary | ICD-10-CM | POA: Diagnosis not present

## 2021-12-20 MED ORDER — DOXYCYCLINE HYCLATE 100 MG PO CAPS: 100.0000 mg | ORAL_CAPSULE | Freq: Two times a day (BID) | ORAL | 0 refills | Status: AC

## 2021-12-20 NOTE — ED Triage Notes (Signed)
Pt removed a tick from her thigh 3-4 days. Pt noticed a rash at the site today. ?

## 2021-12-20 NOTE — ED Provider Notes (Signed)
?[facility] ? ? ? ?CSN: [id number] ?Arrival date & time: 12/20/21  1317 ? ? ?  ? ?History   ?Chief Complaint ?Chief Complaint  ?Patient presents with  ? Tick Bite   ? Rash  ? ? ?HPI ?Carol [name] is a 66 y.o. female.  Patient presents with a tick bite on her right hip with rash.  She removed the tick 4 days ago but does not know how long it was attached.  She developed the target-like rash around the tick bite and it is getting bigger.  She denies fever or other symptoms.  No treatments at home.  Her medical history includes seasonal allergies, hypertension, CVA, MI, angina, migraine headaches, neuropathy. ? ?The history is provided by the patient and medical records.  ? ?Past Medical History:  ?Diagnosis Date  ? Bipolar affective disorder ([place])   ? Coronary artery disease   ? Hyperlipidemia   ? Hypertension   ? Hypothyroid   ? Myocardial infarction [facility])   ? Stroke [facility]) 11/15/2016  ? "right side weaker since" (12/20/2016)  ? ? ?Patient Active Problem List  ? Diagnosis Date Noted  ? Acute non-recurrent frontal sinusitis 01/24/2020  ? Seasonal allergic rhinitis due to pollen 01/24/2020  ? Acute upper respiratory infection 12/08/2019  ? Intractable vomiting 12/08/2019  ? S/P drug eluting coronary stent placement 04/13/2018  ? SVT (supraventricular tachycardia) ([place]) 04/10/2018  ? Hyperlipidemia, unspecified 12/01/2017  ? Choledocholithiasis with acute cholecystitis 12/19/2016  ? Coronary artery disease 12/19/2016  ? Essential hypertension 12/19/2016  ? Hypothyroidism 12/19/2016  ? History of stroke 12/19/2016  ? Migraine 06/17/2016  ? Cognitive deficit, post-stroke 05/18/2015  ? Chest pain 05/12/2015  ? Right facial numbness 03/30/2015  ? Lithium intoxication 02/27/2015  ? Bipolar disorder ([place]) 02/27/2015  ? CVA (cerebral infarction) 02/26/2015  ? Unstable angina ([place]) 02/03/2015  ? Tobacco use 01/19/2013  ? Temporomandibular joint disorder 01/02/2013  ? Tarsal tunnel syndrome 07/21/2012  ? 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 Plantar fascial fibromatosis 07/21/2012  ? 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 ? ?Past Surgical History:  ?Procedure Laterality Date  ? ABDOMINAL HYSTERECTOMY    ? APPENDECTOMY    ? CARDIAC CATHETERIZATION Left 09/26/2015  ? Procedure: Left Heart Cath and Coronary Angiography;  Surgeon: Teodoro Spray, MD;  Location: [place] [facility];  Service: Cardiovascular;  Laterality: Left;  ? CHOLECYSTECTOMY N/A 12/22/2016  ? Procedure: LAPAROSCOPIC CHOLECYSTECTOMY WITH INTRAOPERATIVE CHOLANGIOGRAM;  Surgeon: Jovita Kussmaul, MD;  Location: [place];  Service: General;  Laterality: N/A;  ? CORONARY STENT INTERVENTION N/A 04/13/2018  ? Procedure: CORONARY STENT INTERVENTION;  Surgeon: Yolonda Kida, MD;  Location: [place] [facility];  Service: Cardiovascular;  Laterality: N/A;  ? ERCP N/A 12/20/2016  ? Procedure: ENDOSCOPIC RETROGRADE CHOLANGIOPANCREATOGRAPHY (ERCP);  Surgeon: Teena Irani, MD;  Location: [facility];  Service: Endoscopy;  Laterality: N/A;  ? HYSTEROTOMY    ? LEFT HEART CATH AND CORONARY ANGIOGRAPHY Left 04/13/2018  ? Procedure: LEFT HEART CATH AND CORONARY ANGIOGRAPHY;  Surgeon: Teodoro Spray, MD;  Location: [place] [facility];  Service: Cardiovascular;  Laterality: Left;  ? LEFT HEART CATH AND CORONARY ANGIOGRAPHY Left 12/25/2019  ? Procedure: LEFT HEART CATH AND CORONARY ANGIOGRAPHY;  Surgeon: Teodoro Spray, MD;  Location: [place] [facility];  Service: Cardiovascular;  Laterality: Left;  ? ? ?OB History   ?No obstetric history on file. ?  ? ? ? ?Home Medications   ? ?  Prior to Admission medications   ?Medication Sig Start Date End Date Taking? Authorizing Provider  ?clopidogrel (PLAVIX) 75 MG tablet Take 1 tablet by mouth daily. 06/19/21  Yes [provider]  ?doxycycline (VIBRAMYCIN) 100 MG capsule Take 1 capsule (100 mg total) by mouth 2 (two) times daily for 10 days. 12/20/21 12/30/21 Yes Sharion Balloon, NP  ?acetaminophen (TYLENOL) 500  MG tablet Take 1,000 mg by mouth every 6 (six) hours as needed for mild pain or headache.    [provider]  ?albuterol (PROVENTIL HFA;VENTOLIN HFA) 108 (90 BASE) MCG/ACT inhaler Inhale 2 puffs into the lungs every 6 (six) hours as needed for wheezing or shortness of breath.    [provider]  ?albuterol (PROVENTIL) (2.5 MG/3ML) 0.083% nebulizer solution USE 1 VIAL VIA NEBULIZER  EVERY 4 TO 6 HOURS AS  NEEDED ?Patient taking differently: Take 2.5 mg by nebulization every 4 (four) hours as needed for wheezing or shortness of breath. 09/07/17   Lavera Guise, MD  ?atorvastatin (LIPITOR) 10 MG tablet Take 1 tablet (10 mg total) by mouth daily. Take 1 tab po daily 10/19/21   Jonetta Osgood, NP  ?Calcium Carbonate-Vitamin D 600-200 MG-UNIT CAPS Take 1 tablet by mouth daily.     [provider]  ?cetirizine (ZYRTEC) 10 MG tablet Take 1 tablet (10 mg total) by mouth daily. 10/19/21   Jonetta Osgood, NP  ?citalopram (CELEXA) 40 MG tablet TAKE 1 TABLET BY MOUTH  DAILY 05/03/21   Lavera Guise, MD  ?clopidogrel (PLAVIX) 75 MG tablet Take 75 mg by mouth daily.     [provider]  ?diltiazem (CARDIZEM CD) 120 MG 24 hr capsule Take 1 capsule (120 mg total) by mouth daily. 10/19/21   Jonetta Osgood, NP  ?diltiazem (CARDIZEM) 30 MG tablet Take 30 mg by mouth daily as needed (Heart palpitations).  10/25/19   [provider]  ?fluticasone (FLONASE) 50 MCG/ACT nasal spray Place 1 spray into both nostrils daily. 10/19/21   Jonetta Osgood, NP  ?isosorbide mononitrate (IMDUR) 30 MG 24 hr tablet Take 1 tablet (30 mg total) by mouth daily. 10/19/21   Jonetta Osgood, NP  ?levothyroxine (SYNTHROID) 100 MCG tablet TAKE 1 TABLET BY MOUTH DAILY 12/14/21   Lavera Guise, MD  ?lisinopril (ZESTRIL) 10 MG tablet Take 1 tablet (10 mg total) by mouth daily. 10/19/21   Jonetta Osgood, NP  ?metoprolol tartrate (LOPRESSOR) 25 MG tablet Take 12.5 mg by mouth daily.     [provider]   ?nitroGLYCERIN (NITROSTAT) 0.4 MG SL tablet Place 1 tablet (0.4 mg total) under the tongue every 5 (five) minutes as needed for chest pain. 10/19/21   Jonetta Osgood, NP  ?ondansetron (ZOFRAN-ODT) 4 MG disintegrating tablet Take 1 tablet (4 mg total) by mouth every 8 (eight) hours as needed for nausea or vomiting. 11/27/19   Ronnell Freshwater, NP  ?pantoprazole (PROTONIX) 40 MG tablet Take 1 tablet (40 mg total) by mouth 2 (two) times daily. 10/19/21   Jonetta Osgood, NP  ?rosuvastatin (CRESTOR) 10 MG tablet Take 10 mg by mouth at bedtime. 08/10/21   [provider]  ?SYRINGE/NEEDLE, DISP, 1 ML 23G X 1" 1 ML MISC 1 each by Does not apply route once a week. 07/26/19   Kendell Bane, NP  ? ? ?Family History ?Family History  ?Problem Relation Age of Onset  ? CAD Other   ? ? ?Social History ?Social History  ? ?Tobacco Use  ? Smoking status: Every Day  ?  Packs/day: 0.50  ?  Years: 45.00  ?  Pack years: 22.50  ?  Types: Cigarettes  ? Smokeless tobacco: Never  ? Tobacco comments:  ?  was up to two packs a day  ?Vaping Use  ? Vaping Use: Never used  ?Substance Use Topics  ? Alcohol use: Not Currently  ?  Comment: occasionally   ? Drug use: Yes  ?  Comment: CBD gummies  ? ? ? ?Allergies   ?Morphine, Sulfa antibiotics, Sulfamethoxazole-trimethoprim, Azithromycin, Pregabalin, Asa [aspirin], and Rofecoxib ? ? ?Review of Systems ?Review of Systems  ?Constitutional:  Negative for chills and fever.  ?Respiratory:  Negative for cough and shortness of breath.   ?Cardiovascular:  Negative for chest pain and palpitations.  ?Gastrointestinal:  Negative for abdominal pain, diarrhea and vomiting.  ?Musculoskeletal:  Negative for arthralgias and joint swelling.  ?Skin:  Positive for color change and rash.  ?All other systems reviewed and are negative. ? ? ?Physical Exam ?Triage Vital Signs ?ED Triage Vitals  ?Enc Vitals Group  ?   BP 12/20/21 1402 120/69  ?   Pulse Rate 12/20/21 1402 (!) 59  ?   Resp 12/20/21 1402 18  ?    Temp 12/20/21 1402 98.6 ?F (37 ?C)  ?   Temp src --   ?   SpO2 12/20/21 1402 97 %  ?   Weight --   ?   Height --   ?   Head Circumference --   ?   Peak Flow --   ?   Pain Score 12/20/21 1413 0  ?   Pain Loc --

## 2021-12-20 NOTE — Discharge Instructions (Addendum)
Your tick bite lab work is pending.  Take the doxycycline as directed.  Follow up with your primary care provider in 2-3 days.   ? ?

## 2021-12-22 LAB — EHRLICHIA ANTIBODY PANEL
E. Chaffeensis (HME) IgM Titer: NEGATIVE
E.Chaffeensis (HME) IgG: NEGATIVE
HGE IgG Titer: NEGATIVE
HGE IgM Titer: NEGATIVE

## 2021-12-22 LAB — LYME DISEASE SEROLOGY W/REFLEX: Lyme Total Antibody EIA: NEGATIVE

## 2021-12-22 LAB — ROCKY MTN SPOTTED FVR ABS PNL(IGG+IGM)
RMSF IgG: NEGATIVE
RMSF IgM: 0.4 index (ref 0.00–0.89)

## 2022-02-04 DIAGNOSIS — H40003 Preglaucoma, unspecified, bilateral: Secondary | ICD-10-CM | POA: Diagnosis not present

## 2022-02-18 ENCOUNTER — Ambulatory Visit
Admission: RE | Admit: 2022-02-18 | Discharge: 2022-02-18 | Disposition: A | Discharge status: Home / Self Care | Payer: Medicare Other | Source: Ambulatory Visit | Attending: Nurse Practitioner | Admitting: Nurse Practitioner

## 2022-02-18 VITALS — BP 134/73 | HR 71 | Temp 98.3°F | Resp 18

## 2022-02-18 DIAGNOSIS — H9201 Otalgia, right ear: Secondary | ICD-10-CM | POA: Diagnosis not present

## 2022-02-18 NOTE — ED Provider Notes (Signed)
Roderic Palau    CSN: [id number] Arrival date & time: 02/18/22  1709      History   Chief Complaint Chief Complaint  Patient presents with   Ear Drainage    Infection - Entered by patient    HPI Carol [name] is a 66 y.o. female.  Patient presents with right ear and drainage x2 days.  No fever, sore throat, unusual cough, shortness of breath, vomiting, diarrhea, or other symptoms.  Treatment at home with Tylenol.  Her medical history includes allergies, hypertension, CVA, polyneuropathy, migraine headaches, bipolar disorder.  The history is provided by the patient and medical records.    Past Medical History:  Diagnosis Date   Bipolar affective disorder ([place])    Coronary artery disease    Hyperlipidemia    Hypertension    Hypothyroid    Myocardial infarction [facility])    Stroke ([place]) 11/15/2016   "right side weaker since" (12/20/2016)    Patient Active Problem List   Diagnosis Date Noted   Acute non-recurrent frontal sinusitis 01/24/2020   Seasonal allergic rhinitis due to pollen 01/24/2020   Acute upper respiratory infection 12/08/2019   Intractable vomiting 12/08/2019   S/P drug eluting coronary stent placement 04/13/2018   SVT (supraventricular tachycardia) ([place]) 04/10/2018   Hyperlipidemia, unspecified 12/01/2017   Choledocholithiasis with acute cholecystitis 12/19/2016   Coronary artery disease 12/19/2016   Essential hypertension 12/19/2016   Hypothyroidism 12/19/2016   History of stroke 12/19/2016   Migraine 06/17/2016   Cognitive deficit, post-stroke 05/18/2015   Chest pain 05/12/2015   Right facial numbness 03/30/2015   Lithium intoxication 02/27/2015   Bipolar disorder ([place]) 02/27/2015   CVA (cerebral infarction) 02/26/2015   Unstable angina ([place]) 02/03/2015   Tobacco use 01/19/2013   Temporomandibular joint disorder 01/02/2013   Tarsal tunnel syndrome 07/21/2012   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   Plantar fascial  fibromatosis 07/21/2012   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    Past Surgical History:  Procedure Laterality Date   ABDOMINAL HYSTERECTOMY     APPENDECTOMY     CARDIAC CATHETERIZATION Left 09/26/2015   Procedure: Left Heart Cath and Coronary Angiography;  Surgeon: Teodoro Spray, MD;  Location: [place] [facility];  Service: Cardiovascular;  Laterality: Left;   CHOLECYSTECTOMY N/A 12/22/2016   Procedure: LAPAROSCOPIC CHOLECYSTECTOMY WITH INTRAOPERATIVE CHOLANGIOGRAM;  Surgeon: Jovita Kussmaul, MD;  Location: [place];  Service: General;  Laterality: N/A;   CORONARY STENT INTERVENTION N/A 04/13/2018   Procedure: CORONARY STENT INTERVENTION;  Surgeon: Yolonda Kida, MD;  Location: [place] [facility];  Service: Cardiovascular;  Laterality: N/A;   ERCP N/A 12/20/2016   Procedure: ENDOSCOPIC RETROGRADE CHOLANGIOPANCREATOGRAPHY (ERCP);  Surgeon: Teena Irani, MD;  Location: [facility] ENDOSCOPY;  Service: Endoscopy;  Laterality: N/A;   HYSTEROTOMY     LEFT HEART CATH AND CORONARY ANGIOGRAPHY Left 04/13/2018   Procedure: LEFT HEART CATH AND CORONARY ANGIOGRAPHY;  Surgeon: Teodoro Spray, MD;  Location: [place] [facility];  Service: Cardiovascular;  Laterality: Left;   LEFT HEART CATH AND CORONARY ANGIOGRAPHY Left 12/25/2019   Procedure: LEFT HEART CATH AND CORONARY ANGIOGRAPHY;  Surgeon: Teodoro Spray, MD;  Location: [place] [facility];  Service: Cardiovascular;  Laterality: Left;    OB History   No obstetric history on file.      Home Medications    Prior to Admission medications   Medication Sig Start Date End Date Taking? Authorizing Provider  acetaminophen (TYLENOL) 500 MG tablet  Take 1,000 mg by mouth every 6 (six) hours as needed for mild pain or headache.    [provider]  albuterol (PROVENTIL HFA;VENTOLIN HFA) 108 (90 BASE) MCG/ACT inhaler Inhale 2 puffs into the lungs every 6 (six) hours as needed for wheezing or shortness of breath.    [provider]  albuterol (PROVENTIL) (2.5 MG/3ML) 0.083% nebulizer solution USE 1 VIAL VIA NEBULIZER  EVERY 4 TO 6 HOURS AS  NEEDED Patient taking differently: Take 2.5 mg by nebulization every 4 (four) hours as needed for wheezing or shortness of breath. 09/07/17   Lavera Guise, MD  atorvastatin (LIPITOR) 10 MG tablet Take 1 tablet (10 mg total) by mouth daily. Take 1 tab po daily 10/19/21   Jonetta Osgood, NP  Calcium Carbonate-Vitamin D 600-200 MG-UNIT CAPS Take 1 tablet by mouth daily.     [provider]  cetirizine (ZYRTEC) 10 MG tablet Take 1 tablet (10 mg total) by mouth daily. 10/19/21   Jonetta Osgood, NP  citalopram (CELEXA) 40 MG tablet TAKE 1 TABLET BY MOUTH  DAILY 05/03/21   Lavera Guise, MD  clopidogrel (PLAVIX) 75 MG tablet Take 75 mg by mouth daily.     [provider]  clopidogrel (PLAVIX) 75 MG tablet Take 1 tablet by mouth daily. 06/19/21   [provider]  diltiazem (CARDIZEM CD) 120 MG 24 hr capsule Take 1 capsule (120 mg total) by mouth daily. 10/19/21   Jonetta Osgood, NP  diltiazem (CARDIZEM) 30 MG tablet Take 30 mg by mouth daily as needed (Heart palpitations).  10/25/19   [provider]  fluticasone (FLONASE) 50 MCG/ACT nasal spray Place 1 spray into both nostrils daily. 10/19/21   Jonetta Osgood, NP  isosorbide mononitrate (IMDUR) 30 MG 24 hr tablet Take 1 tablet (30 mg total) by mouth daily. 10/19/21   Jonetta Osgood, NP  levothyroxine (SYNTHROID) 100 MCG tablet TAKE 1 TABLET BY MOUTH DAILY 12/14/21   Lavera Guise, MD  lisinopril (ZESTRIL) 10 MG tablet Take 1 tablet (10 mg total) by mouth daily. 10/19/21   Jonetta Osgood, NP  metoprolol tartrate (LOPRESSOR) 25 MG tablet Take 12.5 mg by mouth daily.     [provider]  nitroGLYCERIN (NITROSTAT) 0.4 MG SL tablet Place 1 tablet (0.4 mg total) under the tongue every 5 (five) minutes as needed for chest pain. 10/19/21   Jonetta Osgood, NP  ondansetron (ZOFRAN-ODT) 4 MG disintegrating  tablet Take 1 tablet (4 mg total) by mouth every 8 (eight) hours as needed for nausea or vomiting. 11/27/19   Ronnell Freshwater, NP  pantoprazole (PROTONIX) 40 MG tablet Take 1 tablet (40 mg total) by mouth 2 (two) times daily. 10/19/21   Jonetta Osgood, NP  rosuvastatin (CRESTOR) 10 MG tablet Take 10 mg by mouth at bedtime. 08/10/21   [provider]  SYRINGE/NEEDLE, DISP, 1 ML 23G X 1" 1 ML MISC 1 each by Does not apply route once a week. 07/26/19   Kendell Bane, NP    Family History Family History  Problem Relation Age of Onset   CAD Other     Social History Social History   Tobacco Use   Smoking status: Every Day    Packs/day: 0.50    Years: 45.00    Total pack years: 22.50    Types: Cigarettes   Smokeless tobacco: Never   Tobacco comments:    was up to two packs a day  Vaping Use   Vaping Use: Never  used  Substance Use Topics   Alcohol use: Not Currently    Comment: occasionally    Drug use: Yes    Comment: CBD gummies     Allergies   Morphine, Sulfa antibiotics, Sulfamethoxazole-trimethoprim, Azithromycin, Pregabalin, Asa [aspirin], and Rofecoxib   Review of Systems Review of Systems  Constitutional:  Negative for chills and fever.  HENT:  Positive for ear discharge and ear pain. Negative for sore throat.   Respiratory:  Negative for cough and shortness of breath.   Gastrointestinal:  Negative for diarrhea and vomiting.  Skin:  Negative for color change and rash.  All other systems reviewed and are negative.    Physical Exam Triage Vital Signs ED Triage Vitals  Enc Vitals Group     BP      Pulse      Resp      Temp      Temp src      SpO2      Weight      Height      Head Circumference      Peak Flow      Pain Score      Pain Loc      Pain Edu?      Excl. in [place]?    No data found.  Updated Vital Signs BP 134/73   Pulse 71   Temp 98.3 F (36.8 C)   Resp 18   SpO2 95%   Visual Acuity Right Eye Distance:   Left Eye  Distance:   Bilateral Distance:    Right Eye Near:   Left Eye Near:    Bilateral Near:     Physical Exam Vitals and nursing note reviewed.  Constitutional:      General: She is not in acute distress.    Appearance: Normal appearance. She is well-developed. She is not ill-appearing.  HENT:     Right Ear: Tympanic membrane and ear canal normal.     Left Ear: Tympanic membrane and ear canal normal.     Ears:     Comments: Dislodged PE tube in right ear canal mired in cerumen.  Patient states it has been there for years.     Nose: Nose normal.     Mouth/Throat:     Mouth: Mucous membranes are moist.     Pharynx: Oropharynx is clear.  Cardiovascular:     Rate and Rhythm: Normal rate and regular rhythm.     Heart sounds: Normal heart sounds.  Pulmonary:     Effort: Pulmonary effort is normal. No respiratory distress.     Breath sounds: Normal breath sounds.  Musculoskeletal:     Cervical back: Neck supple.  Skin:    General: Skin is warm and dry.  Neurological:     Mental Status: She is alert.  Psychiatric:        Mood and Affect: Mood normal.        Behavior: Behavior normal.      UC Treatments / Results  Labs (all labs ordered are listed, but only abnormal results are displayed) Labs Reviewed - No data to display  EKG   Radiology No results found.  Procedures Procedures (including critical care time)  Medications Ordered in UC Medications - No data to display  Initial Impression / Assessment and Plan / UC Course  I have reviewed the triage vital signs and the nursing notes.  Pertinent labs & imaging results that were available during my care of the patient were reviewed  by me and considered in my medical decision making (see chart for details).    Right otalgia.  Patient has a dislodged PE tube in her right ear canal that is mired in cerumen; she states it has been there for several years.  No indication of infection.  Discussed symptomatic treatment  including Tylenol or ibuprofen as needed.  Education provided on earache.  Instructed patient to follow up with her PCP or ENT if her symptoms are not improving.  She agrees to plan of care.    Final Clinical Impressions(s) / UC Diagnoses   Final diagnoses:  Right ear pain     Discharge Instructions      Take Tylenol or ibuprofen as needed for discomfort.  Follow up with your primary care provider or ENT if your symptoms are not improving.        ED Prescriptions   None    PDMP not reviewed this encounter.   Sharion Balloon, NP 02/18/22 1739

## 2022-02-18 NOTE — Discharge Instructions (Addendum)
Take Tylenol or ibuprofen as needed for discomfort.  Follow up with your primary care provider or ENT if your symptoms are not improving.

## 2022-02-18 NOTE — ED Triage Notes (Signed)
Pt presents with right ear pain and drainage x 2 days.

## 2022-05-16 ENCOUNTER — Other Ambulatory Visit: Payer: Self-pay | Admitting: Nurse Practitioner

## 2022-05-16 DIAGNOSIS — I10 Essential (primary) hypertension: Secondary | ICD-10-CM

## 2022-05-19 DIAGNOSIS — J329 Chronic sinusitis, unspecified: Secondary | ICD-10-CM | POA: Diagnosis not present

## 2022-05-24 ENCOUNTER — Other Ambulatory Visit: Payer: Self-pay

## 2022-05-24 ENCOUNTER — Encounter: Payer: Self-pay | Admitting: Pulmonary Disease

## 2022-05-24 ENCOUNTER — Emergency Department: Payer: Medicare Other

## 2022-05-24 ENCOUNTER — Inpatient Hospital Stay
Admission: EM | Admit: 2022-05-24 | Discharge: 2022-05-26 | DRG: 062 | Disposition: A | Discharge status: Home Health | Payer: Medicare Other | Attending: Obstetrics and Gynecology | Admitting: Obstetrics and Gynecology

## 2022-05-24 DIAGNOSIS — I69351 Hemiplegia and hemiparesis following cerebral infarction affecting right dominant side: Secondary | ICD-10-CM

## 2022-05-24 DIAGNOSIS — E785 Hyperlipidemia, unspecified: Secondary | ICD-10-CM | POA: Diagnosis not present

## 2022-05-24 DIAGNOSIS — E782 Mixed hyperlipidemia: Secondary | ICD-10-CM

## 2022-05-24 DIAGNOSIS — I6389 Other cerebral infarction: Secondary | ICD-10-CM | POA: Diagnosis not present

## 2022-05-24 DIAGNOSIS — F319 Bipolar disorder, unspecified: Secondary | ICD-10-CM | POA: Diagnosis present

## 2022-05-24 DIAGNOSIS — F17210 Nicotine dependence, cigarettes, uncomplicated: Secondary | ICD-10-CM | POA: Diagnosis not present

## 2022-05-24 DIAGNOSIS — R531 Weakness: Secondary | ICD-10-CM | POA: Diagnosis not present

## 2022-05-24 DIAGNOSIS — I639 Cerebral infarction, unspecified: Secondary | ICD-10-CM | POA: Diagnosis present

## 2022-05-24 DIAGNOSIS — Z79899 Other long term (current) drug therapy: Secondary | ICD-10-CM

## 2022-05-24 DIAGNOSIS — Z8673 Personal history of transient ischemic attack (TIA), and cerebral infarction without residual deficits: Secondary | ICD-10-CM

## 2022-05-24 DIAGNOSIS — Z955 Presence of coronary angioplasty implant and graft: Secondary | ICD-10-CM | POA: Diagnosis not present

## 2022-05-24 DIAGNOSIS — Z885 Allergy status to narcotic agent status: Secondary | ICD-10-CM | POA: Diagnosis not present

## 2022-05-24 DIAGNOSIS — E039 Hypothyroidism, unspecified: Secondary | ICD-10-CM | POA: Diagnosis present

## 2022-05-24 DIAGNOSIS — G43909 Migraine, unspecified, not intractable, without status migrainosus: Secondary | ICD-10-CM | POA: Diagnosis not present

## 2022-05-24 DIAGNOSIS — I10 Essential (primary) hypertension: Secondary | ICD-10-CM | POA: Diagnosis present

## 2022-05-24 DIAGNOSIS — Z888 Allergy status to other drugs, medicaments and biological substances status: Secondary | ICD-10-CM | POA: Diagnosis not present

## 2022-05-24 DIAGNOSIS — G8191 Hemiplegia, unspecified affecting right dominant side: Secondary | ICD-10-CM | POA: Diagnosis not present

## 2022-05-24 DIAGNOSIS — I63231 Cerebral infarction due to unspecified occlusion or stenosis of right carotid arteries: Principal | ICD-10-CM | POA: Diagnosis present

## 2022-05-24 DIAGNOSIS — Z743 Need for continuous supervision: Secondary | ICD-10-CM | POA: Diagnosis not present

## 2022-05-24 DIAGNOSIS — Z8249 Family history of ischemic heart disease and other diseases of the circulatory system: Secondary | ICD-10-CM | POA: Diagnosis not present

## 2022-05-24 DIAGNOSIS — Z7902 Long term (current) use of antithrombotics/antiplatelets: Secondary | ICD-10-CM | POA: Diagnosis not present

## 2022-05-24 DIAGNOSIS — Z882 Allergy status to sulfonamides status: Secondary | ICD-10-CM | POA: Diagnosis not present

## 2022-05-24 DIAGNOSIS — Z886 Allergy status to analgesic agent status: Secondary | ICD-10-CM | POA: Diagnosis not present

## 2022-05-24 DIAGNOSIS — I252 Old myocardial infarction: Secondary | ICD-10-CM | POA: Diagnosis not present

## 2022-05-24 DIAGNOSIS — R404 Transient alteration of awareness: Secondary | ICD-10-CM | POA: Diagnosis not present

## 2022-05-24 DIAGNOSIS — R2990 Unspecified symptoms and signs involving the nervous system: Secondary | ICD-10-CM | POA: Diagnosis not present

## 2022-05-24 DIAGNOSIS — Z720 Tobacco use: Secondary | ICD-10-CM | POA: Diagnosis present

## 2022-05-24 DIAGNOSIS — R29818 Other symptoms and signs involving the nervous system: Secondary | ICD-10-CM | POA: Diagnosis not present

## 2022-05-24 DIAGNOSIS — Z79890 Hormone replacement therapy (postmenopausal): Secondary | ICD-10-CM

## 2022-05-24 DIAGNOSIS — I2510 Atherosclerotic heart disease of native coronary artery without angina pectoris: Secondary | ICD-10-CM | POA: Diagnosis not present

## 2022-05-24 DIAGNOSIS — R29705 NIHSS score 5: Secondary | ICD-10-CM | POA: Diagnosis not present

## 2022-05-24 DIAGNOSIS — Z23 Encounter for immunization: Secondary | ICD-10-CM

## 2022-05-24 DIAGNOSIS — I6523 Occlusion and stenosis of bilateral carotid arteries: Secondary | ICD-10-CM | POA: Diagnosis not present

## 2022-05-24 DIAGNOSIS — R4781 Slurred speech: Secondary | ICD-10-CM | POA: Diagnosis not present

## 2022-05-24 DIAGNOSIS — G4489 Other headache syndrome: Secondary | ICD-10-CM | POA: Diagnosis not present

## 2022-05-24 LAB — PROTIME-INR
INR: 1 (ref 0.8–1.2)
Prothrombin Time: 12.8 seconds (ref 11.4–15.2)

## 2022-05-24 LAB — COMPREHENSIVE METABOLIC PANEL
ALT: 12 U/L (ref 0–44)
AST: 28 U/L (ref 15–41)
Albumin: 3.7 g/dL (ref 3.5–5.0)
Alkaline Phosphatase: 71 U/L (ref 38–126)
Anion gap: 4 — ABNORMAL LOW (ref 5–15)
BUN: 13 mg/dL (ref 8–23)
CO2: 25 mmol/L (ref 22–32)
Calcium: 9.2 mg/dL (ref 8.9–10.3)
Chloride: 107 mmol/L (ref 98–111)
Creatinine, Ser: 0.91 mg/dL (ref 0.44–1.00)
GFR, Estimated: >60 mL/min (ref >60)
Glucose, Bld: 94 mg/dL (ref 70–99)
Potassium: 4.8 mmol/L (ref 3.5–5.1)
Sodium: 136 mmol/L (ref 135–145)
Total Bilirubin: 1.1 mg/dL (ref 0.3–1.2)
Total Protein: 6.9 g/dL (ref 6.5–8.1)

## 2022-05-24 LAB — GLUCOSE, CAPILLARY: Glucose-Capillary: 93 mg/dL (ref 70–99)

## 2022-05-24 LAB — ETHANOL: Alcohol, Ethyl (B): <10 mg/dL (ref <10)

## 2022-05-24 LAB — DIFFERENTIAL
Abs Immature Granulocytes: 0.01 10*3/uL (ref 0.00–0.07)
Basophils Absolute: 0.1 10*3/uL (ref 0.0–0.1)
Basophils Relative: 1 %
Eosinophils Absolute: 0.1 10*3/uL (ref 0.0–0.5)
Eosinophils Relative: 2 %
Immature Granulocytes: 0 %
Lymphocytes Relative: 31 %
Lymphs Abs: 2 10*3/uL (ref 0.7–4.0)
Monocytes Absolute: 0.4 10*3/uL (ref 0.1–1.0)
Monocytes Relative: 7 %
Neutro Abs: 3.7 10*3/uL (ref 1.7–7.7)
Neutrophils Relative %: 59 %

## 2022-05-24 LAB — CBC
HCT: 32.8 % — ABNORMAL LOW (ref 36.0–46.0)
Hemoglobin: 10.4 g/dL — ABNORMAL LOW (ref 12.0–15.0)
MCH: 29.6 pg (ref 26.0–34.0)
MCHC: 31.7 g/dL (ref 30.0–36.0)
MCV: 93.4 fL (ref 80.0–100.0)
Platelets: 211 10*3/uL (ref 150–400)
RBC: 3.51 MIL/uL — ABNORMAL LOW (ref 3.87–5.11)
RDW: 13.6 % (ref 11.5–15.5)
WBC: 6.3 10*3/uL (ref 4.0–10.5)
nRBC: 0 % (ref 0.0–0.2)

## 2022-05-24 LAB — CBG MONITORING, ED: Glucose-Capillary: 93 mg/dL (ref 70–99)

## 2022-05-24 LAB — APTT: aPTT: 25 seconds (ref 24–36)

## 2022-05-24 MED ORDER — ACETAMINOPHEN 325 MG PO TABS: 650.0000 mg | ORAL_TABLET | ORAL | Status: DC | PRN

## 2022-05-24 MED ORDER — SENNOSIDES-DOCUSATE SODIUM 8.6-50 MG PO TABS: 1.0000 | ORAL_TABLET | Freq: Every evening | ORAL | Status: DC | PRN

## 2022-05-24 MED ORDER — CHLORHEXIDINE GLUCONATE CLOTH 2 % EX PADS
6.0000 | MEDICATED_PAD | Freq: Every day | CUTANEOUS | Status: DC
  Administered 2022-05-24 – 2022-05-26 (×3): 6 via TOPICAL

## 2022-05-24 MED ORDER — INFLUENZA VAC A&B SA ADJ QUAD 0.5 ML IM PRSY
0.5000 mL | PREFILLED_SYRINGE | INTRAMUSCULAR | Status: AC
  Administered 2022-05-26: 0.5 mL via INTRAMUSCULAR
  Filled 2022-05-24: qty 0.5

## 2022-05-24 MED ORDER — ACETAMINOPHEN 160 MG/5ML PO SOLN: 650.0000 mg | ORAL | Status: DC | PRN

## 2022-05-24 MED ORDER — HYDRALAZINE HCL 20 MG/ML IJ SOLN: 10.0000 mg | Freq: Four times a day (QID) | INTRAMUSCULAR | Status: DC | PRN

## 2022-05-24 MED ORDER — CLEVIDIPINE BUTYRATE 0.5 MG/ML IV EMUL: 0.0000 mg/h | INTRAVENOUS | Status: DC

## 2022-05-24 MED ORDER — ACETAMINOPHEN 650 MG RE SUPP: 650.0000 mg | RECTAL | Status: DC | PRN

## 2022-05-24 MED ORDER — SODIUM CHLORIDE 0.9% FLUSH
3.0000 mL | Freq: Once | INTRAVENOUS | Status: AC
  Administered 2022-05-24: 3 mL via INTRAVENOUS

## 2022-05-24 MED ORDER — PROCHLORPERAZINE EDISYLATE 10 MG/2ML IJ SOLN: 10.0000 mg | Freq: Four times a day (QID) | INTRAMUSCULAR | Status: DC | PRN

## 2022-05-24 MED ORDER — TENECTEPLASE FOR STROKE
16.0000 mg | PACK | Freq: Once | INTRAVENOUS | Status: AC
  Administered 2022-05-24: 16 mg via INTRAVENOUS
  Filled 2022-05-24: qty 10

## 2022-05-24 MED ORDER — STROKE: EARLY STAGES OF RECOVERY BOOK: Freq: Once | Status: AC

## 2022-05-24 MED ORDER — IOHEXOL 350 MG/ML SOLN
50.0000 mL | Freq: Once | INTRAVENOUS | Status: AC | PRN
  Administered 2022-05-24: 50 mL via INTRAVENOUS

## 2022-05-24 MED ORDER — SODIUM CHLORIDE 0.9 % IV SOLN: INTRAVENOUS | Status: DC

## 2022-05-24 MED ORDER — PANTOPRAZOLE SODIUM 40 MG IV SOLR
40.0000 mg | Freq: Every day | INTRAVENOUS | Status: DC
  Administered 2022-05-24 – 2022-05-25 (×2): 40 mg via INTRAVENOUS
  Filled 2022-05-24 (×2): qty 10

## 2022-05-24 NOTE — ED Notes (Signed)
Arrives via [facility]>  Near syncope while at the [employer].  LKW: 0370.  C/O right side headache, right arm weakness, right leg weakness and slurred speech.  Dr. Cinda Quest at bedside upon EMS arrival.  CBG complete.  To CT scan then to room 17

## 2022-05-24 NOTE — ED Provider Notes (Signed)
[facility] Provider Note    None    (approximate)   History   Chief complaint is CVA   HPI  Carol [name] is a 66 y.o. 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  She had a near syncopal event but did not fall.  She had some slurry speech which is improving.  The weakness is not improving.      Physical Exam   Triage Vital Signs: ED Triage Vitals  Enc Vitals Group     BP      Pulse      Resp      Temp      Temp src      SpO2      Weight      Height      Head Circumference      Peak Flow      Pain Score      Pain Loc      Pain Edu?      Excl. in [place]?     Most recent vital signs: Vitals:   05/24/22 1830 05/24/22 1830  BP: 133/66 (!) 148/73  Pulse: (!) 58 (!) 56  Resp: 17 16  Temp:  98 F (36.7 C)  SpO2: 100% 100%     General: Awake, no distress.  Alert and oriented Head normocephalic atraumatic CV:  Good peripheral perfusion.  Resp:  Normal effort.  Abd:  No distention.  Neuro patient with weakness in the right arm unable to keep it up for 10 seconds weakness in the right leg unable to lift it completely off the bed.  Left side is normal.  Speech is not slurry there is no facial droop currently.   ED Results / Procedures / Treatments   Labs (all labs ordered are listed, but only abnormal results are displayed) Labs Reviewed  CBC - Abnormal; Notable for the following components:      Result Value   RBC 3.51 (*)    Hemoglobin 10.4 (*)    HCT 32.8 (*)    All other components within normal limits  COMPREHENSIVE METABOLIC PANEL - Abnormal; Notable for the following components:   Anion gap 4 (*)    All other components within normal limits  DIFFERENTIAL  ETHANOL  PROTIME-INR  APTT  HIV ANTIBODY (ROUTINE TESTING W REFLEX)  LIPID PANEL  HEMOGLOBIN A1C  CBC  BASIC METABOLIC PANEL   PROTIME-INR  CBG MONITORING, ED  I-STAT CREATININE, ED  CBG MONITORING, ED     EKG EKG read interpreted by me shows sinus bradycardia rate of 52 normal axis no acute ST-T wave changes are seen.   RADIOLOGY CT code stroke read by radiology as negative CT reviewed by me and interpreted by me shows no acute disease CTA read by radiology as no LVO.  PROCEDURES:  Critical Care performed: Critical care time 25 minutes.  This includes meeting the patient and the hallway and examining her briefly they are escorting her to CT scan helping briefly in CT scan and then coming back with a bed for her.  Additionally I spoke to the neurologist 3 times I believe and then with the ICU doctor to get her admitted since she had had tPA.  Procedures   MEDICATIONS ORDERED IN ED: Medications  prochlorperazine (COMPAZINE) injection 10 mg (has no administration  in time range)   stroke: early stages of recovery book (has no administration in time range)  0.9 %  sodium chloride infusion (has no administration in time range)  acetaminophen (TYLENOL) tablet 650 mg (has no administration in time range)    Or  acetaminophen (TYLENOL) 160 MG/5ML solution 650 mg (has no administration in time range)    Or  acetaminophen (TYLENOL) suppository 650 mg (has no administration in time range)  senna-docusate (Senokot-S) tablet 1 tablet (has no administration in time range)  pantoprazole (PROTONIX) injection 40 mg (has no administration in time range)  clevidipine (CLEVIPREX) infusion 0.5 mg/mL (has no administration in time range)  hydrALAZINE (APRESOLINE) injection 10 mg (has no administration in time range)  sodium chloride flush (NS) 0.9 % injection 3 mL (3 mLs Intravenous Given 05/24/22 1724)  tenecteplase (TNKASE) injection for Stroke 16 mg (16 mg Intravenous Given 05/24/22 1725)  iohexol (OMNIPAQUE) 350 MG/ML injection 50 mL (50 mLs Intravenous Contrast Given 05/24/22 1735)     IMPRESSION / MDM / ASSESSMENT  AND PLAN / ED COURSE  I reviewed the triage vital signs and the nursing notes. ----------------------------------------- 7:00 PM on 05/24/2022 ----------------------------------------- Patient with resolving deficit at this point patient is now able to hold her right leg up off the bed for at least 10 seconds and is able to move her right arm much more readily.  Her speech is no longer slurred at all. Differential diagnosis includes, but is not limited to, CVA.  Complex migraine was also on the differential however because of the massive deficit neurology felt we had to give the tPA and this appears to been the right decision as patient is improving rapidly.  Patient's presentation is most consistent with acute presentation with potential threat to life or bodily function. The patient is on the cardiac monitor to evaluate for evidence of arrhythmia and/or significant heart rate changes.  None have been seen    FINAL CLINICAL IMPRESSION(S) / ED DIAGNOSES   Final diagnoses:  Cerebrovascular accident (CVA), unspecified mechanism ([place])     Rx / DC Orders   ED Discharge Orders     None        Note:  This document was prepared using Dragon voice recognition software and may include unintentional dictation errors.   Nena Polio, MD 05/24/22 Kathyrn Drown

## 2022-05-24 NOTE — ED Notes (Signed)
Patient has a stimulator in the right hip. She cannot receive an MRI.

## 2022-05-24 NOTE — ED Triage Notes (Addendum)
Pt arrives to ED from place of work via [place] with c/c of near syncopal episode, headache x10 days and right side weakness. Pt to CT on arrival. Dr Cinda Quest at bedside. Pt last seen normal at 1555 as reported by EMS.

## 2022-05-24 NOTE — H&P (Signed)
NAME:  TYRENE NADER, MRN:  [id number], DOB:  06-27-1956, LOS: 0 ADMISSION DATE:  05/24/2022, CONSULTATION DATE:  05/24/2022 REFERRING MD:  Dr. Cinda Quest, CHIEF COMPLAINT:  Right sided weakness, slurred speech   Brief Pt Description / Synopsis:  66 y.o. Female admitted with Acute CVA status post TNKase.  History of Present Illness:  Carol [name] is a 66 year old female with a past medical history significant for hypertension, hyperlipidemia, prior stroke with right-sided weakness (no residual deficits) migraines, and bipolar disorder who presented to [facility] ED on 05/24/2022 due to acute onset of near syncope with slurred speech and right arm and right leg weakness.  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  Last known well time was 3:45 PM she reported she had had a stroke in the past with similar symptoms which could completely resolved.  She has had a headache for couple of days which is unlike her old migraines on the right side, but had not had any focal deficits.  She denied chest pain, shortness of breath, cough, abdominal pain, nausea, vomiting, diarrhea, dysuria.  She is not on outpatient anticoagulants.  ED Course: Initial Vital Signs: Temperature 98 Fahrenheit orally, respiratory rate 16, pulse 69, blood pressure 144/64, SPO2 100% on room air Significant Labs: Hemoglobin 10.4, hematocrit 32.8, ethyl alcohol less than 10 Imaging CT head without contrast>>IMPRESSION: 1. Stable head CT, no acute intracranial process. CTA head and neck>>IMPRESSION: 1. No intracranial large vessel occlusion. Mild stenosis in the right supraclinoid ICA. 2. No hemodynamically significant stenosis in the neck. Medications Administered: TNKase at 17:25  She was evaluated by neurology, decision made to proceed with thrombolytic therapy.  PCCM is asked admit to ICU for further work-up and treatment.  Please see "significant hospital events" section below for full detailed  hospital course.   Pertinent  Medical History   Past Medical History:  Diagnosis Date   Bipolar affective disorder ([place])    Coronary artery disease    Hyperlipidemia    Hypertension    Hypothyroid    Myocardial infarction [facility])    Stroke ([place]) 11/15/2016   "right side weaker since" (12/20/2016)     Micro Data:  N/A  Antimicrobials:  N/A  Significant Hospital Events: Including procedures, antibiotic start and stop dates in addition to other pertinent events   10/9: Presented to ED with strokelike symptoms, evaluated by neurology and received TNKase.  PCCM asked to admit to ICU.  Interim History / Subjective:  -Afebrile, hemodynamically stable, blood pressure controlled, currently not requiring any IV infusions for hypertension -Status post taking ice at 17:25; patient reports symptoms are improving -Still noted to have some very mild weakness to the right arm and right leg -  Objective   Blood pressure 133/66, pulse (!) 52, temperature 98 F (36.7 C), resp. rate 18, height 5' (1.524 m), weight 63.5 kg, SpO2 99 %.        Intake/Output Summary (Last 24 hours) at 05/24/2022 1913 Last data filed at 05/24/2022 1724 Gross per 24 hour  Intake 10 ml  Output --  Net 10 ml   Filed Weights   05/24/22 1714  Weight: 63.5 kg    Examination: General: Acutely ill-appearing female, sitting in bed, on room air, no acute distress HENT: Atraumatic, normocephalic, neck supple, no JVD Lungs: Clear breath sounds throughout, even, nonlabored Cardiovascular: Bradycardia, regular rhythm, S1-S2, no murmurs, rubs, gallops Abdomen: Soft, nontender, nondistended, no guarding rebound tenderness, bowel sounds positive x4 Extremities: Normal bulk and  tone, no deformities, no edema Neuro: Awake and alert, oriented x4, moves all extremities to command, mild weakness to right upper and lower extremities, pupils PERRLA, speech clear GU: Deferred  Resolved Hospital Problem list     Assessment &  Plan:   Acute ischemic CVA, status post TNKase on 10/9 PMHx: Prior stroke, migraines, bipolar disorder CT head without contrast on admission negative for acute intracranial abnormality CTA head neck without large intracranial vessel occlusion, no significant neck stenosis -ICU/Cardiac monitoring -Frequent neurochecks and vital signs as per TNK protocol -Strict blood pressure control: Goal SBP <180 and DBP<105 -As needed hydralazine and Cleviprex drip if needed to maintain goal blood pressure -Repeat CT head in 24 hours (unable to obtain MRI due to stimulator) -Obtain 2D echocardiogram -Check hemoglobin A1c and lipid panel -Avoid aspirin and anticoagulations for 24 hours -PT/OT/speech therapy consults -Obtain chest x-ray, urinalysis, urine drug screen -Neurology consulted, appreciate input -Avoid NSAIDs for headache, neurology has ordered Compazine for migraines (recommends addition of Benadryl, Reglan, Compazine or even a one-time dose of Solu-Medrol 500 mg if another agent needed)   Best Practice (right click and "Reselect all SmartList Selections" daily)   Diet/type: NPO DVT prophylaxis: SCD GI prophylaxis: PPI Lines: N/A Foley:  N/A Code Status:  full code Last date of multidisciplinary goals of care discussion [N/A]  Patient and her spouse updated at bedside.  All questions answered.  Labs   CBC: Recent Labs  Lab 05/24/22 1752  WBC 6.3  NEUTROABS 3.7  HGB 10.4*  HCT 32.8*  MCV 93.4  PLT 478    Basic Metabolic Panel: Recent Labs  Lab 05/24/22 1752  NA 136  K 4.8  CL 107  CO2 25  GLUCOSE 94  BUN 13  CREATININE 0.91  CALCIUM 9.2   GFR: Estimated Creatinine Clearance: 49.2 mL/min (by C-G formula based on SCr of 0.91 mg/dL). Recent Labs  Lab 05/24/22 1752  WBC 6.3    Liver Function Tests: Recent Labs  Lab 05/24/22 1752  AST 28  ALT 12  ALKPHOS 71  BILITOT 1.1  PROT 6.9  ALBUMIN 3.7   No results for input(s): "LIPASE", "AMYLASE" in the last  168 hours. No results for input(s): "AMMONIA" in the last 168 hours.  ABG No results found for: "PHART", "PCO2ART", "PO2ART", "HCO3", "TCO2", "ACIDBASEDEF", "O2SAT"   Coagulation Profile: Recent Labs  Lab 05/24/22 1842  INR 1.0    Cardiac Enzymes: No results for input(s): "CKTOTAL", "CKMB", "CKMBINDEX", "TROPONINI" in the last 168 hours.  HbA1C: Hemoglobin A1C  Date/Time Value Ref Range Status  06/26/2019 04:39 PM 5.4 4.0 - 5.6 % Final   Hgb A1c MFr Bld  Date/Time Value Ref Range Status  02/27/2015 05:17 AM 5.2 4.0 - 6.0 % Final    CBG: Recent Labs  Lab 05/24/22 1657  GLUCAP 93    Review of Systems:   Positives in BOLD: Gen: Denies fever, chills, weight change, fatigue, night sweats HEENT: Denies blurred vision, double vision, hearing loss, tinnitus, sinus congestion, rhinorrhea, sore throat, neck stiffness, dysphagia PULM: Denies shortness of breath, cough, sputum production, hemoptysis, wheezing CV: Denies chest pain, edema, orthopnea, paroxysmal nocturnal dyspnea, palpitations GI: Denies abdominal pain, nausea, vomiting, diarrhea, hematochezia, melena, constipation, change in bowel habits GU: Denies dysuria, hematuria, polyuria, oliguria, urethral discharge Endocrine: Denies hot or cold intolerance, polyuria, polyphagia or appetite change Derm: Denies rash, dry skin, scaling or peeling skin change Heme: Denies easy bruising, bleeding, bleeding gums Neuro: Denies headache, numbness, weakness, slurred speech, loss of  memory or consciousness   Past Medical History:  She,  has a past medical history of Bipolar affective disorder ([place]), Coronary artery disease, Hyperlipidemia, Hypertension, Hypothyroid, Myocardial infarction [facility]), and Stroke ([place]) (11/15/2016).   Surgical History:   Past Surgical History:  Procedure Laterality Date   ABDOMINAL HYSTERECTOMY     APPENDECTOMY     CARDIAC CATHETERIZATION Left 09/26/2015   Procedure: Left Heart Cath and Coronary  Angiography;  Surgeon: Teodoro Spray, MD;  Location: [place] [facility];  Service: Cardiovascular;  Laterality: Left;   CHOLECYSTECTOMY N/A 12/22/2016   Procedure: LAPAROSCOPIC CHOLECYSTECTOMY WITH INTRAOPERATIVE CHOLANGIOGRAM;  Surgeon: Jovita Kussmaul, MD;  Location: [place];  Service: General;  Laterality: N/A;   CORONARY STENT INTERVENTION N/A 04/13/2018   Procedure: CORONARY STENT INTERVENTION;  Surgeon: Yolonda Kida, MD;  Location: [place] [facility];  Service: Cardiovascular;  Laterality: N/A;   ERCP N/A 12/20/2016   Procedure: ENDOSCOPIC RETROGRADE CHOLANGIOPANCREATOGRAPHY (ERCP);  Surgeon: Teena Irani, MD;  Location: [facility];  Service: Endoscopy;  Laterality: N/A;   HYSTEROTOMY     LEFT HEART CATH AND CORONARY ANGIOGRAPHY Left 04/13/2018   Procedure: LEFT HEART CATH AND CORONARY ANGIOGRAPHY;  Surgeon: Teodoro Spray, MD;  Location: [employer];  Service: Cardiovascular;  Laterality: Left;   LEFT HEART CATH AND CORONARY ANGIOGRAPHY Left 12/25/2019   Procedure: LEFT HEART CATH AND CORONARY ANGIOGRAPHY;  Surgeon: Teodoro Spray, MD;  Location: [place] [facility];  Service: Cardiovascular;  Laterality: Left;     Social History:   reports that she has been smoking cigarettes. She has a 22.50 pack-year smoking history. She has never used smokeless tobacco. She reports that she does not currently use alcohol. She reports current drug use.   Family History:  Her family history includes CAD in an other family member.   Allergies Allergies  Allergen Reactions   Morphine Other (See Comments)    Hallucinations    Sulfa Antibiotics Hives   Sulfamethoxazole-Trimethoprim Swelling   Azithromycin Diarrhea and Nausea And Vomiting    Burn stomach   Pregabalin Other (See Comments)    Pt states that she was told not to take this medication.     Asa [Aspirin] Hives, Nausea Only and Other (See Comments)    Burning of stomach    Rofecoxib Other (See Comments)    Burning of  stomach      Home Medications  Prior to Admission medications   Medication Sig Start Date End Date Taking? Authorizing Provider  acetaminophen (TYLENOL) 500 MG tablet Take 1,000 mg by mouth every 6 (six) hours as needed for mild pain or headache.    [provider]  albuterol (PROVENTIL HFA;VENTOLIN HFA) 108 (90 BASE) MCG/ACT inhaler Inhale 2 puffs into the lungs every 6 (six) hours as needed for wheezing or shortness of breath.    [provider]  albuterol (PROVENTIL) (2.5 MG/3ML) 0.083% nebulizer solution USE 1 VIAL VIA NEBULIZER  EVERY 4 TO 6 HOURS AS  NEEDED Patient taking differently: Take 2.5 mg by nebulization every 4 (four) hours as needed for wheezing or shortness of breath. 09/07/17   Lavera Guise, MD  atorvastatin (LIPITOR) 10 MG tablet Take 1 tablet (10 mg total) by mouth daily. Take 1 tab po daily 10/19/21   Jonetta Osgood, NP  Calcium Carbonate-Vitamin D 600-200 MG-UNIT CAPS Take 1 tablet by mouth daily.     [provider]  cetirizine (ZYRTEC) 10 MG tablet Take 1 tablet (10 mg total)  by mouth daily. 10/19/21   Jonetta Osgood, NP  citalopram (CELEXA) 40 MG tablet TAKE 1 TABLET BY MOUTH  DAILY 05/03/21   Lavera Guise, MD  clopidogrel (PLAVIX) 75 MG tablet Take 75 mg by mouth daily.     [provider]  clopidogrel (PLAVIX) 75 MG tablet Take 1 tablet by mouth daily. 06/19/21   [provider]  diltiazem (CARDIZEM CD) 120 MG 24 hr capsule Take 1 capsule (120 mg total) by mouth daily. 10/19/21   Jonetta Osgood, NP  diltiazem (CARDIZEM) 30 MG tablet Take 30 mg by mouth daily as needed (Heart palpitations).  10/25/19   [provider]  fluticasone (FLONASE) 50 MCG/ACT nasal spray Place 1 spray into both nostrils daily. 10/19/21   Jonetta Osgood, NP  isosorbide mononitrate (IMDUR) 30 MG 24 hr tablet Take 1 tablet (30 mg total) by mouth daily. 10/19/21   Jonetta Osgood, NP  levothyroxine (SYNTHROID) 100 MCG tablet TAKE 1 TABLET BY  MOUTH DAILY 12/14/21   Lavera Guise, MD  lisinopril (ZESTRIL) 10 MG tablet TAKE 1 TABLET BY MOUTH DAILY 05/17/22   Lavera Guise, MD  metoprolol tartrate (LOPRESSOR) 25 MG tablet Take 12.5 mg by mouth daily.     [provider]  nitroGLYCERIN (NITROSTAT) 0.4 MG SL tablet Place 1 tablet (0.4 mg total) under the tongue every 5 (five) minutes as needed for chest pain. 10/19/21   Jonetta Osgood, NP  ondansetron (ZOFRAN-ODT) 4 MG disintegrating tablet Take 1 tablet (4 mg total) by mouth every 8 (eight) hours as needed for nausea or vomiting. 11/27/19   Ronnell Freshwater, NP  pantoprazole (PROTONIX) 40 MG tablet Take 1 tablet (40 mg total) by mouth 2 (two) times daily. 10/19/21   Jonetta Osgood, NP  rosuvastatin (CRESTOR) 10 MG tablet Take 10 mg by mouth at bedtime. 08/10/21   [provider]  SYRINGE/NEEDLE, DISP, 1 ML 23G X 1" 1 ML MISC 1 each by Does not apply route once a week. 07/26/19   Kendell Bane, NP     Critical care time: 50 minutes     Darel Hong, AGACNP-BC [facility] Prefer epic messenger for cross cover needs If after hours, please call E-link

## 2022-05-24 NOTE — Progress Notes (Signed)
eLink Physician-Brief Progress Note Patient Name: Carol [name] DOB: 1956/06/13 MRN: [id number]   Date of Service  05/24/2022  HPI/Events of Note  Patient admitted with altered mental status and right sided weakness, she received TNK for suspected CVA, preliminary head imaging is negative, stroke work up in progress.  eICU Interventions  New Patient Evaluation.        Kerry Kass Charitie Hinote 05/24/2022, 10:07 PM

## 2022-05-24 NOTE — ED Notes (Addendum)
Patient has sensory deficits to the right side of face, arms and legs.

## 2022-05-24 NOTE — Consult Note (Signed)
[facility] Telemedicine Consult  Requesting Provider: Dr Cinda Quest Consult Participants: Dr. Jerelyn Charles, Telespecialist RN Arnette Schaumann   Bedside RN-Tom Location of the provider: Home  Location of the patient: [place]  This consult was provided via telemedicine with 2-way video and audio communication. The patient/family was informed that care would be provided in this way and agreed to receive care in this manner.    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  She has had a stroke in the past with similar symptoms which had completely resolved. She has had a headache now for a couple of days which is unlike her old migraines on the right side but did not have any focal neurological deficits. Focal neurological deficits on the right side started suddenly right after 3:45 PM. Denies any chest pain shortness of breath. Denies any visual changes at this time. Reports tobacco abuse denies drug abuse. Blood pressures 155/162 on the field.  Blood sugar 109 No recent surgeries.  No blood thinners.    Past Medical History:  Diagnosis Date   Bipolar affective disorder ([place])    Coronary artery disease    Hyperlipidemia    Hypertension    Hypothyroid    Myocardial infarction [facility])    Stroke ([place]) 11/15/2016   "right side weaker since" (12/20/2016)     Current Facility-Administered Medications:    prochlorperazine (COMPAZINE) injection 10 mg, 10 mg, Intravenous, Q6H PRN, Amie Portland, MD   sodium chloride flush (NS) 0.9 % injection 3 mL, 3 mL, Intravenous, Once, Nena Polio, MD   tenecteplase [facility]) injection for Stroke 16 mg, 16 mg, Intravenous, Once, Amie Portland,  MD  Current Outpatient Medications:    acetaminophen (TYLENOL) 500 MG tablet, Take 1,000 mg by mouth every 6 (six) hours as needed for mild pain or headache., Disp: , Rfl:    albuterol (PROVENTIL HFA;VENTOLIN HFA) 108 (90 BASE) MCG/ACT inhaler, Inhale 2 puffs into the lungs every 6 (six) hours as needed for wheezing or shortness of breath., Disp: , Rfl:    albuterol (PROVENTIL) (2.5 MG/3ML) 0.083% nebulizer solution, USE 1 VIAL VIA NEBULIZER  EVERY 4 TO 6 HOURS AS  NEEDED (Patient taking differently: Take 2.5 mg by nebulization every 4 (four) hours as needed for wheezing or shortness of breath.), Disp: 720 mL, Rfl: 1   atorvastatin (LIPITOR) 10 MG tablet, Take 1 tablet (10 mg total) by mouth daily. Take 1 tab po daily, Disp: 90 tablet, Rfl: 3   Calcium Carbonate-Vitamin D 600-200 MG-UNIT CAPS, Take 1 tablet by mouth daily. , Disp: , Rfl:    cetirizine (ZYRTEC) 10 MG tablet, Take 1 tablet (10 mg total) by mouth daily., Disp: 90 tablet, Rfl: 1   citalopram (CELEXA) 40 MG tablet, TAKE 1 TABLET BY MOUTH  DAILY, Disp: 90 tablet, Rfl: 3   clopidogrel (PLAVIX) 75 MG tablet, Take 75 mg by mouth daily. , Disp: , Rfl:    clopidogrel (PLAVIX) 75 MG tablet, Take 1 tablet by mouth daily., Disp: , Rfl:    diltiazem (CARDIZEM CD) 120 MG 24 hr capsule, Take 1 capsule (120 mg total) by mouth daily., Disp: 90 capsule, Rfl: 3   diltiazem (CARDIZEM) 30 MG  tablet, Take 30 mg by mouth daily as needed (Heart palpitations). , Disp: , Rfl:    fluticasone (FLONASE) 50 MCG/ACT nasal spray, Place 1 spray into both nostrils daily., Disp: 16 g, Rfl: 5   isosorbide mononitrate (IMDUR) 30 MG 24 hr tablet, Take 1 tablet (30 mg total) by mouth daily., Disp: 90 tablet, Rfl: 3   levothyroxine (SYNTHROID) 100 MCG tablet, TAKE 1 TABLET BY MOUTH DAILY, Disp: 60 tablet, Rfl: 5   lisinopril (ZESTRIL) 10 MG tablet, TAKE 1 TABLET BY MOUTH DAILY, Disp: 30 tablet, Rfl: 0   metoprolol tartrate (LOPRESSOR) 25 MG tablet, Take 12.5 mg by mouth  daily. , Disp: , Rfl:    nitroGLYCERIN (NITROSTAT) 0.4 MG SL tablet, Place 1 tablet (0.4 mg total) under the tongue every 5 (five) minutes as needed for chest pain., Disp: 30 tablet, Rfl: 2   ondansetron (ZOFRAN-ODT) 4 MG disintegrating tablet, Take 1 tablet (4 mg total) by mouth every 8 (eight) hours as needed for nausea or vomiting., Disp: 30 tablet, Rfl: 0   pantoprazole (PROTONIX) 40 MG tablet, Take 1 tablet (40 mg total) by mouth 2 (two) times daily., Disp: 180 tablet, Rfl: 3   rosuvastatin (CRESTOR) 10 MG tablet, Take 10 mg by mouth at bedtime., Disp: , Rfl:    SYRINGE/NEEDLE, DISP, 1 ML 23G X 1" 1 ML MISC, 1 each by Does not apply route once a week., Disp: 6 each, Rfl: 0    LKW: 3:45 PM TNK given: Yes IR Thrombectomy? No, no evidence of LVO on imaging and clinical exam also not consistent with LVO Modified Rankin Scale: 0-Completely asymptomatic and back to baseline post- stroke Time of teleneurologist evaluation: 1704 hrs.  Exam: There were no vitals filed for this visit.  Blood pressure within range for tPA administration  General: Awake alert in some distress due to headache CVS: Regular rate rhythm on the monitor Respiratory: Breathing well saturating normally on room air Neurological exam Awake alert oriented x3 Mild dysarthria No aphasia Cranial nerve examination with no deficits Motor examination with significant right upper and lower extremity weakness Sensation: Mildly diminished on the right with sensation of heaviness Coordination intact on the left, impaired on the right   NIHSS 1A: Level of Consciousness - 0 1B: Ask Month and Age - 0 1C: 'Blink Eyes' & 'Squeeze Hands' - 0 2: Test Horizontal Extraocular Movements - 0 3: Test Visual Fields - 0 4: Test Facial Palsy - 0 5A: Test Left Arm Motor Drift - 0 5B: Test Right Arm Motor Drift - 2 6A: Test Left Leg Motor Drift - 0 6B: Test Right Leg Motor Drift - 3 7: Test Limb Ataxia - 0 8: Test Sensation - 1 9:  Test Language/Aphasia- 0 10: Test Dysarthria - 1 11: Test Extinction/Inattention - 0 NIHSS score: 7   Imaging Reviewed: CT head: No acute changes.  Aspects 10 CT angiography head and neck: Radiology read pending-no emergent LVO on my preliminary review.  Labs reviewed in epic and pertinent values follow: No new labs have resulted yet  Assessment: 66 year old with above past medical history presenting for sudden onset of right hemiparesis which spares the face.  Has a history of ongoing headache for the past couple of days but reports that she has never had any focal symptoms with her previous headaches.  She has had a stroke/TIA in the past affecting the right side with complete resolution of symptoms-that was somewhat in 2012-chart has right-sided weaker listed since 2018 but she says  she has had no right-sided deficits just prior to 3:45 PM which is her last known well. I discussed with her that my suspicion is that this is likely a stroke mimic-complex migraine but a small stroke given her risk factors cannot be ruled out and I did offer her IV thrombolysis with clear explanations of risks benefits and alternatives.  She agreed to proceed with IV TNKase. Her blood pressure was within goal and IV TNKase was administered at 1725 hrs.  Impression: Acute ischemic stroke versus a stroke mimic status post IV TNKase Prior history of bipolar affective disorder, coronary artery disease, hyperlipidemia, hypertension, hypothyroidism, stroke and tobacco abuse.  Recommendations:  Admit to the ICU service at [facility] Post TNK vitals and neurochecks Systolic blood pressure goal less than 180 at all times Use as needed labetalol or hydralazine and if continuous infusion is required, use Cleviprex Telemetry 2D echo A1c Lipid panel PT OT Speech therapy General medical management per the critical care team Check CBC BMP. Check chest x-ray and urinalysis Check drug screen  Avoid  NSAIDs for headache.  I have ordered Compazine every 6 hours for headache presuming it is her migraine.  Can use Benadryl, Reglan, Compazine or even a one-time dose of Solu-Medrol 500 IV and if needed another agent-valproate 500 mg 1 times IV to break the cycle of her pain.  Neurology service at [facility] will follow with you again tomorrow Plan discussed in detail with Dr. Conni Slipper, ED provider and patient's bedside RN   This patient is receiving care for possible acute neurological changes. There was 40 minutes of care by this provider at the time of service, including time for direct evaluation via telemedicine, review of medical records, imaging studies and discussion of findings with providers, the patient and/or family.  CT head was personally reviewed prior to TNK-no evidence of bleed. Risk benefits and alternatives of IV TNKase were discussed prior to administration.  All questions answered.  -- Amie Portland, MD [facility] Pager: [phone] If 7pm to 7am, please call on call as listed on AMION.

## 2022-05-24 NOTE — Consult Note (Signed)
Code stroke activated on EMS arrival at 1655. EDP assessing pt at this time and receiving report from EMS.  LKW 1715. mRS 0.   Page sent to Dr Leonie Man, back-up neuro at 1700. Dr Rory Percy called back at 1701 saying he is taking over for Dr Leonie Man. Informed him of code stroke and he got on camera at 1704. Assessed pt in CT.  Decision for TNK at 1711. Discussion with neuro and pt about current stated weight since pt not on scale bed. Order placed at 1714. Pharmacy mixing dose in CT at 1718. VS machine unavailable in CT for preTNK BP. VS taken at 1724 and within safe limits per protocol for TNK bolus at 1725.  CTA performed prior to returning to room at 1735.   Pt stable at time of disconnection from camera at 1756.

## 2022-05-24 NOTE — Progress Notes (Signed)
CODE STROKE- PHARMACY COMMUNICATION   Time CODE STROKE called/page received: [id number]  Time response to CODE STROKE was made (in person or via phone): 1650  Time Stroke Kit retrieved from [place] (only if needed):1655  Name of Provider/Nurse contacted:Dr. Rory Percy  Past Medical History:  Diagnosis Date   Bipolar affective disorder ([place])    Coronary artery disease    Hyperlipidemia    Hypertension    Hypothyroid    Myocardial infarction [facility])    Stroke ([place]) 11/15/2016   "right side weaker since" (12/20/2016)   Prior to Admission medications   Medication Sig Start Date End Date Taking? Authorizing Provider  acetaminophen (TYLENOL) 500 MG tablet Take 1,000 mg by mouth every 6 (six) hours as needed for mild pain or headache.    [provider]  albuterol (PROVENTIL HFA;VENTOLIN HFA) 108 (90 BASE) MCG/ACT inhaler Inhale 2 puffs into the lungs every 6 (six) hours as needed for wheezing or shortness of breath.    [provider]  albuterol (PROVENTIL) (2.5 MG/3ML) 0.083% nebulizer solution USE 1 VIAL VIA NEBULIZER  EVERY 4 TO 6 HOURS AS  NEEDED Patient taking differently: Take 2.5 mg by nebulization every 4 (four) hours as needed for wheezing or shortness of breath. 09/07/17   Lavera Guise, MD  atorvastatin (LIPITOR) 10 MG tablet Take 1 tablet (10 mg total) by mouth daily. Take 1 tab po daily 10/19/21   Jonetta Osgood, NP  Calcium Carbonate-Vitamin D 600-200 MG-UNIT CAPS Take 1 tablet by mouth daily.     [provider]  cetirizine (ZYRTEC) 10 MG tablet Take 1 tablet (10 mg total) by mouth daily. 10/19/21   Jonetta Osgood, NP  citalopram (CELEXA) 40 MG tablet TAKE 1 TABLET BY MOUTH  DAILY 05/03/21   Lavera Guise, MD  clopidogrel (PLAVIX) 75 MG tablet Take 75 mg by mouth daily.     [provider]  clopidogrel (PLAVIX) 75 MG tablet Take 1 tablet by mouth daily. 06/19/21   [provider]  diltiazem (CARDIZEM CD) 120 MG 24 hr capsule Take 1 capsule (120 mg  total) by mouth daily. 10/19/21   Jonetta Osgood, NP  diltiazem (CARDIZEM) 30 MG tablet Take 30 mg by mouth daily as needed (Heart palpitations).  10/25/19   [provider]  fluticasone (FLONASE) 50 MCG/ACT nasal spray Place 1 spray into both nostrils daily. 10/19/21   Jonetta Osgood, NP  isosorbide mononitrate (IMDUR) 30 MG 24 hr tablet Take 1 tablet (30 mg total) by mouth daily. 10/19/21   Jonetta Osgood, NP  levothyroxine (SYNTHROID) 100 MCG tablet TAKE 1 TABLET BY MOUTH DAILY 12/14/21   Lavera Guise, MD  lisinopril (ZESTRIL) 10 MG tablet TAKE 1 TABLET BY MOUTH DAILY 05/17/22   Lavera Guise, MD  metoprolol tartrate (LOPRESSOR) 25 MG tablet Take 12.5 mg by mouth daily.     [provider]  nitroGLYCERIN (NITROSTAT) 0.4 MG SL tablet Place 1 tablet (0.4 mg total) under the tongue every 5 (five) minutes as needed for chest pain. 10/19/21   Jonetta Osgood, NP  ondansetron (ZOFRAN-ODT) 4 MG disintegrating tablet Take 1 tablet (4 mg total) by mouth every 8 (eight) hours as needed for nausea or vomiting. 11/27/19   Ronnell Freshwater, NP  pantoprazole (PROTONIX) 40 MG tablet Take 1 tablet (40 mg total) by mouth 2 (two) times daily. 10/19/21   Jonetta Osgood, NP  rosuvastatin (CRESTOR) 10 MG tablet Take 10 mg by mouth at bedtime. 08/10/21   [provider]  SYRINGE/NEEDLE, DISP, 1 ML 23G X 1" 1 ML MISC 1 each by Does not apply route once a week. 07/26/19   Kendell Bane, NP    Wynelle Cleveland ,PharmD Clinical Pharmacist  05/24/2022  5:29 PM

## 2022-05-25 ENCOUNTER — Inpatient Hospital Stay: Payer: Medicare Other

## 2022-05-25 ENCOUNTER — Inpatient Hospital Stay (HOSPITAL_COMMUNITY)
Admit: 2022-05-25 | Discharge: 2022-05-25 | Disposition: A | Discharge status: Home / Self Care | Payer: Medicare Other | Attending: Pulmonary Disease | Admitting: Pulmonary Disease

## 2022-05-25 DIAGNOSIS — I6389 Other cerebral infarction: Secondary | ICD-10-CM | POA: Diagnosis not present

## 2022-05-25 LAB — BASIC METABOLIC PANEL
Anion gap: 5 (ref 5–15)
BUN: 12 mg/dL (ref 8–23)
CO2: 25 mmol/L (ref 22–32)
Calcium: 9 mg/dL (ref 8.9–10.3)
Chloride: 111 mmol/L (ref 98–111)
Creatinine, Ser: 0.72 mg/dL (ref 0.44–1.00)
GFR, Estimated: >60 mL/min (ref >60)
Glucose, Bld: 96 mg/dL (ref 70–99)
Potassium: 3.6 mmol/L (ref 3.5–5.1)
Sodium: 141 mmol/L (ref 135–145)

## 2022-05-25 LAB — CBC
HCT: 32.8 % — ABNORMAL LOW (ref 36.0–46.0)
Hemoglobin: 10.4 g/dL — ABNORMAL LOW (ref 12.0–15.0)
MCH: 29.4 pg (ref 26.0–34.0)
MCHC: 31.7 g/dL (ref 30.0–36.0)
MCV: 92.7 fL (ref 80.0–100.0)
Platelets: 184 10*3/uL (ref 150–400)
RBC: 3.54 MIL/uL — ABNORMAL LOW (ref 3.87–5.11)
RDW: 13.3 % (ref 11.5–15.5)
WBC: 5.8 10*3/uL (ref 4.0–10.5)
nRBC: 0 % (ref 0.0–0.2)

## 2022-05-25 LAB — ECHOCARDIOGRAM COMPLETE
AR max vel: 3.01 cm2
AV Area VTI: 3.85 cm2
AV Area mean vel: 3.54 cm2
AV Mean grad: 4 mmHg
AV Peak grad: 8.9 mmHg
Ao pk vel: 1.49 m/s
Area-P 1/2: 3.6 cm2
Height: 60 in
S' Lateral: 2.8 cm
Weight: 2215.18 oz

## 2022-05-25 LAB — HIV ANTIBODY (ROUTINE TESTING W REFLEX): HIV Screen 4th Generation wRfx: NONREACTIVE

## 2022-05-25 LAB — URINE DRUG SCREEN, QUALITATIVE (ARMC ONLY)
Amphetamines, Ur Screen: NOT DETECTED
Barbiturates, Ur Screen: NOT DETECTED
Benzodiazepine, Ur Scrn: NOT DETECTED
Cannabinoid 50 Ng, Ur ~~LOC~~: NOT DETECTED
Cocaine Metabolite,Ur ~~LOC~~: NOT DETECTED
MDMA (Ecstasy)Ur Screen: NOT DETECTED
Methadone Scn, Ur: NOT DETECTED
Opiate, Ur Screen: NOT DETECTED
Phencyclidine (PCP) Ur S: NOT DETECTED
Tricyclic, Ur Screen: NOT DETECTED

## 2022-05-25 LAB — MAGNESIUM: Magnesium: 2.1 mg/dL (ref 1.7–2.4)

## 2022-05-25 LAB — URINALYSIS, COMPLETE (UACMP) WITH MICROSCOPIC
Bilirubin Urine: NEGATIVE
Glucose, UA: NEGATIVE mg/dL
Hgb urine dipstick: NEGATIVE
Ketones, ur: NEGATIVE mg/dL
Leukocytes,Ua: NEGATIVE
Nitrite: NEGATIVE
Protein, ur: NEGATIVE mg/dL
Specific Gravity, Urine: 1.036 — ABNORMAL HIGH (ref 1.005–1.030)
pH: 6 (ref 5.0–8.0)

## 2022-05-25 LAB — LIPID PANEL
Cholesterol: 183 mg/dL (ref 0–200)
HDL: 55 mg/dL (ref >40)
LDL Cholesterol: 105 mg/dL — ABNORMAL HIGH (ref 0–99)
Total CHOL/HDL Ratio: 3.3 RATIO
Triglycerides: 115 mg/dL (ref <150)
VLDL: 23 mg/dL (ref 0–40)

## 2022-05-25 LAB — MRSA NEXT GEN BY PCR, NASAL: MRSA by PCR Next Gen: NOT DETECTED

## 2022-05-25 LAB — PROTIME-INR
INR: 1.1 (ref 0.8–1.2)
Prothrombin Time: 13.6 seconds (ref 11.4–15.2)

## 2022-05-25 LAB — HEMOGLOBIN A1C
Hgb A1c MFr Bld: 5.6 % (ref 4.8–5.6)
Mean Plasma Glucose: 114.02 mg/dL

## 2022-05-25 LAB — PHOSPHORUS: Phosphorus: 3.4 mg/dL (ref 2.5–4.6)

## 2022-05-25 NOTE — Progress Notes (Signed)
PT Cancellation Note  Patient Details Name: Carol [name] MRN: [id number] DOB: [birth year]   Cancelled Treatment:    Reason Eval/Treat Not Completed: Medical issues which prohibited therapy Patient admitted to ICU for CVA work up with TNK infusion (05/24/22, 1725).  Per guidelines, to be on strict bedrest x24 hours post infusion and follow up imaging required to be completed prior to initiation of therapy. Will continue to follow and initiate as medically appropriate.    Kreg Shropshire, DPT 05/25/2022, 11:13 AM

## 2022-05-25 NOTE — Evaluation (Signed)
Clinical/Bedside Swallow Evaluation Patient Details  Name: Carol [name] MRN: [id number] Date of Birth: December 01, 1955  Today's Date: 05/25/2022 Time: SLP Start Time (ACUTE ONLY): 61 SLP Stop Time (ACUTE ONLY): 42 SLP Time Calculation (min) (ACUTE ONLY): 55 min  Past Medical History:  Past Medical History:  Diagnosis Date   Bipolar affective disorder ([place])    Coronary artery disease    Hyperlipidemia    Hypertension    Hypothyroid    Myocardial infarction [facility])    Stroke ([place]) 11/15/2016   "right side weaker since" (12/20/2016)   Past Surgical History:  Past Surgical History:  Procedure Laterality Date   ABDOMINAL HYSTERECTOMY     APPENDECTOMY     CARDIAC CATHETERIZATION Left 09/26/2015   Procedure: Left Heart Cath and Coronary Angiography;  Surgeon: Teodoro Spray, MD;  Location: [place] [facility];  Service: Cardiovascular;  Laterality: Left;   CHOLECYSTECTOMY N/A 12/22/2016   Procedure: LAPAROSCOPIC CHOLECYSTECTOMY WITH INTRAOPERATIVE CHOLANGIOGRAM;  Surgeon: Jovita Kussmaul, MD;  Location: [place];  Service: General;  Laterality: N/A;   CORONARY STENT INTERVENTION N/A 04/13/2018   Procedure: CORONARY STENT INTERVENTION;  Surgeon: Yolonda Kida, MD;  Location: [place] [facility];  Service: Cardiovascular;  Laterality: N/A;   ERCP N/A 12/20/2016   Procedure: ENDOSCOPIC RETROGRADE CHOLANGIOPANCREATOGRAPHY (ERCP);  Surgeon: Teena Irani, MD;  Location: [facility] ENDOSCOPY;  Service: Endoscopy;  Laterality: N/A;   HYSTEROTOMY     LEFT HEART CATH AND CORONARY ANGIOGRAPHY Left 04/13/2018   Procedure: LEFT HEART CATH AND CORONARY ANGIOGRAPHY;  Surgeon: Teodoro Spray, MD;  Location: [place] [facility];  Service: Cardiovascular;  Laterality: Left;   LEFT HEART CATH AND CORONARY ANGIOGRAPHY Left 12/25/2019   Procedure: LEFT HEART CATH AND CORONARY ANGIOGRAPHY;  Surgeon: Teodoro Spray, MD;  Location: [place] [facility];  Service: Cardiovascular;  Laterality: Left;   HPI:  Pt is a  66 year old female with a past medical history significant for hypertension, Tobacco abuse, hyperlipidemia, prior stroke with right-sided weakness (no residual deficits) migraines, and bipolar disorder who presented to [facility] ED on 05/24/2022 due to acute onset of near syncope with slurred speech and right arm and right leg weakness.     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  Last known well time was 3:45 PM she reported she had had a stroke in the past with similar symptoms which could completely resolved.  She has had a headache for couple of days which is unlike her old migraines on the right side, but had not had any focal deficits.  IV TNKase was administered at 1725 hrs per Neurology eval: "suspicion is that this is likely a stroke mimic-complex migraine but a small stroke given her risk factors cannot be ruled out".   Head CT; negative. CXR negaitive.    Assessment / Plan / Recommendation  Clinical Impression   Pt seen for BSE this morning. Pt reported not having slept last night d/t Neuro checks post IV TNKase given.  Pt awake, verbal and A/O x4. She engaged in conversation appropriately w/ this Clinician and NSG.  Pt appears to present w/ adequate oropharyngeal phase swallow function w/ No oropharyngeal phase dysphagia noted, No neuromuscular deficits noted. Pt consumed po trials w/ No immediate, overt, clinical s/s of aspiration during po trials. Pt appears at reduced risk for aspiration following general aspiration precautions.   However, pt does have challenging factors that could impact her oropharyngeal swallowing to include suspected new CVA/old CVA,  fatigue/weakness, and hospitalization. These factors can increase risk for aspiration, dysphagia as well as decreased oral intake overall.   During po trials, pt consumed all consistencies w/ no overt coughing, decline in vocal quality, or change in respiratory presentation during/post trials. O2 sats  remained 98%. Oral phase appeared [facility] w/ timely bolus management, mastication, and control of bolus propulsion for A-P transfer for swallowing. Oral clearing achieved w/ all trial consistencies -- moistened, soft foods given.  OM Exam appeared [facility] w/ no unilateral weakness noted. Upper Denture plate; no lower dentition baseline. Speech clear, intelligible. Pt fed self w/ min setup support given.   Recommend a fairly Regular consistency diet w/ well-Cut meats, moistened foods as is her baseline; Thin liquids -- monitor any straw use. Recommend general aspiration precautions, tray setup as needed currently. Pills WHOLE in Puree for safer, easier swallowing d/t c/o "large" Pills.  Education given on Pills in Puree; food consistencies and easy to eat options; general aspiration precautions to pt and Dtr. NSG to reconsult if any new Swallowing needs arise. NSG updated, agreed. MD updated. Recommend Dietician f/u for support. ADDENDUM: ST services will monitor/assess pt's speech next 1-2 days for any needs in setting of recent TNKase and <24 hours admitted. SLP Visit Diagnosis: Dysphagia, unspecified (R13.10)    Aspiration Risk   (reduced following general precs)    Diet Recommendation   a fairly Regular consistency diet w/ well-Cut meats, moistened foods as is her baseline; Thin liquids -- monitor any straw use. Recommend general aspiration precautions, tray setup as needed currently.   Medication Administration: Whole meds with puree (as needed for safer swallowing while in hospital)    Other  Recommendations Recommended Consults:  (n/a) Oral Care Recommendations: Oral care BID;Oral care before and after PO;Patient independent with oral care Other Recommendations:  (n/a)    Recommendations for follow up therapy are one component of a multi-disciplinary discharge planning process, led by the attending physician.  Recommendations may be updated based on patient status, additional functional criteria and  insurance authorization.  Follow up Recommendations No SLP follow up      Assistance Recommended at Discharge None  Functional Status Assessment Patient has had a recent decline in their functional status and demonstrates the ability to make significant improvements in function in a reasonable and predictable amount of time.  Frequency and Duration  (n/a)   (n/a)       Prognosis Prognosis for Safe Diet Advancement: Good Barriers to Reach Goals: Time post onset;Severity of deficits      Swallow Study   General Date of Onset: 05/24/22 HPI: Pt is a 66 year old female with a past medical history significant for hypertension, Tobacco abuse, hyperlipidemia, prior stroke with right-sided weakness (no residual deficits) migraines, and bipolar disorder who presented to [facility] ED on 05/24/2022 due to acute onset of near syncope with slurred speech and right arm and right leg weakness.     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  Last known well time was 3:45 PM she reported she had had a stroke in the past with similar symptoms which could completely resolved.  She has had a headache for couple of days which is unlike her old migraines on the right side, but had not had any focal deficits.  IV TNKase was administered at 1725 hrs per Neurology eval: "suspicion is that this is likely a stroke mimic-complex migraine but a small stroke given her risk factors cannot be ruled out".  Head CT; negative. CXR negaitive. Type of Study: Bedside Swallow Evaluation Previous Swallow Assessment: 2016 - admit w/ toxic lithium levels Diet Prior to this Study: NPO (Regluar diet at home) Temperature Spikes Noted: No (wbc 5.8) Respiratory Status: Room air History of Recent Intubation: No Behavior/Cognition: Alert;Cooperative;Pleasant mood Oral Cavity Assessment: Within Functional Limits Oral Care Completed by SLP: Yes Oral Cavity - Dentition: Upper Dentures plate; no lower  dentition(baseline) Vision: Functional for self-feeding Self-Feeding Abilities: Able to feed self;Needs set up Patient Positioning: Upright in bed (cues) Baseline Vocal Quality: Normal Volitional Cough: Strong Volitional Swallow: Able to elicit    Oral/Motor/Sensory Function Overall Oral Motor/Sensory Function: Within functional limits   Ice Chips Ice chips: Within functional limits Presentation: Spoon (fed; 2 trials)   Thin Liquid Thin Liquid: Within functional limits Presentation: Cup;Self Fed;Straw (~8 ozs)    Nectar Thick Nectar Thick Liquid: Not tested   Honey Thick Honey Thick Liquid: Not tested   Puree Puree: Within functional limits Presentation: Self Fed;Spoon (4 ozs)   Solid     Solid: Within functional limits Presentation: Self Fed (6 trials)        Orinda Kenner, MS, CCC-SLP Speech Language Pathologist Rehab Services; [place] [phone] (ascom) [place] 05/25/2022,4:30 PM

## 2022-05-25 NOTE — Progress Notes (Signed)
*  PRELIMINARY RESULTS* Echocardiogram 2D Echocardiogram has been performed.  Sherrie Sport 05/25/2022, 1:10 PM

## 2022-05-25 NOTE — Progress Notes (Signed)
OT Cancellation Note  Patient Details Name: Carol [name] MRN: [id number] DOB: 1956/04/22   Cancelled Treatment:    Reason Eval/Treat Not Completed: Patient not medically ready. Consult received and chart reviewed.  Patient admitted to ICU for CVA work up with TNK infusion (05/24/22, 1725).  Per guidelines, to be on strict bedrest x24 hours post infusion and follow up imaging required to be completed prior to initiation of therapy.  Will continue to follow and initiate as medically appropriate.  Dessie Coma, M.S. OTR/L  05/25/22, 8:29 AM  ascom [phone]

## 2022-05-25 NOTE — Progress Notes (Signed)
Neurology progress note  S: Patient received TNK yesterday evening for focal R sided weakness. Her deficits have almost completely resolved though R sided strength is effort-dependent. She is unable to undergo MRI 2/2 device.  O:  Vitals:   05/25/22 1738 05/25/22 1800  BP:  92/81  Pulse: (!) 53 (!) 56  Resp: 19 15  Temp:    SpO2:  98%    Physical Exam Gen: A&Ox4, NAD HEENT: Atraumatic, normocephalic; oropharynx clear, tongue without atrophy or fasciculations. Resp: CTAB, normal work of breathing CV: RRR, extremities appear well-perfused. Abd: soft/NT/ND Extrem: Nml bulk; no cyanosis, clubbing, or edema.  Neuro: *MS: A&O x4. Follows multi-step commands.  *Speech: no dysarthria or aphasia, able to name and repeat. *CN:    I: Deferred   II,III: PERRLA, VFF by confrontation, optic discs not visualized 2/2 pupillary constriction   III,IV,VI: EOMI w/o nystagmus, no ptosis   V: Sensation intact from V1 to V3 to LT   VII: Eyelid closure was full.  R NLF flattening.   VIII: Hearing intact to voice   IX,X: Voice normal, palate elevates symmetrically    XI: SCM/trap 5/5 bilat   XII: Tongue protrudes midline, no atrophy or fasciculations  *Motor:   Normal bulk.  No tremor, rigidity or bradykinesia. Drift in RUE and RLE nearly resolves with coaching. LUE and LLE full strength *Sensory: Intact to light touch, pinprick, temperature vibration throughout. Symmetric. Propioception intact bilat.  No double-simultaneous extinction.  *Coordination:  Finger-to-nose, heel-to-shin, rapid alternating motions were intact. *Reflexes:  2+ and symmetric throughout without clonus; toes down-going bilat *Gait: deferred  NIHSS = 3 for RLE and RUE drift, R NLF flattening  A/P: Acute ischemic stroke versus a stroke mimic status post IV TNKase Prior history of bipolar affective disorder, coronary artery disease, hyperlipidemia, hypertension, hypothyroidism, stroke and tobacco abuse.   Post TNK vitals and  neurochecks Systolic blood pressure goal less than 180 at all times Use as needed labetalol or hydralazine and if continuous infusion is required, use Cleviprex Telemetry 2D echo A1c Lipid panel PT OT Speech therapy General medical management per the critical care team  Will continue to follow  Su Monks, MD [facility] [phone]  If 7pm- 7am, please page neurology on call as listed in [place].

## 2022-05-25 NOTE — Progress Notes (Signed)
NAME:  Carol [name], MRN:  [id number], DOB:  07-18-1956, LOS: 1 ADMISSION DATE:  05/24/2022, CONSULTATION DATE:  05/24/2022 REFERRING MD:  Dr. Cinda Quest, CHIEF COMPLAINT:  Right sided weakness, slurred speech   Brief Pt Description / Synopsis:  66 y.o. Female admitted with Acute CVA status post TNKase.  History of Present Illness:  Carol [name] is a 66 year old female with a past medical history significant for hypertension, hyperlipidemia, prior stroke with right-sided weakness (no residual deficits) migraines, and bipolar disorder who presented to [facility] ED on 05/24/2022 due to acute onset of near syncope with slurred speech and right arm and right leg weakness.  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  Last known well time was 3:45 PM she reported she had had a stroke in the past with similar symptoms which could completely resolved.  She has had a headache for couple of days which is unlike her old migraines on the right side, but had not had any focal deficits.  She denied chest pain, shortness of breath, cough, abdominal pain, nausea, vomiting, diarrhea, dysuria.  She is not on outpatient anticoagulants.  ED Course: Initial Vital Signs: Temperature 98 Fahrenheit orally, respiratory rate 16, pulse 69, blood pressure 144/64, SPO2 100% on room air Significant Labs: Hemoglobin 10.4, hematocrit 32.8, ethyl alcohol less than 10 Imaging CT head without contrast>>IMPRESSION: 1. Stable head CT, no acute intracranial process. CTA head and neck>>IMPRESSION: 1. No intracranial large vessel occlusion. Mild stenosis in the right supraclinoid ICA. 2. No hemodynamically significant stenosis in the neck. Medications Administered: TNKase at 17:25  She was evaluated by neurology, decision made to proceed with thrombolytic therapy.  PCCM is asked admit to ICU for further work-up and treatment.  Please see "significant hospital events" section below for full detailed  hospital course.   Pertinent  Medical History   Past Medical History:  Diagnosis Date   Bipolar affective disorder ([place])    Coronary artery disease    Hyperlipidemia    Hypertension    Hypothyroid    Myocardial infarction [facility])    Stroke ([place]) 11/15/2016   "right side weaker since" (12/20/2016)     Micro Data:  N/A  Antimicrobials:  N/A  Significant Hospital Events: Including procedures, antibiotic start and stop dates in addition to other pertinent events   10/9: Presented to ED with strokelike symptoms, evaluated by neurology and received TNKase.  PCCM asked to admit to ICU.  Interim History / Subjective:  -Afebrile, hemodynamically stable, blood pressure controlled, currently not requiring any IV infusions for hypertension -Status post taking ice at 17:25; patient reports symptoms are improving -Still noted to have some very mild weakness to the right arm and right leg -  Objective   Blood pressure (!) 137/57, pulse (!) 51, temperature 97.8 F (36.6 C), temperature source Oral, resp. rate 17, height 5' (1.524 m), weight 62.8 kg, SpO2 100 %.        Intake/Output Summary (Last 24 hours) at 05/25/2022 0942 Last data filed at 05/25/2022 0600 Gross per 24 hour  Intake 570 ml  Output 300 ml  Net 270 ml    Filed Weights   05/24/22 1714 05/24/22 2100  Weight: 63.5 kg 62.8 kg    Examination: General: Acutely ill-appearing female, sitting in bed, on room air, no acute distress HENT: Atraumatic, normocephalic, neck supple, no JVD Lungs: Clear breath sounds throughout, even, nonlabored Cardiovascular: Bradycardia, regular rhythm, S1-S2, no murmurs, rubs, gallops Abdomen: Soft, nontender, nondistended, no guarding  rebound tenderness, bowel sounds positive x4 Extremities: Normal bulk and tone, no deformities, no edema Neuro: Awake and alert, oriented x4, moves all extremities to command, mild weakness to right upper and lower extremities, pupils PERRLA, speech  clear GU: Deferred  Resolved Hospital Problem list     Assessment & Plan:   Acute ischemic CVA, status post TNKase on 10/9 PMHx: Prior stroke, migraines, bipolar disorder CT head without contrast on admission negative for acute intracranial abnormality CTA head neck without large intracranial vessel occlusion, no significant neck stenosis -ICU/Cardiac monitoring -Frequent neurochecks and vital signs as per TNK protocol -Strict blood pressure control: Goal SBP <180 and DBP<105 -As needed hydralazine and Cleviprex drip if needed to maintain goal blood pressure -Repeat CT head in 24 hours (unable to obtain MRI due to stimulator) -Obtain 2D echocardiogram -Check hemoglobin A1c and lipid panel -Avoid aspirin and anticoagulations for 24 hours -PT/OT/speech therapy consults -Obtain chest x-ray, urinalysis, urine drug screen -Neurology consulted, appreciate input -Avoid NSAIDs for headache, neurology has ordered Compazine for migraines (recommends addition of Benadryl, Reglan, Compazine or even a one-time dose of Solu-Medrol 500 mg if another agent needed)   Best Practice (right click and "Reselect all SmartList Selections" daily)   Diet/type: NPO DVT prophylaxis: SCD GI prophylaxis: PPI Lines: N/A Foley:  N/A Code Status:  full code Last date of multidisciplinary goals of care discussion [N/A]  Patient and her spouse updated at bedside.  All questions answered.  Labs   CBC: Recent Labs  Lab 05/24/22 1752 05/25/22 0359  WBC 6.3 5.8  NEUTROABS 3.7  --   HGB 10.4* 10.4*  HCT 32.8* 32.8*  MCV 93.4 92.7  PLT 211 184     Basic Metabolic Panel: Recent Labs  Lab 05/24/22 1752 05/25/22 0359  NA 136 141  K 4.8 3.6  CL 107 111  CO2 25 25  GLUCOSE 94 96  BUN 13 12  CREATININE 0.91 0.72  CALCIUM 9.2 9.0  MG  --  2.1  PHOS  --  3.4    GFR: Estimated Creatinine Clearance: 55.7 mL/min (by C-G formula based on SCr of 0.72 mg/dL). Recent Labs  Lab 05/24/22 1752  05/25/22 0359  WBC 6.3 5.8     Liver Function Tests: Recent Labs  Lab 05/24/22 1752  AST 28  ALT 12  ALKPHOS 71  BILITOT 1.1  PROT 6.9  ALBUMIN 3.7    No results for input(s): "LIPASE", "AMYLASE" in the last 168 hours. No results for input(s): "AMMONIA" in the last 168 hours.  ABG No results found for: "PHART", "PCO2ART", "PO2ART", "HCO3", "TCO2", "ACIDBASEDEF", "O2SAT"   Coagulation Profile: Recent Labs  Lab 05/24/22 1842 05/25/22 0359  INR 1.0 1.1     Cardiac Enzymes: No results for input(s): "CKTOTAL", "CKMB", "CKMBINDEX", "TROPONINI" in the last 168 hours.  HbA1C: Hemoglobin A1C  Date/Time Value Ref Range Status  06/26/2019 04:39 PM 5.4 4.0 - 5.6 % Final   Hgb A1c MFr Bld  Date/Time Value Ref Range Status  02/27/2015 05:17 AM 5.2 4.0 - 6.0 % Final    CBG: Recent Labs  Lab 05/24/22 1657 05/24/22 2105  GLUCAP 93 93     Review of Systems:   Positives in BOLD: Gen: Denies fever, chills, weight change, fatigue, night sweats HEENT: Denies blurred vision, double vision, hearing loss, tinnitus, sinus congestion, rhinorrhea, sore throat, neck stiffness, dysphagia PULM: Denies shortness of breath, cough, sputum production, hemoptysis, wheezing CV: Denies chest pain, edema, orthopnea, paroxysmal nocturnal dyspnea, palpitations GI: Denies  abdominal pain, nausea, vomiting, diarrhea, hematochezia, melena, constipation, change in bowel habits GU: Denies dysuria, hematuria, polyuria, oliguria, urethral discharge Endocrine: Denies hot or cold intolerance, polyuria, polyphagia or appetite change Derm: Denies rash, dry skin, scaling or peeling skin change Heme: Denies easy bruising, bleeding, bleeding gums Neuro: Denies headache, numbness, weakness, slurred speech, loss of memory or consciousness   Past Medical History:  She,  has a past medical history of Bipolar affective disorder ([place]), Coronary artery disease, Hyperlipidemia, Hypertension, Hypothyroid,  Myocardial infarction [facility]), and Stroke ([place]) (11/15/2016).   Surgical History:   Past Surgical History:  Procedure Laterality Date   ABDOMINAL HYSTERECTOMY     APPENDECTOMY     CARDIAC CATHETERIZATION Left 09/26/2015   Procedure: Left Heart Cath and Coronary Angiography;  Surgeon: Teodoro Spray, MD;  Location: [place] CV [facility];  Service: Cardiovascular;  Laterality: Left;   CHOLECYSTECTOMY N/A 12/22/2016   Procedure: LAPAROSCOPIC CHOLECYSTECTOMY WITH INTRAOPERATIVE CHOLANGIOGRAM;  Surgeon: Jovita Kussmaul, MD;  Location: [place];  Service: General;  Laterality: N/A;   CORONARY STENT INTERVENTION N/A 04/13/2018   Procedure: CORONARY STENT INTERVENTION;  Surgeon: Yolonda Kida, MD;  Location: [place] [facility];  Service: Cardiovascular;  Laterality: N/A;   ERCP N/A 12/20/2016   Procedure: ENDOSCOPIC RETROGRADE CHOLANGIOPANCREATOGRAPHY (ERCP);  Surgeon: Teena Irani, MD;  Location: [facility] ENDOSCOPY;  Service: Endoscopy;  Laterality: N/A;   HYSTEROTOMY     LEFT HEART CATH AND CORONARY ANGIOGRAPHY Left 04/13/2018   Procedure: LEFT HEART CATH AND CORONARY ANGIOGRAPHY;  Surgeon: Teodoro Spray, MD;  Location: [place] [facility];  Service: Cardiovascular;  Laterality: Left;   LEFT HEART CATH AND CORONARY ANGIOGRAPHY Left 12/25/2019   Procedure: LEFT HEART CATH AND CORONARY ANGIOGRAPHY;  Surgeon: Teodoro Spray, MD;  Location: [place] CV LAB;  Service: Cardiovascular;  Laterality: Left;     Social History:   reports that she has been smoking cigarettes. She has a 22.50 pack-year smoking history. She has never used smokeless tobacco. She reports that she does not currently use alcohol. She reports current drug use.   Family History:  Her family history includes CAD in an other family member.   Allergies Allergies  Allergen Reactions   Morphine Other (See Comments)    Hallucinations    Sulfa Antibiotics Hives   Sulfamethoxazole-Trimethoprim Swelling   Azithromycin Diarrhea and Nausea  And Vomiting    Burn stomach   Pregabalin Other (See Comments)    Pt states that she was told not to take this medication.     Asa [Aspirin] Hives, Nausea Only and Other (See Comments)    Burning of stomach    Rofecoxib Other (See Comments)    Burning of stomach      Home Medications  Prior to Admission medications   Medication Sig Start Date End Date Taking? Authorizing Provider  acetaminophen (TYLENOL) 500 MG tablet Take 1,000 mg by mouth every 6 (six) hours as needed for mild pain or headache.    [provider]  albuterol (PROVENTIL HFA;VENTOLIN HFA) 108 (90 BASE) MCG/ACT inhaler Inhale 2 puffs into the lungs every 6 (six) hours as needed for wheezing or shortness of breath.    [provider]  albuterol (PROVENTIL) (2.5 MG/3ML) 0.083% nebulizer solution USE 1 VIAL VIA NEBULIZER  EVERY 4 TO 6 HOURS AS  NEEDED Patient taking differently: Take 2.5 mg by nebulization every 4 (four) hours as needed for wheezing or shortness of breath. 09/07/17   Lavera Guise, MD  atorvastatin (LIPITOR) 10 MG tablet Take 1 tablet (10 mg total) by mouth daily. Take 1 tab po daily 10/19/21   Jonetta Osgood, NP  Calcium Carbonate-Vitamin D 600-200 MG-UNIT CAPS Take 1 tablet by mouth daily.     [provider]  cetirizine (ZYRTEC) 10 MG tablet Take 1 tablet (10 mg total) by mouth daily. 10/19/21   Jonetta Osgood, NP  citalopram (CELEXA) 40 MG tablet TAKE 1 TABLET BY MOUTH  DAILY 05/03/21   Lavera Guise, MD  clopidogrel (PLAVIX) 75 MG tablet Take 75 mg by mouth daily.     [provider]  clopidogrel (PLAVIX) 75 MG tablet Take 1 tablet by mouth daily. 06/19/21   [provider]  diltiazem (CARDIZEM CD) 120 MG 24 hr capsule Take 1 capsule (120 mg total) by mouth daily. 10/19/21   Jonetta Osgood, NP  diltiazem (CARDIZEM) 30 MG tablet Take 30 mg by mouth daily as needed (Heart palpitations).  10/25/19   [provider]  fluticasone (FLONASE) 50 MCG/ACT nasal  spray Place 1 spray into both nostrils daily. 10/19/21   Jonetta Osgood, NP  isosorbide mononitrate (IMDUR) 30 MG 24 hr tablet Take 1 tablet (30 mg total) by mouth daily. 10/19/21   Jonetta Osgood, NP  levothyroxine (SYNTHROID) 100 MCG tablet TAKE 1 TABLET BY MOUTH DAILY 12/14/21   Lavera Guise, MD  lisinopril (ZESTRIL) 10 MG tablet TAKE 1 TABLET BY MOUTH DAILY 05/17/22   Lavera Guise, MD  metoprolol tartrate (LOPRESSOR) 25 MG tablet Take 12.5 mg by mouth daily.     [provider]  nitroGLYCERIN (NITROSTAT) 0.4 MG SL tablet Place 1 tablet (0.4 mg total) under the tongue every 5 (five) minutes as needed for chest pain. 10/19/21   Jonetta Osgood, NP  ondansetron (ZOFRAN-ODT) 4 MG disintegrating tablet Take 1 tablet (4 mg total) by mouth every 8 (eight) hours as needed for nausea or vomiting. 11/27/19   Ronnell Freshwater, NP  pantoprazole (PROTONIX) 40 MG tablet Take 1 tablet (40 mg total) by mouth 2 (two) times daily. 10/19/21   Jonetta Osgood, NP  rosuvastatin (CRESTOR) 10 MG tablet Take 10 mg by mouth at bedtime. 08/10/21   [provider]  SYRINGE/NEEDLE, DISP, 1 ML 23G X 1" 1 ML MISC 1 each by Does not apply route once a week. 07/26/19   Kendell Bane, NP     Critical care provider statement:   Total critical care time: 33 minutes   Performed by: Lanney Gins MD   Critical care time was exclusive of separately billable procedures and treating other patients.   Critical care was necessary to treat or prevent imminent or life-threatening deterioration.   Critical care was time spent personally by me on the following activities: development of treatment plan with patient and/or surrogate as well as nursing, discussions with consultants, evaluation of patient's response to treatment, examination of patient, obtaining history from patient or surrogate, ordering and performing treatments and interventions, ordering and review of laboratory studies, ordering and review of  radiographic studies, pulse oximetry and re-evaluation of patient's condition.    Ottie Glazier, M.D.  Pulmonary & Critical Care Medicine

## 2022-05-26 LAB — CBC
HCT: 32.2 % — ABNORMAL LOW (ref 36.0–46.0)
Hemoglobin: 10.2 g/dL — ABNORMAL LOW (ref 12.0–15.0)
MCH: 29.2 pg (ref 26.0–34.0)
MCHC: 31.7 g/dL (ref 30.0–36.0)
MCV: 92.3 fL (ref 80.0–100.0)
Platelets: 171 10*3/uL (ref 150–400)
RBC: 3.49 MIL/uL — ABNORMAL LOW (ref 3.87–5.11)
RDW: 13.3 % (ref 11.5–15.5)
WBC: 5.7 10*3/uL (ref 4.0–10.5)
nRBC: 0 % (ref 0.0–0.2)

## 2022-05-26 LAB — MAGNESIUM: Magnesium: 2.1 mg/dL (ref 1.7–2.4)

## 2022-05-26 LAB — BASIC METABOLIC PANEL
Anion gap: 3 — ABNORMAL LOW (ref 5–15)
BUN: 10 mg/dL (ref 8–23)
CO2: 22 mmol/L (ref 22–32)
Calcium: 8.6 mg/dL — ABNORMAL LOW (ref 8.9–10.3)
Chloride: 116 mmol/L — ABNORMAL HIGH (ref 98–111)
Creatinine, Ser: 0.62 mg/dL (ref 0.44–1.00)
GFR, Estimated: >60 mL/min (ref >60)
Glucose, Bld: 90 mg/dL (ref 70–99)
Potassium: 4 mmol/L (ref 3.5–5.1)
Sodium: 141 mmol/L (ref 135–145)

## 2022-05-26 LAB — PHOSPHORUS: Phosphorus: 3.3 mg/dL (ref 2.5–4.6)

## 2022-05-26 MED ORDER — ATORVASTATIN CALCIUM 40 MG PO TABS: 40.0000 mg | ORAL_TABLET | Freq: Every day | ORAL | 1 refills | Status: DC

## 2022-05-26 NOTE — Discharge Summary (Signed)
Carol [name] EXH:[id number] DOB: 06/13/1956 DOA: 05/24/2022  PCP: Jonetta Osgood, NP  Admit date: 05/24/2022 Discharge date: 05/26/2022  Time spent: 35 minutes  Recommendations for Outpatient Follow-up:  Pcp f/u Neurology f/u     Discharge Diagnoses:  Principal Problem:   CVA (cerebral vascular accident) [facility]) Active Problems:   Bipolar disorder ([place])   Coronary artery disease   Essential hypertension   Hypothyroidism   History of stroke   Tobacco use   Discharge Condition: stable  Diet recommendation: heart healthy  Filed Weights   05/24/22 1714 05/24/22 2100  Weight: 63.5 kg 62.8 kg    History of present illness:  From admission h and p Carol [name] is a 66 year old female with a past medical history significant for hypertension, hyperlipidemia, prior stroke with right-sided weakness (no residual deficits) migraines, and bipolar disorder who presented to [facility] ED on 05/24/2022 due to acute onset of near syncope with slurred speech and right arm and right leg weakness.   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  Last known well time was 3:45 PM she reported she had had a stroke in the past with similar symptoms which could completely resolved.  She has had a headache for couple of days which is unlike her old migraines on the right side, but had not had any focal deficits.  She denied chest pain, shortness of breath, cough, abdominal pain, nausea, vomiting, diarrhea, dysuria.  She is not on outpatient anticoagulants.    Hospital Course:  Hx CVA, presenting with right-sided weakness and slurred speech. Unable to perform MRI 2/2 device. Neuroimaging unremarkable but treated for presumed ischemic stroke with TNK. Neuro deficits resolved. Evaluated by pt/ot, will order home health PT. TTE unremarkable. Neuro OK with discharge, will increase statin given ldl of 105. Referred to outpatient neurology, will also need pcp  f/u.  Procedures: none   Consultations: neurology  Discharge Exam: Vitals:   05/26/22 0700 05/26/22 0730  BP: (!) 135/97 (!) 144/58  Pulse: (!) 49 (!) 51  Resp: 15 13  Temp:    SpO2: 96% 96%    General: NAD Cardiovascular: RRR Respiratory: CTAB Neuro: non-focal  Discharge Instructions   Discharge Instructions     Ambulatory referral to Neurology   Complete by: As directed    Diet - low sodium heart healthy   Complete by: As directed    Increase activity slowly   Complete by: As directed       Allergies as of 05/26/2022       Reactions   Morphine Other (See Comments)   Hallucinations    Sulfa Antibiotics Hives   Sulfamethoxazole-trimethoprim Swelling   Azithromycin Diarrhea, Nausea And Vomiting   Burn stomach   Pregabalin Other (See Comments)   Pt states that she was told not to take this medication.     Asa [aspirin] Hives, Nausea Only, Other (See Comments)   Burning of stomach    Rofecoxib Other (See Comments)   Burning of stomach         Medication List     TAKE these medications    acetaminophen 500 MG tablet Commonly known as: TYLENOL Take 1,000 mg by mouth every 6 (six) hours as needed for mild pain or headache.   albuterol (2.5 MG/3ML) 0.083% nebulizer solution Commonly known as: PROVENTIL USE 1 VIAL VIA NEBULIZER  EVERY 4 TO 6 HOURS AS  NEEDED What changed: See the new instructions.   atorvastatin 40 MG tablet Commonly known  as: LIPITOR Take 1 tablet (40 mg total) by mouth daily. Take 1 tab po daily What changed:  medication strength how much to take   Calcium Carbonate-Vitamin D 600-200 MG-UNIT Caps Take 1 tablet by mouth daily.   cetirizine 10 MG tablet Commonly known as: ZYRTEC Take 1 tablet (10 mg total) by mouth daily.   citalopram 40 MG tablet Commonly known as: CELEXA TAKE 1 TABLET BY MOUTH  DAILY   clopidogrel 75 MG tablet Commonly known as: PLAVIX Take 75 mg by mouth daily.   diltiazem 120 MG 24 hr  capsule Commonly known as: CARDIZEM CD Take 1 capsule (120 mg total) by mouth daily.   diltiazem 30 MG tablet Commonly known as: CARDIZEM Take 30 mg by mouth daily as needed (Heart palpitations).   fluticasone 50 MCG/ACT nasal spray Commonly known as: FLONASE Place 1 spray into both nostrils daily.   isosorbide mononitrate 30 MG 24 hr tablet Commonly known as: IMDUR Take 1 tablet (30 mg total) by mouth daily.   levothyroxine 100 MCG tablet Commonly known as: SYNTHROID TAKE 1 TABLET BY MOUTH DAILY   lisinopril 10 MG tablet Commonly known as: ZESTRIL TAKE 1 TABLET BY MOUTH DAILY   metoprolol tartrate 25 MG tablet Commonly known as: LOPRESSOR Take 12.5 mg by mouth daily.   nitroGLYCERIN 0.4 MG SL tablet Commonly known as: NITROSTAT Place 1 tablet (0.4 mg total) under the tongue every 5 (five) minutes as needed for chest pain.   pantoprazole 40 MG tablet Commonly known as: PROTONIX Take 1 tablet (40 mg total) by mouth 2 (two) times daily.   SYRINGE/NEEDLE (DISP) 1 ML 23G X 1" 1 ML Misc 1 each by Does not apply route once a week.       Allergies  Allergen Reactions   Morphine Other (See Comments)    Hallucinations    Sulfa Antibiotics Hives   Sulfamethoxazole-Trimethoprim Swelling   Azithromycin Diarrhea and Nausea And Vomiting    Burn stomach   Pregabalin Other (See Comments)    Pt states that she was told not to take this medication.     Asa [Aspirin] Hives, Nausea Only and Other (See Comments)    Burning of stomach    Rofecoxib Other (See Comments)    Burning of stomach     Follow-up Information     Jonetta Osgood, NP Follow up.   Specialty: Nurse Practitioner Contact information: [address] [place] [address] [phone]                  The results of significant diagnostics from this hospitalization (including imaging, microbiology, ancillary and laboratory) are listed below for reference.    Significant Diagnostic  Studies: CT HEAD WO CONTRAST (5MM)  Result Date: 05/25/2022 CLINICAL DATA:  Stroke follow-up EXAM: CT HEAD WITHOUT CONTRAST TECHNIQUE: Contiguous axial images were obtained from the base of the skull through the vertex without intravenous contrast. RADIATION DOSE REDUCTION: This exam was performed according to the departmental dose-optimization program which includes automated exposure control, adjustment of the mA and/or kV according to patient size and/or use of iterative reconstruction technique. COMPARISON:  05/24/2022 FINDINGS: Brain: There is no mass, hemorrhage or extra-axial collection. The size and configuration of the ventricles and extra-axial CSF spaces are normal. Old left cerebellar infarct. Vascular: No abnormal hyperdensity of the major intracranial arteries or dural venous sinuses. No intracranial atherosclerosis. Skull: The visualized skull base, calvarium and extracranial soft tissues are normal. Sinuses/Orbits: No fluid levels or advanced mucosal thickening  of the visualized paranasal sinuses. No mastoid or middle ear effusion. The orbits are normal. IMPRESSION: 1. No acute intracranial abnormality. 2. Old left cerebellar infarct. Electronically Signed   By: Ulyses Jarred M.D.   On: 05/25/2022 18:17   ECHOCARDIOGRAM COMPLETE  Result Date: 05/25/2022    ECHOCARDIOGRAM REPORT   Patient Name:   Carol [name] Date of Exam: 05/25/2022 Medical Rec #:  [id number]    Height:       60.0 in Accession #:    [id number]   Weight:       138.4 lb Date of Birth:  [birth year]    BSA:          1.596 m Patient Age:    26 years     BP:           137/71 mmHg Patient Gender: F            HR:           54 bpm. Exam Location:  [facility] Procedure: 2D Echo, Cardiac Doppler and Color Doppler Indications:     Stroke I63.9  History:         Patient has prior history of Echocardiogram examinations, most                  recent 02/26/2015. Previous Myocardial Infarction, Stroke; Risk                  Factors:Hypertension and  Dyslipidemia.  Sonographer:     Sherrie Sport Referring Phys:  [id number] Bradly Bienenstock Diagnosing Phys: Nelva Bush MD  Sonographer Comments: Suboptimal apical window. IMPRESSIONS  1. Left ventricular ejection fraction, by estimation, is 65 to 70%. The left ventricle has normal function. The left ventricle has no regional wall motion abnormalities. There is mild left ventricular hypertrophy. Left ventricular diastolic parameters are consistent with Grade II diastolic dysfunction (pseudonormalization).  2. Right ventricular systolic function is normal. The right ventricular size is normal. There is normal pulmonary artery systolic pressure.  3. The mitral valve is degenerative. Mild mitral valve regurgitation. No evidence of mitral stenosis.  4. The aortic valve was not well visualized. There is mild calcification of the aortic valve. There is moderate thickening of the aortic valve. Aortic valve regurgitation is not visualized. No aortic stenosis is present.  5. The inferior vena cava is normal in size with <50% respiratory variability, suggesting right atrial pressure of 8 mmHg. FINDINGS  Left Ventricle: Left ventricular ejection fraction, by estimation, is 65 to 70%. The left ventricle has normal function. The left ventricle has no regional wall motion abnormalities. The left ventricular internal cavity size was normal in size. There is  mild left ventricular hypertrophy. Left ventricular diastolic parameters are consistent with Grade II diastolic dysfunction (pseudonormalization). Right Ventricle: The right ventricular size is normal. No increase in right ventricular wall thickness. Right ventricular systolic function is normal. There is normal pulmonary artery systolic pressure. The tricuspid regurgitant velocity is 2.61 m/s, and  with an assumed right atrial pressure of 8 mmHg, the estimated right ventricular systolic pressure is 70.9 mmHg. Left Atrium: Left atrial size was normal in size. Right Atrium: Right  atrial size was normal in size. Pericardium: There is no evidence of pericardial effusion. Presence of epicardial fat layer. Mitral Valve: The mitral valve is degenerative in appearance. There is mild thickening of the mitral valve leaflet(s). Mild mitral valve regurgitation. No evidence of mitral valve stenosis. Tricuspid Valve: The tricuspid valve is not well  visualized. Tricuspid valve regurgitation is mild. Aortic Valve: The aortic valve was not well visualized. There is mild calcification of the aortic valve. There is moderate thickening of the aortic valve. Aortic valve regurgitation is not visualized. No aortic stenosis is present. Aortic valve mean gradient measures 4.0 mmHg. Aortic valve peak gradient measures 8.9 mmHg. Aortic valve area, by VTI measures 3.85 cm. Pulmonic Valve: The pulmonic valve was not well visualized. Pulmonic valve regurgitation is not visualized. No evidence of pulmonic stenosis. Aorta: The aortic root is normal in size and structure. Pulmonary Artery: The pulmonary artery is not well seen. Venous: The inferior vena cava is normal in size with less than 50% respiratory variability, suggesting right atrial pressure of 8 mmHg. IAS/Shunts: The interatrial septum was not assessed.  LEFT VENTRICLE PLAX 2D LVIDd:         4.00 cm   Diastology LVIDs:         2.80 cm   LV e' medial:    7.29 cm/s LV PW:         1.10 cm   LV E/e' medial:  16.3 LV IVS:        1.00 cm   LV e' lateral:   10.00 cm/s LVOT diam:     1.90 cm   LV E/e' lateral: 11.9 LV SV:         119 LV SV Index:   74 LVOT Area:     2.84 cm  RIGHT VENTRICLE RV Basal diam:  3.50 cm RV Mid diam:    2.60 cm RV S prime:     11.00 cm/s TAPSE (M-mode): 2.6 cm LEFT ATRIUM             Index        RIGHT ATRIUM          Index LA diam:        2.90 cm 1.82 cm/m   RA Area:     8.64 cm LA Vol (A2C):   46.3 ml 29.00 ml/m  RA Volume:   14.40 ml 9.02 ml/m LA Vol (A4C):   29.0 ml 18.16 ml/m LA Biplane Vol: 37.6 ml 23.55 ml/m  AORTIC VALVE AV  Area (Vmax):    3.01 cm AV Area (Vmean):   3.54 cm AV Area (VTI):     3.85 cm AV Vmax:           149.00 cm/s AV Vmean:          94.400 cm/s AV VTI:            0.308 m AV Peak Grad:      8.9 mmHg AV Mean Grad:      4.0 mmHg LVOT Vmax:         158.00 cm/s LVOT Vmean:        118.000 cm/s LVOT VTI:          0.418 m LVOT/AV VTI ratio: 1.36  AORTA Ao Root diam: 2.50 cm MITRAL VALVE                TRICUSPID VALVE MV Area (PHT): 3.60 cm     TR Peak grad:   27.2 mmHg MV Decel Time: 211 msec     TR Vmax:        261.00 cm/s MV E velocity: 119.00 cm/s MV A velocity: 92.20 cm/s   SHUNTS MV E/A ratio:  1.29         Systemic VTI:  0.42 m  Systemic Diam: 1.90 cm Nelva Bush MD Electronically signed by Nelva Bush MD Signature Date/Time: 05/25/2022/3:44:04 PM    Final    DG Chest Port 1 View  Result Date: 05/25/2022 CLINICAL DATA:  66 year old female code stroke presentation yesterday. EXAM: PORTABLE CHEST 1 VIEW COMPARISON:  Chest radiographs 11/25/2019. FINDINGS: Portable AP upright view at 0538 hours. Lung volumes and mediastinal contours are within normal limits. Visualized tracheal air column is within normal limits. Allowing for portable technique the lungs are clear. No pneumothorax or pleural effusion. No acute osseous abnormality identified. Negative visible bowel gas. IMPRESSION: Negative portable chest. Electronically Signed   By: Genevie Ann M.D.   On: 05/25/2022 08:36   CT ANGIO HEAD NECK W WO CM (CODE STROKE)  Result Date: 05/24/2022 CLINICAL DATA:  Right-sided extremity weakness, slurred speech, stroke suspected EXAM: CT ANGIOGRAPHY HEAD AND NECK TECHNIQUE: Multidetector CT imaging of the head and neck was performed using the standard protocol during bolus administration of intravenous contrast. Multiplanar CT image reconstructions and MIPs were obtained to evaluate the vascular anatomy. Carotid stenosis measurements (when applicable) are obtained utilizing NASCET criteria,  using the distal internal carotid diameter as the denominator. RADIATION DOSE REDUCTION: This exam was performed according to the departmental dose-optimization program which includes automated exposure control, adjustment of the mA and/or kV according to patient size and/or use of iterative reconstruction technique. CONTRAST:  74m OMNIPAQUE IOHEXOL 350 MG/ML SOLN COMPARISON:  No prior CTA, correlation is made with CT head 05/24/2022 FINDINGS: CT HEAD FINDINGS For noncontrast findings, please see same day CT head. CTA NECK FINDINGS Aortic arch: Two-vessel arch with a common origin of the brachiocephalic and left common carotid arteries. Imaged portion shows no evidence of aneurysm or dissection. No significant stenosis of the major arch vessel origins. Right carotid system: No evidence of dissection, occlusion, or hemodynamically significant stenosis (greater than 50%). Atherosclerotic disease at the bifurcation and in the proximal ICA is not hemodynamically significant. Left carotid system: No evidence of dissection, occlusion, or hemodynamically significant stenosis (greater than 50%). Atherosclerotic disease at the bifurcation and in the proximal ICA is not hemodynamically significant. Vertebral arteries: No evidence of dissection, occlusion, or hemodynamically significant stenosis (greater than 50%). Skeleton: No acute osseous abnormality.  Edentulous. Other neck: Negative. Upper chest: Negative. Review of the MIP images confirms the above findings CTA HEAD FINDINGS Anterior circulation: Both internal carotid arteries are patent to the termini, with mild stenosis in the right supraclinoid ICA. A1 segments patent. Normal anterior communicating artery. Anterior cerebral arteries are patent to their distal aspects. No M1 stenosis or occlusion. MCA branches perfused and symmetric. Posterior circulation: Vertebral arteries patent to the vertebrobasilar junction without stenosis. Left PICA and right AICA are patent.  Basilar patent to its distal aspect. Superior cerebellar arteries patent proximally. Patent P1 segments. PCAs perfused to their distal aspects without stenosis. The right posterior communicating artery is patent. Venous sinuses: As permitted by contrast timing, patent. Anatomic variants: None significant. Review of the MIP images confirms the above findings IMPRESSION: 1. No intracranial large vessel occlusion. Mild stenosis in the right supraclinoid ICA. 2. No hemodynamically significant stenosis in the neck. Code stroke imaging results were communicated on 05/24/2022 at 5:55 pm to provider Dr. ARory Percyvia secure text paging. Electronically Signed   By: AMerilyn BabaM.D.   On: 05/24/2022 17:55   CT HEAD CODE STROKE WO CONTRAST  Result Date: 05/24/2022 CLINICAL DATA:  Code stroke. Right-sided extremity weakness, slurred speech EXAM: CT HEAD WITHOUT CONTRAST TECHNIQUE:  Contiguous axial images were obtained from the base of the skull through the vertex without intravenous contrast. RADIATION DOSE REDUCTION: This exam was performed according to the departmental dose-optimization program which includes automated exposure control, adjustment of the mA and/or kV according to patient size and/or use of iterative reconstruction technique. COMPARISON:  12/31/2020 FINDINGS: Brain: No evidence of acute infarction, hemorrhage, cerebral edema, mass, mass effect, or midline shift. No hydrocephalus or extra-axial collection. Vascular: No hyperdense vessel. Skull: Negative for fracture or focal lesion. Sinuses/Orbits: Mild mucosal thickening in the left maxillary sinus. Other: Trace fluid in the right mastoid air cells. ASPECTS [facility] Stroke Program Early CT Score) - Ganglionic level infarction (caudate, lentiform nuclei, internal capsule, insula, M1-M3 cortex): 7 - Supraganglionic infarction (M4-M6 cortex): 3 Total score (0-10 with 10 being normal): 10 IMPRESSION: No acute intracranial abnormality. ASPECTS is 10. Code stroke  imaging results were communicated on 05/24/2022 at 5:13 pm to provider [facility] via telephone, who verbally acknowledged these results. Electronically Signed   By: Merilyn Baba M.D.   On: 05/24/2022 17:13    Microbiology: Recent Results (from the past 240 hour(s))  MRSA Next Gen by PCR, Nasal     Status: None   Collection Time: 05/24/22  9:30 PM   Specimen: Nasal Mucosa; Nasal Swab  Result Value Ref Range Status   MRSA by PCR Next Gen NOT DETECTED NOT DETECTED Final    Comment: (NOTE) The GeneXpert MRSA Assay (FDA approved for NASAL specimens only), is one component of a comprehensive MRSA colonization surveillance program. It is not intended to diagnose MRSA infection nor to guide or monitor treatment for MRSA infections. Test performance is not FDA approved in patients less than 41 years old. Performed at [facility], [address] [place], [place] [address]      Labs: Basic Metabolic Panel: Recent Labs  Lab 05/24/22 1752 05/25/22 0359 05/26/22 0406  NA 136 141 141  K 4.8 3.6 4.0  CL 107 111 116*  CO2 '25 25 22  '$ GLUCOSE 94 96 90  BUN '13 12 10  '$ CREATININE 0.91 0.72 0.62  CALCIUM 9.2 9.0 8.6*  MG  --  2.1 2.1  PHOS  --  3.4 3.3   Liver Function Tests: Recent Labs  Lab 05/24/22 1752  AST 28  ALT 12  ALKPHOS 71  BILITOT 1.1  PROT 6.9  ALBUMIN 3.7   No results for input(s): "LIPASE", "AMYLASE" in the last 168 hours. No results for input(s): "AMMONIA" in the last 168 hours. CBC: Recent Labs  Lab 05/24/22 1752 05/25/22 0359 05/26/22 0406  WBC 6.3 5.8 5.7  NEUTROABS 3.7  --   --   HGB 10.4* 10.4* 10.2*  HCT 32.8* 32.8* 32.2*  MCV 93.4 92.7 92.3  PLT 211 184 171   Cardiac Enzymes: No results for input(s): "CKTOTAL", "CKMB", "CKMBINDEX", "TROPONINI" in the last 168 hours. BNP: BNP (last 3 results) No results for input(s): "BNP" in the last 8760 hours.  ProBNP (last 3 results) No results for input(s): "PROBNP" in the last 8760  hours.  CBG: Recent Labs  Lab 05/24/22 1657 05/24/22 2105  GLUCAP 93 93       Signed:  Desma Maxim MD.  [facility] 05/26/2022, 2:21 PM

## 2022-05-26 NOTE — Evaluation (Signed)
Occupational Therapy Evaluation Patient Details Name: Carol [name] MRN: [id number] DOB: 1955/10/16 Today's Date: 05/26/2022   History of Present Illness 66 year old female with a past medical history significant for hypertension, hyperlipidemia, prior stroke with right-sided weakness (minimal residual deficits) migraines, and bipolar disorder who presented to [facility] ED on 05/24/2022 due to acute onset of near syncope with slurred speech and right arm and right leg weakness.  Imaging did not reveal actute insult, recieved TNK, symptoms had nearly resolved/returned to baseline at time of eval.   Clinical Impression   Carol [name] was seen for OT evaluation this date. Prior to hospital admission, pt was Independent for mobility and ADLs. Pt lives with family. Pt presents to acute OT demonstrating functional strength deficits but near baseline mobility. Pt currently requires SUPERVISION with no AD use for functional mobility and reaching outside BOS - intermittent single UE support on furntiure with good safety awareness. 4-/5 R grip strength (dominant), green theraputty provided and educated on HEP with good return demonstration. Will sign off. Upon hospital discharge, recommend no OT follow up.   Recommendations for follow up therapy are one component of a multi-disciplinary discharge planning process, led by the attending physician.  Recommendations may be updated based on patient status, additional functional criteria and insurance authorization.   Follow Up Recommendations  No OT follow up    Assistance Recommended at Discharge Set up Supervision/Assistance  Patient can return home with the following Assistance with cooking/housework    Functional Status Assessment  Patient has had a recent decline in their functional status and demonstrates the ability to make significant improvements in function in a reasonable and predictable amount of time.  Equipment Recommendations  None recommended by OT     Recommendations for Other Services       Precautions / Restrictions Precautions Precautions: Fall Restrictions Weight Bearing Restrictions: No      Mobility Bed Mobility Overal bed mobility: Independent                  Transfers Overall transfer level: Needs assistance Equipment used: None Transfers: Sit to/from Stand Sit to Stand: Supervision                  Balance Overall balance assessment: Needs assistance Sitting-balance support: No upper extremity supported, Feet supported Sitting balance-Leahy Scale: Normal     Standing balance support: No upper extremity supported, During functional activity Standing balance-Leahy Scale: Good Standing balance comment: BUE support for weight shifting activities (toes up / heels up)                           ADL either performed or assessed with clinical judgement   ADL Overall ADL's : Needs assistance/impaired                                       General ADL Comments: SUPERVISION no AD use for toilet t/f and functional reach outside BOS - intermittent single UE support on furntiure with good safety awareness.      Pertinent Vitals/Pain Pain Assessment Pain Assessment: No/denies pain     Hand Dominance     Extremity/Trunk Assessment Upper Extremity Assessment Upper Extremity Assessment: RUE deficits/detail RUE Deficits / Details: 4-/5 grip, digit opposition intact   Lower Extremity Assessment Lower Extremity Assessment: Defer to PT evaluation       Communication Communication  Communication: No difficulties;HOH   Cognition Arousal/Alertness: Awake/alert Behavior During Therapy: WFL for tasks assessed/performed Overall Cognitive Status: Within Functional Limits for tasks assessed                                       General Comments       Exercises Exercises: Other exercises Other Exercises Other Exercises: Pt provided green theraputty and  reviewed HEP with good return demonstration   Shoulder Instructions      Home Living Family/patient expects to be discharged to:: Private residence Living Arrangements: Spouse/significant other Available Help at Discharge: Family   Home Access: Stairs to enter Entrance Stairs-Number of Steps: 6 Entrance Stairs-Rails: Can reach both                 Home Equipment: Conservation officer, nature (2 wheels);Cane - single point          Prior Functioning/Environment Prior Level of Function : Independent/Modified Independent             Mobility Comments: Pt manages a Environmental consultant, reports she can sit/rest when needed but is on her feet most of the day ADLs Comments: able to do all self and home management ADLs        OT Problem List: Decreased strength         OT Goals(Current goals can be found in the care plan section) Acute Rehab OT Goals Patient Stated Goal: to go home OT Goal Formulation: With patient Time For Goal Achievement: 06/09/22 Potential to Achieve Goals: Good   AM-PAC OT "6 Clicks" Daily Activity     Outcome Measure Help from another person eating meals?: None Help from another person taking care of personal grooming?: None Help from another person toileting, which includes using toliet, bedpan, or urinal?: None Help from another person bathing (including washing, rinsing, drying)?: A Little Help from another person to put on and taking off regular upper body clothing?: None Help from another person to put on and taking off regular lower body clothing?: None 6 Click Score: 23   End of Session    Activity Tolerance: Patient tolerated treatment well Patient left: in bed;with call bell/phone within reach  OT Visit Diagnosis: Muscle weakness (generalized) (M62.81)                Time: 7858-8502 OT Time Calculation (min): 12 min Charges:  OT General Charges $OT Visit: 1 Visit OT Evaluation $OT Eval Low Complexity: 1 Low  Dessie Coma, M.S. OTR/L   05/26/22, 11:12 AM  ascom [phone]

## 2022-05-26 NOTE — Evaluation (Signed)
Physical Therapy Evaluation Patient Details Name: Carol [name] MRN: [id number] DOB: 10-24-1955 Today's Date: 05/26/2022  History of Present Illness  66 year old female with a past medical history significant for hypertension, hyperlipidemia, prior stroke with right-sided weakness (minimal residual deficits) migraines, and bipolar disorder who presented to [facility] ED on 05/24/2022 due to acute onset of near syncope with slurred speech and right arm and right leg weakness.  Imaging did not reveal actute insult, recieved TNK, symptoms had nearly resolved/returned to baseline at time of eval.  Clinical Impression  Pt eager to get up and work with PT and ultimately did quite well.  She has minimal R sided weakness (U&LEs) from prior CVA and though imaging does not reveal acute insult and most of symptoms have resolved she subjectively reports some minimal change from baseline with R side.  She was, however, able to don socks, lace and tie her ankle brace, use a phone etc with R UE with minimal increased effort.  Similarly she reports having to be a little more deliberate with R LE swing phase/weight acceptance but was able to maintain consistent cadence with and w/o AD and no LOBs or other overt safey issues.      Recommendations for follow up therapy are one component of a multi-disciplinary discharge planning process, led by the attending physician.  Recommendations may be updated based on patient status, additional functional criteria and insurance authorization.  Follow Up Recommendations Home health PT      Assistance Recommended at Discharge Intermittent Supervision/Assistance  Patient can return home with the following  Assist for transportation;Assistance with cooking/housework    Equipment Recommendations None recommended by PT  Recommendations for Other Services       Functional Status Assessment Patient has had a recent decline in their functional status and demonstrates the ability to  make significant improvements in function in a reasonable and predictable amount of time.     Precautions / Restrictions Precautions Precautions: Fall Restrictions Weight Bearing Restrictions: No      Mobility  Bed Mobility Overal bed mobility: Independent                  Transfers Overall transfer level: Independent Equipment used: Rolling walker (2 wheels), None                    Ambulation/Gait Ambulation/Gait assistance: Supervision Gait Distance (Feet): 200 Feet Assistive device: Rolling walker (2 wheels), None         General Gait Details: Pt was able to ambulate well with and w/o AD.  She does endorse feeling like she needed to deliberately lift R hip/clear foot more than normal but has no LOBs or unsteadiness/safety issues.  Pt's vitals remained appropriate/stable and ultimately she did very well with prolonged ambulation effort  Stairs Stairs: Yes Stairs assistance: Supervision Stair Management: Two rails, Alternating pattern Number of Stairs: 10 General stair comments: Pt was able to safely and confidently negotiate up/down steps with both reciprocal and step-to strategy.  She showed appropraite UE/rail use and had no safety issues.  Wheelchair Mobility    Modified Rankin (Stroke Patients Only)       Balance Overall balance assessment: Modified Independent                                           Pertinent Vitals/Pain Pain Assessment Pain Assessment: No/denies pain  Home Living Family/patient expects to be discharged to:: Private residence Living Arrangements: Spouse/significant other Available Help at Discharge: Family   Home Access: Stairs to enter Entrance Stairs-Rails: Can reach both Entrance Stairs-Number of Steps: [place]: Conservation officer, nature (2 wheels);Cane - single point      Prior Function Prior Level of Function : Independent/Modified Independent             Mobility Comments: Pt  manages a Environmental consultant, reports she can sit/rest when needed but is on her feet most of the day ADLs Comments: able to do all self and home management ADLs     Hand Dominance        Extremity/Trunk Assessment   Upper Extremity Assessment Upper Extremity Assessment:  (R UE grossly 4-/5, reports that R has been slightly weaker since prior CVA, "maybe a little off" but near baseline)    Lower Extremity Assessment Lower Extremity Assessment:  (R LE grossly 4-/5, reports that R has been slightly weaker since prior CVA, "maybe a little off" but near baseline - does lack active ankle DF past neutral)       Communication   Communication: No difficulties;HOH  Cognition Arousal/Alertness: Awake/alert Behavior During Therapy: WFL for tasks assessed/performed Overall Cognitive Status: Within Functional Limits for tasks assessed                                          General Comments      Exercises     Assessment/Plan    PT Assessment Patient needs continued PT services  PT Problem List Decreased strength;Decreased coordination;Decreased balance       PT Treatment Interventions Gait training;Therapeutic exercise;DME instruction;Stair training;Functional mobility training;Therapeutic activities;Balance training;Neuromuscular re-education;Patient/family education    PT Goals (Current goals can be found in the Care Plan section)       Frequency Min 2X/week     Co-evaluation               AM-PAC PT "6 Clicks" Mobility  Outcome Measure Help needed turning from your back to your side while in a flat bed without using bedrails?: None Help needed moving from lying on your back to sitting on the side of a flat bed without using bedrails?: None Help needed moving to and from a bed to a chair (including a wheelchair)?: None Help needed standing up from a chair using your arms (e.g., wheelchair or bedside chair)?: None Help needed to walk in hospital  room?: None Help needed climbing 3-5 steps with a railing? : None 6 Click Score: 24    End of Session Equipment Utilized During Treatment: Gait belt Activity Tolerance: Patient tolerated treatment well Patient left: in chair;with nursing/sitter in room Nurse Communication: Mobility status PT Visit Diagnosis: Muscle weakness (generalized) (M62.81);Other symptoms and signs involving the nervous system (R29.898)    Time: 3382-5053 PT Time Calculation (min) (ACUTE ONLY): 32 min   Charges:   PT Evaluation $PT Eval Low Complexity: 1 Low PT Treatments $Gait Training: 8-22 mins        Kreg Shropshire, DPT 05/26/2022, 10:08 AM

## 2022-05-26 NOTE — Social Work (Signed)
CSW notified of impending discharge and orders for [facility]. Pt agreeable to [facility] PT, has no preference for [facility] agency. CSW contacted [facility], as they take her insurance. They have accepted pt and will follow up to set up services.

## 2022-05-28 ENCOUNTER — Telehealth: Payer: Self-pay

## 2022-05-28 ENCOUNTER — Encounter: Payer: Self-pay | Admitting: Nurse Practitioner

## 2022-05-28 ENCOUNTER — Ambulatory Visit (INDEPENDENT_AMBULATORY_CARE_PROVIDER_SITE_OTHER): Payer: Medicare Other | Admitting: Nurse Practitioner

## 2022-05-28 VITALS — BP 136/56 | HR 60 | Temp 97.9°F | Resp 16 | Ht 60.0 in | Wt 140.2 lb

## 2022-05-28 DIAGNOSIS — Z760 Encounter for issue of repeat prescription: Secondary | ICD-10-CM | POA: Diagnosis not present

## 2022-05-28 DIAGNOSIS — Z8673 Personal history of transient ischemic attack (TIA), and cerebral infarction without residual deficits: Secondary | ICD-10-CM | POA: Diagnosis not present

## 2022-05-28 DIAGNOSIS — E782 Mixed hyperlipidemia: Secondary | ICD-10-CM

## 2022-05-28 DIAGNOSIS — J301 Allergic rhinitis due to pollen: Secondary | ICD-10-CM | POA: Diagnosis not present

## 2022-05-28 DIAGNOSIS — I10 Essential (primary) hypertension: Secondary | ICD-10-CM

## 2022-05-28 DIAGNOSIS — E039 Hypothyroidism, unspecified: Secondary | ICD-10-CM

## 2022-05-28 DIAGNOSIS — Z09 Encounter for follow-up examination after completed treatment for conditions other than malignant neoplasm: Secondary | ICD-10-CM | POA: Diagnosis not present

## 2022-05-28 DIAGNOSIS — I639 Cerebral infarction, unspecified: Secondary | ICD-10-CM | POA: Diagnosis not present

## 2022-05-28 MED ORDER — ATORVASTATIN CALCIUM 40 MG PO TABS: 40.0000 mg | ORAL_TABLET | Freq: Every day | ORAL | 3 refills | Status: AC

## 2022-05-28 MED ORDER — LISINOPRIL 10 MG PO TABS: 10.0000 mg | ORAL_TABLET | Freq: Every day | ORAL | 3 refills | Status: DC

## 2022-05-28 MED ORDER — CETIRIZINE HCL 10 MG PO TABS: 10.0000 mg | ORAL_TABLET | Freq: Every day | ORAL | 1 refills | Status: AC

## 2022-05-28 MED ORDER — CITALOPRAM HYDROBROMIDE 40 MG PO TABS: 40.0000 mg | ORAL_TABLET | Freq: Every day | ORAL | 3 refills | Status: AC

## 2022-05-28 MED ORDER — LEVOTHYROXINE SODIUM 100 MCG PO TABS: 100.0000 ug | ORAL_TABLET | Freq: Every day | ORAL | 3 refills | Status: AC

## 2022-05-28 NOTE — Telephone Encounter (Signed)
Lmom to cindy from [facility] for verbal order we call back again Monday [phone]

## 2022-05-28 NOTE — Progress Notes (Signed)
[facility] [address], [address] [place] [id number] [facility] Discharge Acute Issues Care Follow Up                                                                        Patient Demographics  Carol [name], is a 66 y.o. female  DOB 1955-10-11  MRN [id number].  Primary MD  Jonetta Osgood, NP   Reason for TCC follow Up - CVA   Past Medical History:  Diagnosis Date   Bipolar affective disorder ([place])    Coronary artery disease    Hyperlipidemia    Hypertension    Hypothyroid    Myocardial infarction [facility])    Stroke ([place]) 11/15/2016   "right side weaker since" (12/20/2016)    Past Surgical History:  Procedure Laterality Date   ABDOMINAL HYSTERECTOMY     APPENDECTOMY     CARDIAC CATHETERIZATION Left 09/26/2015   Procedure: Left Heart Cath and Coronary Angiography;  Surgeon: Teodoro Spray, MD;  Location: [place] [facility] LAB;  Service: Cardiovascular;  Laterality: Left;   CHOLECYSTECTOMY N/A 12/22/2016   Procedure: LAPAROSCOPIC CHOLECYSTECTOMY WITH INTRAOPERATIVE CHOLANGIOGRAM;  Surgeon: Jovita Kussmaul, MD;  Location: [place];  Service: General;  Laterality: N/A;   CORONARY STENT INTERVENTION N/A 04/13/2018   Procedure: CORONARY STENT INTERVENTION;  Surgeon: Yolonda Kida, MD;  Location: [place] [facility];  Service: Cardiovascular;  Laterality: N/A;   ERCP N/A 12/20/2016   Procedure: ENDOSCOPIC RETROGRADE CHOLANGIOPANCREATOGRAPHY (ERCP);  Surgeon: Teena Irani, MD;  Location: [facility] ENDOSCOPY;  Service: Endoscopy;  Laterality: N/A;   HYSTEROTOMY     LEFT HEART CATH AND CORONARY ANGIOGRAPHY Left 04/13/2018   Procedure: LEFT HEART CATH AND CORONARY ANGIOGRAPHY;  Surgeon: Teodoro Spray, MD;  Location: [place] [facility];  Service: Cardiovascular;  Laterality: Left;   LEFT HEART CATH AND CORONARY ANGIOGRAPHY Left 12/25/2019   Procedure: LEFT HEART CATH AND CORONARY  ANGIOGRAPHY;  Surgeon: Teodoro Spray, MD;  Location: [place] [facility] LAB;  Service: Cardiovascular;  Laterality: Left;       Recent HPI and Hospital Course  History of present illness:  From admission h and p Carol [name] is a 66 year old female with a past medical history significant for hypertension, hyperlipidemia, prior stroke with right-sided weakness (no residual deficits) migraines, and bipolar disorder who presented to [facility] ED on 05/24/2022 due to acute onset of near syncope with slurred speech and right arm and right leg weakness.   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  Last known well time was 3:45 PM she reported she had had a stroke in the past with similar symptoms which could completely resolved.  She has had a headache for couple of days which is unlike her old migraines on the right side, but had not had any focal deficits.  She denied chest pain, shortness of breath, cough, abdominal pain, nausea, vomiting, diarrhea, dysuria.  She is not on outpatient anticoagulants.  Hospital Course:  Hx CVA, presenting with right-sided weakness and slurred speech. Unable to perform MRI 2/2 device. Neuroimaging unremarkable but treated for presumed ischemic stroke with TNK. Neuro deficits resolved. Evaluated by pt/ot, will order home health PT. TTE unremarkable. Neuro OK with discharge, will increase statin given ldl of 105. Referred to outpatient neurology, will also need pcp f/u.  [facility] Acute Care Issue to be followed in the Clinic   Principal Problem:   CVA (cerebral vascular accident) [facility]) Active Problems:   Bipolar disorder ([place])   Coronary artery disease   Essential hypertension   Hypothyroidism   History of stroke   Tobacco use   Subjective:   Carol [name] today has, No headache, No chest pain, No abdominal pain - No Nausea, No new weakness tingling or numbness, No Cough - SOB. Right sided weakness, going to physical  therapy.   Assessment & Plan   1. Hospital discharge follow-up Recent stroke, with some deficits. BP stable, was treated and stabilized in hospital.   2. History of recent stroke Stroke with right sided weakness, going to PT, no other residual deficits  3. Medication refill - cetirizine (ZYRTEC) 10 MG tablet; Take 1 tablet (10 mg total) by mouth daily.  Dispense: 90 tablet; Refill: 1 - citalopram (CELEXA) 40 MG tablet; Take 1 tablet (40 mg total) by mouth daily.  Dispense: 90 tablet; Refill: 3 - levothyroxine (SYNTHROID) 100 MCG tablet; Take 1 tablet (100 mcg total) by mouth daily.  Dispense: 90 tablet; Refill: 3 - lisinopril (ZESTRIL) 10 MG tablet; Take 1 tablet (10 mg total) by mouth daily.  Dispense: 90 tablet; Refill: 3 - atorvastatin (LIPITOR) 40 MG tablet; Take 1 tablet (40 mg total) by mouth daily. Take 1 tab po daily  Dispense: 90 tablet; Refill: 3    Reason for frequent admissions/ER visits       Objective:   Vitals:   05/28/22 1054  BP: (!) 136/56  Pulse: 60  Resp: 16  Temp: 97.9 F (36.6 C)  SpO2: 99%  Weight: 140 lb 3.2 oz (63.6 kg)  Height: 5' (1.524 m)    Wt Readings from Last 3 Encounters:  05/28/22 140 lb 3.2 oz (63.6 kg)  05/24/22 138 lb 7.2 oz (62.8 kg)  10/19/21 134 lb 12.8 oz (61.1 kg)    Allergies as of 05/28/2022       Reactions   Morphine Other (See Comments)   Hallucinations    Sulfa Antibiotics Hives   Sulfamethoxazole-trimethoprim Swelling   Azithromycin Diarrhea, Nausea And Vomiting   Burn stomach   Pregabalin Other (See Comments)   Pt states that she was told not to take this medication.     Asa [aspirin] Hives, Nausea Only, Other (See Comments)   Burning of stomach    Rofecoxib Other (See Comments)   Burning of stomach         Medication List        Accurate as of May 28, 2022 11:40 AM. If you have any questions, ask your nurse or doctor.          STOP taking these medications    SYRINGE/NEEDLE (DISP) 1 ML 23G  X 1" 1 ML Misc Stopped by: Jonetta Osgood, NP       TAKE these medications    acetaminophen 500 MG tablet Commonly known as: TYLENOL Take 1,000 mg by mouth every 6 (six) hours as needed for mild pain or headache.   albuterol (2.5 MG/3ML) 0.083% nebulizer solution Commonly known as:  PROVENTIL USE 1 VIAL VIA NEBULIZER  EVERY 4 TO 6 HOURS AS  NEEDED What changed: See the new instructions.   atorvastatin 40 MG tablet Commonly known as: LIPITOR Take 1 tablet (40 mg total) by mouth daily. Take 1 tab po daily   Calcium Carbonate-Vitamin D 600-200 MG-UNIT Caps Take 1 tablet by mouth daily.   cetirizine 10 MG tablet Commonly known as: ZYRTEC Take 1 tablet (10 mg total) by mouth daily.   citalopram 40 MG tablet Commonly known as: CELEXA Take 1 tablet (40 mg total) by mouth daily.   clopidogrel 75 MG tablet Commonly known as: PLAVIX Take 75 mg by mouth daily.   diltiazem 120 MG 24 hr capsule Commonly known as: CARDIZEM CD Take 1 capsule (120 mg total) by mouth daily.   diltiazem 30 MG tablet Commonly known as: CARDIZEM Take 30 mg by mouth daily as needed (Heart palpitations).   fluticasone 50 MCG/ACT nasal spray Commonly known as: FLONASE Place 1 spray into both nostrils daily.   isosorbide mononitrate 30 MG 24 hr tablet Commonly known as: IMDUR Take 1 tablet (30 mg total) by mouth daily.   levothyroxine 100 MCG tablet Commonly known as: SYNTHROID Take 1 tablet (100 mcg total) by mouth daily.   lisinopril 10 MG tablet Commonly known as: ZESTRIL Take 1 tablet (10 mg total) by mouth daily.   metoprolol tartrate 25 MG tablet Commonly known as: LOPRESSOR Take 12.5 mg by mouth daily.   nitroGLYCERIN 0.4 MG SL tablet Commonly known as: NITROSTAT Place 1 tablet (0.4 mg total) under the tongue every 5 (five) minutes as needed for chest pain.   pantoprazole 40 MG tablet Commonly known as: PROTONIX Take 1 tablet (40 mg total) by mouth 2 (two) times daily.          Physical Exam: Constitutional: Patient appears well-developed and well-nourished. Not in obvious distress. HENT: Normocephalic, atraumatic, External right and left ear normal. Oropharynx is clear and moist.  Eyes: Conjunctivae and EOM are normal. PERRLA, no scleral icterus. Neck: Normal ROM. Neck supple. No JVD. No tracheal deviation. No thyromegaly. CVS: RRR, S1/S2 +, no murmurs, no gallops, no carotid bruit.  Pulmonary: Effort and breath sounds normal, no stridor, rhonchi, wheezes, rales.  Abdominal: Soft. BS +, no distension, tenderness, rebound or guarding.  Musculoskeletal: Normal range of motion. No edema and no tenderness.  Lymphadenopathy: No lymphadenopathy noted, cervical, inguinal or axillary Neuro: Alert. Normal reflexes, muscle tone coordination. Right sided weakness Skin: Skin is warm and dry. No rash noted. Not diaphoretic. No erythema. No pallor. Psychiatric: Normal mood and affect. Behavior, judgment, thought content normal.   Data Review   Micro Results Recent Results (from the past 240 hour(s))  MRSA Next Gen by PCR, Nasal     Status: None   Collection Time: 05/24/22  9:30 PM   Specimen: Nasal Mucosa; Nasal Swab  Result Value Ref Range Status   MRSA by PCR Next Gen NOT DETECTED NOT DETECTED Final    Comment: (NOTE) The GeneXpert MRSA Assay (FDA approved for NASAL specimens only), is one component of a comprehensive MRSA colonization surveillance program. It is not intended to diagnose MRSA infection nor to guide or monitor treatment for MRSA infections. Test performance is not FDA approved in patients less than 26 years old. Performed at [facility], [address] [place], [place] [address]      CBC Recent Labs  Lab 05/24/22 1752 05/25/22 0359 05/26/22 0406  WBC 6.3 5.8 5.7  HGB 10.4*  10.4* 10.2*  HCT 32.8* 32.8* 32.2*  PLT 211 184 171  MCV 93.4 92.7 92.3  MCH 29.6 29.4 29.2  MCHC 31.7 31.7 31.7  RDW 13.6 13.3 13.3  LYMPHSABS  2.0  --   --   MONOABS 0.4  --   --   EOSABS 0.1  --   --   BASOSABS 0.1  --   --     Chemistries  Recent Labs  Lab 05/24/22 1752 05/25/22 0359 05/26/22 0406  NA 136 141 141  K 4.8 3.6 4.0  CL 107 111 116*  CO2 '25 25 22  '$ GLUCOSE 94 96 90  BUN '13 12 10  '$ CREATININE 0.91 0.72 0.62  CALCIUM 9.2 9.0 8.6*  MG  --  2.1 2.1  AST 28  --   --   ALT 12  --   --   ALKPHOS 71  --   --   BILITOT 1.1  --   --    ------------------------------------------------------------------------------------------------------------------ estimated creatinine clearance is 56 mL/min (by C-G formula based on SCr of 0.62 mg/dL). ------------------------------------------------------------------------------------------------------------------ No results for input(s): "HGBA1C" in the last 72 hours. ------------------------------------------------------------------------------------------------------------------ No results for input(s): "CHOL", "HDL", "LDLCALC", "TRIG", "CHOLHDL", "LDLDIRECT" in the last 72 hours. ------------------------------------------------------------------------------------------------------------------ No results for input(s): "TSH", "T4TOTAL", "T3FREE", "THYROIDAB" in the last 72 hours.  Invalid input(s): "FREET3" ------------------------------------------------------------------------------------------------------------------ No results for input(s): "VITAMINB12", "FOLATE", "FERRITIN", "TIBC", "IRON", "RETICCTPCT" in the last 72 hours.  Coagulation profile Recent Labs  Lab 05/24/22 1842 05/25/22 0359  INR 1.0 1.1    No results for input(s): "DDIMER" in the last 72 hours.  Cardiac Enzymes No results for input(s): "CKMB", "TROPONINI", "MYOGLOBIN" in the last 168 hours.  Invalid input(s): "CK" ------------------------------------------------------------------------------------------------------------------ Invalid input(s): "POCBNP"  Return for overdue for annual wellness  visit/physical exam..   Time Spent in minutes  45 Time spent with patient included reviewing progress notes, labs, imaging studies, and discussing plan for follow up.   [place] Controlled Substance Database was reviewed by me for overdose risk score (ORS)  This patient was seen by Jonetta Osgood, FNP-C in collaboration with Dr. Clayborn Bigness as a part of collaborative care agreement.    Jonetta Osgood MSN, FNP-C on 05/28/2022 at 11:40 AM   **Disclaimer: This note may have been dictated with voice recognition software. Similar sounding words can inadvertently be transcribed and this note may contain transcription errors which may not have been corrected upon publication of note.**

## 2022-05-31 ENCOUNTER — Telehealth: Payer: Self-pay

## 2022-05-31 NOTE — Telephone Encounter (Signed)
Gave verbal to [facility] cindy [phone] once a week for 1 2 week 3 and 1 week 5

## 2022-06-02 DIAGNOSIS — R2689 Other abnormalities of gait and mobility: Secondary | ICD-10-CM | POA: Diagnosis not present

## 2022-06-02 DIAGNOSIS — I639 Cerebral infarction, unspecified: Secondary | ICD-10-CM | POA: Diagnosis not present

## 2022-06-02 DIAGNOSIS — R531 Weakness: Secondary | ICD-10-CM | POA: Diagnosis not present

## 2022-06-04 ENCOUNTER — Other Ambulatory Visit: Payer: Self-pay | Admitting: Nurse Practitioner

## 2022-06-04 DIAGNOSIS — K219 Gastro-esophageal reflux disease without esophagitis: Secondary | ICD-10-CM

## 2022-06-04 DIAGNOSIS — I639 Cerebral infarction, unspecified: Secondary | ICD-10-CM | POA: Diagnosis not present

## 2022-06-04 DIAGNOSIS — I10 Essential (primary) hypertension: Secondary | ICD-10-CM | POA: Diagnosis not present

## 2022-06-18 DIAGNOSIS — I639 Cerebral infarction, unspecified: Secondary | ICD-10-CM | POA: Diagnosis not present

## 2022-06-18 DIAGNOSIS — I10 Essential (primary) hypertension: Secondary | ICD-10-CM | POA: Diagnosis not present

## 2022-06-22 DIAGNOSIS — I639 Cerebral infarction, unspecified: Secondary | ICD-10-CM | POA: Diagnosis not present

## 2022-06-22 DIAGNOSIS — I10 Essential (primary) hypertension: Secondary | ICD-10-CM | POA: Diagnosis not present

## 2022-06-24 ENCOUNTER — Ambulatory Visit: Payer: Medicare Other | Admitting: Nurse Practitioner

## 2022-06-29 ENCOUNTER — Telehealth: Payer: Self-pay | Admitting: Nurse Practitioner

## 2022-06-29 NOTE — Telephone Encounter (Signed)
05/28/22 order faxed back to  [facility]; [phone]

## 2022-06-29 NOTE — Telephone Encounter (Signed)
Received 05/28/22 order from [facility]. Gave to Alyssa for signature-Toni

## 2022-07-21 DIAGNOSIS — Z955 Presence of coronary angioplasty implant and graft: Secondary | ICD-10-CM | POA: Diagnosis not present

## 2022-07-21 DIAGNOSIS — I2089 Other forms of angina pectoris: Secondary | ICD-10-CM | POA: Diagnosis not present

## 2022-07-21 DIAGNOSIS — I10 Essential (primary) hypertension: Secondary | ICD-10-CM | POA: Diagnosis not present

## 2022-07-21 DIAGNOSIS — E782 Mixed hyperlipidemia: Secondary | ICD-10-CM | POA: Diagnosis not present

## 2022-07-21 DIAGNOSIS — I471 Supraventricular tachycardia, unspecified: Secondary | ICD-10-CM | POA: Diagnosis not present

## 2022-07-21 DIAGNOSIS — I2510 Atherosclerotic heart disease of native coronary artery without angina pectoris: Secondary | ICD-10-CM | POA: Diagnosis not present

## 2022-07-28 ENCOUNTER — Ambulatory Visit: Payer: Medicare Other | Admitting: Nurse Practitioner

## 2022-08-21 ENCOUNTER — Ambulatory Visit
Admission: RE | Admit: 2022-08-21 | Discharge: 2022-08-21 | Disposition: A | Discharge status: Home / Self Care | Payer: Medicare Other | Source: Ambulatory Visit | Attending: Urgent Care | Admitting: Urgent Care

## 2022-08-21 VITALS — BP 121/69 | HR 69 | Temp 97.7°F | Resp 17

## 2022-08-21 DIAGNOSIS — J069 Acute upper respiratory infection, unspecified: Secondary | ICD-10-CM | POA: Diagnosis not present

## 2022-08-21 DIAGNOSIS — Z79899 Other long term (current) drug therapy: Secondary | ICD-10-CM | POA: Insufficient documentation

## 2022-08-21 DIAGNOSIS — Z8673 Personal history of transient ischemic attack (TIA), and cerebral infarction without residual deficits: Secondary | ICD-10-CM | POA: Diagnosis not present

## 2022-08-21 DIAGNOSIS — Z7902 Long term (current) use of antithrombotics/antiplatelets: Secondary | ICD-10-CM | POA: Insufficient documentation

## 2022-08-21 DIAGNOSIS — Z7951 Long term (current) use of inhaled steroids: Secondary | ICD-10-CM | POA: Diagnosis not present

## 2022-08-21 DIAGNOSIS — Z1152 Encounter for screening for COVID-19: Secondary | ICD-10-CM | POA: Insufficient documentation

## 2022-08-21 DIAGNOSIS — I25110 Atherosclerotic heart disease of native coronary artery with unstable angina pectoris: Secondary | ICD-10-CM | POA: Insufficient documentation

## 2022-08-21 DIAGNOSIS — J029 Acute pharyngitis, unspecified: Secondary | ICD-10-CM

## 2022-08-21 LAB — POCT RAPID STREP A (OFFICE): Rapid Strep A Screen: NEGATIVE

## 2022-08-21 MED ORDER — BENZONATATE 100 MG PO CAPS: ORAL_CAPSULE | ORAL | 0 refills | Status: DC

## 2022-08-21 NOTE — ED Triage Notes (Signed)
Pt. Presents to UC w/ c/o a cough, sore throat, and chest congestion for the past 5 days.

## 2022-08-21 NOTE — ED Provider Notes (Signed)
[facility]    CSN: [id number] Arrival date & time: 08/21/22  1351      History   Chief Complaint Chief Complaint  Patient presents with   Cough   Sore Throat    HPI Carol [name] is a 67 y.o. female.    Cough Sore Throat    Patient presents to urgent care with complaint of cough, sore throat, chest congestion.  She states her symptoms started about 5 days "with a cold" which she clarifies as stuffy nose and a little cough that is adequately treated with OTC medications such as Robitussin.  She states her symptoms acutely worsened yesterday with development of a fever (undocumented) worsening cough, sore throat.  Denies chills and body aches.  Denies nausea vomiting diarrhea.  Past medical history is significant for stroke and CAD per patient.  Past Medical History:  Diagnosis Date   Bipolar affective disorder ([employer])    Coronary artery disease    Hyperlipidemia    Hypertension    Hypothyroid    Myocardial infarction [facility])    Stroke ([place]) 11/15/2016   "right side weaker since" (12/20/2016)    Patient Active Problem List   Diagnosis Date Noted   CVA (cerebral vascular accident) ([place]) 05/24/2022   Acute non-recurrent frontal sinusitis 01/24/2020   Seasonal allergic rhinitis due to pollen 01/24/2020   Acute upper respiratory infection 12/08/2019   Intractable vomiting 12/08/2019   S/P drug eluting coronary stent placement 04/13/2018   SVT (supraventricular tachycardia) 04/10/2018   Hyperlipidemia, unspecified 12/01/2017   Choledocholithiasis with acute cholecystitis 12/19/2016   Coronary artery disease 12/19/2016   Essential hypertension 12/19/2016   Hypothyroidism 12/19/2016   History of stroke 12/19/2016   Migraine 06/17/2016   Cognitive deficit, post-stroke 05/18/2015   Chest pain 05/12/2015   Right facial numbness 03/30/2015   Lithium intoxication 02/27/2015   Bipolar disorder ([place]) 02/27/2015   CVA (cerebral infarction) 02/26/2015   Unstable  angina ([place]) 02/03/2015   Tobacco use 01/19/2013   Temporomandibular joint disorder 01/02/2013   Tarsal tunnel syndrome 07/21/2012   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   Plantar fascial fibromatosis 07/21/2012   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    Past Surgical History:  Procedure Laterality Date   ABDOMINAL HYSTERECTOMY     APPENDECTOMY     CARDIAC CATHETERIZATION Left 09/26/2015   Procedure: Left Heart Cath and Coronary Angiography;  Surgeon: Teodoro Spray, MD;  Location: [place] [facility];  Service: Cardiovascular;  Laterality: Left;   CHOLECYSTECTOMY N/A 12/22/2016   Procedure: LAPAROSCOPIC CHOLECYSTECTOMY WITH INTRAOPERATIVE CHOLANGIOGRAM;  Surgeon: Jovita Kussmaul, MD;  Location: [place];  Service: General;  Laterality: N/A;   CORONARY STENT INTERVENTION N/A 04/13/2018   Procedure: CORONARY STENT INTERVENTION;  Surgeon: Yolonda Kida, MD;  Location: [place] CV LAB;  Service: Cardiovascular;  Laterality: N/A;   ERCP N/A 12/20/2016   Procedure: ENDOSCOPIC RETROGRADE CHOLANGIOPANCREATOGRAPHY (ERCP);  Surgeon: Teena Irani, MD;  Location: [facility] ENDOSCOPY;  Service: Endoscopy;  Laterality: N/A;   HYSTEROTOMY     LEFT HEART CATH AND CORONARY ANGIOGRAPHY Left 04/13/2018   Procedure: LEFT HEART CATH AND CORONARY ANGIOGRAPHY;  Surgeon: Teodoro Spray, MD;  Location: [place] CV LAB;  Service: Cardiovascular;  Laterality: Left;   LEFT HEART CATH AND CORONARY ANGIOGRAPHY Left 12/25/2019   Procedure: LEFT HEART CATH AND CORONARY ANGIOGRAPHY;  Surgeon: Teodoro Spray, MD;  Location: [place] [facility] LAB;  Service: Cardiovascular;  Laterality: Left;  OB History   No obstetric history on file.      Home Medications    Prior to Admission medications   Medication Sig Start Date End Date Taking? Authorizing Provider  acetaminophen (TYLENOL) 500 MG tablet Take 1,000 mg by mouth every 6 (six) hours as needed for mild pain or headache.     [provider]  albuterol (PROVENTIL) (2.5 MG/3ML) 0.083% nebulizer solution USE 1 VIAL VIA NEBULIZER  EVERY 4 TO 6 HOURS AS  NEEDED Patient taking differently: Take 2.5 mg by nebulization every 4 (four) hours as needed for wheezing or shortness of breath. 09/07/17   Lavera Guise, MD  atorvastatin (LIPITOR) 40 MG tablet Take 1 tablet (40 mg total) by mouth daily. Take 1 tab po daily 05/28/22   Jonetta Osgood, NP  Calcium Carbonate-Vitamin D 600-200 MG-UNIT CAPS Take 1 tablet by mouth daily.     [provider]  cetirizine (ZYRTEC) 10 MG tablet Take 1 tablet (10 mg total) by mouth daily. 05/28/22   Jonetta Osgood, NP  citalopram (CELEXA) 40 MG tablet Take 1 tablet (40 mg total) by mouth daily. 05/28/22   Jonetta Osgood, NP  clopidogrel (PLAVIX) 75 MG tablet Take 75 mg by mouth daily.     [provider]  diltiazem (CARDIZEM CD) 120 MG 24 hr capsule Take 1 capsule (120 mg total) by mouth daily. 10/19/21   Jonetta Osgood, NP  diltiazem (CARDIZEM) 30 MG tablet Take 30 mg by mouth daily as needed (Heart palpitations).  10/25/19   [provider]  fluticasone (FLONASE) 50 MCG/ACT nasal spray Place 1 spray into both nostrils daily. 10/19/21   Jonetta Osgood, NP  isosorbide mononitrate (IMDUR) 30 MG 24 hr tablet Take 1 tablet (30 mg total) by mouth daily. 10/19/21   Jonetta Osgood, NP  levothyroxine (SYNTHROID) 100 MCG tablet Take 1 tablet (100 mcg total) by mouth daily. 05/28/22   Jonetta Osgood, NP  lisinopril (ZESTRIL) 10 MG tablet Take 1 tablet (10 mg total) by mouth daily. 05/28/22   Jonetta Osgood, NP  metoprolol tartrate (LOPRESSOR) 25 MG tablet Take 12.5 mg by mouth daily.     [provider]  nitroGLYCERIN (NITROSTAT) 0.4 MG SL tablet Place 1 tablet (0.4 mg total) under the tongue every 5 (five) minutes as needed for chest pain. 10/19/21   Jonetta Osgood, NP  pantoprazole (PROTONIX) 40 MG tablet TAKE 1 TABLET BY MOUTH TWICE  DAILY  06/07/22   Jonetta Osgood, NP    Family History Family History  Problem Relation Age of Onset   CAD Other     Social History Social History   Tobacco Use   Smoking status: Every Day    Packs/day: 0.50    Years: 45.00    Total pack years: 22.50    Types: Cigarettes   Smokeless tobacco: Never   Tobacco comments:    was up to two packs a day  Vaping Use   Vaping Use: Never used  Substance Use Topics   Alcohol use: Not Currently    Comment: occasionally    Drug use: Yes    Comment: CBD gummies     Allergies   Morphine, Sulfa antibiotics, Sulfamethoxazole-trimethoprim, Azithromycin, Pregabalin, Asa [aspirin], and Rofecoxib   Review of Systems Review of Systems  Respiratory:  Positive for cough.      Physical Exam Triage Vital Signs ED Triage Vitals  Enc Vitals Group     BP 08/21/22 1434 121/69     Pulse Rate 08/21/22 1434  69     Resp 08/21/22 1434 17     Temp 08/21/22 1434 97.7 F (36.5 C)     Temp src --      SpO2 08/21/22 1434 96 %     Weight --      Height --      Head Circumference --      Peak Flow --      Pain Score 08/21/22 1435 0     Pain Loc --      Pain Edu? --      Excl. in [place]? --    No data found.  Updated Vital Signs BP 121/69   Pulse 69   Temp 97.7 F (36.5 C)   Resp 17   SpO2 96%   Visual Acuity Right Eye Distance:   Left Eye Distance:   Bilateral Distance:    Right Eye Near:   Left Eye Near:    Bilateral Near:     Physical Exam Vitals reviewed.  Constitutional:      Appearance: She is well-developed. She is ill-appearing.  HENT:     Mouth/Throat:     Pharynx: No oropharyngeal exudate or posterior oropharyngeal erythema.  Cardiovascular:     Rate and Rhythm: Normal rate and regular rhythm.     Heart sounds: Normal heart sounds.  Pulmonary:     Effort: Pulmonary effort is normal.     Breath sounds: Normal breath sounds. No wheezing or rhonchi.  Skin:    General: Skin is warm and dry.  Neurological:      General: No focal deficit present.     Mental Status: She is alert and oriented to person, place, and time.  Psychiatric:        Mood and Affect: Mood normal.        Behavior: Behavior normal.      UC Treatments / Results  Labs (all labs ordered are listed, but only abnormal results are displayed) Labs Reviewed  SARS CORONAVIRUS 2 (TAT 6-24 HRS)    EKG   Radiology No results found.  Procedures Procedures (including critical care time)  Medications Ordered in UC Medications - No data to display  Initial Impression / Assessment and Plan / UC Course  I have reviewed the triage vital signs and the nursing notes.  Pertinent labs & imaging results that were available during my care of the patient were reviewed by me and considered in my medical decision making (see chart for details).   Patient is afebrile here without recent antipyretics. Satting well on room air. Overall is ill appearing, though well hydrated, without respiratory distress. Pulmonary exam is unremarkable.  Lungs CTAB without wheezing, rhonchi, rales.  No pharyngeal erythema.  No peritonsillar exudates.  Her symptoms are consistent with acute viral process including influenza and COVID.  COVID swab was obtained.  She is outside the treatment window for influenza and so will not offer Tamiflu.  Her symptoms are fairly mild without shortness of breath or dyspnea.  She is also likely outside the window for COVID treatment and would not recommend Paxlovid even given her comorbidities.  Will order benzonatate for cough which she states she has tolerated before.  Otherwise recommending continued use of OTC medication for symptom control.  Final Clinical Impressions(s) / UC Diagnoses   Final diagnoses:  Sore throat   Discharge Instructions   None    ED Prescriptions   None    PDMP not reviewed this encounter.   Rose Phi, [place] 08/21/22  1451  

## 2022-08-21 NOTE — Discharge Instructions (Signed)
Follow up here or with your primary care provider if your symptoms are worsening or not improving.     

## 2022-08-22 LAB — SARS CORONAVIRUS 2 (TAT 6-24 HRS): SARS Coronavirus 2: NEGATIVE

## 2022-08-27 DIAGNOSIS — H43811 Vitreous degeneration, right eye: Secondary | ICD-10-CM | POA: Diagnosis not present

## 2022-08-27 DIAGNOSIS — S060X1S Concussion with loss of consciousness of 30 minutes or less, sequela: Secondary | ICD-10-CM | POA: Diagnosis not present

## 2022-08-27 DIAGNOSIS — H2513 Age-related nuclear cataract, bilateral: Secondary | ICD-10-CM | POA: Diagnosis not present

## 2022-08-27 DIAGNOSIS — H40003 Preglaucoma, unspecified, bilateral: Secondary | ICD-10-CM | POA: Diagnosis not present

## 2022-08-29 ENCOUNTER — Other Ambulatory Visit: Payer: Self-pay | Admitting: Nurse Practitioner

## 2022-08-30 NOTE — Telephone Encounter (Signed)
Pt need appt for refills  ?

## 2022-09-13 DIAGNOSIS — H52223 Regular astigmatism, bilateral: Secondary | ICD-10-CM | POA: Diagnosis not present

## 2022-09-13 DIAGNOSIS — H2511 Age-related nuclear cataract, right eye: Secondary | ICD-10-CM | POA: Diagnosis not present

## 2022-09-14 ENCOUNTER — Encounter: Payer: Self-pay | Admitting: Ophthalmology

## 2022-09-20 NOTE — Discharge Instructions (Signed)

## 2022-09-21 ENCOUNTER — Encounter: Payer: Self-pay | Admitting: Ophthalmology

## 2022-09-21 ENCOUNTER — Other Ambulatory Visit: Payer: Self-pay

## 2022-09-21 ENCOUNTER — Ambulatory Visit: Payer: Medicare Other | Admitting: Anesthesiology

## 2022-09-21 ENCOUNTER — Ambulatory Visit
Admission: RE | Admit: 2022-09-21 | Discharge: 2022-09-21 | Disposition: A | Discharge status: Home / Self Care | Payer: Medicare Other | Attending: Ophthalmology | Admitting: Ophthalmology

## 2022-09-21 ENCOUNTER — Encounter
Admission: RE | Disposition: A | Discharge status: Home / Self Care | Payer: Self-pay | Source: Home / Self Care | Attending: Ophthalmology

## 2022-09-21 DIAGNOSIS — I2510 Atherosclerotic heart disease of native coronary artery without angina pectoris: Secondary | ICD-10-CM | POA: Diagnosis not present

## 2022-09-21 DIAGNOSIS — R519 Headache, unspecified: Secondary | ICD-10-CM | POA: Diagnosis not present

## 2022-09-21 DIAGNOSIS — F17210 Nicotine dependence, cigarettes, uncomplicated: Secondary | ICD-10-CM | POA: Insufficient documentation

## 2022-09-21 DIAGNOSIS — F319 Bipolar disorder, unspecified: Secondary | ICD-10-CM | POA: Insufficient documentation

## 2022-09-21 DIAGNOSIS — H2511 Age-related nuclear cataract, right eye: Secondary | ICD-10-CM | POA: Insufficient documentation

## 2022-09-21 DIAGNOSIS — E039 Hypothyroidism, unspecified: Secondary | ICD-10-CM | POA: Diagnosis not present

## 2022-09-21 DIAGNOSIS — I10 Essential (primary) hypertension: Secondary | ICD-10-CM | POA: Diagnosis not present

## 2022-09-21 DIAGNOSIS — T7840XA Allergy, unspecified, initial encounter: Secondary | ICD-10-CM | POA: Diagnosis not present

## 2022-09-21 HISTORY — DX: Interstitial cystitis (chronic) without hematuria: N30.10

## 2022-09-21 HISTORY — DX: Gastro-esophageal reflux disease without esophagitis: K21.9

## 2022-09-21 HISTORY — PX: CATARACT EXTRACTION W/PHACO: SHX586

## 2022-09-21 HISTORY — DX: Presence of dental prosthetic device (complete) (partial): Z97.2

## 2022-09-21 HISTORY — DX: Presence of external hearing-aid: Z97.4

## 2022-09-21 SURGERY — PHACOEMULSIFICATION, CATARACT, WITH IOL INSERTION
Anesthesia: Monitor Anesthesia Care | Laterality: Right

## 2022-09-21 MED ORDER — TETRACAINE HCL 0.5 % OP SOLN
1.0000 [drp] | OPHTHALMIC | Status: DC | PRN
  Administered 2022-09-21 (×3): 1 [drp] via OPHTHALMIC

## 2022-09-21 MED ORDER — SIGHTPATH DOSE#1 BSS IO SOLN
INTRAOCULAR | Status: DC | PRN
  Administered 2022-09-21: 15 mL via INTRAOCULAR

## 2022-09-21 MED ORDER — MOXIFLOXACIN HCL 0.5 % OP SOLN
OPHTHALMIC | Status: DC | PRN
  Administered 2022-09-21: .2 mL via OPHTHALMIC

## 2022-09-21 MED ORDER — CYCLOPENTOLATE HCL 2 % OP SOLN
1.0000 [drp] | OPHTHALMIC | Status: DC | PRN
  Administered 2022-09-21 (×3): 1 [drp] via OPHTHALMIC

## 2022-09-21 MED ORDER — SIGHTPATH DOSE#1 NA HYALUR & NA CHOND-NA HYALUR IO KIT
PACK | INTRAOCULAR | Status: DC | PRN
  Administered 2022-09-21: 1 via OPHTHALMIC

## 2022-09-21 MED ORDER — FENTANYL CITRATE (PF) 100 MCG/2ML IJ SOLN
INTRAMUSCULAR | Status: DC | PRN
  Administered 2022-09-21: 50 ug via INTRAVENOUS

## 2022-09-21 MED ORDER — MIDAZOLAM HCL 2 MG/2ML IJ SOLN
INTRAMUSCULAR | Status: DC | PRN
  Administered 2022-09-21: 1 mg via INTRAVENOUS

## 2022-09-21 MED ORDER — BRIMONIDINE TARTRATE-TIMOLOL 0.2-0.5 % OP SOLN
OPHTHALMIC | Status: DC | PRN
  Administered 2022-09-21: 1 [drp] via OPHTHALMIC

## 2022-09-21 MED ORDER — PHENYLEPHRINE HCL 10 % OP SOLN
1.0000 [drp] | OPHTHALMIC | Status: DC | PRN
  Administered 2022-09-21 (×3): 1 [drp] via OPHTHALMIC

## 2022-09-21 MED ORDER — SODIUM CHLORIDE 0.9% FLUSH
INTRAVENOUS | Status: DC | PRN
  Administered 2022-09-21: 10 mL via INTRAVENOUS

## 2022-09-21 MED ORDER — SIGHTPATH DOSE#1 BSS IO SOLN
INTRAOCULAR | Status: DC | PRN
  Administered 2022-09-21: 75 mL via OPHTHALMIC

## 2022-09-21 MED ORDER — LACTATED RINGERS IV SOLN: INTRAVENOUS | Status: DC

## 2022-09-21 MED ORDER — LIDOCAINE HCL (PF) 2 % IJ SOLN
INTRAOCULAR | Status: DC | PRN
  Administered 2022-09-21: 4 mL via INTRAOCULAR

## 2022-09-21 SURGICAL SUPPLY — 12 items
CATARACT SUITE SIGHTPATH (MISCELLANEOUS) ×1 IMPLANT
DISSECTOR HYDRO NUCLEUS 50X22 (MISCELLANEOUS) ×1 IMPLANT
DRSG TEGADERM 2-3/8X2-3/4 SM (GAUZE/BANDAGES/DRESSINGS) ×1 IMPLANT
FEE CATARACT SUITE SIGHTPATH (MISCELLANEOUS) ×1 IMPLANT
GLOVE SURG SYN 7.5  E (GLOVE) ×1
GLOVE SURG SYN 7.5 E (GLOVE) ×1 IMPLANT
GLOVE SURG SYN 7.5 PF PI (GLOVE) ×1 IMPLANT
GLOVE SURG SYN 8.5  E (GLOVE) ×1
GLOVE SURG SYN 8.5 E (GLOVE) ×1 IMPLANT
GLOVE SURG SYN 8.5 PF PI (GLOVE) ×1 IMPLANT
LENS IOL TECNIS EYHANCE 17.0 (Intraocular Lens) IMPLANT
WATER STERILE IRR 250ML POUR (IV SOLUTION) ×1 IMPLANT

## 2022-09-21 NOTE — Op Note (Signed)
OPERATIVE NOTE  Carol [name] [id number] 09/21/2022   PREOPERATIVE DIAGNOSIS: Nuclear sclerotic cataract right eye. H25.11   POSTOPERATIVE DIAGNOSIS: Nuclear sclerotic cataract right eye. H25.11   PROCEDURE:  Phacoemusification with posterior chamber intraocular lens placement of the right eye  Ultrasound time: Procedure(s): CATARACT EXTRACTION PHACO AND INTRAOCULAR LENS PLACEMENT (IOC) RIGHT 6.83 00:48.0 (Right)  LENS:   Implant Name Type Inv. Item Serial No. Manufacturer Lot No. LRB No. Used Action  LENS IOL TECNIS EYHANCE 17.0 - [id number] Intraocular Lens LENS IOL TECNIS EYHANCE 17.0 [id number] [employer]  Right 1 Implanted      SURGEON:  Courtney Heys. Lazarus Salines, MD   ANESTHESIA:  Topical with tetracaine drops, augmented with 1% preservative-free intracameral lidocaine.   COMPLICATIONS:  None.   DESCRIPTION OF PROCEDURE:  The patient was identified in the holding room and transported to the operating room and placed in the supine position under the operating microscope.  The right eye was identified as the operative eye, which was prepped and draped in the usual sterile ophthalmic fashion.   A 1 millimeter clear-corneal paracentesis was made superotemporally. Preservative-free 1% lidocaine mixed with 1:1,000 bisulfite-free aqueous solution of epinephrine was injected into the anterior chamber. The anterior chamber was then filled with Viscoat viscoelastic. A 2.4 millimeter keratome was used to make a clear-corneal incision inferotemporally. A curvilinear capsulorrhexis was made with a cystotome and capsulorrhexis forceps. Balanced salt solution was used to hydrodissect and hydrodelineate the nucleus. Phacoemulsification was then used to remove the lens nucleus and epinucleus. The remaining cortex was then removed using the irrigation and aspiration handpiece. Provisc was then placed into the capsular bag to distend it for lens placement. A +17.00 D DIB00 intraocular lens was then injected into  the capsular bag. The remaining viscoelastic was aspirated.   Wounds were hydrated with balanced salt solution.  The anterior chamber was inflated to a physiologic pressure with balanced salt solution.  No wound leaks were noted. Vigamox was injected intracamerally.  Timolol and Brimonidine drops were applied to the eye.  The patient was taken to the recovery room in stable condition without complications of anesthesia or surgery.  [employer] 09/21/2022, 11:03 AM

## 2022-09-21 NOTE — Transfer of Care (Signed)
Immediate Anesthesia Transfer of Care Note  Patient: Carol [name]  Procedure(s) Performed: CATARACT EXTRACTION PHACO AND INTRAOCULAR LENS PLACEMENT (IOC) RIGHT 6.83 00:48.0 (Right)  Patient Location: PACU  Anesthesia Type: MAC  Level of Consciousness: awake, alert  and patient cooperative  Airway and Oxygen Therapy: Patient Spontanous Breathing and Patient connected to supplemental oxygen  Post-op Assessment: Post-op Vital signs reviewed, Patient's Cardiovascular Status Stable, Respiratory Function Stable, Patent Airway and No signs of Nausea or vomiting  Post-op Vital Signs: Reviewed and stable  Complications: No notable events documented.

## 2022-09-21 NOTE — Anesthesia Postprocedure Evaluation (Signed)
Anesthesia Post Note  Patient: Carol [name]  Procedure(s) Performed: CATARACT EXTRACTION PHACO AND INTRAOCULAR LENS PLACEMENT (IOC) RIGHT 6.83 00:48.0 (Right)  Patient location during evaluation: PACU Anesthesia Type: MAC Level of consciousness: awake and alert Pain management: pain level controlled Vital Signs Assessment: post-procedure vital signs reviewed and stable Respiratory status: spontaneous breathing, nonlabored ventilation, respiratory function stable and patient connected to nasal cannula oxygen Cardiovascular status: stable and blood pressure returned to baseline Postop Assessment: no apparent nausea or vomiting Anesthetic complications: no  No notable events documented.   Last Vitals:  Vitals:   09/21/22 1104 09/21/22 1109  BP: (!) 120/47 (!) 106/50  Pulse: (!) 49 (!) 50  Resp: 10 13  Temp: (!) 36.3 C (!) 36.3 C  SpO2: 97% 95%    Last Pain:  Vitals:   09/21/22 1109  TempSrc:   PainSc: 0-No pain                 Dimas Millin

## 2022-09-21 NOTE — Anesthesia Preprocedure Evaluation (Signed)
Anesthesia Evaluation  Patient identified by MRN, date of birth, ID band Patient awake    Reviewed: Allergy & Precautions, NPO status , Patient's Chart, lab work & pertinent test results, reviewed documented beta blocker date and time   History of Anesthesia Complications Negative for: history of anesthetic complications  Airway Mallampati: II  TM Distance: >3 FB Neck ROM: Full    Dental  (+) Edentulous Upper, Edentulous Lower   Pulmonary neg shortness of breath, asthma , neg COPD, Current Smoker and Patient abstained from smoking., former smoker   breath sounds clear to auscultation       Cardiovascular hypertension, Pt. on medications and Pt. on home beta blockers (-) angina + CAD and + Past MI   Rhythm:Regular     Neuro/Psych  Headaches PSYCHIATRIC DISORDERS   Bipolar Disorder   CVA    GI/Hepatic Neg liver ROS,,,  Endo/Other  Hypothyroidism    Renal/GU negative Renal ROS  negative genitourinary   Musculoskeletal negative musculoskeletal ROS (+)    Abdominal   Peds negative pediatric ROS (+)  Hematology negative hematology ROS (+)   Anesthesia Other Findings - HLD  Reproductive/Obstetrics negative OB ROS                             Anesthesia Physical Anesthesia Plan  ASA: 3  Anesthesia Plan: MAC   Post-op Pain Management:    Induction: Intravenous  PONV Risk Score and Plan: 1  Airway Management Planned: Nasal Cannula and Natural Airway  Additional Equipment: None  Intra-op Plan:   Post-operative Plan: Extubation in OR  Informed Consent: I have reviewed the patients History and Physical, chart, labs and discussed the procedure including the risks, benefits and alternatives for the proposed anesthesia with the patient or authorized representative who has indicated his/her understanding and acceptance.     Dental advisory given  Plan Discussed with: CRNA and  Surgeon  Anesthesia Plan Comments: (Patient consented for risks of anesthesia including but not limited to:  - adverse reactions to medications - damage to eyes, teeth, lips or other oral mucosa - nerve damage due to positioning  - sore throat or hoarseness - Damage to heart, brain, nerves, lungs, other parts of body or loss of life  Patient voiced understanding.)        Anesthesia Quick Evaluation

## 2022-09-21 NOTE — H&P (Signed)
[facility]   Primary Care Physician:  Jonetta Osgood, NP Ophthalmologist: Dr. Merleen Nicely  Pre-Procedure History & Physical: HPI:  Carol [name] is a 67 y.o. female here for cataract surgery.   Past Medical History:  Diagnosis Date   Bipolar affective disorder ([place])    Coronary artery disease    GERD (gastroesophageal reflux disease)    Hyperlipidemia    Hypertension    Hypothyroid    Interstitial cystitis    Myocardial infarction ([place]) 2018   Stroke ([place]) 11/15/2016   "right side weaker since" (12/20/2016)   Stroke ([place]) 05/2022   No deficits   Wears dentures    full upper and lower   Wears hearing aid in right ear     Past Surgical History:  Procedure Laterality Date   ABDOMINAL HYSTERECTOMY     APPENDECTOMY     CARDIAC CATHETERIZATION Left 09/26/2015   Procedure: Left Heart Cath and Coronary Angiography;  Surgeon: Teodoro Spray, MD;  Location: [place] [facility];  Service: Cardiovascular;  Laterality: Left;   CHOLECYSTECTOMY N/A 12/22/2016   Procedure: LAPAROSCOPIC CHOLECYSTECTOMY WITH INTRAOPERATIVE CHOLANGIOGRAM;  Surgeon: Jovita Kussmaul, MD;  Location: [place];  Service: General;  Laterality: N/A;   CORONARY STENT INTERVENTION N/A 04/13/2018   Procedure: CORONARY STENT INTERVENTION;  Surgeon: Yolonda Kida, MD;  Location: [place] [facility];  Service: Cardiovascular;  Laterality: N/A;   ERCP N/A 12/20/2016   Procedure: ENDOSCOPIC RETROGRADE CHOLANGIOPANCREATOGRAPHY (ERCP);  Surgeon: Teena Irani, MD;  Location: [facility];  Service: Endoscopy;  Laterality: N/A;   HYSTEROTOMY     INTERSTIM IMPLANT PLACEMENT     no longer functioning   LEFT HEART CATH AND CORONARY ANGIOGRAPHY Left 04/13/2018   Procedure: LEFT HEART CATH AND CORONARY ANGIOGRAPHY;  Surgeon: Teodoro Spray, MD;  Location: [place] [facility];  Service: Cardiovascular;  Laterality: Left;   LEFT HEART CATH AND CORONARY ANGIOGRAPHY Left 12/25/2019   Procedure: LEFT HEART CATH AND  CORONARY ANGIOGRAPHY;  Surgeon: Teodoro Spray, MD;  Location: [place] [facility];  Service: Cardiovascular;  Laterality: Left;    Prior to Admission medications   Medication Sig Start Date End Date Taking? Authorizing Provider  atorvastatin (LIPITOR) 40 MG tablet Take 1 tablet (40 mg total) by mouth daily. Take 1 tab po daily 05/28/22  Yes Abernathy, Alyssa, NP  cetirizine (ZYRTEC) 10 MG tablet Take 1 tablet (10 mg total) by mouth daily. 05/28/22  Yes Abernathy, Yetta Flock, NP  citalopram (CELEXA) 40 MG tablet Take 1 tablet (40 mg total) by mouth daily. 05/28/22  Yes Abernathy, Yetta Flock, NP  clopidogrel (PLAVIX) 75 MG tablet Take 75 mg by mouth daily.    Yes [provider]  diltiazem (CARDIZEM CD) 120 MG 24 hr capsule Take 1 capsule (120 mg total) by mouth daily. 10/19/21  Yes Abernathy, Yetta Flock, NP  diltiazem (CARDIZEM) 30 MG tablet Take 30 mg by mouth daily as needed (Heart palpitations).  10/25/19  Yes [provider]  fluticasone (FLONASE) 50 MCG/ACT nasal spray Place 1 spray into both nostrils daily. 10/19/21  Yes Abernathy, Yetta Flock, NP  isosorbide mononitrate (IMDUR) 30 MG 24 hr tablet TAKE 1 TABLET BY MOUTH DAILY 08/30/22  Yes Abernathy, Alyssa, NP  levothyroxine (SYNTHROID) 100 MCG tablet Take 1 tablet (100 mcg total) by mouth daily. 05/28/22  Yes Abernathy, Alyssa, NP  lisinopril (ZESTRIL) 10 MG tablet Take 1 tablet (10 mg total) by mouth daily. 05/28/22  Yes Abernathy, Yetta Flock, NP  metoprolol tartrate (LOPRESSOR) 25  MG tablet Take 12.5 mg by mouth daily.    Yes [provider]  nitroGLYCERIN (NITROSTAT) 0.4 MG SL tablet Place 1 tablet (0.4 mg total) under the tongue every 5 (five) minutes as needed for chest pain. 10/19/21  Yes Abernathy, Alyssa, NP  pantoprazole (PROTONIX) 40 MG tablet TAKE 1 TABLET BY MOUTH TWICE  DAILY 06/07/22  Yes Abernathy, Alyssa, NP  acetaminophen (TYLENOL) 500 MG tablet Take 1,000 mg by mouth every 6 (six) hours as needed for mild pain or headache.     [provider]  albuterol (PROVENTIL) (2.5 MG/3ML) 0.083% nebulizer solution USE 1 VIAL VIA NEBULIZER  EVERY 4 TO 6 HOURS AS  NEEDED Patient not taking: Reported on 09/14/2022 09/07/17   Lavera Guise, MD    Allergies as of 08/31/2022 - Review Complete 08/21/2022  Allergen Reaction Noted   Morphine Other (See Comments) 02/26/2015   Sulfa antibiotics Hives 12/01/2017   Sulfamethoxazole-trimethoprim Swelling 02/26/2015   Azithromycin Diarrhea and Nausea And Vomiting 12/03/2019   Pregabalin Other (See Comments) 02/26/2015   Asa [aspirin] Hives, Nausea Only, and Other (See Comments) 02/03/2015   Rofecoxib Other (See Comments) 02/26/2015    Family History  Problem Relation Age of Onset   CAD Other     Social History   Socioeconomic History   Marital status: Married    Spouse name: Not on file   Number of children: Not on file   Years of education: Not on file   Highest education level: Not on file  Occupational History   Not on file  Tobacco Use   Smoking status: Every Day    Packs/day: 0.50    Years: 51.00    Total pack years: 25.50    Types: Cigarettes   Smokeless tobacco: Never   Tobacco comments:    Started smoking age 53.  was up to two packs a day  Vaping Use   Vaping Use: Never used  Substance and Sexual Activity   Alcohol use: Not Currently    Comment: occasionally    Drug use: Yes    Comment: CBD gummies   Sexual activity: Yes  Other Topics Concern   Not on file  Social History Narrative   Not on file   Social Determinants of Health   Financial Resource Strain: Not on file  Food Insecurity: No Food Insecurity (05/24/2022)   Hunger Vital Sign    Worried About Running Out of Food in the Last Year: Never true    Ran Out of Food in the Last Year: Never true  Transportation Needs: No Transportation Needs (05/24/2022)   PRAPARE - Hydrologist (Medical): No    Lack of Transportation (Non-Medical): No  Physical Activity:  Not on file  Stress: Not on file  Social Connections: Not on file  Intimate Partner Violence: Not At Risk (05/24/2022)   Humiliation, Afraid, Rape, and Kick questionnaire    Fear of Current or Ex-Partner: No    Emotionally Abused: No    Physically Abused: No    Sexually Abused: No    Review of Systems: See HPI, otherwise negative ROS  Physical Exam: Ht 5' (1.524 m)   Wt 61.2 kg   BMI 26.37 kg/m  General:   Alert, cooperative in NAD Head:  Normocephalic and atraumatic. Respiratory:  Normal work of breathing. Cardiovascular:  RRR  Impression/Plan: Carol [name] is here for cataract surgery.  Risks, benefits, limitations, and alternatives regarding cataract surgery have been reviewed with the  patient.  Questions have been answered.  All parties agreeable.   Norvel Richards, MD  09/21/2022, 7:07 AM

## 2022-09-22 ENCOUNTER — Encounter: Payer: Self-pay | Admitting: Ophthalmology

## 2022-09-22 ENCOUNTER — Other Ambulatory Visit: Payer: Self-pay

## 2022-09-22 DIAGNOSIS — H2512 Age-related nuclear cataract, left eye: Secondary | ICD-10-CM | POA: Diagnosis not present

## 2022-10-07 DIAGNOSIS — E782 Mixed hyperlipidemia: Secondary | ICD-10-CM | POA: Diagnosis not present

## 2022-10-07 DIAGNOSIS — I480 Paroxysmal atrial fibrillation: Secondary | ICD-10-CM | POA: Diagnosis not present

## 2022-10-07 DIAGNOSIS — I2089 Other forms of angina pectoris: Secondary | ICD-10-CM | POA: Diagnosis not present

## 2022-10-07 DIAGNOSIS — I2510 Atherosclerotic heart disease of native coronary artery without angina pectoris: Secondary | ICD-10-CM | POA: Diagnosis not present

## 2022-10-07 DIAGNOSIS — Z720 Tobacco use: Secondary | ICD-10-CM | POA: Diagnosis not present

## 2022-10-07 DIAGNOSIS — R002 Palpitations: Secondary | ICD-10-CM | POA: Diagnosis not present

## 2022-10-07 DIAGNOSIS — Z955 Presence of coronary angioplasty implant and graft: Secondary | ICD-10-CM | POA: Diagnosis not present

## 2022-10-07 DIAGNOSIS — I471 Supraventricular tachycardia, unspecified: Secondary | ICD-10-CM | POA: Diagnosis not present

## 2022-10-07 DIAGNOSIS — I10 Essential (primary) hypertension: Secondary | ICD-10-CM | POA: Diagnosis not present

## 2022-10-18 DIAGNOSIS — Z955 Presence of coronary angioplasty implant and graft: Secondary | ICD-10-CM | POA: Diagnosis not present

## 2022-10-18 DIAGNOSIS — I2510 Atherosclerotic heart disease of native coronary artery without angina pectoris: Secondary | ICD-10-CM | POA: Diagnosis not present

## 2022-10-18 DIAGNOSIS — R002 Palpitations: Secondary | ICD-10-CM | POA: Diagnosis not present

## 2022-10-26 DIAGNOSIS — E782 Mixed hyperlipidemia: Secondary | ICD-10-CM | POA: Diagnosis not present

## 2022-10-26 DIAGNOSIS — I10 Essential (primary) hypertension: Secondary | ICD-10-CM | POA: Diagnosis not present

## 2022-10-26 DIAGNOSIS — I69319 Unspecified symptoms and signs involving cognitive functions following cerebral infarction: Secondary | ICD-10-CM | POA: Diagnosis not present

## 2022-10-26 DIAGNOSIS — Z955 Presence of coronary angioplasty implant and graft: Secondary | ICD-10-CM | POA: Diagnosis not present

## 2022-10-26 DIAGNOSIS — I471 Supraventricular tachycardia, unspecified: Secondary | ICD-10-CM | POA: Diagnosis not present

## 2022-10-26 DIAGNOSIS — I2510 Atherosclerotic heart disease of native coronary artery without angina pectoris: Secondary | ICD-10-CM | POA: Diagnosis not present

## 2022-10-26 DIAGNOSIS — R002 Palpitations: Secondary | ICD-10-CM | POA: Diagnosis not present

## 2022-10-26 DIAGNOSIS — Z720 Tobacco use: Secondary | ICD-10-CM | POA: Diagnosis not present

## 2022-10-26 DIAGNOSIS — I2089 Other forms of angina pectoris: Secondary | ICD-10-CM | POA: Diagnosis not present

## 2022-10-28 ENCOUNTER — Ambulatory Visit: Admission: RE | Admit: 2022-10-28 | Payer: Medicare Other | Source: Home / Self Care | Admitting: Ophthalmology

## 2022-10-28 SURGERY — PHACOEMULSIFICATION, CATARACT, WITH IOL INSERTION
Anesthesia: Topical | Laterality: Left

## 2022-11-09 ENCOUNTER — Other Ambulatory Visit: Payer: Self-pay | Admitting: Nurse Practitioner

## 2022-11-09 DIAGNOSIS — K219 Gastro-esophageal reflux disease without esophagitis: Secondary | ICD-10-CM

## 2022-11-23 ENCOUNTER — Ambulatory Visit: Payer: Self-pay

## 2022-11-23 NOTE — Patient Instructions (Signed)
Visit Information  Thank you for taking time to visit with me today. Please don't hesitate to contact me if I can be of assistance to you.   Following are the goals we discussed today:   Goals Addressed   None     No further follow up required: declined services per patient request at this time but is informed to contact provider if she would like to access [facility].      Lysle Morales, BSW Social Worker [facility] Care Management  [phone]

## 2022-11-23 NOTE — Patient Outreach (Signed)
  Care Coordination   Initial Visit Note   11/23/2022 Name: ROSELEA POZZA MRN: [id number] DOB: 1955/08/17  ANNET REHLING is a 67 y.o. year old female who sees Sallyanne Kuster, NP for primary care. I spoke with  Derryl Harbor by phone today.  What matters to the patients health and wellness today?  Patient reports no concerns with taking care of her health needs and does not need [facility] services at this time.      Goals Addressed   None     SDOH assessments and interventions completed:  Yes  SDOH Interventions Today    Flowsheet Row Most Recent Value  SDOH Interventions   Food Insecurity Interventions Intervention Not Indicated  Transportation Interventions Intervention Not Indicated        Care Coordination Interventions:  Yes, provided   Follow up plan: No further intervention required. Advised to follow up with provider if she should need [facility] services.  Encounter Outcome:  Pt. Visit Completed   Lysle Morales, BSW Social Worker [facility] Care Management  [phone]

## 2022-12-15 ENCOUNTER — Other Ambulatory Visit: Payer: Self-pay | Admitting: Orthopedic Surgery

## 2022-12-15 DIAGNOSIS — M4807 Spinal stenosis, lumbosacral region: Secondary | ICD-10-CM | POA: Diagnosis not present

## 2022-12-15 DIAGNOSIS — M48062 Spinal stenosis, lumbar region with neurogenic claudication: Secondary | ICD-10-CM | POA: Diagnosis not present

## 2022-12-15 DIAGNOSIS — M5489 Other dorsalgia: Secondary | ICD-10-CM | POA: Diagnosis not present

## 2022-12-15 DIAGNOSIS — G622 Polyneuropathy due to other toxic agents: Secondary | ICD-10-CM | POA: Diagnosis not present

## 2022-12-16 ENCOUNTER — Encounter: Payer: Self-pay | Admitting: Orthopedic Surgery

## 2022-12-20 ENCOUNTER — Ambulatory Visit
Admission: RE | Admit: 2022-12-20 | Discharge: 2022-12-20 | Disposition: A | Discharge status: Home / Self Care | Payer: Medicare Other | Source: Ambulatory Visit | Attending: Orthopedic Surgery | Admitting: Orthopedic Surgery

## 2022-12-20 DIAGNOSIS — I700 Atherosclerosis of aorta: Secondary | ICD-10-CM | POA: Diagnosis not present

## 2022-12-20 DIAGNOSIS — M79606 Pain in leg, unspecified: Secondary | ICD-10-CM | POA: Diagnosis not present

## 2022-12-20 DIAGNOSIS — M4807 Spinal stenosis, lumbosacral region: Secondary | ICD-10-CM | POA: Diagnosis not present

## 2022-12-27 NOTE — Progress Notes (Signed)
[facility] Quality Team Note  Name: Carol [name] Date of Birth: [birth year] MRN: [id number] Date: 12/27/2022  [facility] Quality Team has reviewed this patient's chart, please see recommendations below:  [facility] Quality Other; (BCS GAP- BREAST CANCER SCREENING, PATIENT DUE FOR MAMMOGRAM. PLEASE ADDRESS AT NEXT OFFICE VISIT WITH PCP. [facility] QUALITY COORDINATOR HAPPY TO ASSIST IN ANY WAY)

## 2023-01-04 DIAGNOSIS — G8929 Other chronic pain: Secondary | ICD-10-CM | POA: Diagnosis not present

## 2023-01-04 DIAGNOSIS — M5416 Radiculopathy, lumbar region: Secondary | ICD-10-CM | POA: Diagnosis not present

## 2023-01-04 DIAGNOSIS — M5441 Lumbago with sciatica, right side: Secondary | ICD-10-CM | POA: Diagnosis not present

## 2023-01-04 DIAGNOSIS — M5442 Lumbago with sciatica, left side: Secondary | ICD-10-CM | POA: Diagnosis not present

## 2023-01-17 DIAGNOSIS — M5416 Radiculopathy, lumbar region: Secondary | ICD-10-CM | POA: Diagnosis not present

## 2023-02-03 DIAGNOSIS — I2510 Atherosclerotic heart disease of native coronary artery without angina pectoris: Secondary | ICD-10-CM | POA: Diagnosis not present

## 2023-02-03 DIAGNOSIS — I471 Supraventricular tachycardia, unspecified: Secondary | ICD-10-CM | POA: Diagnosis not present

## 2023-02-03 DIAGNOSIS — R002 Palpitations: Secondary | ICD-10-CM | POA: Diagnosis not present

## 2023-02-03 DIAGNOSIS — I10 Essential (primary) hypertension: Secondary | ICD-10-CM | POA: Diagnosis not present

## 2023-02-03 DIAGNOSIS — Z955 Presence of coronary angioplasty implant and graft: Secondary | ICD-10-CM | POA: Diagnosis not present

## 2023-02-14 ENCOUNTER — Ambulatory Visit: Payer: Medicare Other | Admitting: Nurse Practitioner

## 2023-02-26 ENCOUNTER — Other Ambulatory Visit: Payer: Self-pay | Admitting: Nurse Practitioner

## 2023-02-26 DIAGNOSIS — Z760 Encounter for issue of repeat prescription: Secondary | ICD-10-CM

## 2023-02-28 ENCOUNTER — Other Ambulatory Visit: Payer: Self-pay

## 2023-02-28 DIAGNOSIS — Z760 Encounter for issue of repeat prescription: Secondary | ICD-10-CM

## 2023-02-28 MED ORDER — LISINOPRIL 10 MG PO TABS
10.0000 mg | ORAL_TABLET | Freq: Every day | ORAL | 0 refills | Status: AC
Start: 2023-02-28 — End: ?

## 2023-04-04 ENCOUNTER — Emergency Department
Admission: EM | Admit: 2023-04-04 | Discharge: 2023-04-04 | Disposition: A | Discharge status: Home / Self Care | Payer: Medicare Other | Attending: Emergency Medicine | Admitting: Emergency Medicine

## 2023-04-04 ENCOUNTER — Encounter: Payer: Self-pay | Admitting: Emergency Medicine

## 2023-04-04 ENCOUNTER — Other Ambulatory Visit: Payer: Self-pay

## 2023-04-04 ENCOUNTER — Emergency Department: Payer: Medicare Other

## 2023-04-04 DIAGNOSIS — I2510 Atherosclerotic heart disease of native coronary artery without angina pectoris: Secondary | ICD-10-CM | POA: Insufficient documentation

## 2023-04-04 DIAGNOSIS — I10 Essential (primary) hypertension: Secondary | ICD-10-CM | POA: Insufficient documentation

## 2023-04-04 DIAGNOSIS — R0789 Other chest pain: Secondary | ICD-10-CM | POA: Diagnosis not present

## 2023-04-04 LAB — BASIC METABOLIC PANEL
Anion gap: 7 (ref 5–15)
BUN: 13 mg/dL (ref 8–23)
CO2: 20 mmol/L — ABNORMAL LOW (ref 22–32)
Calcium: 8.9 mg/dL (ref 8.9–10.3)
Chloride: 109 mmol/L (ref 98–111)
Creatinine, Ser: 0.7 mg/dL (ref 0.44–1.00)
GFR, Estimated: >60 mL/min (ref >60)
Glucose, Bld: 91 mg/dL (ref 70–99)
Potassium: 3.8 mmol/L (ref 3.5–5.1)
Sodium: 136 mmol/L (ref 135–145)

## 2023-04-04 LAB — CBC
HCT: 35.9 % — ABNORMAL LOW (ref 36.0–46.0)
Hemoglobin: 11.5 g/dL — ABNORMAL LOW (ref 12.0–15.0)
MCH: 30.7 pg (ref 26.0–34.0)
MCHC: 32 g/dL (ref 30.0–36.0)
MCV: 95.7 fL (ref 80.0–100.0)
Platelets: 220 10*3/uL (ref 150–400)
RBC: 3.75 MIL/uL — ABNORMAL LOW (ref 3.87–5.11)
RDW: 13.2 % (ref 11.5–15.5)
WBC: 7.3 10*3/uL (ref 4.0–10.5)
nRBC: 0 % (ref 0.0–0.2)

## 2023-04-04 LAB — TROPONIN I (HIGH SENSITIVITY): Troponin I (High Sensitivity): 4 ng/L (ref <18)

## 2023-04-04 NOTE — Discharge Instructions (Signed)
You are seen in the emergency department for elevated blood pressure.  Have a low suspicion that you had a stroke, your neurologic exam was normal.  Your blood pressure improved on its own while in the emergency department.  Your lab work was overall normal.  Your heart enzyme (troponin) was normal, have a low suspicion that you had a heart attack today.  Take your blood pressure medication as prescribed.  Call your primary care physician first thing in the morning to schedule close follow-up appointment.  Return if you have any return of symptoms.

## 2023-04-04 NOTE — ED Triage Notes (Addendum)
Pt to ED via POV for high blood pressure. Pt states that her BP was 180/80-something. Pt states that last time she took her blood pressure it was 164/78. Pt states that when her blood pressure was high she felt shaky and was getting hot. Pt is currently in NAD.  Pt states that she is on medication for her blood pressure. Pt states that she took her BP medication after she noticed her BP was high.

## 2023-04-04 NOTE — ED Notes (Signed)
Pt A&O x4, no obvious distress noted, respirations regular/unlabored. Pt verbalizes understanding of discharge instructions. Pt able to ambulate from ED independently.   

## 2023-04-04 NOTE — ED Provider Notes (Addendum)
**Note Carol-Identified via Obfuscation** [name] Note    Event Date/Time   First MD Initiated Contact with Patient 04/04/23 1509     (approximate)   History   Hypertension   HPI  Carol [name] is a 67 y.o. female past medical history significant for hypertension, prior CVA, SVT, CAD, presents to the emergency department with episode of hypertension.  States that she woke up this morning was in her normal state of health.  Had a sudden wave of feeling like she had a heart flash.  Thought it might be a drop in her glucose so she started eating peanut butter.  Checked her blood pressure and it was elevated at 180s.  States that she tried to relax and took it again multiple times and it continued to be elevated with a systolic blood pressure of 160.  Mild chest tightness at that time that has since resolved.  States that she no longer has any discomfort or hot flash feeling but is just feeling tired.  No new medications.  No changes of her medications.  Has been compliant with them with no change of diet.  No recent falls or trauma.  No headache, change in vision, slurring of her speech, dizziness or gait instability.  Denies any ongoing chest pain or shortness of breath.  Denies any abdominal pain, nausea or vomiting.  Denies any dysuria.     Physical Exam   Triage Vital Signs: ED Triage Vitals  Encounter Vitals Group     BP 04/04/23 1408 (!) 151/60     Systolic BP Percentile --      Diastolic BP Percentile --      Pulse Rate 04/04/23 1407 (!) 55     Resp 04/04/23 1411 16     Temp 04/04/23 1407 (!) 97.5 F (36.4 C)     Temp Source 04/04/23 1407 Oral     SpO2 04/04/23 1407 97 %     Weight 04/04/23 1408 135 lb (61.2 kg)     Height 04/04/23 1408 5' (1.524 m)     Head Circumference --      Peak Flow --      Pain Score 04/04/23 1408 4     Pain Loc --      Pain Education --      Exclude from Growth Chart --     Most recent vital signs: Vitals:   04/04/23 1408 04/04/23 1411  BP: (!)  151/60   Pulse:    Resp:  16  Temp:    SpO2:      Physical Exam Constitutional:      Appearance: She is well-developed.  HENT:     Head: Atraumatic.  Eyes:     Conjunctiva/sclera: Conjunctivae normal.  Cardiovascular:     Rate and Rhythm: Regular rhythm.     Heart sounds: No murmur heard. Pulmonary:     Effort: No respiratory distress.  Abdominal:     General: There is no distension.     Tenderness: There is no abdominal tenderness.  Musculoskeletal:        General: Normal range of motion.     Cervical back: Normal range of motion.  Skin:    General: Skin is warm.  Neurological:     Mental Status: She is alert. Mental status is at baseline.     GCS: GCS eye subscore is 4. GCS verbal subscore is 5. GCS motor subscore is 6.     Cranial Nerves: Cranial nerves 2-12 are intact.  Sensory: Sensation is intact.     Motor: Motor function is intact.     Gait: Gait is intact.     IMPRESSION / MDM / ASSESSMENT AND PLAN / ED COURSE  I reviewed the triage vital signs and the nursing notes.  Differential diagnosis including hypertension, TIA, ACS, electrolyte abnormality, dehydration  EKG  I, Corena Herter, the attending physician, personally viewed and interpreted this ECG.   Rate: 54  Rhythm: Sinus bradycardia  Axis: Normal  Intervals: Normal  ST&T Change: None  No tachycardic or bradycardic dysrhythmias while on cardiac telemetry.  RADIOLOGY my interpretation of imaging: No signs of pneumonia.  No widened mediastinum.  LABS (all labs ordered are listed, but only abnormal results are displayed) Labs interpreted as -    Labs Reviewed  BASIC METABOLIC PANEL - Abnormal; Notable for the following components:      Result Value   CO2 20 (*)    All other components within normal limits  CBC - Abnormal; Notable for the following components:   RBC 3.75 (*)    Hemoglobin 11.5 (*)    HCT 35.9 (*)    All other components within normal limits  TROPONIN I (HIGH  SENSITIVITY)  TROPONIN I (HIGH SENSITIVITY)     MDM  Patient currently has a normal neurologic exam, have a low suspicion for CVA.  No headache and no sudden onset of headache, have low suspicion for subarachnoid hemorrhage.  No gait instability and he has a normal neurologic exam in the emergency department.  Low suspicion for ACS, initial troponin was negative, no ongoing chest pain.  Creatinine at baseline with no significant electrolyte abnormalities.  Blood pressure improved while in the emergency department.  Discussed close follow-up with primary care provider.  Given return precautions for any new return of symptoms.  No questions at time of discharge.  Patient has uncontrolled hypertension however has antihypertensive medications and follows with a primary care physician.  Discussed follow-up with 1 week for blood pressure recheck and continue on her antihypertensive medications.     PROCEDURES:  Critical Care performed: No  Procedures  Patient's presentation is most consistent with acute complicated illness / injury requiring diagnostic workup.   MEDICATIONS ORDERED IN ED: Medications - No data to display  FINAL CLINICAL IMPRESSION(S) / ED DIAGNOSES   Final diagnoses:  None     Rx / DC Orders   ED Discharge Orders     None        Note:  This document was prepared using Dragon voice recognition software and may include unintentional dictation errors.   Corena Herter, MD 04/04/23 1551    Corena Herter, MD 04/04/23 1553

## 2023-04-05 ENCOUNTER — Telehealth: Payer: Self-pay | Admitting: Nurse Practitioner

## 2023-04-05 NOTE — Telephone Encounter (Signed)
Called patient to schedule ED follow up. She stated she is now going to other pcp. Self-discharged-Toni

## 2023-04-06 DIAGNOSIS — M797 Fibromyalgia: Secondary | ICD-10-CM | POA: Diagnosis not present

## 2023-04-06 DIAGNOSIS — D509 Iron deficiency anemia, unspecified: Secondary | ICD-10-CM | POA: Diagnosis not present

## 2023-04-06 DIAGNOSIS — R000 Tachycardia, unspecified: Secondary | ICD-10-CM | POA: Diagnosis not present

## 2023-04-06 DIAGNOSIS — Z0130 Encounter for examination of blood pressure without abnormal findings: Secondary | ICD-10-CM | POA: Diagnosis not present

## 2023-04-06 DIAGNOSIS — Z1389 Encounter for screening for other disorder: Secondary | ICD-10-CM | POA: Diagnosis not present

## 2023-04-06 DIAGNOSIS — Z23 Encounter for immunization: Secondary | ICD-10-CM | POA: Diagnosis not present

## 2023-04-06 DIAGNOSIS — E785 Hyperlipidemia, unspecified: Secondary | ICD-10-CM | POA: Diagnosis not present

## 2023-04-06 DIAGNOSIS — I10 Essential (primary) hypertension: Secondary | ICD-10-CM | POA: Diagnosis not present

## 2023-04-06 DIAGNOSIS — E559 Vitamin D deficiency, unspecified: Secondary | ICD-10-CM | POA: Diagnosis not present

## 2023-04-06 DIAGNOSIS — I25118 Atherosclerotic heart disease of native coronary artery with other forms of angina pectoris: Secondary | ICD-10-CM | POA: Diagnosis not present

## 2023-04-06 DIAGNOSIS — E039 Hypothyroidism, unspecified: Secondary | ICD-10-CM | POA: Diagnosis not present

## 2023-04-07 ENCOUNTER — Other Ambulatory Visit: Payer: Self-pay | Admitting: Family Medicine

## 2023-04-07 DIAGNOSIS — Z780 Asymptomatic menopausal state: Secondary | ICD-10-CM

## 2023-04-24 ENCOUNTER — Other Ambulatory Visit: Payer: Self-pay | Admitting: Nurse Practitioner

## 2023-04-24 DIAGNOSIS — Z760 Encounter for issue of repeat prescription: Secondary | ICD-10-CM

## 2023-05-04 DIAGNOSIS — Z0131 Encounter for examination of blood pressure with abnormal findings: Secondary | ICD-10-CM | POA: Diagnosis not present

## 2023-05-04 DIAGNOSIS — Z0130 Encounter for examination of blood pressure without abnormal findings: Secondary | ICD-10-CM | POA: Diagnosis not present

## 2023-05-04 DIAGNOSIS — Z1389 Encounter for screening for other disorder: Secondary | ICD-10-CM | POA: Diagnosis not present

## 2023-05-04 DIAGNOSIS — J069 Acute upper respiratory infection, unspecified: Secondary | ICD-10-CM | POA: Diagnosis not present

## 2023-05-09 ENCOUNTER — Other Ambulatory Visit: Payer: Self-pay | Admitting: Nurse Practitioner

## 2023-05-09 DIAGNOSIS — Z760 Encounter for issue of repeat prescription: Secondary | ICD-10-CM

## 2023-05-31 ENCOUNTER — Ambulatory Visit
Admission: RE | Admit: 2023-05-31 | Discharge: 2023-05-31 | Disposition: A | Discharge status: Home / Self Care | Payer: Medicare Other | Source: Ambulatory Visit | Attending: Emergency Medicine | Admitting: Emergency Medicine

## 2023-05-31 VITALS — BP 128/72 | HR 56 | Temp 97.8°F | Resp 18

## 2023-05-31 DIAGNOSIS — W57XXXA Bitten or stung by nonvenomous insect and other nonvenomous arthropods, initial encounter: Secondary | ICD-10-CM

## 2023-05-31 DIAGNOSIS — S80861A Insect bite (nonvenomous), right lower leg, initial encounter: Secondary | ICD-10-CM | POA: Diagnosis not present

## 2023-05-31 DIAGNOSIS — R21 Rash and other nonspecific skin eruption: Secondary | ICD-10-CM | POA: Diagnosis not present

## 2023-05-31 MED ORDER — MUPIROCIN CALCIUM 2 % EX CREA: 1.0000 | TOPICAL_CREAM | Freq: Two times a day (BID) | CUTANEOUS | 0 refills | Status: AC

## 2023-05-31 NOTE — ED Provider Notes (Signed)
[name]    CSN: [id number] Arrival date & time: 05/31/23  1439      History   Chief Complaint Chief Complaint  Patient presents with   Insect Bite    Inflamed, will not heal - Entered by patient    HPI Carol [name] is a 67 y.o. female.   67 year old female, Carol [name], presents to urgent care for evaluation of possible spider/insect bite to right lower shin approximate 1 week ago.  Patient states she is not sure if it was a mole or some bug bit her.  Patient states she cut it and has been self treating with antibiotic ointment and peroxide, here to get the area checked out.  Patient has no fever, nausea, vomiting or erythema noted.  The history is provided by the patient. No language interpreter was used.    Past Medical History:  Diagnosis Date   Bipolar affective disorder (HCC)    Coronary artery disease    GERD (gastroesophageal reflux disease)    Hyperlipidemia    Hypertension    Hypothyroid    Interstitial cystitis    Myocardial infarction (HCC) 2018   Stroke (HCC) 11/15/2016   "right side weaker since" (12/20/2016)   Stroke (HCC) 05/2022   No deficits   Wears dentures    full upper and lower   Wears hearing aid in right ear     Patient Active Problem List   Diagnosis Date Noted   Skin eruption 05/31/2023   Insect bite of right lower leg 05/31/2023   CVA (cerebral vascular accident) (HCC) 05/24/2022   Acute non-recurrent frontal sinusitis 01/24/2020   Seasonal allergic rhinitis due to pollen 01/24/2020   Acute upper respiratory infection 12/08/2019   Intractable vomiting 12/08/2019   S/P drug eluting coronary stent placement 04/13/2018   SVT (supraventricular tachycardia) (HCC) 04/10/2018   Hyperlipidemia, unspecified 12/01/2017   Choledocholithiasis with acute cholecystitis 12/19/2016   Coronary artery disease 12/19/2016   Essential hypertension 12/19/2016   Hypothyroidism 12/19/2016   History of stroke 12/19/2016   Migraine 06/17/2016    Cognitive deficit, post-stroke 05/18/2015   Chest pain 05/12/2015   Right facial numbness 03/30/2015   Lithium intoxication 02/27/2015   Bipolar disorder (HCC) 02/27/2015   Cerebral infarction (HCC) 02/26/2015   Unstable angina (HCC) 02/03/2015   Tobacco use 01/19/2013   Temporomandibular joint disorder 01/02/2013   Tarsal tunnel syndrome 07/21/2012   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   Plantar fascial fibromatosis 07/21/2012   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    Past Surgical History:  Procedure Laterality Date   ABDOMINAL HYSTERECTOMY     APPENDECTOMY     CARDIAC CATHETERIZATION Left 09/26/2015   Procedure: Left Heart Cath and Coronary Angiography;  Surgeon: Dalia Heading, MD;  Location: [facility];  Service: Cardiovascular;  Laterality: Left;   CATARACT EXTRACTION W/PHACO Right 09/21/2022   Procedure: CATARACT EXTRACTION PHACO AND INTRAOCULAR LENS PLACEMENT (IOC) RIGHT 6.83 00:48.0;  Surgeon: Estanislado Pandy, MD;  Location: [facility];  Service: Ophthalmology;  Laterality: Right;   CHOLECYSTECTOMY N/A 12/22/2016   Procedure: LAPAROSCOPIC CHOLECYSTECTOMY WITH INTRAOPERATIVE CHOLANGIOGRAM;  Surgeon: Griselda Miner, MD;  Location: [facility] OR;  Service: General;  Laterality: N/A;   CORONARY STENT INTERVENTION N/A 04/13/2018   Procedure: CORONARY STENT INTERVENTION;  Surgeon: Alwyn Pea, MD;  Location: [facility];  Service: Cardiovascular;  Laterality: N/A;   ERCP N/A 12/20/2016   Procedure: ENDOSCOPIC RETROGRADE CHOLANGIOPANCREATOGRAPHY (  ERCP);  Surgeon: Dorena Cookey, MD;  Location: [facility] ENDOSCOPY;  Service: Endoscopy;  Laterality: N/A;   HYSTEROTOMY     INTERSTIM IMPLANT PLACEMENT     no longer functioning   LEFT HEART CATH AND CORONARY ANGIOGRAPHY Left 04/13/2018   Procedure: LEFT HEART CATH AND CORONARY ANGIOGRAPHY;  Surgeon: Dalia Heading, MD;  Location: [facility];  Service:  Cardiovascular;  Laterality: Left;   LEFT HEART CATH AND CORONARY ANGIOGRAPHY Left 12/25/2019   Procedure: LEFT HEART CATH AND CORONARY ANGIOGRAPHY;  Surgeon: Dalia Heading, MD;  Location: [facility];  Service: Cardiovascular;  Laterality: Left;    OB History   No obstetric history on file.      Home Medications    Prior to Admission medications   Medication Sig Start Date End Date Taking? Authorizing Provider  mupirocin cream (BACTROBAN) 2 % Apply 1 Application topically 2 (two) times daily for 7 days. 05/31/23 06/07/23 Yes Jaden Batchelder, Para March, NP  acetaminophen (TYLENOL) 500 MG tablet Take 1,000 mg by mouth every 6 (six) hours as needed for mild pain or headache.    [provider]  albuterol (PROVENTIL) (2.5 MG/3ML) 0.083% nebulizer solution USE 1 VIAL VIA NEBULIZER  EVERY 4 TO 6 HOURS AS  NEEDED Patient not taking: Reported on 09/14/2022 09/07/17   Lyndon Code, MD  atorvastatin (LIPITOR) 40 MG tablet Take 1 tablet (40 mg total) by mouth daily. Take 1 tab po daily 05/28/22   Sallyanne Kuster, NP  cetirizine (ZYRTEC) 10 MG tablet Take 1 tablet (10 mg total) by mouth daily. 05/28/22   Sallyanne Kuster, NP  citalopram (CELEXA) 40 MG tablet Take 1 tablet (40 mg total) by mouth daily. 05/28/22   Sallyanne Kuster, NP  clopidogrel (PLAVIX) 75 MG tablet Take 75 mg by mouth daily.     [provider]  diltiazem (CARDIZEM CD) 120 MG 24 hr capsule Take 1 capsule (120 mg total) by mouth daily. 10/19/21   Sallyanne Kuster, NP  diltiazem (CARDIZEM) 30 MG tablet Take 30 mg by mouth daily as needed (Heart palpitations).  10/25/19   [provider]  fluticasone (FLONASE) 50 MCG/ACT nasal spray Place 1 spray into both nostrils daily. 10/19/21   Sallyanne Kuster, NP  isosorbide mononitrate (IMDUR) 30 MG 24 hr tablet TAKE 1 TABLET BY MOUTH DAILY 11/09/22   Lyndon Code, MD  levothyroxine (SYNTHROID) 100 MCG tablet Take 1 tablet (100 mcg total) by mouth daily. 05/28/22    Sallyanne Kuster, NP  lisinopril (ZESTRIL) 10 MG tablet Take 1 tablet (10 mg total) by mouth daily. 02/28/23   Sallyanne Kuster, NP  metoprolol tartrate (LOPRESSOR) 25 MG tablet Take 12.5 mg by mouth daily.     [provider]  nitroGLYCERIN (NITROSTAT) 0.4 MG SL tablet Place 1 tablet (0.4 mg total) under the tongue every 5 (five) minutes as needed for chest pain. 10/19/21   Sallyanne Kuster, NP  pantoprazole (PROTONIX) 40 MG tablet TAKE 1 TABLET BY MOUTH TWICE  DAILY 11/09/22   Lyndon Code, MD    Family History Family History  Problem Relation Age of Onset   CAD Other     Social History Social History   Tobacco Use   Smoking status: Every Day    Current packs/day: 0.50    Average packs/day: 0.5 packs/day for 51.0 years (25.5 ttl pk-yrs)    Types: Cigarettes   Smokeless tobacco: Never   Tobacco comments:    Started smoking age 25.  was up  to two packs a day  Vaping Use   Vaping status: Never Used  Substance Use Topics   Alcohol use: Not Currently    Comment: occasionally    Drug use: Yes    Comment: CBD gummies     Allergies   Morphine, Sulfa antibiotics, Sulfamethoxazole-trimethoprim, Azithromycin, Pregabalin, Asa [aspirin], and Rofecoxib   Review of Systems Review of Systems  Constitutional:  Negative for fever.  Skin:  Positive for color change and wound.  All other systems reviewed and are negative.    Physical Exam Triage Vital Signs ED Triage Vitals  Encounter Vitals Group     BP 05/31/23 1503 128/72     Systolic BP Percentile --      Diastolic BP Percentile --      Pulse Rate 05/31/23 1503 (!) 56     Resp 05/31/23 1503 18     Temp 05/31/23 1503 97.8 F (36.6 C)     Temp src --      SpO2 05/31/23 1503 97 %     Weight --      Height --      Head Circumference --      Peak Flow --      Pain Score 05/31/23 1505 7     Pain Loc --      Pain Education --      Exclude from Growth Chart --    No data found.  Updated Vital Signs BP 128/72    Pulse (!) 56   Temp 97.8 F (36.6 C)   Resp 18   SpO2 97%   Visual Acuity Right Eye Distance:   Left Eye Distance:   Bilateral Distance:    Right Eye Near:   Left Eye Near:    Bilateral Near:     Physical Exam Vitals and nursing note reviewed.  Constitutional:      General: She is not in acute distress.    Appearance: She is well-developed and well-groomed.  HENT:     Head: Normocephalic and atraumatic.  Eyes:     Conjunctiva/sclera: Conjunctivae normal.  Cardiovascular:     Rate and Rhythm: Normal rate and regular rhythm.     Pulses: Normal pulses.     Heart sounds: Normal heart sounds. No murmur heard. Pulmonary:     Effort: Pulmonary effort is normal. No respiratory distress.     Breath sounds: Normal breath sounds and air entry.  Abdominal:     Palpations: Abdomen is soft.     Tenderness: There is no abdominal tenderness.  Musculoskeletal:        General: No swelling.     Cervical back: Neck supple.  Skin:    General: Skin is warm and dry.     Capillary Refill: Capillary refill takes less than 2 seconds.     Findings: Erythema and wound present.       Neurological:     General: No focal deficit present.     Mental Status: She is alert and oriented to person, place, and time.     GCS: GCS eye subscore is 4. GCS verbal subscore is 5. GCS motor subscore is 6.     Cranial Nerves: No cranial nerve deficit.     Sensory: No sensory deficit.  Psychiatric:        Attention and Perception: Attention normal.        Mood and Affect: Mood normal.        Speech: Speech normal.  Behavior: Behavior normal. Behavior is cooperative.      UC Treatments / Results  Labs (all labs ordered are listed, but only abnormal results are displayed) Labs Reviewed - No data to display  EKG   Radiology No results found.  Procedures Procedures (including critical care time)  Medications Ordered in UC Medications - No data to display  Initial Impression /  Assessment and Plan / UC Course  I have reviewed the triage vital signs and the nursing notes.  Pertinent labs & imaging results that were available during my care of the patient were reviewed by me and considered in my medical decision making (see chart for details).     Ddx: Insect bite, skin eruption,mole, mass Final Clinical Impressions(s) / UC Diagnoses   Final diagnoses:  Skin eruption  Insect bite of right lower leg, initial encounter     Discharge Instructions      Take mupirocin as directed, cover with Band-Aid.  Please follow-up with PCP in 3 days for wound check.  If you develop muscle aches ,nausea, vomiting, fever, streaking etc. go to the emergency room for further evaluation     ED Prescriptions     Medication Sig Dispense Auth. Provider   mupirocin cream (BACTROBAN) 2 % Apply 1 Application topically 2 (two) times daily for 7 days. 15 g Barba Solt, Para March, NP      PDMP not reviewed this encounter.   Clancy Gourd, NP 05/31/23 1558

## 2023-05-31 NOTE — ED Triage Notes (Signed)
Patient presents to UC for spider bite to right shin x 1 week ago. Redness and warmth 6 days ago. Self-treated with antibiotic ointment, peroxide, and trying to drain. No relief.   Denies fever.

## 2023-05-31 NOTE — Discharge Instructions (Addendum)
Take mupirocin as directed, cover with Band-Aid.  Please follow-up with PCP in 3 days for wound check.  If you develop muscle aches ,nausea, vomiting, fever, streaking etc. go to the emergency room for further evaluation

## 2023-07-06 ENCOUNTER — Other Ambulatory Visit: Payer: Medicare Other

## 2023-09-20 DIAGNOSIS — Z1389 Encounter for screening for other disorder: Secondary | ICD-10-CM | POA: Diagnosis not present

## 2023-09-20 DIAGNOSIS — Z0130 Encounter for examination of blood pressure without abnormal findings: Secondary | ICD-10-CM | POA: Diagnosis not present

## 2023-09-20 DIAGNOSIS — M797 Fibromyalgia: Secondary | ICD-10-CM | POA: Diagnosis not present

## 2023-09-20 DIAGNOSIS — Z0131 Encounter for examination of blood pressure with abnormal findings: Secondary | ICD-10-CM | POA: Diagnosis not present

## 2023-09-26 ENCOUNTER — Encounter: Payer: Self-pay | Admitting: Emergency Medicine

## 2023-10-20 ENCOUNTER — Ambulatory Visit
Admission: RE | Admit: 2023-10-20 | Discharge: 2023-10-20 | Disposition: A | Discharge status: Home / Self Care | Source: Ambulatory Visit | Attending: Emergency Medicine | Admitting: Emergency Medicine

## 2023-10-20 ENCOUNTER — Other Ambulatory Visit: Payer: Self-pay

## 2023-10-20 VITALS — BP 161/79 | HR 55 | Temp 97.7°F | Resp 18

## 2023-10-20 DIAGNOSIS — J069 Acute upper respiratory infection, unspecified: Secondary | ICD-10-CM | POA: Diagnosis not present

## 2023-10-20 LAB — POC COVID19/FLU A&B COMBO
Covid Antigen, POC: NEGATIVE
Influenza A Antigen, POC: NEGATIVE
Influenza B Antigen, POC: NEGATIVE

## 2023-10-20 MED ORDER — BENZONATATE 100 MG PO CAPS: 100.0000 mg | ORAL_CAPSULE | Freq: Three times a day (TID) | ORAL | 0 refills | Status: DC

## 2023-10-20 MED ORDER — AMOXICILLIN 500 MG PO CAPS: 500.0000 mg | ORAL_CAPSULE | Freq: Two times a day (BID) | ORAL | 0 refills | Status: AC

## 2023-10-20 MED ORDER — ALBUTEROL SULFATE HFA 108 (90 BASE) MCG/ACT IN AERS: 2.0000 | INHALATION_SPRAY | RESPIRATORY_TRACT | 0 refills | Status: DC | PRN

## 2023-10-20 MED ORDER — PREDNISONE 10 MG (21) PO TBPK: ORAL_TABLET | Freq: Every day | ORAL | 0 refills | Status: DC

## 2023-10-20 MED ORDER — PROMETHAZINE-DM 6.25-15 MG/5ML PO SYRP: 2.5000 mL | ORAL_SOLUTION | Freq: Every evening | ORAL | 0 refills | Status: DC | PRN

## 2023-10-20 NOTE — ED Provider Notes (Signed)
 Carol [name]    CSN: [id number] Arrival date & time: 10/20/23  1414      History   Chief Complaint Chief Complaint  Patient presents with   Chills    Fever and chills flu? - Entered by patient   URI    HPI KIA Carol [name] is a 68 y.o. female.   Presents for evaluation of subjective fever, nasal congestion, rhinorrhea, productive cough with brown to green sputum, scratchy throat, shortness of breath with exertion and wheezing present for 2 days.  Also experiencing pain to the center of the back with deep breathing.  Has attempted use of Tylenol.  No known sick contacts.  Daily tobacco use.  Past Medical History:  Diagnosis Date   Bipolar affective disorder (HCC)    Coronary artery disease    GERD (gastroesophageal reflux disease)    Hyperlipidemia    Hypertension    Hypothyroid    Interstitial cystitis    Myocardial infarction (HCC) 2018   Stroke (HCC) 11/15/2016   "right side weaker since" (12/20/2016)   Stroke (HCC) 05/2022   No deficits   Wears dentures    full upper and lower   Wears hearing aid in right ear     Patient Active Problem List   Diagnosis Date Noted   Skin eruption 05/31/2023   Insect bite of right lower leg 05/31/2023   CVA (cerebral vascular accident) (HCC) 05/24/2022   Acute non-recurrent frontal sinusitis 01/24/2020   Seasonal allergic rhinitis due to pollen 01/24/2020   Acute upper respiratory infection 12/08/2019   Intractable vomiting 12/08/2019   S/P drug eluting coronary stent placement 04/13/2018   SVT (supraventricular tachycardia) (HCC) 04/10/2018   Hyperlipidemia, unspecified 12/01/2017   Choledocholithiasis with acute cholecystitis 12/19/2016   Coronary artery disease 12/19/2016   Essential hypertension 12/19/2016   Hypothyroidism 12/19/2016   History of stroke 12/19/2016   Migraine 06/17/2016   Cognitive deficit, post-stroke 05/18/2015   Chest pain 05/12/2015   Right facial numbness 03/30/2015   Lithium intoxication  02/27/2015   Bipolar disorder (HCC) 02/27/2015   Cerebral infarction (HCC) 02/26/2015   Unstable angina (HCC) 02/03/2015   Tobacco use 01/19/2013   Temporomandibular joint disorder 01/02/2013   Tarsal tunnel syndrome 07/21/2012   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   Plantar fascial fibromatosis 07/21/2012   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    Past Surgical History:  Procedure Laterality Date   ABDOMINAL HYSTERECTOMY     APPENDECTOMY     CARDIAC CATHETERIZATION Left 09/26/2015   Procedure: Left Heart Cath and Coronary Angiography;  Surgeon: Dalia Heading, MD;  Location: [facility];  Service: Cardiovascular;  Laterality: Left;   CATARACT EXTRACTION W/PHACO Right 09/21/2022   Procedure: CATARACT EXTRACTION PHACO AND INTRAOCULAR LENS PLACEMENT (IOC) RIGHT 6.83 00:48.0;  Surgeon: Estanislado Pandy, MD;  Location: [facility];  Service: Ophthalmology;  Laterality: Right;   CHOLECYSTECTOMY N/A 12/22/2016   Procedure: LAPAROSCOPIC CHOLECYSTECTOMY WITH INTRAOPERATIVE CHOLANGIOGRAM;  Surgeon: Griselda Miner, MD;  Location: [facility] OR;  Service: General;  Laterality: N/A;   CORONARY STENT INTERVENTION N/A 04/13/2018   Procedure: CORONARY STENT INTERVENTION;  Surgeon: Alwyn Pea, MD;  Location: [facility];  Service: Cardiovascular;  Laterality: N/A;   ERCP N/A 12/20/2016   Procedure: ENDOSCOPIC RETROGRADE CHOLANGIOPANCREATOGRAPHY (ERCP);  Surgeon: Dorena Cookey, MD;  Location: [facility] ENDOSCOPY;  Service: Endoscopy;  Laterality: N/A;   HYSTEROTOMY     INTERSTIM IMPLANT PLACEMENT  no longer functioning   LEFT HEART CATH AND CORONARY ANGIOGRAPHY Left 04/13/2018   Procedure: LEFT HEART CATH AND CORONARY ANGIOGRAPHY;  Surgeon: Dalia Heading, MD;  Location: [facility];  Service: Cardiovascular;  Laterality: Left;   LEFT HEART CATH AND CORONARY ANGIOGRAPHY Left 12/25/2019   Procedure: LEFT HEART CATH AND CORONARY  ANGIOGRAPHY;  Surgeon: Dalia Heading, MD;  Location: [facility];  Service: Cardiovascular;  Laterality: Left;    OB History   No obstetric history on file.      Home Medications    Prior to Admission medications   Medication Sig Start Date End Date Taking? Authorizing Provider  albuterol (VENTOLIN HFA) 108 (90 Base) MCG/ACT inhaler Inhale 2 puffs into the lungs every 4 (four) hours as needed for wheezing or shortness of breath. 10/20/23  Yes Cruise Baumgardner, Elita Boone, NP  amoxicillin (AMOXIL) 500 MG capsule Take 1 capsule (500 mg total) by mouth 2 (two) times daily for 7 days. 10/20/23 10/27/23 Yes Marsha Hillman R, NP  benzonatate (TESSALON) 100 MG capsule Take 1 capsule (100 mg total) by mouth every 8 (eight) hours. 10/20/23  Yes Cindel Daugherty R, NP  gabapentin (NEURONTIN) 300 MG capsule Take 300 mg by mouth at bedtime.   Yes [provider]  predniSONE (STERAPRED UNI-PAK 21 TAB) 10 MG (21) TBPK tablet Take by mouth daily. Take 6 tabs by mouth daily  for 1 days, then 5 tabs for 1 days, then 4 tabs for 1 days, then 3 tabs for 1 days, 2 tabs for 1 days, then 1 tab by mouth daily for 1 days 10/20/23  Yes Jeanine Caven R, NP  promethazine-dextromethorphan (PROMETHAZINE-DM) 6.25-15 MG/5ML syrup Take 2.5 mLs by mouth at bedtime as needed. 10/20/23  Yes Karelyn Brisby, Elita Boone, NP  acetaminophen (TYLENOL) 500 MG tablet Take 1,000 mg by mouth every 6 (six) hours as needed for mild pain or headache.    [provider]  atorvastatin (LIPITOR) 40 MG tablet Take 1 tablet (40 mg total) by mouth daily. Take 1 tab po daily 05/28/22   Sallyanne Kuster, NP  cetirizine (ZYRTEC) 10 MG tablet Take 1 tablet (10 mg total) by mouth daily. 05/28/22   Sallyanne Kuster, NP  citalopram (CELEXA) 40 MG tablet Take 1 tablet (40 mg total) by mouth daily. 05/28/22   Sallyanne Kuster, NP  clopidogrel (PLAVIX) 75 MG tablet Take 75 mg by mouth daily.     [provider]  diltiazem (CARDIZEM CD) 120 MG  24 hr capsule Take 1 capsule (120 mg total) by mouth daily. 10/19/21   Sallyanne Kuster, NP  diltiazem (CARDIZEM) 30 MG tablet Take 30 mg by mouth daily as needed (Heart palpitations).  10/25/19   [provider]  fluticasone (FLONASE) 50 MCG/ACT nasal spray Place 1 spray into both nostrils daily. 10/19/21   Sallyanne Kuster, NP  isosorbide mononitrate (IMDUR) 30 MG 24 hr tablet TAKE 1 TABLET BY MOUTH DAILY 11/09/22   Lyndon Code, MD  levothyroxine (SYNTHROID) 100 MCG tablet Take 1 tablet (100 mcg total) by mouth daily. 05/28/22   Sallyanne Kuster, NP  lisinopril (ZESTRIL) 10 MG tablet Take 1 tablet (10 mg total) by mouth daily. 02/28/23   Sallyanne Kuster, NP  metoprolol tartrate (LOPRESSOR) 25 MG tablet Take 12.5 mg by mouth daily.     [provider]  nitroGLYCERIN (NITROSTAT) 0.4 MG SL tablet Place 1 tablet (0.4 mg total) under the tongue every 5 (five) minutes as needed for chest pain. 10/19/21  Sallyanne Kuster, NP  pantoprazole (PROTONIX) 40 MG tablet TAKE 1 TABLET BY MOUTH TWICE  DAILY 11/09/22   Lyndon Code, MD    Family History Family History  Problem Relation Age of Onset   CAD Other     Social History Social History   Tobacco Use   Smoking status: Every Day    Current packs/day: 0.50    Average packs/day: 0.5 packs/day for 51.0 years (25.5 ttl pk-yrs)    Types: Cigarettes   Smokeless tobacco: Never   Tobacco comments:    Started smoking age 35.  was up to two packs a day  Vaping Use   Vaping status: Never Used  Substance Use Topics   Alcohol use: Not Currently    Comment: occasionally    Drug use: Yes    Comment: CBD gummies     Allergies   Morphine, Sulfa antibiotics, Sulfamethoxazole-trimethoprim, Azithromycin, Pregabalin, Asa [aspirin], and Rofecoxib   Review of Systems Review of Systems   Physical Exam Triage Vital Signs ED Triage Vitals  Encounter Vitals Group     BP 10/20/23 1426 (!) 161/79     Systolic BP Percentile --       Diastolic BP Percentile --      Pulse Rate 10/20/23 1426 (!) 55     Resp 10/20/23 1426 18     Temp 10/20/23 1426 97.7 F (36.5 C)     Temp Source 10/20/23 1426 Temporal     SpO2 10/20/23 1426 96 %     Weight --      Height --      Head Circumference --      Peak Flow --      Pain Score 10/20/23 1424 5     Pain Loc --      Pain Education --      Exclude from Growth Chart --    No data found.  Updated Vital Signs BP (!) 161/79 (BP Location: Left Arm)   Pulse (!) 55   Temp 97.7 F (36.5 C) (Temporal)   Resp 18   SpO2 96%   Visual Acuity Right Eye Distance:   Left Eye Distance:   Bilateral Distance:    Right Eye Near:   Left Eye Near:    Bilateral Near:     Physical Exam Constitutional:      Appearance: She is ill-appearing.  HENT:     Head: Normocephalic.     Right Ear: Tympanic membrane, ear canal and external ear normal.     Left Ear: Tympanic membrane, ear canal and external ear normal.     Nose: Congestion present.     Mouth/Throat:     Pharynx: No oropharyngeal exudate or posterior oropharyngeal erythema.  Eyes:     Extraocular Movements: Extraocular movements intact.  Cardiovascular:     Rate and Rhythm: Normal rate and regular rhythm.     Pulses: Normal pulses.     Heart sounds: Normal heart sounds.  Pulmonary:     Effort: Pulmonary effort is normal.     Breath sounds: Normal breath sounds.  Neurological:     Mental Status: She is alert and oriented to person, place, and time. Mental status is at baseline.      UC Treatments / Results  Labs (all labs ordered are listed, but only abnormal results are displayed) Labs Reviewed  POC COVID19/FLU A&B COMBO - Normal    EKG   Radiology No results found.  Procedures Procedures (including critical care time)  Medications Ordered in UC Medications - No data to display  Initial Impression / Assessment and Plan / UC Course  I have reviewed the triage vital signs and the nursing  notes.  Pertinent labs & imaging results that were available during my care of the patient were reviewed by me and considered in my medical decision making (see chart for details).  Viral URI with cough  Patient is in no signs of distress nor toxic appearing.  Vital signs are stable.  Low suspicion for pneumonia, pneumothorax or bronchitis and therefore will defer imaging.  COVID and flu test negative.  Prophylactically placed on antibiotics, prescribed amoxicillin.  Prescribed prednisone, albuterol inhaler, Tessalon and Promethazine DM for management of shortness of breath wheezing and cough. May use additional over-the-counter medications as needed for supportive care.  May follow-up with urgent care as needed if symptoms persist or worsen.  Note given.   Final Clinical Impressions(s) / UC Diagnoses   Final diagnoses:  Viral URI with cough     Discharge Instructions      Your symptoms today are most likely being caused by a virus and should steadily improve in time it can take up to 7 to 10 days before you truly start to see a turnaround however things will get better  You have been prophylactically placed on antibiotic to keep symptoms from worsening, take amoxicillin twice a daily for 7 days  Begin prednisone every morning with food as directed to help reduce internal inflammation which help with breathing and pain  You may use inhaler taking 2 puffs every 4 hours as needed for shortness of breath and wheezing  May use Tessalon pill every 8 hours as needed, may use cough syrup at bedtime   For cough: honey 1/2 to 1 teaspoon (you can dilute the honey in water or another fluid).  You can also use guaifenesin and dextromethorphan for cough. You can use a humidifier for chest congestion and cough.  If you don't have a humidifier, you can sit in the bathroom with the hot shower running.      For sore throat: try warm salt water gargles, cepacol lozenges, throat spray, warm tea or water  with lemon/honey, popsicles or ice, or OTC cold relief medicine for throat discomfort.   For congestion: take a daily anti-histamine like Zyrtec, Claritin, and a oral decongestant, such as pseudoephedrine.  You can also use Flonase 1-2 sprays in each nostril daily.   It is important to stay hydrated: drink plenty of fluids (water, gatorade/powerade/pedialyte, juices, or teas) to keep your throat moisturized and help further relieve irritation/discomfort.    ED Prescriptions     Medication Sig Dispense Auth. Provider   amoxicillin (AMOXIL) 500 MG capsule Take 1 capsule (500 mg total) by mouth 2 (two) times daily for 7 days. 14 capsule Dorleen Kissel R, NP   predniSONE (STERAPRED UNI-PAK 21 TAB) 10 MG (21) TBPK tablet Take by mouth daily. Take 6 tabs by mouth daily  for 1 days, then 5 tabs for 1 days, then 4 tabs for 1 days, then 3 tabs for 1 days, 2 tabs for 1 days, then 1 tab by mouth daily for 1 days 21 tablet Jaiquan Temme R, NP   benzonatate (TESSALON) 100 MG capsule Take 1 capsule (100 mg total) by mouth every 8 (eight) hours. 21 capsule Sheron Robin R, NP   promethazine-dextromethorphan (PROMETHAZINE-DM) 6.25-15 MG/5ML syrup Take 2.5 mLs by mouth at bedtime as needed. 118 mL Valinda Hoar, NP  albuterol (VENTOLIN HFA) 108 (90 Base) MCG/ACT inhaler Inhale 2 puffs into the lungs every 4 (four) hours as needed for wheezing or shortness of breath. 8.5 g Valinda Hoar, NP      PDMP not reviewed this encounter.   Valinda Hoar, NP 10/20/23 [phone]

## 2023-10-20 NOTE — Discharge Instructions (Signed)
 Your symptoms today are most likely being caused by a virus and should steadily improve in time it can take up to 7 to 10 days before you truly start to see a turnaround however things will get better  You have been prophylactically placed on antibiotic to keep symptoms from worsening, take amoxicillin twice a daily for 7 days  Begin prednisone every morning with food as directed to help reduce internal inflammation which help with breathing and pain  You may use inhaler taking 2 puffs every 4 hours as needed for shortness of breath and wheezing  May use Tessalon pill every 8 hours as needed, may use cough syrup at bedtime   For cough: honey 1/2 to 1 teaspoon (you can dilute the honey in water or another fluid).  You can also use guaifenesin and dextromethorphan for cough. You can use a humidifier for chest congestion and cough.  If you don't have a humidifier, you can sit in the bathroom with the hot shower running.      For sore throat: try warm salt water gargles, cepacol lozenges, throat spray, warm tea or water with lemon/honey, popsicles or ice, or OTC cold relief medicine for throat discomfort.   For congestion: take a daily anti-histamine like Zyrtec, Claritin, and a oral decongestant, such as pseudoephedrine.  You can also use Flonase 1-2 sprays in each nostril daily.   It is important to stay hydrated: drink plenty of fluids (water, gatorade/powerade/pedialyte, juices, or teas) to keep your throat moisturized and help further relieve irritation/discomfort.

## 2023-10-20 NOTE — ED Triage Notes (Addendum)
 Since Tuesday patient has had cough, sore throat, chills, hot, low grade fever, back pain and brown sputum.  Patient is mostly concerned for the bilateral back pain with inspiration and coughing.  States she is a smoker and it hurts to take a breath

## 2023-12-06 DIAGNOSIS — R202 Paresthesia of skin: Secondary | ICD-10-CM | POA: Diagnosis not present

## 2023-12-06 DIAGNOSIS — R2689 Other abnormalities of gait and mobility: Secondary | ICD-10-CM | POA: Diagnosis not present

## 2023-12-06 DIAGNOSIS — R4789 Other speech disturbances: Secondary | ICD-10-CM | POA: Diagnosis not present

## 2023-12-06 DIAGNOSIS — R200 Anesthesia of skin: Secondary | ICD-10-CM | POA: Diagnosis not present

## 2023-12-06 DIAGNOSIS — Z8673 Personal history of transient ischemic attack (TIA), and cerebral infarction without residual deficits: Secondary | ICD-10-CM | POA: Diagnosis not present

## 2023-12-06 DIAGNOSIS — R7989 Other specified abnormal findings of blood chemistry: Secondary | ICD-10-CM | POA: Diagnosis not present

## 2023-12-06 DIAGNOSIS — R531 Weakness: Secondary | ICD-10-CM | POA: Diagnosis not present

## 2023-12-06 DIAGNOSIS — E559 Vitamin D deficiency, unspecified: Secondary | ICD-10-CM | POA: Diagnosis not present

## 2023-12-06 DIAGNOSIS — R296 Repeated falls: Secondary | ICD-10-CM | POA: Diagnosis not present

## 2023-12-06 DIAGNOSIS — R1310 Dysphagia, unspecified: Secondary | ICD-10-CM | POA: Diagnosis not present

## 2023-12-06 DIAGNOSIS — E538 Deficiency of other specified B group vitamins: Secondary | ICD-10-CM | POA: Diagnosis not present

## 2023-12-06 DIAGNOSIS — M2550 Pain in unspecified joint: Secondary | ICD-10-CM | POA: Diagnosis not present

## 2023-12-06 DIAGNOSIS — H539 Unspecified visual disturbance: Secondary | ICD-10-CM | POA: Diagnosis not present

## 2023-12-07 ENCOUNTER — Other Ambulatory Visit: Payer: Self-pay | Admitting: Physician Assistant

## 2023-12-07 DIAGNOSIS — R1310 Dysphagia, unspecified: Secondary | ICD-10-CM

## 2023-12-07 DIAGNOSIS — H539 Unspecified visual disturbance: Secondary | ICD-10-CM

## 2023-12-07 DIAGNOSIS — R4789 Other speech disturbances: Secondary | ICD-10-CM

## 2023-12-07 DIAGNOSIS — R4189 Other symptoms and signs involving cognitive functions and awareness: Secondary | ICD-10-CM

## 2023-12-12 ENCOUNTER — Ambulatory Visit: Admission: RE | Admit: 2023-12-12 | Source: Ambulatory Visit

## 2023-12-14 ENCOUNTER — Ambulatory Visit
Admission: RE | Admit: 2023-12-14 | Discharge: 2023-12-14 | Disposition: A | Discharge status: Home / Self Care | Source: Ambulatory Visit | Attending: Physician Assistant | Admitting: Physician Assistant

## 2023-12-14 DIAGNOSIS — R4789 Other speech disturbances: Secondary | ICD-10-CM | POA: Insufficient documentation

## 2023-12-14 DIAGNOSIS — R4189 Other symptoms and signs involving cognitive functions and awareness: Secondary | ICD-10-CM | POA: Insufficient documentation

## 2023-12-14 DIAGNOSIS — H539 Unspecified visual disturbance: Secondary | ICD-10-CM | POA: Insufficient documentation

## 2023-12-14 DIAGNOSIS — R1310 Dysphagia, unspecified: Secondary | ICD-10-CM | POA: Diagnosis not present

## 2023-12-14 DIAGNOSIS — G9389 Other specified disorders of brain: Secondary | ICD-10-CM | POA: Diagnosis not present

## 2024-01-03 DIAGNOSIS — R200 Anesthesia of skin: Secondary | ICD-10-CM | POA: Diagnosis not present

## 2024-02-18 ENCOUNTER — Emergency Department
Admission: EM | Admit: 2024-02-18 | Discharge: 2024-02-18 | Disposition: A | Discharge status: Home / Self Care | Attending: Emergency Medicine | Admitting: Emergency Medicine

## 2024-02-18 ENCOUNTER — Emergency Department

## 2024-02-18 ENCOUNTER — Other Ambulatory Visit: Payer: Self-pay

## 2024-02-18 DIAGNOSIS — I10 Essential (primary) hypertension: Secondary | ICD-10-CM | POA: Diagnosis not present

## 2024-02-18 DIAGNOSIS — R0789 Other chest pain: Secondary | ICD-10-CM | POA: Diagnosis present

## 2024-02-18 DIAGNOSIS — R079 Chest pain, unspecified: Secondary | ICD-10-CM

## 2024-02-18 LAB — CBC
HCT: 33.5 % — ABNORMAL LOW (ref 36.0–46.0)
Hemoglobin: 10.9 g/dL — ABNORMAL LOW (ref 12.0–15.0)
MCH: 30.8 pg (ref 26.0–34.0)
MCHC: 32.5 g/dL (ref 30.0–36.0)
MCV: 94.6 fL (ref 80.0–100.0)
Platelets: 170 K/uL (ref 150–400)
RBC: 3.54 MIL/uL — ABNORMAL LOW (ref 3.87–5.11)
RDW: 12.5 % (ref 11.5–15.5)
WBC: 8.2 K/uL (ref 4.0–10.5)
nRBC: 0 % (ref 0.0–0.2)

## 2024-02-18 LAB — COMPREHENSIVE METABOLIC PANEL WITH GFR
ALT: 13 U/L (ref 0–44)
AST: 19 U/L (ref 15–41)
Albumin: 3.8 g/dL (ref 3.5–5.0)
Alkaline Phosphatase: 64 U/L (ref 38–126)
Anion gap: 8 (ref 5–15)
BUN: 10 mg/dL (ref 8–23)
CO2: 25 mmol/L (ref 22–32)
Calcium: 9.1 mg/dL (ref 8.9–10.3)
Chloride: 106 mmol/L (ref 98–111)
Creatinine, Ser: 0.67 mg/dL (ref 0.44–1.00)
GFR, Estimated: >60 mL/min (ref >60)
Glucose, Bld: 77 mg/dL (ref 70–99)
Potassium: 3.4 mmol/L — ABNORMAL LOW (ref 3.5–5.1)
Sodium: 139 mmol/L (ref 135–145)
Total Bilirubin: 0.6 mg/dL (ref 0.0–1.2)
Total Protein: 6.4 g/dL — ABNORMAL LOW (ref 6.5–8.1)

## 2024-02-18 LAB — TROPONIN I (HIGH SENSITIVITY)
Troponin I (High Sensitivity): 4 ng/L (ref <18)
Troponin I (High Sensitivity): 4 ng/L (ref <18)

## 2024-02-18 LAB — LIPASE, BLOOD: Lipase: 35 U/L (ref 11–51)

## 2024-02-18 MED ORDER — ALUM & MAG HYDROXIDE-SIMETH 200-200-20 MG/5ML PO SUSP
30.0000 mL | Freq: Once | ORAL | Status: AC
  Administered 2024-02-18: 30 mL via ORAL
  Filled 2024-02-18: qty 30

## 2024-02-18 MED ORDER — LIDOCAINE VISCOUS HCL 2 % MT SOLN
15.0000 mL | Freq: Once | OROMUCOSAL | Status: AC
  Administered 2024-02-18: 15 mL via ORAL
  Filled 2024-02-18: qty 15

## 2024-02-18 NOTE — ED Provider Notes (Signed)
 [facility] Provider Note    Event Date/Time   First MD Initiated Contact with Patient 02/18/24 1733     (approximate)  History   Chief Complaint: Chest Pain  HPI  Carol [name] is a 68 y.o. female with a past Eckel history of bipolar, gastric reflux, hypertension, hyperlipidemia, prior CVA, prior MI with stent, presents to the emergency department for chest pain.  According to the patient since this morning she has been experiencing a discomfort in the center of her chest.  Patient thought it was indigestion has tried her indigestion medications without relief.  Patient came to the emergency department for evaluation.  Does admit she has been under a lot of stress recently.  Denies any shortness of breath or nausea but states she felt like she was getting somewhat sweaty earlier today.  Physical Exam   Triage Vital Signs: ED Triage Vitals  Encounter Vitals Group     BP 02/18/24 1727 (!) 141/57     Girls Systolic BP Percentile --      Girls Diastolic BP Percentile --      Boys Systolic BP Percentile --      Boys Diastolic BP Percentile --      Pulse Rate 02/18/24 1727 (!) 47     Resp 02/18/24 1727 14     Temp 02/18/24 1727 97.9 F (36.6 C)     Temp Source 02/18/24 1727 Oral     SpO2 02/18/24 1727 100 %     Weight 02/18/24 1730 140 lb (63.5 kg)     Height 02/18/24 1730 5' (1.524 m)     Head Circumference --      Peak Flow --      Pain Score 02/18/24 1728 8     Pain Loc --      Pain Education --      Exclude from Growth Chart --     Most recent vital signs: Vitals:   02/18/24 1727  BP: (!) 141/57  Pulse: (!) 47  Resp: 14  Temp: 97.9 F (36.6 C)  SpO2: 100%    General: Awake, no distress.  CV:  Good peripheral perfusion.  Regular rate and rhythm  Resp:  Normal effort.  Equal breath sounds bilaterally.  Abd:  No distention.  Soft, nontender.  No rebound or guarding.  ED Results / Procedures / Treatments   EKG  EKG viewed and  interpreted by myself shows sinus bradycardia 49 bpm with a narrow QRS, normal axis, normal intervals, no concerning ST changes.  RADIOLOGY  I have reviewed interpret the chest x-ray images.  No consolidation on my evaluation. Radiology is read the x-ray is negative   MEDICATIONS ORDERED IN ED: Medications  alum & mag hydroxide-simeth (MAALOX/MYLANTA) 200-200-20 MG/5ML suspension 30 mL (has no administration in time range)    And  lidocaine  (XYLOCAINE ) 2 % viscous mouth solution 15 mL (has no administration in time range)     IMPRESSION / MDM / ASSESSMENT AND PLAN / ED COURSE  I reviewed the triage vital signs and the nursing notes.  Patient's presentation is most consistent with acute presentation with potential threat to life or bodily function.  Patient presents to the emergency department for chest pain.  Patient states it feels like indigestion but did not relieve after taking medications.  EKG shows no concerning findings.  Will check labs including a CBC chemistry troponin and lipase.  We will dose a GI cocktail obtain a chest x-ray and continue  to closely monitor.  Patient agreeable to plan of care and workup.  Patient's workup is reassuring with a normal CBC, reassuring chemistry.  Normal lipase.  Negative troponin x 2.  Reassuring chest x-ray and reassuring EKG.  Patient states good pain relief after taking the GI cocktail medication.  Given the patient's reassuring workup, reassuring exam and relief of chest discomfort after GI cocktail believe the patient safe for discharge home.  Discussed my normal chest pain return precautions.  Patient agreeable  FINAL CLINICAL IMPRESSION(S) / ED DIAGNOSES   Chest pain   Note:  This document was prepared using Dragon voice recognition software and may include unintentional dictation errors.   Dorothyann Drivers, MD 02/18/24 2134

## 2024-02-18 NOTE — ED Triage Notes (Signed)
 Pt to ED via [facility] from work for c/o right-sided chest pain that began a couple hours ago. Pt allergic to aspirin , given one nitroglycerin  by EMS. Pt has hx MI, stroke.

## 2024-04-11 ENCOUNTER — Inpatient Hospital Stay: Attending: Oncology | Admitting: Oncology

## 2024-04-11 ENCOUNTER — Inpatient Hospital Stay

## 2024-04-11 ENCOUNTER — Encounter: Payer: Self-pay | Admitting: Oncology

## 2024-04-11 ENCOUNTER — Other Ambulatory Visit: Payer: Self-pay

## 2024-04-11 VITALS — BP 122/82 | HR 58 | Temp 98.0°F | Resp 18 | Ht 60.0 in | Wt 146.0 lb

## 2024-04-11 DIAGNOSIS — D509 Iron deficiency anemia, unspecified: Secondary | ICD-10-CM | POA: Diagnosis present

## 2024-04-11 DIAGNOSIS — I2510 Atherosclerotic heart disease of native coronary artery without angina pectoris: Secondary | ICD-10-CM | POA: Insufficient documentation

## 2024-04-11 DIAGNOSIS — Z8673 Personal history of transient ischemic attack (TIA), and cerebral infarction without residual deficits: Secondary | ICD-10-CM | POA: Insufficient documentation

## 2024-04-11 DIAGNOSIS — Z7902 Long term (current) use of antithrombotics/antiplatelets: Secondary | ICD-10-CM | POA: Insufficient documentation

## 2024-04-11 DIAGNOSIS — N3010 Interstitial cystitis (chronic) without hematuria: Secondary | ICD-10-CM | POA: Insufficient documentation

## 2024-04-11 DIAGNOSIS — I252 Old myocardial infarction: Secondary | ICD-10-CM | POA: Insufficient documentation

## 2024-04-11 DIAGNOSIS — Z79899 Other long term (current) drug therapy: Secondary | ICD-10-CM | POA: Insufficient documentation

## 2024-04-11 DIAGNOSIS — F319 Bipolar disorder, unspecified: Secondary | ICD-10-CM | POA: Insufficient documentation

## 2024-04-11 DIAGNOSIS — F17210 Nicotine dependence, cigarettes, uncomplicated: Secondary | ICD-10-CM | POA: Diagnosis not present

## 2024-04-11 DIAGNOSIS — E785 Hyperlipidemia, unspecified: Secondary | ICD-10-CM | POA: Insufficient documentation

## 2024-04-11 DIAGNOSIS — E039 Hypothyroidism, unspecified: Secondary | ICD-10-CM | POA: Diagnosis not present

## 2024-04-11 DIAGNOSIS — I10 Essential (primary) hypertension: Secondary | ICD-10-CM | POA: Insufficient documentation

## 2024-04-11 DIAGNOSIS — Z79890 Hormone replacement therapy (postmenopausal): Secondary | ICD-10-CM | POA: Diagnosis not present

## 2024-04-11 DIAGNOSIS — K219 Gastro-esophageal reflux disease without esophagitis: Secondary | ICD-10-CM | POA: Diagnosis not present

## 2024-04-11 DIAGNOSIS — R5383 Other fatigue: Secondary | ICD-10-CM | POA: Insufficient documentation

## 2024-04-11 LAB — CBC (CANCER CENTER ONLY)
HCT: 34.2 % — ABNORMAL LOW (ref 36.0–46.0)
Hemoglobin: 10.9 g/dL — ABNORMAL LOW (ref 12.0–15.0)
MCH: 31.1 pg (ref 26.0–34.0)
MCHC: 31.9 g/dL (ref 30.0–36.0)
MCV: 97.4 fL (ref 80.0–100.0)
Platelet Count: 193 K/uL (ref 150–400)
RBC: 3.51 MIL/uL — ABNORMAL LOW (ref 3.87–5.11)
RDW: 13.4 % (ref 11.5–15.5)
WBC Count: 7 K/uL (ref 4.0–10.5)
nRBC: 0 % (ref 0.0–0.2)

## 2024-04-11 LAB — VITAMIN B12: Vitamin B-12: 532 pg/mL (ref 180–914)

## 2024-04-11 LAB — IRON AND TIBC
Iron: 54 ug/dL (ref 28–170)
Saturation Ratios: 11 % (ref 10.4–31.8)
TIBC: 482 ug/dL — ABNORMAL HIGH (ref 250–450)
UIBC: 428 ug/dL

## 2024-04-11 LAB — FERRITIN: Ferritin: 8 ng/mL — ABNORMAL LOW (ref 11–307)

## 2024-04-11 LAB — DIRECT ANTIGLOBULIN TEST (NOT AT ARMC)
DAT, IgG: NEGATIVE
DAT, complement: NEGATIVE

## 2024-04-11 LAB — LACTATE DEHYDROGENASE: LDH: 146 U/L (ref 98–192)

## 2024-04-11 LAB — FOLATE: Folate: 15.6 ng/mL (ref >5.9)

## 2024-04-11 NOTE — Progress Notes (Unsigned)
 Has never done the iron  infusions. She can't tolerate the iron  pills.

## 2024-04-12 ENCOUNTER — Other Ambulatory Visit: Payer: Self-pay | Admitting: Oncology

## 2024-04-12 DIAGNOSIS — D509 Iron deficiency anemia, unspecified: Secondary | ICD-10-CM | POA: Insufficient documentation

## 2024-04-12 LAB — PROTEIN ELECTROPHORESIS, SERUM
A/G Ratio: 1.3 (ref 0.7–1.7)
Albumin ELP: 3.9 g/dL (ref 2.9–4.4)
Alpha-1-Globulin: 0.3 g/dL (ref 0.0–0.4)
Alpha-2-Globulin: 0.7 g/dL (ref 0.4–1.0)
Beta Globulin: 1 g/dL (ref 0.7–1.3)
Gamma Globulin: 1 g/dL (ref 0.4–1.8)
Globulin, Total: 3 g/dL (ref 2.2–3.9)
Total Protein ELP: 6.9 g/dL (ref 6.0–8.5)

## 2024-04-12 LAB — HAPTOGLOBIN: Haptoglobin: 135 mg/dL (ref 37–355)

## 2024-04-12 NOTE — Progress Notes (Signed)
 Carol [name], Carol [name] is a 68 year old female who was noted to have decreased hemoglobin and iron  stores on routine blood work.  She is intolerant to oral iron  supplementation.  She has chronic fatigue, but otherwise feels well.  She has no neurologic complaints.  She denies any recent fevers or illnesses.  She has a good appetite and denies weight loss.  She has no chest pain, shortness of breath, cough, or hemoptysis.  She denies any nausea, vomiting, constipation, or diarrhea.  She has no melena or hematochezia.  She has no urinary complaints.  [name] offers no further specific complaints today.  REVIEW OF SYSTEMS:   Review of Systems  Constitutional:  Positive for malaise/fatigue. Negative for fever and weight loss.  Respiratory: Negative.  Negative for cough, hemoptysis and shortness of breath.   Cardiovascular: Negative.  Negative for chest pain and leg swelling.  Gastrointestinal: Negative.  Negative for abdominal pain, blood in stool and melena.  Genitourinary: Negative.  Negative for dysuria.  Musculoskeletal:  Negative for back pain.  Skin: Negative.  Negative for rash.  Neurological: Negative.  Negative for dizziness, focal weakness, weakness and headaches.  Psychiatric/Behavioral: Negative.  The [name] is not nervous/anxious.     As per HPI. Otherwise, a complete review of systems is negative.  PAST MEDICAL HISTORY: Past Medical History:  Diagnosis Date   Bipolar affective disorder (HCC)    Coronary artery disease    GERD (gastroesophageal reflux disease)    Hyperlipidemia    Hypertension    Hypothyroid    Interstitial cystitis    Myocardial infarction (HCC) 2018   Stroke (HCC) 11/15/2016    right side weaker since (12/20/2016)   Stroke (HCC) 05/2022   No deficits   Wears dentures    full upper and lower   Wears hearing aid in right ear     PAST SURGICAL HISTORY: Past Surgical History:  Procedure Laterality Date   ABDOMINAL HYSTERECTOMY     APPENDECTOMY     CARDIAC CATHETERIZATION Left 09/26/2015   Procedure: Left Heart Cath and Coronary Angiography;  Surgeon: Vinie DELENA Jude, MD;  Location: [facility];  Service: Cardiovascular;  Laterality: Left;   CATARACT EXTRACTION W/PHACO Right 09/21/2022   Procedure: CATARACT EXTRACTION PHACO AND INTRAOCULAR LENS PLACEMENT (IOC) RIGHT 6.83 00:48.0;  Surgeon: Enola Feliciano Hugger, MD;  Location: [name];  Service: Ophthalmology;  Laterality: Right;   CHOLECYSTECTOMY N/A 12/22/2016   Procedure: LAPAROSCOPIC CHOLECYSTECTOMY WITH INTRAOPERATIVE CHOLANGIOGRAM;  Surgeon: Curvin Deward MOULD, MD;  Location: [name];  Service: General;  Laterality: N/A;   CORONARY STENT INTERVENTION N/A 04/13/2018   Procedure: CORONARY STENT INTERVENTION;  Surgeon: Florencio Cara BIRCH, MD;  Location: [facility];  Service: Cardiovascular;  Laterality: N/A;   ERCP N/A 12/20/2016   Procedure: ENDOSCOPIC RETROGRADE CHOLANGIOPANCREATOGRAPHY (ERCP);  Surgeon: Dyane Rush, MD;  Location: [facility];  Service: Endoscopy;  Laterality: N/A;   HYSTEROTOMY     INTERSTIM IMPLANT PLACEMENT     no longer functioning   LEFT HEART CATH AND CORONARY ANGIOGRAPHY Left 04/13/2018   Procedure: LEFT HEART CATH AND CORONARY ANGIOGRAPHY;  Surgeon: Jude Vinie DELENA, MD;  Location: [facility];  Service: Cardiovascular;  Laterality: Left;   LEFT HEART CATH AND CORONARY ANGIOGRAPHY  Left 12/25/2019   Procedure: LEFT HEART CATH AND CORONARY ANGIOGRAPHY;  Surgeon: Bosie Vinie LABOR, MD;  Location: [facility];  Service: Cardiovascular;  Laterality: Left;    FAMILY HISTORY: Family History  Problem Relation Age of Onset   Multiple sclerosis  Maternal Aunt    Diabetes Paternal Grandmother    CAD Maternal Aunt     ADVANCED DIRECTIVES (Y/N):  N  HEALTH MAINTENANCE: Social History   Tobacco Use   Smoking status: Every Day    Current packs/day: 0.50    Average packs/day: 0.5 packs/day for 51.0 years (25.5 ttl pk-yrs)    Types: Cigarettes   Smokeless tobacco: Never   Tobacco comments:    Started smoking age 77.  was up to two packs a day  Vaping Use   Vaping status: Never Used  Substance Use Topics   Alcohol use: Not Currently    Comment: occasionally    Drug use: Yes    Comment: CBD gummies     Colonoscopy:  PAP:  Bone density:  Lipid panel:  Allergies  Allergen Reactions   Morphine  Other (See Comments)    Hallucinations    Sulfa Antibiotics Hives   Sulfamethoxazole-Trimethoprim Swelling   Azithromycin  Diarrhea and Nausea And Vomiting    Burn stomach   Pregabalin Other (See Comments)    Pt states that she was told not to take this medication.     Dorethia Meyer ] Hives, Nausea Only and Other (See Comments)    Burning of stomach    Rofecoxib Other (See Comments)    Burning of stomach     Current Outpatient Medications  Medication Sig Dispense Refill   acetaminophen  (TYLENOL ) 500 MG tablet Take 1,000 mg by mouth every 6 (six) hours as needed for mild pain or headache.     atorvastatin  (LIPITOR) 40 MG tablet Take 1 tablet (40 mg total) by mouth daily. Take 1 tab po daily 90 tablet 3   cetirizine  (ZYRTEC ) 10 MG tablet Take 1 tablet (10 mg total) by mouth daily. 90 tablet 1   citalopram  (CELEXA ) 40 MG tablet Take 1 tablet (40 mg total) by mouth daily. 90 tablet 3   clopidogrel  (PLAVIX ) 75 MG tablet Take 75 mg by mouth daily.      diltiazem  (CARDIZEM  CD) 120 MG 24 hr capsule Take 1 capsule (120 mg total) by mouth daily. 90 capsule 3   diltiazem  (CARDIZEM ) 30 MG tablet Take 30 mg by mouth daily as needed (Heart palpitations).      gabapentin (NEURONTIN) 300 MG capsule Take 300 mg by mouth at bedtime.      isosorbide  mononitrate (IMDUR ) 30 MG 24 hr tablet TAKE 1 TABLET BY MOUTH DAILY 100 tablet 2   levothyroxine  (SYNTHROID ) 100 MCG tablet Take 1 tablet (100 mcg total) by mouth daily. 90 tablet 3   lisinopril  (ZESTRIL ) 10 MG tablet Take 1 tablet (10 mg total) by mouth daily. 30 tablet 0   metoprolol  tartrate (LOPRESSOR ) 25 MG tablet Take 12.5 mg by mouth daily.      nitroGLYCERIN  (NITROSTAT ) 0.4 MG SL tablet Place 1 tablet (0.4 mg total) under the tongue every 5 (five) minutes as needed for chest pain. 30 tablet 2   pantoprazole  (PROTONIX ) 40 MG tablet TAKE 1 TABLET BY MOUTH TWICE  DAILY 200 tablet 2   No current facility-administered medications for this visit.    OBJECTIVE: Vitals:   04/11/24 1259  BP: 122/82  Pulse: (!) 58  Resp: 18  Temp: 98 F (36.7  C)  SpO2: 98%     Body mass index is 28.51 kg/m.    ECOG FS:0 - Asymptomatic  General: Well-developed, well-nourished, no acute distress. Eyes: Pink conjunctiva, anicteric sclera. HEENT: Normocephalic, moist mucous membranes. Lungs: No audible wheezing or coughing. Heart: Regular rate and rhythm. Abdomen: Soft, nontender, no obvious distention. Musculoskeletal: No edema, cyanosis, or clubbing. Neuro: Alert, answering all questions appropriately. Cranial nerves grossly intact. Skin: No rashes or petechiae noted. Psych: Normal affect. Lymphatics: No cervical, calvicular, axillary or inguinal LAD.   LAB RESULTS:  Lab Results  Component Value Date   NA 139 02/18/2024   K 3.4 (L) 02/18/2024   CL 106 02/18/2024   CO2 25 02/18/2024   GLUCOSE 77 02/18/2024   BUN 10 02/18/2024   CREATININE 0.67 02/18/2024   CALCIUM  9.1 02/18/2024   PROT 6.4 (L) 02/18/2024   ALBUMIN 3.8 02/18/2024   AST 19 02/18/2024   ALT 13 02/18/2024   ALKPHOS 64 02/18/2024   BILITOT 0.6 02/18/2024   GFRNONAA >60 02/18/2024   GFRAA >60 11/25/2019    Lab Results  Component Value Date   WBC 7.0 04/11/2024   NEUTROABS 3.7 05/24/2022   HGB 10.9 (L)  04/11/2024   HCT 34.2 (L) 04/11/2024   MCV 97.4 04/11/2024   PLT 193 04/11/2024   Lab Results  Component Value Date   IRON  54 04/11/2024   TIBC 482 (H) 04/11/2024   IRONPCTSAT 11 04/11/2024   Lab Results  Component Value Date   FERRITIN 8 (L) 04/11/2024     STUDIES: No results found.  ASSESSMENT: Iron  deficiency anemia.  PLAN:    Iron  deficiency anemia: [name]'s hemoglobin is decreased at 10.9 with reduced iron  stores.  She has no evidence of hemolysis.  B12 and folate levels are within normal limits.  The remainder of her laboratory work from today is pending at time of dictation.  It is unclear when [name]'s last luminal evaluation occurred.  Consider referral to GI for colonoscopy.  [name] will return to clinic 5 times over the next 1 to 2 weeks for 200 mg IV Venofer .  She would then return to clinic in 4 months for repeat laboratory work, further evaluation, and continuation of treatment if needed.    I spent a total of 45 minutes reviewing chart data, face-to-face evaluation with the [name], counseling and coordination of care as detailed above.  [name] expressed understanding and was in agreement with this plan. She also understands that She can call clinic at any time with any questions, concerns, or complaints.     Evalene JINNY Reusing, MD   04/12/2024 9:10 AM

## 2024-04-13 ENCOUNTER — Inpatient Hospital Stay

## 2024-04-13 VITALS — BP 121/50 | HR 56 | Temp 99.3°F | Resp 18

## 2024-04-13 DIAGNOSIS — D509 Iron deficiency anemia, unspecified: Secondary | ICD-10-CM | POA: Diagnosis not present

## 2024-04-13 MED ORDER — IRON SUCROSE 20 MG/ML IV SOLN
200.0000 mg | Freq: Once | INTRAVENOUS | Status: AC
  Administered 2024-04-13: 200 mg via INTRAVENOUS

## 2024-04-13 NOTE — Patient Instructions (Signed)

## 2024-04-19 ENCOUNTER — Inpatient Hospital Stay: Attending: Oncology

## 2024-04-19 VITALS — BP 119/51 | HR 54 | Temp 98.5°F | Resp 18

## 2024-04-19 DIAGNOSIS — Z79899 Other long term (current) drug therapy: Secondary | ICD-10-CM | POA: Insufficient documentation

## 2024-04-19 DIAGNOSIS — Z23 Encounter for immunization: Secondary | ICD-10-CM | POA: Diagnosis not present

## 2024-04-19 DIAGNOSIS — D509 Iron deficiency anemia, unspecified: Secondary | ICD-10-CM | POA: Diagnosis present

## 2024-04-19 MED ORDER — INFLUENZA VAC A&B SURF ANT ADJ 0.5 ML IM SUSY: 0.5000 mL | PREFILLED_SYRINGE | Freq: Once | INTRAMUSCULAR | Status: DC

## 2024-04-19 MED ORDER — IRON SUCROSE 20 MG/ML IV SOLN
200.0000 mg | Freq: Once | INTRAVENOUS | Status: AC
  Administered 2024-04-19: 200 mg via INTRAVENOUS
  Filled 2024-04-19: qty 10

## 2024-04-19 MED ORDER — INFLUENZA VAC SPLIT HIGH-DOSE 0.5 ML IM SUSY
0.5000 mL | PREFILLED_SYRINGE | Freq: Once | INTRAMUSCULAR | Status: AC
  Administered 2024-04-19: 0.5 mL via INTRAMUSCULAR
  Filled 2024-04-19: qty 0.5

## 2024-04-19 NOTE — Patient Instructions (Signed)

## 2024-04-23 ENCOUNTER — Inpatient Hospital Stay

## 2024-04-23 VITALS — BP 122/52 | HR 50 | Temp 97.9°F | Resp 18

## 2024-04-23 DIAGNOSIS — D509 Iron deficiency anemia, unspecified: Secondary | ICD-10-CM

## 2024-04-23 MED ORDER — IRON SUCROSE 20 MG/ML IV SOLN
200.0000 mg | Freq: Once | INTRAVENOUS | Status: AC
  Administered 2024-04-23: 200 mg via INTRAVENOUS

## 2024-04-23 NOTE — Patient Instructions (Signed)

## 2024-04-25 ENCOUNTER — Other Ambulatory Visit: Payer: Self-pay | Admitting: *Deleted

## 2024-04-25 ENCOUNTER — Inpatient Hospital Stay

## 2024-04-25 VITALS — BP 128/55 | HR 55 | Temp 97.3°F | Resp 18

## 2024-04-25 DIAGNOSIS — D509 Iron deficiency anemia, unspecified: Secondary | ICD-10-CM

## 2024-04-25 MED ORDER — IRON SUCROSE 20 MG/ML IV SOLN
200.0000 mg | Freq: Once | INTRAVENOUS | Status: AC
  Administered 2024-04-25: 200 mg via INTRAVENOUS
  Filled 2024-04-25: qty 10

## 2024-04-25 MED ORDER — PROCHLORPERAZINE MALEATE 10 MG PO TABS: 10.0000 mg | ORAL_TABLET | Freq: Three times a day (TID) | ORAL | 0 refills | Status: AC | PRN

## 2024-04-25 NOTE — Progress Notes (Signed)
 While giving Venofer - pt stated how last doses made her violently nauseous. Dr Jacobo made aware- will order home compazine 

## 2024-04-25 NOTE — Patient Instructions (Signed)

## 2024-04-27 ENCOUNTER — Inpatient Hospital Stay

## 2024-04-27 VITALS — BP 150/54 | HR 51 | Temp 97.4°F | Resp 18

## 2024-04-27 DIAGNOSIS — D509 Iron deficiency anemia, unspecified: Secondary | ICD-10-CM

## 2024-04-27 MED ORDER — IRON SUCROSE 20 MG/ML IV SOLN
200.0000 mg | Freq: Once | INTRAVENOUS | Status: AC
  Administered 2024-04-27: 200 mg via INTRAVENOUS
  Filled 2024-04-27: qty 10

## 2024-04-27 MED ORDER — SODIUM CHLORIDE 0.9% FLUSH
10.0000 mL | Freq: Once | INTRAVENOUS | Status: AC | PRN
  Administered 2024-04-27: 10 mL
  Filled 2024-04-27: qty 10

## 2024-04-30 LAB — INTELLIGEN MYELOID

## 2024-08-06 ENCOUNTER — Encounter: Payer: Self-pay | Admitting: Oncology

## 2024-08-07 ENCOUNTER — Other Ambulatory Visit: Payer: Self-pay | Admitting: *Deleted

## 2024-08-07 ENCOUNTER — Encounter: Payer: Self-pay | Admitting: Oncology

## 2024-08-07 ENCOUNTER — Telehealth: Payer: Self-pay

## 2024-08-07 ENCOUNTER — Other Ambulatory Visit: Payer: Self-pay

## 2024-08-07 DIAGNOSIS — Z860100 Personal history of colon polyps, unspecified: Secondary | ICD-10-CM

## 2024-08-07 DIAGNOSIS — D509 Iron deficiency anemia, unspecified: Secondary | ICD-10-CM

## 2024-08-07 MED ORDER — NA SULFATE-K SULFATE-MG SULF 17.5-3.13-1.6 GM/177ML PO SOLN: 354.0000 mL | Freq: Once | ORAL | 0 refills | Status: AC

## 2024-08-07 NOTE — Telephone Encounter (Signed)
 Gastroenterology Pre-Procedure Review  Request Date: 10/08/2024 Requesting Physician: Dr. Melany  PATIENT REVIEW QUESTIONS: The patient responded to the following health history questions as indicated:    1. Are you having any GI issues? no 2. Do you have a personal history of Polyps? yes (Polyps/2000) 3. Do you have a family history of Colon Cancer or Polyps? yes (Colorectal CA/Brother) 4. Diabetes Mellitus? no 5. Joint replacements in the past 12 months?no 6. Major health problems in the past 3 months?no 7. Any artificial heart valves, MVP, or defibrillator?no    MEDICATIONS & ALLERGIES:    Patient reports the following regarding taking any anticoagulation/antiplatelet therapy:   Plavix , Coumadin, Eliquis, Xarelto, Lovenox , Pradaxa, Brilinta, or Effient? yes (PLavix  Dr. Carnella) Aspirin ? no  Patient confirms/reports the following medications:  Current Outpatient Medications  Medication Sig Dispense Refill   acetaminophen  (TYLENOL ) 500 MG tablet Take 1,000 mg by mouth every 6 (six) hours as needed for mild pain or headache.     atorvastatin  (LIPITOR) 40 MG tablet Take 1 tablet (40 mg total) by mouth daily. Take 1 tab po daily 90 tablet 3   cetirizine  (ZYRTEC ) 10 MG tablet Take 1 tablet (10 mg total) by mouth daily. 90 tablet 1   citalopram  (CELEXA ) 40 MG tablet Take 1 tablet (40 mg total) by mouth daily. 90 tablet 3   clopidogrel  (PLAVIX ) 75 MG tablet Take 75 mg by mouth daily.      diltiazem  (CARDIZEM  CD) 120 MG 24 hr capsule Take 1 capsule (120 mg total) by mouth daily. 90 capsule 3   diltiazem  (CARDIZEM ) 30 MG tablet Take 30 mg by mouth daily as needed (Heart palpitations).      gabapentin (NEURONTIN) 300 MG capsule Take 300 mg by mouth at bedtime.     isosorbide  mononitrate (IMDUR ) 30 MG 24 hr tablet TAKE 1 TABLET BY MOUTH DAILY 100 tablet 2   levothyroxine  (SYNTHROID ) 100 MCG tablet Take 1 tablet (100 mcg total) by mouth daily. 90 tablet 3   lisinopril  (ZESTRIL ) 10 MG tablet  Take 1 tablet (10 mg total) by mouth daily. 30 tablet 0   metoprolol  tartrate (LOPRESSOR ) 25 MG tablet Take 12.5 mg by mouth daily.      nitroGLYCERIN  (NITROSTAT ) 0.4 MG SL tablet Place 1 tablet (0.4 mg total) under the tongue every 5 (five) minutes as needed for chest pain. 30 tablet 2   pantoprazole  (PROTONIX ) 40 MG tablet TAKE 1 TABLET BY MOUTH TWICE  DAILY 200 tablet 2   prochlorperazine  (COMPAZINE ) 10 MG tablet Take 1 tablet (10 mg total) by mouth every 8 (eight) hours as needed for nausea or vomiting. 30 tablet 0   No current facility-administered medications for this visit.    Patient confirms/reports the following allergies:  Allergies[1]  No orders of the defined types were placed in this encounter.   AUTHORIZATION INFORMATION Primary Insurance: 1D#: Group #:  Secondary Insurance: 1D#: Group #:  SCHEDULE INFORMATION: Date: 10/08/2024 Time: Location: [facility] Dr. Carnella     [1]  Allergies Allergen Reactions   Morphine  Other (See Comments)    Hallucinations    Sulfa Antibiotics Hives   Sulfamethoxazole-Trimethoprim Swelling   Azithromycin  Diarrhea and Nausea And Vomiting    Burn stomach   Pregabalin Other (See Comments)    Pt states that she was told not to take this medication.     Carol [name] ] Hives, Nausea Only and Other (See Comments)    Burning of stomach    Rofecoxib Other (See Comments)  Burning of stomach

## 2024-08-07 NOTE — Telephone Encounter (Signed)
 Blood thinner hold request was faxed to Dr. Carnella blood thinner form faxed and awaiting for response.

## 2024-08-13 ENCOUNTER — Inpatient Hospital Stay: Attending: Oncology

## 2024-08-13 DIAGNOSIS — D509 Iron deficiency anemia, unspecified: Secondary | ICD-10-CM | POA: Insufficient documentation

## 2024-08-13 LAB — CBC WITH DIFFERENTIAL/PLATELET
Abs Immature Granulocytes: 0.04 K/uL (ref 0.00–0.07)
Basophils Absolute: 0 K/uL (ref 0.0–0.1)
Basophils Relative: 0 %
Eosinophils Absolute: 0.1 K/uL (ref 0.0–0.5)
Eosinophils Relative: 1 %
HCT: 39.8 % (ref 36.0–46.0)
Hemoglobin: 12.9 g/dL (ref 12.0–15.0)
Immature Granulocytes: 1 %
Lymphocytes Relative: 17 %
Lymphs Abs: 1.5 K/uL (ref 0.7–4.0)
MCH: 32.5 pg (ref 26.0–34.0)
MCHC: 32.4 g/dL (ref 30.0–36.0)
MCV: 100.3 fL — ABNORMAL HIGH (ref 80.0–100.0)
Monocytes Absolute: 0.5 K/uL (ref 0.1–1.0)
Monocytes Relative: 6 %
Neutro Abs: 6.5 K/uL (ref 1.7–7.7)
Neutrophils Relative %: 75 %
Platelets: 172 K/uL (ref 150–400)
RBC: 3.97 MIL/uL (ref 3.87–5.11)
RDW: 12 % (ref 11.5–15.5)
WBC: 8.7 K/uL (ref 4.0–10.5)
nRBC: 0 % (ref 0.0–0.2)

## 2024-08-13 LAB — IRON AND TIBC
Iron: 70 ug/dL (ref 28–170)
Saturation Ratios: 23 % (ref 10.4–31.8)
TIBC: 311 ug/dL (ref 250–450)
UIBC: 241 ug/dL

## 2024-08-13 LAB — FERRITIN: Ferritin: 255 ng/mL (ref 11–307)

## 2024-08-14 ENCOUNTER — Inpatient Hospital Stay

## 2024-08-14 ENCOUNTER — Inpatient Hospital Stay: Admitting: Oncology

## 2024-09-04 NOTE — Telephone Encounter (Addendum)
 Patient has an appointment with Dr. Ammon on 09/27/2024 for cardiac clearance and blood thinner hold. Colonoscopy Dr. Melany 10/08/2024 [facility]

## 2024-10-08 ENCOUNTER — Ambulatory Visit: Admit: 2024-10-08 | Admitting: Gastroenterology

## 2024-10-08 SURGERY — COLONOSCOPY
Anesthesia: General

## 2024-11-05 ENCOUNTER — Inpatient Hospital Stay

## 2024-11-06 ENCOUNTER — Inpatient Hospital Stay: Admitting: Oncology

## 2024-11-06 ENCOUNTER — Inpatient Hospital Stay
